# Patient Record
Sex: Male | Born: 1990
Health system: Southern US, Community
[De-identification: ages and names within clinical notes are randomized; demographics above are authoritative.]

## PROBLEM LIST (undated history)

## (undated) ENCOUNTER — Emergency Department (HOSPITAL_BASED_OUTPATIENT_CLINIC_OR_DEPARTMENT_OTHER): Payer: 59 | Source: Home / Self Care

## (undated) DIAGNOSIS — D571 Sickle-cell disease without crisis: Secondary | ICD-10-CM

## (undated) DIAGNOSIS — Z79891 Long term (current) use of opiate analgesic: Secondary | ICD-10-CM

## (undated) HISTORY — PX: CHOLECYSTECTOMY: SHX55

---
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
## 2009-09-14 ENCOUNTER — Emergency Department (HOSPITAL_BASED_OUTPATIENT_CLINIC_OR_DEPARTMENT_OTHER): Admission: EM | Admit: 2009-09-14 | Discharge: 2009-09-14 | Payer: Self-pay | Admitting: Emergency Medicine

## 2010-01-29 ENCOUNTER — Inpatient Hospital Stay (HOSPITAL_COMMUNITY): Admission: EM | Admit: 2010-01-29 | Discharge: 2010-02-05 | Payer: Self-pay | Admitting: Emergency Medicine

## 2010-01-29 ENCOUNTER — Ambulatory Visit: Payer: Self-pay | Admitting: Cardiology

## 2010-02-03 ENCOUNTER — Encounter (INDEPENDENT_AMBULATORY_CARE_PROVIDER_SITE_OTHER): Payer: Self-pay | Admitting: Internal Medicine

## 2010-07-08 ENCOUNTER — Emergency Department (HOSPITAL_BASED_OUTPATIENT_CLINIC_OR_DEPARTMENT_OTHER)
Admission: EM | Admit: 2010-07-08 | Discharge: 2010-07-08 | Payer: Self-pay | Source: Home / Self Care | Admitting: Emergency Medicine

## 2010-07-20 ENCOUNTER — Emergency Department (HOSPITAL_BASED_OUTPATIENT_CLINIC_OR_DEPARTMENT_OTHER)
Admission: EM | Admit: 2010-07-20 | Discharge: 2010-07-20 | Disposition: A | Discharge status: Home / Self Care | Payer: Self-pay | Attending: Emergency Medicine | Admitting: Emergency Medicine

## 2010-07-20 DIAGNOSIS — M79609 Pain in unspecified limb: Secondary | ICD-10-CM | POA: Insufficient documentation

## 2010-07-20 DIAGNOSIS — D5700 Hb-SS disease with crisis, unspecified: Secondary | ICD-10-CM | POA: Insufficient documentation

## 2010-07-20 LAB — RETICULOCYTES: Retic Ct Pct: 4.9 % — ABNORMAL HIGH (ref 0.4–3.1)

## 2010-07-20 LAB — DIFFERENTIAL
Eosinophils Absolute: 0 10*3/uL (ref 0.0–0.7)
Eosinophils Relative: 0 % (ref 0–5)
Lymphocytes Relative: 18 % (ref 12–46)
Lymphs Abs: 2.1 10*3/uL (ref 0.7–4.0)

## 2010-07-20 LAB — CBC
HCT: 33.8 % — ABNORMAL LOW (ref 39.0–52.0)
MCH: 27.7 pg (ref 26.0–34.0)
MCV: 76.6 fL — ABNORMAL LOW (ref 78.0–100.0)
RBC: 4.41 MIL/uL (ref 4.22–5.81)
RDW: 15.2 % (ref 11.5–15.5)

## 2010-07-20 LAB — BASIC METABOLIC PANEL
BUN: 4 mg/dL — ABNORMAL LOW (ref 6–23)
CO2: 24 mEq/L (ref 19–32)
Calcium: 9.5 mg/dL (ref 8.4–10.5)
Chloride: 106 mEq/L (ref 96–112)
Creatinine, Ser: 0.8 mg/dL (ref 0.4–1.5)
Potassium: 4.2 mEq/L (ref 3.5–5.1)
Sodium: 142 mEq/L (ref 135–145)

## 2010-08-27 LAB — TYPE AND SCREEN
ABO/RH(D): O POS
Donor AG Type: NEGATIVE

## 2010-08-27 LAB — CBC
HCT: 21.3 % — ABNORMAL LOW (ref 39.0–52.0)
HCT: 24.2 % — ABNORMAL LOW (ref 39.0–52.0)
HCT: 28.7 % — ABNORMAL LOW (ref 39.0–52.0)
HCT: 30.8 % — ABNORMAL LOW (ref 39.0–52.0)
Hemoglobin: 10.4 g/dL — ABNORMAL LOW (ref 13.0–17.0)
Hemoglobin: 11.3 g/dL — ABNORMAL LOW (ref 13.0–17.0)
Hemoglobin: 7.6 g/dL — ABNORMAL LOW (ref 13.0–17.0)
Hemoglobin: 8.7 g/dL — ABNORMAL LOW (ref 13.0–17.0)
Hemoglobin: 8.7 g/dL — ABNORMAL LOW (ref 13.0–17.0)
Hemoglobin: 9.7 g/dL — ABNORMAL LOW (ref 13.0–17.0)
MCH: 26.9 pg (ref 26.0–34.0)
MCH: 27.2 pg (ref 26.0–34.0)
MCH: 27.6 pg (ref 26.0–34.0)
MCH: 27.9 pg (ref 26.0–34.0)
MCHC: 35.7 g/dL (ref 30.0–36.0)
MCHC: 36.1 g/dL — ABNORMAL HIGH (ref 30.0–36.0)
MCHC: 36.2 g/dL — ABNORMAL HIGH (ref 30.0–36.0)
MCHC: 36.4 g/dL — ABNORMAL HIGH (ref 30.0–36.0)
MCHC: 36.4 g/dL — ABNORMAL HIGH (ref 30.0–36.0)
MCHC: 36.5 g/dL — ABNORMAL HIGH (ref 30.0–36.0)
MCV: 74.3 fL — ABNORMAL LOW (ref 78.0–100.0)
MCV: 76.5 fL — ABNORMAL LOW (ref 78.0–100.0)
Platelets: 114 10*3/uL — ABNORMAL LOW (ref 150–400)
Platelets: 122 10*3/uL — ABNORMAL LOW (ref 150–400)
Platelets: 162 10*3/uL (ref 150–400)
Platelets: 213 10*3/uL (ref 150–400)
Platelets: 261 10*3/uL (ref 150–400)
RBC: 3.17 MIL/uL — ABNORMAL LOW (ref 4.22–5.81)
RBC: 3.35 MIL/uL — ABNORMAL LOW (ref 4.22–5.81)
RBC: 3.75 MIL/uL — ABNORMAL LOW (ref 4.22–5.81)
RBC: 4.01 MIL/uL — ABNORMAL LOW (ref 4.22–5.81)
RBC: 4.1 MIL/uL — ABNORMAL LOW (ref 4.22–5.81)
RDW: 15.2 % (ref 11.5–15.5)
RDW: 15.6 % — ABNORMAL HIGH (ref 11.5–15.5)
WBC: 15.4 10*3/uL — ABNORMAL HIGH (ref 4.0–10.5)
WBC: 17.9 10*3/uL — ABNORMAL HIGH (ref 4.0–10.5)
WBC: 8.6 10*3/uL (ref 4.0–10.5)

## 2010-08-27 LAB — COMPREHENSIVE METABOLIC PANEL
ALT: 37 U/L (ref 0–53)
ALT: 42 U/L (ref 0–53)
ALT: 49 U/L (ref 0–53)
AST: 46 U/L — ABNORMAL HIGH (ref 0–37)
AST: 62 U/L — ABNORMAL HIGH (ref 0–37)
Albumin: 2.6 g/dL — ABNORMAL LOW (ref 3.5–5.2)
Albumin: 2.6 g/dL — ABNORMAL LOW (ref 3.5–5.2)
Albumin: 2.8 g/dL — ABNORMAL LOW (ref 3.5–5.2)
Alkaline Phosphatase: 141 U/L — ABNORMAL HIGH (ref 39–117)
Alkaline Phosphatase: 156 U/L — ABNORMAL HIGH (ref 39–117)
BUN: 4 mg/dL — ABNORMAL LOW (ref 6–23)
CO2: 26 mEq/L (ref 19–32)
CO2: 28 mEq/L (ref 19–32)
Calcium: 8.4 mg/dL (ref 8.4–10.5)
Calcium: 8.4 mg/dL (ref 8.4–10.5)
Calcium: 8.5 mg/dL (ref 8.4–10.5)
Chloride: 104 mEq/L (ref 96–112)
Chloride: 104 mEq/L (ref 96–112)
Creatinine, Ser: 0.76 mg/dL (ref 0.4–1.5)
Creatinine, Ser: 0.83 mg/dL (ref 0.4–1.5)
GFR calc Af Amer: >60 mL/min (ref >60)
GFR calc Af Amer: >60 mL/min (ref >60)
GFR calc non Af Amer: >60 mL/min (ref >60)
GFR calc non Af Amer: >60 mL/min (ref >60)
GFR calc non Af Amer: >60 mL/min (ref >60)
Glucose, Bld: 100 mg/dL — ABNORMAL HIGH (ref 70–99)
Glucose, Bld: 118 mg/dL — ABNORMAL HIGH (ref 70–99)
Glucose, Bld: 97 mg/dL (ref 70–99)
Potassium: 3.7 mEq/L (ref 3.5–5.1)
Sodium: 137 mEq/L (ref 135–145)
Sodium: 138 mEq/L (ref 135–145)
Sodium: 138 mEq/L (ref 135–145)
Total Bilirubin: 1.7 mg/dL — ABNORMAL HIGH (ref 0.3–1.2)
Total Bilirubin: 1.7 mg/dL — ABNORMAL HIGH (ref 0.3–1.2)
Total Protein: 6.3 g/dL (ref 6.0–8.3)

## 2010-08-27 LAB — URINE CULTURE
Colony Count: NO GROWTH
Culture  Setup Time: 201108202109
Culture: NO GROWTH

## 2010-08-27 LAB — BASIC METABOLIC PANEL
BUN: 5 mg/dL — ABNORMAL LOW (ref 6–23)
CO2: 25 mEq/L (ref 19–32)
Chloride: 106 mEq/L (ref 96–112)
Creatinine, Ser: 0.76 mg/dL (ref 0.4–1.5)
GFR calc Af Amer: >60 mL/min (ref >60)
Glucose, Bld: 113 mg/dL — ABNORMAL HIGH (ref 70–99)
Glucose, Bld: 113 mg/dL — ABNORMAL HIGH (ref 70–99)
Potassium: 3.9 mEq/L (ref 3.5–5.1)
Sodium: 133 mEq/L — ABNORMAL LOW (ref 135–145)
Sodium: 136 mEq/L (ref 135–145)
Sodium: 137 mEq/L (ref 135–145)

## 2010-08-27 LAB — CULTURE, BLOOD (ROUTINE X 2)
Culture: NO GROWTH
Culture: NO GROWTH

## 2010-08-27 LAB — RAPID URINE DRUG SCREEN, HOSP PERFORMED
Benzodiazepines: NOT DETECTED
Opiates: NOT DETECTED
Tetrahydrocannabinol: POSITIVE — AB

## 2010-08-27 LAB — CARDIAC PANEL(CRET KIN+CKTOT+MB+TROPI)
CK, MB: 0.4 ng/mL (ref 0.3–4.0)
CK, MB: 0.5 ng/mL (ref 0.3–4.0)
CK, MB: 0.6 ng/mL (ref 0.3–4.0)
Relative Index: INVALID (ref 0.0–2.5)
Total CK: 79 U/L (ref 7–232)
Troponin I: 0.05 ng/mL (ref 0.00–0.06)

## 2010-08-27 LAB — RETICULOCYTES
RBC.: 3.44 MIL/uL — ABNORMAL LOW (ref 4.22–5.81)
RBC.: 3.92 MIL/uL — ABNORMAL LOW (ref 4.22–5.81)
RBC.: 4.02 MIL/uL — ABNORMAL LOW (ref 4.22–5.81)
Retic Count, Absolute: 180 10*3/uL (ref 19.0–186.0)
Retic Count, Absolute: 188.9 10*3/uL — ABNORMAL HIGH (ref 19.0–186.0)
Retic Ct Pct: 4 % — ABNORMAL HIGH (ref 0.4–3.1)
Retic Ct Pct: 4.3 % — ABNORMAL HIGH (ref 0.4–3.1)
Retic Ct Pct: 5.7 % — ABNORMAL HIGH (ref 0.4–3.1)

## 2010-08-27 LAB — URINALYSIS, ROUTINE W REFLEX MICROSCOPIC
Glucose, UA: NEGATIVE mg/dL
Hgb urine dipstick: NEGATIVE
Urobilinogen, UA: 0.2 mg/dL (ref 0.0–1.0)
pH: 7 (ref 5.0–8.0)

## 2010-08-27 LAB — FERRITIN: Ferritin: 4510 ng/mL — ABNORMAL HIGH (ref 22–322)

## 2010-08-27 LAB — DIFFERENTIAL
Basophils Absolute: 0.1 10*3/uL (ref 0.0–0.1)
Basophils Relative: 1 % (ref 0–1)
Eosinophils Absolute: 0.2 10*3/uL (ref 0.0–0.7)
Lymphocytes Relative: 43 % (ref 12–46)
Monocytes Absolute: 1.3 10*3/uL — ABNORMAL HIGH (ref 0.1–1.0)
Monocytes Relative: 11 % (ref 3–12)

## 2010-08-27 LAB — IRON AND TIBC
Saturation Ratios: 23 % (ref 20–55)
TIBC: 180 ug/dL — ABNORMAL LOW (ref 215–435)

## 2010-08-27 LAB — BILIRUBIN, FRACTIONATED(TOT/DIR/INDIR)
Bilirubin, Direct: 0.5 mg/dL — ABNORMAL HIGH (ref 0.0–0.3)
Indirect Bilirubin: 1.1 mg/dL — ABNORMAL HIGH (ref 0.3–0.9)

## 2010-08-27 LAB — FOLATE: Folate: 9.3 ng/mL

## 2010-09-02 LAB — DIFFERENTIAL
Basophils Absolute: 0 10*3/uL (ref 0.0–0.1)
Lymphs Abs: 2.8 10*3/uL (ref 0.7–4.0)
Monocytes Absolute: 0.7 10*3/uL (ref 0.1–1.0)
Monocytes Relative: 10 % (ref 3–12)
Neutro Abs: 3.3 10*3/uL (ref 1.7–7.7)

## 2010-09-02 LAB — CBC
HCT: 37.1 % — ABNORMAL LOW (ref 39.0–52.0)
MCHC: 33.5 g/dL (ref 30.0–36.0)
MCV: 86.3 fL (ref 78.0–100.0)
Platelets: 142 10*3/uL — ABNORMAL LOW (ref 150–400)
WBC: 6.9 10*3/uL (ref 4.0–10.5)

## 2010-10-30 NOTE — Discharge Summary (Signed)
William Jennings, William Jennings                        ACCOUNT NO.:  [id number]   MEDICAL RECORD NO.:  [id number]                   PATIENT TYPE:  INP   LOCATION:  6153                                 FACILITY:  [facility]   PHYSICIAN:  Orie Rout, M.D.            DATE OF BIRTH:  [birth year]   DATE OF ADMISSION:  06/30/2003  DATE OF DISCHARGE:  07/09/2003                                 DISCHARGE SUMMARY   DISCHARGE DIAGNOSES:  1. Hemoglobin sickle cell disease.  2. Cervical lymphadenopathy.   DISCHARGE MEDICATIONS:  1. Augmentin 600 ES one p.o. t.i.d. for 10 days.  2. Motrin 400 mg one p.o. q.6h. p.r.n. pain.  3. Tylenol #3 one to two tablets p.o. q.4-6h. p.r.n. severe pain.  4. Hydroxyurea 750 mg one p.o. q.d.   DISPOSITION:  Discharged to home.   FOLLOW UP:  Followup will be at [facility] on Tuesday, February 1  at 3:30 p.m. and at [facility] on August 06, 2003 at 9:30  a.m.   PROCEDURE PERFORMED:  CT scan of the neck showing enlarged posterior chain  lymph nodes, significant thickening of the posterior nasopharyngeal wall  _________ and adenoids. Findings are nonspecific and could be related to  inflammation or lymphoma. He also had a chest x-ray which showed no evidence  for acute cardiopulmonary disease, typical sickle cell changes within the  vertebral bodies, and contrast in the renal collecting system secondary to  his prior CT scan. He also had a PPD placed which was read as negative  during this admission.   CONSULTATIONS:  Nutrition.   CHIEF COMPLAINT:  Lymphadenopathy.   HISTORY OF PRESENT ILLNESS:  This is a 20 year old African-American male  with hemoglobin Wayland disease presenting with a three-day history of swollen  cervical lymph nodes. In addition, the patient reports feeling much more  fatigued and normal for one week prior to admission, stating he would sleep  all day and had no energy which is a very unusual state for him. The  patient  denies any pain in his back or extremities. He also denies any respiratory  symptoms. The mother reports temperature to 100.3 but no other symptoms  other than his neck was tender and sore from the adenitis.   PAST MEDICAL HISTORY:  Hemoglobin Munson disease, last pain crisis approximately  one year prior.   PAST SURGICAL HISTORY:  Cholecystectomy in 2000.   FAMILY HISTORY:  Mom with sickle cell trait, sister with sickle cell  disease. Otherwise negative.   SOCIAL HISTORY:  Lives with mom and 52-year-old sister. No tobacco, no pets.  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   MEDICATIONS:  Hydroxyurea 750 mg q.d.   ALLERGIES:  No known drug allergies.   PHYSICAL EXAMINATION:  VITAL SIGNS:  Temperature is 98.4, blood pressure  109.62, heart rate 114, respiratory rate 20.  GENERAL:  Pleasant, appropriate, African-American male in no acute distress.  HEENT:  Normocephalic,  atraumatic. Anicteric sclerae. Noninjected  conjunctivae. TMs clear with a good light reflex bilaterally. OP with  tonsillar hypertrophy and erythema, no exudates. Mucous membranes moist.  NECK:  2 x 3 cm posterior cervical lymphadenopathy, bilaterally very tender,  does not want to move his neck due to tender lymphadenopathy __________ but  not consistent with meningitis.  CHEST:  Clear to auscultation bilaterally.  CARDIOVASCULAR:  Regular rate and rhythm.  ABDOMEN:  Splenomegaly. Spleen edge palpable 3 cm below the left costal  margin. No hepatomegaly. Soft, nontender, nondistended with normal active  bowel sounds.  GROIN:  Bilateral inguinal lymphadenopathy. Mobile nodes are noted.  EXTREMITIES:  Normal capillary refill. No clubbing, cyanosis, or edema. No  tenderness. 2+ DP pulses bilaterally.  SKIN:  No rashes noted.  MUSCULOSKELETAL:  No sternal, costovertebral, or CVA tenderness noted.   LABORATORY DATA:  Rapid strep is negative. Mono spot is negative. Retic  count is 3.6%. CBC:  White count  is 10, H and H 10 and 29.7, platelets 207,  neutrophils 66, lymphocytes 21, monocytes 11, absolute monocytes 1.1. He has  had a blood culture that was collected which was negative. A CMV and AVV  titer showing prior exposure but not current infection.   HOSPITAL COURSE:  Child was admitted for likely mononucleosis, although  after the results became clear that he did not currently have mononucleosis,  he was given pain control, and CT scan was performed. It was decided that a  biopsy should be done, but at the time that we brought this up to mother,  she remembered that on January 4 he had fairly extensive dental work  performed and had been fatigued and not quite himself since that point in  time. He was started on Unasyn three days prior to discharge and improved a  great deal. His lymphadenopathy had decreased a great deal to the point  where he had only shotty cervical lymph nodes in the posterior triangle. He  was changed to Augmentin and will be continued on this for 10 days after  discharge. A CBC the day prior to discharge showed a white count of 4.4, H  and H of 8.9 and 26.1, platelets of 237 with a normal differential. Other  labs have already been previously noted. The patient was discharged in  improved condition on a regular diet and was instructed to return to school  the day after his discharge.      Ursula Beath, MD                     Orie Rout, M.D.    JT/MEDQ  D:  07/09/2003  T:  07/10/2003  Job:  [id number]   cc:   Joycelyn Man, M.D.  [facility] Hematology/Oncology   [facility]

## 2010-10-30 NOTE — Discharge Summary (Signed)
NAMEEZRAEL, SAM NO.:  [id number]   MEDICAL RECORD NO.:  [id number]                   PATIENT TYPE:  INP   LOCATION:  6151                                 FACILITY:  [facility]   PHYSICIAN:  Rutherford Nail                          DATE OF BIRTH:  June 23, 1990   DATE OF ADMISSION:  04/02/2002  DATE OF DISCHARGE:  04/05/2002                                 DISCHARGE SUMMARY   FINAL DIAGNOSIS:  Sickle cell pain crisis.   HISTORY OF PRESENT ILLNESS:  The patient is a 20 year old African-American  male admitted with pain in his left leg.  The patient  states the pain is  similar to previous pain crises, but that this is the first time he has had  it on his left side.  His pain is normally in his right leg and hip. The  patient was initially tried on OxyContin in the ER, but stated that this did  not relieve his pain.  He was admitted for IV morphine treatment.   LABORATORY DATA:  The patient's initial CBC had a white count of 4.3,  hemoglobin 8.4, hematocrit 24.6, platelets 120.  The second hospital day his  white count had dropped to 2.5 and platelets were 97.  His reticulocyte  count was 2.5% .  On the third hospital day, the patient's white count had  improved to 3.7 and platelets had improved to 120.  The patient  had a blood  culture which was negative x48 hours at the time of discharge.   HOSPITAL COURSE BY SYSTEM:  1. Pain crisis.  The patient was initially started on IV morphine and then     advanced to a morphine PCA.  He was continued on this for two days and     then switched to oral Tylenol No. 3.  He was discharged home with     prescriptions for Percocet and Tylenol No. 3.  2. Hematology.  The patient  was noted to have leukopenia and     thrombocytopenia on hospital day two.  It was felt that this could be     possibly be due to his hydroxyurea.  However, recheck the next day showed     improvement of this so he was continued on his  hydroxyurea at discharge.   DISCHARGE MEDICATIONS:  1. Hydroxyurea 700 mg p.o. q.a.m.  2. Tylenol No. 3 15 ml p.o. q.4h p.r.n.  pain.  3. Percocet 325/5 one tablet p.o. q.4-6h p.r.n.  pain.   FOLLOW UP:  The patient is to follow up at [facility].  They are  to return to medical attention should his pain not be controlled or should  he have difficulty eating or drinking.  Rutherford Nail    LS/MEDQ  D:  04/06/2002  T:  04/08/2002  Job:  [id number]

## 2010-11-28 ENCOUNTER — Emergency Department (HOSPITAL_BASED_OUTPATIENT_CLINIC_OR_DEPARTMENT_OTHER)
Admission: EM | Admit: 2010-11-28 | Discharge: 2010-11-29 | Disposition: A | Discharge status: Home / Self Care | Payer: Self-pay | Attending: Emergency Medicine | Admitting: Emergency Medicine

## 2010-11-28 DIAGNOSIS — H9190 Unspecified hearing loss, unspecified ear: Secondary | ICD-10-CM | POA: Insufficient documentation

## 2010-11-28 DIAGNOSIS — D571 Sickle-cell disease without crisis: Secondary | ICD-10-CM | POA: Insufficient documentation

## 2011-03-11 LAB — CBC
HCT: 34.7 — ABNORMAL LOW
Hemoglobin: 11.6 — ABNORMAL LOW
MCHC: 33.4
Platelets: 229
RDW: 15.2

## 2011-03-11 LAB — DIFFERENTIAL
Basophils Relative: 0
Eosinophils Absolute: 0
Monocytes Absolute: 1.9 — ABNORMAL HIGH
Neutro Abs: 24 — ABNORMAL HIGH
Neutrophils Relative %: 87 — ABNORMAL HIGH

## 2011-04-26 ENCOUNTER — Encounter: Payer: Self-pay | Admitting: Family Medicine

## 2011-04-26 ENCOUNTER — Emergency Department (HOSPITAL_BASED_OUTPATIENT_CLINIC_OR_DEPARTMENT_OTHER)
Admission: EM | Admit: 2011-04-26 | Discharge: 2011-04-26 | Disposition: A | Discharge status: Home / Self Care | Payer: Self-pay | Attending: Emergency Medicine | Admitting: Emergency Medicine

## 2011-04-26 DIAGNOSIS — D5700 Hb-SS disease with crisis, unspecified: Secondary | ICD-10-CM | POA: Insufficient documentation

## 2011-04-26 DIAGNOSIS — M79609 Pain in unspecified limb: Secondary | ICD-10-CM | POA: Insufficient documentation

## 2011-04-26 HISTORY — DX: Sickle-cell disease without crisis: D57.1

## 2011-04-26 LAB — CBC
HCT: 34.4 % — ABNORMAL LOW (ref 39.0–52.0)
Hemoglobin: 12.9 g/dL — ABNORMAL LOW (ref 13.0–17.0)
MCH: 27.9 pg (ref 26.0–34.0)
MCHC: 37.5 g/dL — ABNORMAL HIGH (ref 30.0–36.0)
MCV: 74.3 fL — ABNORMAL LOW (ref 78.0–100.0)
Platelets: 161 10*3/uL (ref 150–400)
RBC: 4.63 MIL/uL (ref 4.22–5.81)
RDW: 14.4 % (ref 11.5–15.5)
WBC: 8.1 10*3/uL (ref 4.0–10.5)

## 2011-04-26 LAB — BASIC METABOLIC PANEL
BUN: 7 mg/dL (ref 6–23)
CO2: 26 mEq/L (ref 19–32)
Calcium: 9.8 mg/dL (ref 8.4–10.5)
Chloride: 104 mEq/L (ref 96–112)
Creatinine, Ser: 0.7 mg/dL (ref 0.50–1.35)
GFR calc Af Amer: >90 mL/min (ref >90)
GFR calc non Af Amer: >90 mL/min (ref >90)
Glucose, Bld: 110 mg/dL — ABNORMAL HIGH (ref 70–99)
Potassium: 4.1 mEq/L (ref 3.5–5.1)
Sodium: 138 mEq/L (ref 135–145)

## 2011-04-26 LAB — RETICULOCYTES
RBC.: 4.59 MIL/uL (ref 4.22–5.81)
Retic Count, Absolute: 211.1 10*3/uL — ABNORMAL HIGH (ref 19.0–186.0)
Retic Ct Pct: 4.6 % — ABNORMAL HIGH (ref 0.4–3.1)

## 2011-04-26 MED ORDER — HYDROMORPHONE HCL PF 1 MG/ML IJ SOLN
1.0000 mg | Freq: Once | INTRAMUSCULAR | Status: AC
  Administered 2011-04-26: 1 mg via INTRAVENOUS
  Filled 2011-04-26: qty 1

## 2011-04-26 MED ORDER — SODIUM CHLORIDE 0.9 % IV BOLUS (SEPSIS)
1000.0000 mL | Freq: Once | INTRAVENOUS | Status: AC
  Administered 2011-04-26: 1000 mL via INTRAVENOUS

## 2011-04-26 MED ORDER — OXYCODONE-ACETAMINOPHEN 5-325 MG PO TABS: 1.0000 | ORAL_TABLET | ORAL | Status: AC | PRN

## 2011-04-26 MED ORDER — KETOROLAC TROMETHAMINE 15 MG/ML IJ SOLN
15.0000 mg | Freq: Once | INTRAMUSCULAR | Status: AC
  Administered 2011-04-26: 15 mg via INTRAVENOUS
  Filled 2011-04-26: qty 1

## 2011-04-26 MED ORDER — ONDANSETRON HCL 4 MG/2ML IJ SOLN
4.0000 mg | Freq: Once | INTRAMUSCULAR | Status: AC
  Administered 2011-04-26: 4 mg via INTRAVENOUS
  Filled 2011-04-26: qty 2

## 2011-04-26 NOTE — ED Notes (Addendum)
Pt sts "I'm having a sickle cell crisis". Pt c/o pain to right leg mostly anterior x 4 days. Pt denies shob, n/v, fever. Pt sts he took ibuprofen at home without relief.

## 2011-04-30 NOTE — ED Provider Notes (Signed)
History    20yM with RLE pain which he attributes to ss crisis. No trauma. Gradual onset about 4d ago. R shin and thigh. No fever or chills. No n/v. No cp or sob. Has been taking ibuprofen at home without much relief.   CSN: [id number] Arrival date & time: 04/26/2011  8:21 AM   First MD Initiated Contact with Patient 04/26/11 0840      Chief Complaint  Patient presents with  . Sickle Cell Pain Crisis    (Consider location/radiation/quality/duration/timing/severity/associated sxs/prior treatment) HPI  Past Medical History  Diagnosis Date  . Sickle cell disease     History reviewed. No pertinent past surgical history.  No family history on file.  History  Substance Use Topics  . Smoking status: Never Smoker   . Smokeless tobacco: Not on file  . Alcohol Use: Yes      Review of Systems   Review of symptoms negative unless otherwise noted in HPI.  Allergies  Shellfish allergy  Home Medications   Current Outpatient Rx  Name Route Sig Dispense Refill  . IBUPROFEN 200 MG PO TABS Oral Take 600 mg by mouth every 6 (six) hours as needed. pain     . NAPROXEN SODIUM 220 MG PO TABS Oral Take 220 mg by mouth 2 (two) times daily with a meal. For pain     . OXYCODONE-ACETAMINOPHEN 5-325 MG PO TABS Oral Take 1 tablet by mouth every 4 (four) hours as needed for pain. 20 tablet 0    BP 128/69  Pulse 53  Temp(Src) 98 F (36.7 C) (Oral)  Resp 20  Ht 5\' 8"  (1.727 m)  Wt 145 lb (65.772 kg)  BMI 22.05 kg/m2  SpO2 100%  Physical Exam  Nursing note and vitals reviewed. Constitutional: He appears well-developed and well-nourished. No distress.  HENT:  Head: Normocephalic and atraumatic.  Eyes: Conjunctivae are normal. Right eye exhibits no discharge. Left eye exhibits no discharge.  Neck: Normal range of motion. Neck supple.  Cardiovascular: Normal rate, regular rhythm and normal heart sounds.  Exam reveals no gallop and no friction rub.   No murmur heard. Pulmonary/Chest:  Effort normal and breath sounds normal. No respiratory distress. He has no wheezes. He has no rales. He exhibits no tenderness.  Abdominal: Soft. He exhibits no distension. There is no tenderness.  Musculoskeletal: He exhibits no edema and no tenderness.       No rash. No significant tenderness of extremities.  Neurological: He is alert.  Skin: Skin is warm and dry.  Psychiatric: He has a normal mood and affect. His behavior is normal. Thought content normal.    ED Course  Procedures (including critical care time)  Labs Reviewed  CBC - Abnormal; Notable for the following:    Hemoglobin 12.9 (*)    HCT 34.4 (*)    MCV 74.3 (*)    MCHC 37.5 (*)    All other components within normal limits  RETICULOCYTES - Abnormal; Notable for the following:    Retic Ct Pct 4.6 (*)    Retic Count, Manual 211.1 (*)    All other components within normal limits  BASIC METABOLIC PANEL - Abnormal; Notable for the following:    Glucose, Bld 110 (*)    All other components within normal limits   No results found.   1. Sickle cell pain crisis       MDM  20yM with RLE pain. Pain improved with meds. Dc with script. No fever or chills. No clinical concern  for acute chanest. Pt clinically appears well. DC with outpt fu.        Raeford Razor, MD 05/02/11 [phone]

## 2011-05-07 ENCOUNTER — Inpatient Hospital Stay (HOSPITAL_BASED_OUTPATIENT_CLINIC_OR_DEPARTMENT_OTHER)
Admission: EM | Admit: 2011-05-07 | Discharge: 2011-05-13 | DRG: 193 | Disposition: A | Discharge status: Home / Self Care | Payer: Self-pay | Attending: Family Medicine | Admitting: Family Medicine

## 2011-05-07 ENCOUNTER — Encounter (HOSPITAL_BASED_OUTPATIENT_CLINIC_OR_DEPARTMENT_OTHER): Payer: Self-pay

## 2011-05-07 ENCOUNTER — Emergency Department (INDEPENDENT_AMBULATORY_CARE_PROVIDER_SITE_OTHER): Payer: Self-pay

## 2011-05-07 DIAGNOSIS — J189 Pneumonia, unspecified organism: Principal | ICD-10-CM | POA: Diagnosis present

## 2011-05-07 DIAGNOSIS — D5701 Hb-SS disease with acute chest syndrome: Secondary | ICD-10-CM | POA: Diagnosis present

## 2011-05-07 DIAGNOSIS — R5081 Fever presenting with conditions classified elsewhere: Secondary | ICD-10-CM

## 2011-05-07 DIAGNOSIS — D571 Sickle-cell disease without crisis: Secondary | ICD-10-CM

## 2011-05-07 DIAGNOSIS — R509 Fever, unspecified: Secondary | ICD-10-CM

## 2011-05-07 DIAGNOSIS — J069 Acute upper respiratory infection, unspecified: Secondary | ICD-10-CM

## 2011-05-07 DIAGNOSIS — R0902 Hypoxemia: Secondary | ICD-10-CM | POA: Diagnosis present

## 2011-05-07 DIAGNOSIS — D5700 Hb-SS disease with crisis, unspecified: Secondary | ICD-10-CM

## 2011-05-07 DIAGNOSIS — D57819 Other sickle-cell disorders with crisis, unspecified: Secondary | ICD-10-CM | POA: Diagnosis present

## 2011-05-07 LAB — CBC
MCH: 28 pg (ref 26.0–34.0)
MCHC: 37 g/dL — ABNORMAL HIGH (ref 30.0–36.0)
Platelets: 94 10*3/uL — ABNORMAL LOW (ref 150–400)
RBC: 4.07 MIL/uL — ABNORMAL LOW (ref 4.22–5.81)
RDW: 14 % (ref 11.5–15.5)

## 2011-05-07 LAB — BASIC METABOLIC PANEL
BUN: 9 mg/dL (ref 6–23)
CO2: 22 mEq/L (ref 19–32)
Chloride: 102 mEq/L (ref 96–112)
GFR calc non Af Amer: >90 mL/min (ref >90)
Glucose, Bld: 92 mg/dL (ref 70–99)
Potassium: 3.6 mEq/L (ref 3.5–5.1)

## 2011-05-07 LAB — RETICULOCYTES
RBC.: 4.08 MIL/uL — ABNORMAL LOW (ref 4.22–5.81)
Retic Count, Absolute: 114.2 10*3/uL (ref 19.0–186.0)

## 2011-05-07 LAB — DIFFERENTIAL
Basophils Absolute: 0 10*3/uL (ref 0.0–0.1)
Eosinophils Absolute: 0 10*3/uL (ref 0.0–0.7)
Monocytes Absolute: 1.1 10*3/uL — ABNORMAL HIGH (ref 0.1–1.0)
Neutrophils Relative %: 80 % — ABNORMAL HIGH (ref 43–77)

## 2011-05-07 MED ORDER — ONDANSETRON HCL 4 MG/2ML IJ SOLN: 4.0000 mg | Freq: Four times a day (QID) | INTRAMUSCULAR | Status: DC | PRN

## 2011-05-07 MED ORDER — DOCUSATE SODIUM 100 MG PO CAPS
100.0000 mg | ORAL_CAPSULE | Freq: Two times a day (BID) | ORAL | Status: DC
  Administered 2011-05-08 – 2011-05-12 (×8): 100 mg via ORAL
  Filled 2011-05-07 (×13): qty 1

## 2011-05-07 MED ORDER — ONDANSETRON HCL 4 MG PO TABS: 4.0000 mg | ORAL_TABLET | Freq: Four times a day (QID) | ORAL | Status: DC | PRN

## 2011-05-07 MED ORDER — HYDROCODONE-ACETAMINOPHEN 5-325 MG PO TABS: 1.0000 | ORAL_TABLET | ORAL | Status: DC | PRN

## 2011-05-07 MED ORDER — ENOXAPARIN SODIUM 40 MG/0.4ML ~~LOC~~ SOLN
40.0000 mg | Freq: Every day | SUBCUTANEOUS | Status: DC
  Filled 2011-05-07 (×6): qty 0.4

## 2011-05-07 MED ORDER — DEXTROSE 5 % IV SOLN: 1.0000 g | Freq: Once | INTRAVENOUS | Status: DC

## 2011-05-07 MED ORDER — DEXTROSE 5 % IV SOLN
500.0000 mg | INTRAVENOUS | Status: DC
  Administered 2011-05-08 – 2011-05-09 (×2): 500 mg via INTRAVENOUS
  Filled 2011-05-07 (×3): qty 500

## 2011-05-07 MED ORDER — HYDROMORPHONE HCL PF 1 MG/ML IJ SOLN
1.0000 mg | Freq: Once | INTRAMUSCULAR | Status: AC
  Administered 2011-05-07: 1 mg via INTRAVENOUS
  Filled 2011-05-07: qty 1

## 2011-05-07 MED ORDER — HYDROMORPHONE HCL PF 1 MG/ML IJ SOLN
2.0000 mg | Freq: Once | INTRAMUSCULAR | Status: AC
  Administered 2011-05-07: 2 mg via INTRAVENOUS
  Filled 2011-05-07: qty 2

## 2011-05-07 MED ORDER — SODIUM CHLORIDE 0.9 % IV SOLN
INTRAVENOUS | Status: DC
  Administered 2011-05-07: 500 mL via INTRAVENOUS
  Administered 2011-05-08 (×2): via INTRAVENOUS
  Administered 2011-05-08: 1000 mL via INTRAVENOUS
  Administered 2011-05-09 (×3): via INTRAVENOUS
  Administered 2011-05-10: 1000 mL via INTRAVENOUS
  Administered 2011-05-10 – 2011-05-11 (×2): via INTRAVENOUS

## 2011-05-07 MED ORDER — HYDROMORPHONE HCL PF 2 MG/ML IJ SOLN
2.0000 mg | Freq: Once | INTRAMUSCULAR | Status: AC
  Administered 2011-05-07: 2 mg via INTRAVENOUS
  Filled 2011-05-07: qty 1

## 2011-05-07 MED ORDER — ZOLPIDEM TARTRATE 5 MG PO TABS: 10.0000 mg | ORAL_TABLET | Freq: Every evening | ORAL | Status: DC | PRN

## 2011-05-07 MED ORDER — ACETAMINOPHEN 650 MG RE SUPP: 650.0000 mg | Freq: Four times a day (QID) | RECTAL | Status: DC | PRN

## 2011-05-07 MED ORDER — HYDROMORPHONE HCL PF 2 MG/ML IJ SOLN: 2.0000 mg | INTRAMUSCULAR | Status: DC | PRN

## 2011-05-07 MED ORDER — DEXTROSE 5 % IV SOLN
1.0000 g | INTRAVENOUS | Status: DC
  Administered 2011-05-08 – 2011-05-09 (×2): 1 g via INTRAVENOUS
  Filled 2011-05-07 (×3): qty 10

## 2011-05-07 MED ORDER — ACETAMINOPHEN 325 MG PO TABS
650.0000 mg | ORAL_TABLET | Freq: Once | ORAL | Status: AC
  Administered 2011-05-07: 650 mg via ORAL
  Filled 2011-05-07: qty 2

## 2011-05-07 MED ORDER — DIPHENHYDRAMINE HCL 50 MG/ML IJ SOLN
25.0000 mg | Freq: Once | INTRAMUSCULAR | Status: AC
  Administered 2011-05-07: 25 mg via INTRAVENOUS
  Filled 2011-05-07: qty 1

## 2011-05-07 MED ORDER — DEXTROSE 5 % IV SOLN: 500.0000 mg | Freq: Once | INTRAVENOUS | Status: DC

## 2011-05-07 MED ORDER — DEXTROSE 5 % IV SOLN
2.0000 g | Freq: Once | INTRAVENOUS | Status: AC
  Administered 2011-05-07: 2 g via INTRAVENOUS
  Filled 2011-05-07: qty 2

## 2011-05-07 MED ORDER — DEXTROSE 5 % IV SOLN
500.0000 mg | Freq: Once | INTRAVENOUS | Status: AC
  Administered 2011-05-07: 500 mg via INTRAVENOUS
  Filled 2011-05-07: qty 500

## 2011-05-07 MED ORDER — SODIUM CHLORIDE 0.9 % IV BOLUS (SEPSIS)
1000.0000 mL | Freq: Once | INTRAVENOUS | Status: AC
  Administered 2011-05-07: 1000 mL via INTRAVENOUS

## 2011-05-07 MED ORDER — HYDROMORPHONE HCL PF 1 MG/ML IJ SOLN
1.0000 mg | INTRAMUSCULAR | Status: DC | PRN
  Administered 2011-05-07: 1 mg via INTRAVENOUS
  Administered 2011-05-08 (×2): 2 mg via INTRAVENOUS
  Administered 2011-05-08 (×2): 1 mg via INTRAVENOUS
  Filled 2011-05-07 (×2): qty 1
  Filled 2011-05-07: qty 2
  Filled 2011-05-07: qty 1
  Filled 2011-05-07: qty 2
  Filled 2011-05-07: qty 1

## 2011-05-07 MED ORDER — ACETAMINOPHEN 325 MG PO TABS
650.0000 mg | ORAL_TABLET | Freq: Four times a day (QID) | ORAL | Status: DC | PRN
  Administered 2011-05-08: 650 mg via ORAL
  Filled 2011-05-07: qty 2

## 2011-05-07 NOTE — ED Notes (Signed)
Fever, back pain, runny nose, cough x 2 days

## 2011-05-07 NOTE — ED Notes (Addendum)
Contacted [phone] and was informed that a bed assignment had been made for this patient.  [facility] notified that transport can be performed at this time.  Message was left for Feliz Beam, RN at 5500 to call for report when he was ready.

## 2011-05-07 NOTE — ED Provider Notes (Signed)
History     CSN: [id number] Arrival date & time: 05/07/2011  4:35 PM   First MD Initiated Contact with Patient 05/07/11 1651      Chief Complaint  Patient presents with  . Fever  . URI  . Sickle Cell Pain Crisis    (Consider location/radiation/quality/duration/timing/severity/associated sxs/prior treatment) HPI  Past Medical History  Diagnosis Date  . Sickle cell disease     Past Surgical History  Procedure Date  . Cholecystectomy     No family history on file.  History  Substance Use Topics  . Smoking status: Never Smoker   . Smokeless tobacco: Not on file  . Alcohol Use: No      Review of Systems  Allergies  Shellfish allergy  Home Medications   Current Outpatient Rx  Name Route Sig Dispense Refill  . PSEUDOEPH-DOXYLAMINE-DM-APAP 60-7.11-10-998 MG/30ML PO LIQD Oral Take 30 mLs by mouth every 6 (six) hours as needed. For congestion     . IBUPROFEN 200 MG PO TABS Oral Take 600 mg by mouth every 6 (six) hours as needed. pain     . NAPROXEN SODIUM 220 MG PO TABS Oral Take 220 mg by mouth 2 (two) times daily with a meal. For pain     . OXYCODONE-ACETAMINOPHEN 5-325 MG PO TABS Oral Take 1 tablet by mouth every 4 (four) hours as needed for pain. 20 tablet 0    BP 94/48  Pulse 100  Temp(Src) 100.9 F (38.3 C) (Oral)  Resp 17  Ht 5\' 8"  (1.727 m)  Wt 145 lb (65.772 kg)  BMI 22.05 kg/m2  SpO2 96%  Physical Exam  ED Course  Procedures (including critical care time)  Labs Reviewed  CBC - Abnormal; Notable for the following:    RBC 4.07 (*)    Hemoglobin 11.4 (*)    HCT 30.8 (*)    MCV 75.7 (*)    MCHC 37.0 (*)    Platelets 94 (*)    All other components within normal limits  DIFFERENTIAL - Abnormal; Notable for the following:    Neutrophils Relative 80 (*)    Lymphocytes Relative 9 (*)    Monocytes Absolute 1.1 (*)    All other components within normal limits  RETICULOCYTES - Abnormal; Notable for the following:    RBC. 4.08 (*)    All other  components within normal limits  BASIC METABOLIC PANEL  CULTURE, BLOOD (ROUTINE X 2)  CULTURE, BLOOD (ROUTINE X 2)   Dg Chest 2 View  05/07/2011  *RADIOLOGY REPORT*  Clinical Data: Sickle cell disease.  Fever.  Upper respiratory infection.  CHEST - 2 VIEW  Comparison: 07/08/2010.  Findings: Increased density is present in the right lower lobe, with increasing density extending inferiorly over the thoracic spine.  This is compatible with right lower lobe airspace disease/pneumonia.  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  Cardiopericardial silhouette is within normal limits.  IMPRESSION:  1.  Patchy right lower lobe airspace disease suspicious for pneumonia. 2.  Bony sickle cell changes.  Original Report Authenticated By: Andreas Newport, M.D.     1. Acute chest syndrome   2. Sickle cell crisis   3. Fever       MDM  Fever, ?sickle cell crisis. Reticulocyte count is not elevated. The patient does have RLL pneumonia. Will treat as community acquired with ceftriaxone and azithromycin.  ?acute chest?. D/W [facility] hospitalist Dr. Nedra Hai. Pt to be admitted to [facility] bed, [facility] Team  3, Dr. Ardyth Harps. Continue pain control.  Stefano Gaul, MD         Forbes Cellar, MD 05/07/11 2004

## 2011-05-07 NOTE — ED Notes (Signed)
Spoke with Dr Hyman Hopes who ordered blood cultures after the infusion of IV antibiotics.  She states that she mistakenly forgot to place the order but would like to still obtain blood cultures even though the IV antibiotics have begun infusing.

## 2011-05-07 NOTE — H&P (Signed)
PCP:   No primary provider on file.   Chief Complaint: Sickle cell crisis and pneumonia.  HPI: William Jennings is an 20 y.o. male with history of sickle cell disease, prior acute chest syndrome, and unfortunately an active tobacco abuse, presents to [facility] with complaint of shortness of breath, chest and back pain, subjective fever, and  coughs for the past 2 days.   Workup at the [facility] revealed an infiltrate on his right chest by x-ray.  He has a fever up to 103 but with normal white count and no left shift.  His hemoglobin is 11.4 g per decaliter.  Many years ago, he was taken Hydrea, but it was stopped because he has produced adequate fetal hemoglobin (this is what he was told).  For pain control, he is only on Percocet as needed along with ibuprofen. He has not found a PCP as yet, because he didn't have any insurance, but he will have it shortly.  Rewiew of Systems:  The patient denies anorexia, fever, weight loss,, vision loss, decreased hearing, hoarseness,  syncope, dyspnea on exertion, peripheral edema, balance deficits, hemoptysis, abdominal pain, melena, hematochezia, severe indigestion/heartburn, hematuria, incontinence, genital sores, muscle weakness, suspicious skin lesions, transient blindness, difficulty walking, depression, unusual weight change, abnormal bleeding, enlarged lymph nodes, angioedema, and breast masses.    Past Medical History  Diagnosis Date  . Sickle cell disease     Past Surgical History  Procedure Date  . Cholecystectomy     Medications:  HOME MEDS: Prior to Admission medications   Medication Sig Start Date End Date Taking? Authorizing Provider  Pseudoeph-Doxylamine-DM-APAP (NYQUIL) 60-7.11-10-998 MG/30ML LIQD Take 30 mLs by mouth every 6 (six) hours as needed. For congestion    Yes Historical Provider, MD  ibuprofen (ADVIL,MOTRIN) 200 MG tablet Take 600 mg by mouth every 6 (six) hours as needed. pain     Historical  Provider, MD  naproxen sodium (ANAPROX) 220 MG tablet Take 220 mg by mouth 2 (two) times daily with a meal. For pain     Historical Provider, MD  oxyCODONE-acetaminophen (PERCOCET) 5-325 MG per tablet Take 1 tablet by mouth every 4 (four) hours as needed for pain. 04/26/11 05/06/11  Raeford Razor, MD     Allergies:  Allergies  Allergen Reactions  . Shellfish Allergy Swelling    Social History:   reports that he has never smoked. He does not have any smokeless tobacco history on file. He reports that he does not drink alcohol or use illicit drugs.  He is married, his wife is expecting. 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 No pertinent family history.    Physical Exam: Filed Vitals:   05/07/11 1725 05/07/11 1936 05/07/11 2107 05/07/11 2240  BP:  94/48 96/56 94/56   Pulse: 96 100 96 100  Temp: 100.9 F (38.3 C)   99.1 F (37.3 C)  TempSrc: Oral   Oral  Resp: 20 17 19 18   Height:      Weight:      SpO2:  96% 97% 98%   Blood pressure 94/56, pulse 100, temperature 99.1 F (37.3 C), temperature source Oral, resp. rate 18, height 5\' 8"  (1.727 m), weight 65.772 kg (145 lb), SpO2 98.00%.  GEN:  Pleasant  person lying in the stretcher in no acute distress; cooperative with exam PSYCH:  alert and oriented x4; does not appear anxious does not appear depressed; affect is normal HEENT: Mucous  membranes pink and anicteric; PERRLA; EOM intact; no cervical lymphadenopathy nor thyromegaly or carotid bruit; no JVD; Breasts:: Not examined CHEST WALL: No tenderness CHEST: Normal respiration, there is a right-sided crackle to auscultation. No wheezing HEART: Regular rate and rhythm; no murmurs rubs or gallops BACK: No kyphosis or scoliosis; no CVA tenderness ABDOMEN: Obese, soft non-tender; no masses, no organomegaly, normal abdominal bowel sounds; no pannus; no intertriginous candida. Rectal Exam: Not done EXTREMITIES: No bone or joint deformity;  age-appropriate arthropathy of the hands and knees; no edema; no ulcerations. Genitalia: not examined PULSES: 2+ and symmetric SKIN: Normal hydration no rash or ulceration CNS: Cranial nerves 2-12 grossly intact no focal neurologic deficit   Labs & Imaging Results for orders placed during the hospital encounter of 05/07/11 (from the past 48 hour(s))  CBC     Status: Abnormal   Collection Time   05/07/11  5:45 PM      Component Value Range Comment   WBC 9.7  4.0 - 10.5 (K/uL)    RBC 4.07 (*) 4.22 - 5.81 (MIL/uL)    Hemoglobin 11.4 (*) 13.0 - 17.0 (g/dL)    HCT 16.1 (*) 09.6 - 52.0 (%)    MCV 75.7 (*) 78.0 - 100.0 (fL)    MCH 28.0  26.0 - 34.0 (pg)    MCHC 37.0 (*) 30.0 - 36.0 (g/dL)    RDW 04.5  40.9 - 81.1 (%)    Platelets 94 (*) 150 - 400 (K/uL)   DIFFERENTIAL     Status: Abnormal   Collection Time   05/07/11  5:45 PM      Component Value Range Comment   Neutrophils Relative 80 (*) 43 - 77 (%)    Lymphocytes Relative 9 (*) 12 - 46 (%)    Monocytes Relative 11  3 - 12 (%)    Eosinophils Relative 0  0 - 5 (%)    Basophils Relative 0  0 - 1 (%)    Neutro Abs 7.7  1.7 - 7.7 (K/uL)    Lymphs Abs 0.9  0.7 - 4.0 (K/uL)    Monocytes Absolute 1.1 (*) 0.1 - 1.0 (K/uL)    Eosinophils Absolute 0.0  0.0 - 0.7 (K/uL)    Basophils Absolute 0.0  0.0 - 0.1 (K/uL)    RBC Morphology TARGET CELLS      Smear Review PLATELET COUNT CONFIRMED BY SMEAR     BASIC METABOLIC PANEL     Status: Normal   Collection Time   05/07/11  5:45 PM      Component Value Range Comment   Sodium 136  135 - 145 (mEq/L)    Potassium 3.6  3.5 - 5.1 (mEq/L)    Chloride 102  96 - 112 (mEq/L)    CO2 22  19 - 32 (mEq/L)    Glucose, Bld 92  70 - 99 (mg/dL)    BUN 9  6 - 23 (mg/dL)    Creatinine, Ser 9.14  0.50 - 1.35 (mg/dL)    Calcium 9.0  8.4 - 10.5 (mg/dL)    GFR calc non Af Amer >90  >90 (mL/min)    GFR calc Af Amer >90  >90 (mL/min)   RETICULOCYTES     Status: Abnormal   Collection Time   05/07/11  5:45 PM       Component Value Range Comment   Retic Ct Pct 2.8  0.4 - 3.1 (%)    RBC. 4.08 (*) 4.22 - 5.81 (MIL/uL)    Retic Count,  Manual 114.2  19.0 - 186.0 (K/uL)    Dg Chest 2 View  05/07/2011  *RADIOLOGY REPORT*  Clinical Data: Sickle cell disease.  Fever.  Upper respiratory infection.  CHEST - 2 VIEW  Comparison: 07/08/2010.  Findings: Increased density is present in the right lower lobe, with increasing density extending inferiorly over the thoracic spine.  This is compatible with right lower lobe airspace disease/pneumonia.  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  Cardiopericardial silhouette is within normal limits.  IMPRESSION:  1.  Patchy right lower lobe airspace disease suspicious for pneumonia. 2.  Bony sickle cell changes.  Original Report Authenticated By: Andreas Newport, M.D.      Assessment Present on Admission:  .PNA (pneumonia) Sickle cell crisis  PLAN:  William Jennings has sickle cell crisis and is having any community-acquired pneumonia.  He was given Rocephin and Zithromax at [facility] and I will continue these antibiotics.   Will give him pain medication along with IV fluids.  He is stable otherwise and will be admitted to general medical floor.  Other plans as per orders.    Marshon Bangs 05/07/2011, 11:24 PM

## 2011-05-08 ENCOUNTER — Encounter (HOSPITAL_COMMUNITY): Payer: Self-pay

## 2011-05-08 DIAGNOSIS — D5701 Hb-SS disease with acute chest syndrome: Secondary | ICD-10-CM | POA: Diagnosis present

## 2011-05-08 LAB — BASIC METABOLIC PANEL
BUN: 6 mg/dL (ref 6–23)
Chloride: 104 mEq/L (ref 96–112)
GFR calc Af Amer: >90 mL/min (ref >90)
GFR calc non Af Amer: >90 mL/min (ref >90)
Potassium: 3.6 mEq/L (ref 3.5–5.1)
Sodium: 135 mEq/L (ref 135–145)

## 2011-05-08 LAB — CBC
HCT: 27.9 % — ABNORMAL LOW (ref 39.0–52.0)
HCT: 28.6 % — ABNORMAL LOW (ref 39.0–52.0)
Hemoglobin: 10.1 g/dL — ABNORMAL LOW (ref 13.0–17.0)
Hemoglobin: 10.4 g/dL — ABNORMAL LOW (ref 13.0–17.0)
MCHC: 36.2 g/dL — ABNORMAL HIGH (ref 30.0–36.0)
MCHC: 36.4 g/dL — ABNORMAL HIGH (ref 30.0–36.0)
RBC: 3.62 MIL/uL — ABNORMAL LOW (ref 4.22–5.81)
RBC: 3.73 MIL/uL — ABNORMAL LOW (ref 4.22–5.81)
WBC: 6.9 10*3/uL (ref 4.0–10.5)

## 2011-05-08 LAB — CREATININE, SERUM: GFR calc non Af Amer: >90 mL/min (ref >90)

## 2011-05-08 MED ORDER — NALOXONE HCL 0.4 MG/ML IJ SOLN: 0.4000 mg | INTRAMUSCULAR | Status: DC | PRN

## 2011-05-08 MED ORDER — SODIUM CHLORIDE 0.9 % IV SOLN
INTRAVENOUS | Status: AC
  Administered 2011-05-08: 17:00:00 via INTRAVENOUS

## 2011-05-08 MED ORDER — DIPHENHYDRAMINE HCL 12.5 MG/5ML PO ELIX
12.5000 mg | ORAL_SOLUTION | Freq: Four times a day (QID) | ORAL | Status: DC | PRN
  Filled 2011-05-08: qty 5

## 2011-05-08 MED ORDER — SODIUM CHLORIDE 0.9 % IJ SOLN: 9.0000 mL | INTRAMUSCULAR | Status: DC | PRN

## 2011-05-08 MED ORDER — DIPHENHYDRAMINE HCL 50 MG/ML IJ SOLN
12.5000 mg | Freq: Four times a day (QID) | INTRAMUSCULAR | Status: DC | PRN
  Administered 2011-05-10 – 2011-05-11 (×4): 12.5 mg via INTRAVENOUS
  Filled 2011-05-08 (×4): qty 1

## 2011-05-08 MED ORDER — WHITE PETROLATUM GEL
Status: AC
  Administered 2011-05-08: 20:00:00
  Filled 2011-05-08: qty 5

## 2011-05-08 MED ORDER — HYDROMORPHONE 0.3 MG/ML IV SOLN
INTRAVENOUS | Status: DC
  Administered 2011-05-08: 1.1 mg via INTRAVENOUS
  Administered 2011-05-08 – 2011-05-09 (×2): 7.5 mg via INTRAVENOUS
  Administered 2011-05-09: 0.6 mg via INTRAVENOUS
  Administered 2011-05-09: 0 mg via INTRAVENOUS
  Administered 2011-05-09: 2.92 mg via INTRAVENOUS
  Administered 2011-05-09: 4.5 mg via INTRAVENOUS
  Administered 2011-05-09: 1.2 mg via INTRAVENOUS
  Administered 2011-05-09: 3.43 mg via INTRAVENOUS
  Administered 2011-05-10: 3.6 mg via INTRAVENOUS
  Administered 2011-05-10: 7.5 mg via INTRAVENOUS
  Administered 2011-05-10: 4.5 mg via INTRAVENOUS
  Administered 2011-05-10: 1.2 mg via INTRAVENOUS
  Filled 2011-05-08 (×3): qty 25

## 2011-05-08 NOTE — Progress Notes (Signed)
Subjective: Still complains of chest pain, back pain, shortness of breath. He is using 2 mg of Dilaudid every 2 hours.  Objective: Vital signs in last 24 hours: Temp:  [99.1 F (37.3 C)-103.1 F (39.5 C)] 99.3 F (37.4 C) (11/24 0413) Pulse Rate:  [95-119] 95  (11/24 0413) Resp:  [17-20] 18  (11/24 0413) BP: (94-104)/(48-66) 103/66 mmHg (11/24 0413) SpO2:  [92 %-100 %] 92 % (11/24 0413) Weight:  [65 kg (143 lb 4.8 oz)-65.772 kg (145 lb)] 143 lb 4.8 oz (65 kg) (11/24 0050) Weight change:  Last BM Date: 05/05/11  Intake/Output from previous day: 11/23 0701 - 11/24 0700 In: 772.9 [I.V.:772.9] Out: -      Physical Exam: General: Alert, awake, oriented x3, HEENT: No bruits, no goiter. Heart: Regular rate and rhythm, without murmurs, rubs, gallops. Lungs: Right lower crackles. Abdomen: Soft, nontender, nondistended, positive bowel sounds. Extremities: No clubbing cyanosis or edema with positive pedal pulses. Neuro: Grossly intact, nonfocal.    Lab Results: Basic Metabolic Panel:  Basename 05/08/11 0600 05/08/11 0020 05/07/11 1745  NA 135 -- 136  K 3.6 -- 3.6  CL 104 -- 102  CO2 23 -- 22  GLUCOSE 96 -- 92  BUN 6 -- 9  CREATININE 0.84 0.81 --  CALCIUM 8.4 -- 9.0  MG -- -- --  PHOS -- -- --   CBC:  Basename 05/08/11 0600 05/08/11 0020 05/07/11 1745  WBC 6.9 7.1 --  NEUTROABS -- -- 7.7  HGB 10.4* 10.1* --  HCT 28.6* 27.9* --  MCV 76.7* 77.1* --  PLT 72* 73* --   Anemia Panel:  Basename 05/07/11 1745  VITAMINB12 --  FOLATE --  FERRITIN --  TIBC --  IRON --  RETICCTPCT 2.8    Urine Drug Screen: Drugs of Abuse     Component Value Date/Time   LABOPIA NONE DETECTED 01/29/2010 2147   COCAINSCRNUR NONE DETECTED 01/29/2010 2147   LABBENZ NONE DETECTED 01/29/2010 2147   AMPHETMU NONE DETECTED 01/29/2010 2147   THCU POSITIVE* 01/29/2010 2147   LABBARB  Value: NONE DETECTED        DRUG SCREEN FOR MEDICAL PURPOSES ONLY.  IF CONFIRMATION IS NEEDED FOR ANY PURPOSE,  NOTIFY LAB WITHIN 5 DAYS.        LOWEST DETECTABLE LIMITS FOR URINE DRUG SCREEN Drug Class       Cutoff (ng/mL) Amphetamine      1000 Barbiturate      200 Benzodiazepine   200 Tricyclics       300 Opiates          300 Cocaine          300 THC              50 01/29/2010 2147     Studies/Results: Dg Chest 2 View  05/07/2011  *RADIOLOGY REPORT*  Clinical Data: Sickle cell disease.  Fever.  Upper respiratory infection.  CHEST - 2 VIEW  Comparison: 07/08/2010.  Findings: Increased density is present in the right lower lobe, with increasing density extending inferiorly over the thoracic spine.  This is compatible with right lower lobe airspace disease/pneumonia.  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  Cardiopericardial silhouette is within normal limits.  IMPRESSION:  1.  Patchy right lower lobe airspace disease suspicious for pneumonia. 2.  Bony sickle cell changes.  Original Report Authenticated By: Andreas Newport, M.D.    Medications: Scheduled Meds:   . acetaminophen  650 mg Oral  Once  . azithromycin  500 mg Intravenous Once  . azithromycin  500 mg Intravenous Q24H  . cefTRIAXone (ROCEPHIN) IV  1 g Intravenous Q24H  . cefTRIAXone (ROCEPHIN) IV  2 g Intravenous Once  . diphenhydrAMINE  25 mg Intravenous Once  . docusate sodium  100 mg Oral BID  . enoxaparin  40 mg Subcutaneous QHS  .  HYDROmorphone (DILAUDID) injection  1 mg Intravenous Once  . HYDROmorphone  2 mg Intravenous Once  .  HYDROmorphone (DILAUDID) injection  2 mg Intravenous Once  . HYDROmorphone PCA 0.3 mg/mL   Intravenous Q4H  . sodium chloride  1,000 mL Intravenous Once  . DISCONTD: azithromycin  500 mg Intravenous Once  . DISCONTD: cefTRIAXone (ROCEPHIN) IV  1 g Intravenous Once   Continuous Infusions:   . 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 He still having significant pain and fever, I will go ahead and perform an exchange transfusion to try to reduce the sickled load. We'll remove 500 cc of blood followed by 1 unit PRBC transfusion. He is requiring high amounts of narcotics, I will switch him over to a PCA Dilaudid pump.   LOS: 1 day   HERNANDEZ ACOSTA,ESTELA 05/08/2011, 10:11 AM

## 2011-05-09 LAB — TYPE AND SCREEN
ABO/RH(D): O POS
Antibody Screen: NEGATIVE
Unit division: 0

## 2011-05-09 LAB — CBC
HCT: 31.2 % — ABNORMAL LOW (ref 39.0–52.0)
Hemoglobin: 11.4 g/dL — ABNORMAL LOW (ref 13.0–17.0)
MCH: 27.9 pg (ref 26.0–34.0)
RBC: 4.08 MIL/uL — ABNORMAL LOW (ref 4.22–5.81)

## 2011-05-09 NOTE — Progress Notes (Signed)
Subjective: Respiratory symptoms are improved. Patient had a restless night secondary to beeping of continuous pulse ox machine, this morning is complaining of increased back pain.  Objective: Vital signs in last 24 hours: Temp:  [97.8 F (36.6 C)-99.5 F (37.5 C)] 98 F (36.7 C) (11/25 0520) Pulse Rate:  [75-93] 86  (11/25 0520) Resp:  [17-24] 17  (11/25 0520) BP: (99-125)/(57-74) 119/69 mmHg (11/25 0520) SpO2:  [97 %-100 %] 97 % (11/25 0520) Weight:  [66 kg (145 lb 8.1 oz)] 145 lb 8.1 oz (66 kg) (11/25 0520) Weight change: 0.228 kg (8.1 oz) Last BM Date: 05/06/11  Intake/Output from previous day: 11/24 0701 - 11/25 0700 In: 4578.3 [P.O.:360; I.V.:3437; Blood:481.3; IV Piggyback:300] Out: 1975 [Urine:1975]     Physical Exam: General: Alert, awake, oriented x3. HEENT: No bruits, no goiter. Heart: Regular rate and rhythm, without murmurs, rubs, gallops. Lungs: Clear to auscultation bilaterally. Abdomen: Soft, nontender, nondistended, positive bowel sounds. Extremities: No clubbing cyanosis or edema with positive pedal pulses. Neuro: Grossly intact, nonfocal.    Lab Results: Basic Metabolic Panel:  Basename 05/08/11 0600 05/08/11 0020 05/07/11 1745  NA 135 -- 136  K 3.6 -- 3.6  CL 104 -- 102  CO2 23 -- 22  GLUCOSE 96 -- 92  BUN 6 -- 9  CREATININE 0.84 0.81 --  CALCIUM 8.4 -- 9.0  MG -- -- --  PHOS -- -- --   CBC:  Basename 05/08/11 0600 05/08/11 0020 05/07/11 1745  WBC 6.9 7.1 --  NEUTROABS -- -- 7.7  HGB 10.4* 10.1* --  HCT 28.6* 27.9* --  MCV 76.7* 77.1* --  PLT 72* 73* --   Anemia Panel:  Basename 05/07/11 1745  VITAMINB12 --  FOLATE --  FERRITIN --  TIBC --  IRON --  RETICCTPCT 2.8   Drugs of Abuse     Component Value Date/Time   LABOPIA NONE DETECTED 01/29/2010 2147   COCAINSCRNUR NONE DETECTED 01/29/2010 2147   LABBENZ NONE DETECTED 01/29/2010 2147   AMPHETMU NONE DETECTED 01/29/2010 2147   THCU POSITIVE* 01/29/2010 2147   LABBARB  Value:  NONE DETECTED        DRUG SCREEN FOR MEDICAL PURPOSES ONLY.  IF CONFIRMATION IS NEEDED FOR ANY PURPOSE, NOTIFY LAB WITHIN 5 DAYS.        LOWEST DETECTABLE LIMITS FOR URINE DRUG SCREEN Drug Class       Cutoff (ng/mL) Amphetamine      1000 Barbiturate      200 Benzodiazepine   200 Tricyclics       300 Opiates          300 Cocaine          300 THC              50 01/29/2010 2147    A Studies/Results: Dg Chest 2 View  05/07/2011  *RADIOLOGY REPORT*  Clinical Data: Sickle cell disease.  Fever.  Upper respiratory infection.  CHEST - 2 VIEW  Comparison: 07/08/2010.  Findings: Increased density is present in the right lower lobe, with increasing density extending inferiorly over the thoracic spine.  This is compatible with right lower lobe airspace disease/pneumonia.  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  Cardiopericardial silhouette is within normal limits.  IMPRESSION:  1.  Patchy right lower lobe airspace disease suspicious for pneumonia. 2.  Bony sickle cell changes.  Original Report Authenticated By: Andreas Newport, M.D.    Medications: Scheduled Meds:   .  azithromycin  500 mg Intravenous Q24H  . cefTRIAXone (ROCEPHIN) IV  1 g Intravenous Q24H  . docusate sodium  100 mg Oral BID  . enoxaparin  40 mg Subcutaneous QHS  . HYDROmorphone PCA 0.3 mg/mL   Intravenous Q4H  . white petrolatum       Continuous Infusions:   . sodium chloride 125 mL/hr at 05/09/11 0704  . 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 Is not complaining of chest pain  or shortness of breath. He is no longer hypoxic. He did receive an exchange transfusion yesterday. Will continue Rocephin and azithromycin for one more day, if continues to be afebrile and with good O2 sats we'll transition over to by mouth Avelox in the morning for potential discharge home soon.  #2 painful vaso-occlusive sickle cell crisis: Continue PCA Dilaudid.   LOS: 2 days   William Jennings,William Jennings 05/09/2011, 8:59 AM

## 2011-05-10 LAB — BASIC METABOLIC PANEL
BUN: 5 mg/dL — ABNORMAL LOW (ref 6–23)
CO2: 25 mEq/L (ref 19–32)
Glucose, Bld: 92 mg/dL (ref 70–99)
Potassium: 3.7 mEq/L (ref 3.5–5.1)
Sodium: 137 mEq/L (ref 135–145)

## 2011-05-10 LAB — CBC
Hemoglobin: 10.6 g/dL — ABNORMAL LOW (ref 13.0–17.0)
MCH: 26.4 pg (ref 26.0–34.0)
MCHC: 34.6 g/dL (ref 30.0–36.0)
MCV: 76.1 fL — ABNORMAL LOW (ref 78.0–100.0)
RBC: 4.02 MIL/uL — ABNORMAL LOW (ref 4.22–5.81)

## 2011-05-10 MED ORDER — HYDROMORPHONE HCL PF 2 MG/ML IJ SOLN
3.0000 mg | INTRAMUSCULAR | Status: DC | PRN
  Administered 2011-05-10 – 2011-05-11 (×6): 3 mg via INTRAVENOUS
  Filled 2011-05-10 (×3): qty 3
  Filled 2011-05-10: qty 2
  Filled 2011-05-10: qty 1
  Filled 2011-05-10 (×2): qty 3

## 2011-05-10 MED ORDER — MOXIFLOXACIN HCL 400 MG PO TABS
400.0000 mg | ORAL_TABLET | Freq: Every day | ORAL | Status: DC
  Administered 2011-05-10 – 2011-05-12 (×3): 400 mg via ORAL
  Filled 2011-05-10 (×4): qty 1

## 2011-05-10 NOTE — Progress Notes (Signed)
05/10/2011 [name] Case Management Note [phone]        20yo male patient admitted on 05/07/11 with sickle cell crisis and pneumonia. In to speak with patient at 1242 pm, prior to admission, patient lived at home with support from parents. Independent with ADLs. Denies any home needs. No discharge needs noted at present. CM will continue to monitor.

## 2011-05-10 NOTE — Progress Notes (Signed)
Subjective:  Continued complaints of back pain. No complaints of chest pain. Mild complaints of shortness of breath. Patient uses between 3 and 7 mg of Dilaudid IV in a 4 hour period.  Objective:  Vital signs in last 24 hours:  Filed Vitals:   05/10/11 0400 05/10/11 0526 05/10/11 0805 05/10/11 1000  BP:  128/78  102/51  Pulse:  92  72  Temp:  97.9 F (36.6 C)  97.9 F (36.6 C)  TempSrc:  Oral  Oral  Resp: 18 17 18 18   Height:      Weight:  65.227 kg (143 lb 12.8 oz)    SpO2: 98% 98% 99% 97%    Intake/Output from previous day:   Intake/Output Summary (Last 24 hours) at 05/10/11 1059 Last data filed at 05/10/11 0716  Gross per 24 hour  Intake   3545 ml  Output   4150 ml  Net   -605 ml    Physical Exam: General: Alert, awake, oriented x3, in no acute distress, pleasant HEENT: No bruits, no goiter. Moist mucous membranes, no scleral icterus, no conjunctival pallor. Heart: Regular rate and rhythm, without murmurs, rubs, gallops. Lungs: Clear to auscultation bilaterally. No wheezing, no rhonchi, no rales.  Abdomen: Soft, nontender, nondistended, positive bowel sounds. Extremities: No clubbing cyanosis or edema,  positive pedal pulses. Neuro: Grossly intact, nonfocal.  Lab Results:  Basic Metabolic Panel:    Component Value Date/Time   NA 137 05/10/2011 0800   K 3.7 05/10/2011 0800   CL 105 05/10/2011 0800   CO2 25 05/10/2011 0800   BUN 5* 05/10/2011 0800   CREATININE 0.69 05/10/2011 0800   GLUCOSE 92 05/10/2011 0800   CALCIUM 8.6 05/10/2011 0800   CBC:    Component Value Date/Time   WBC 6.7 05/10/2011 0800   HGB 10.6* 05/10/2011 0800   HCT 30.6* 05/10/2011 0800   PLT 114* 05/10/2011 0800   MCV 76.1* 05/10/2011 0800   NEUTROABS 7.7 05/07/2011 1745   LYMPHSABS 0.9 05/07/2011 1745   MONOABS 1.1* 05/07/2011 1745   EOSABS 0.0 05/07/2011 1745   BASOSABS 0.0 05/07/2011 1745      Lab 05/10/11 0800 05/09/11 0954 05/08/11 0600 05/08/11 0020 05/07/11 1745    WBC 6.7 8.4 6.9 7.1 9.7  HGB 10.6* 11.4* 10.4* 10.1* 11.4*  HCT 30.6* 31.2* 28.6* 27.9* 30.8*  PLT 114* 105* 72* 73* 94*  MCV 76.1* 76.5* 76.7* 77.1* 75.7*  MCH 26.4 27.9 27.9 27.9 28.0  MCHC 34.6 36.5* 36.4* 36.2* 37.0*  RDW 15.4 15.0 14.4 14.6 14.0  LYMPHSABS -- -- -- -- 0.9  MONOABS -- -- -- -- 1.1*  EOSABS -- -- -- -- 0.0  BASOSABS -- -- -- -- 0.0  BANDABS -- -- -- -- --    Lab 05/10/11 0800 05/08/11 0600 05/08/11 0020 05/07/11 1745  NA 137 135 -- 136  K 3.7 3.6 -- 3.6  CL 105 104 -- 102  CO2 25 23 -- 22  GLUCOSE 92 96 -- 92  BUN 5* 6 -- 9  CREATININE 0.69 0.84 0.81 0.80  CALCIUM 8.6 8.4 -- 9.0  MG -- -- -- --   No results found for this basename: INR:5,PROTIME:5 in the last 168 hours Cardiac markers: No results found for this basename: CK:3,CKMB:3,TROPONINI:3,MYOGLOBIN:3 in the last 168 hours No results found for this basename: POCBNP:3 in the last 168 hours Recent Results (from the past 240 hour(s))  CULTURE, BLOOD (ROUTINE X 2)     Status: Normal (Preliminary result)   Collection  Time   05/07/11  7:45 PM      Component Value Range Status Comment   Specimen Description BLOOD LEFT HAND   Final    Special Requests     Final    Value: Normal BOTTLES DRAWN AEROBIC AND ANAEROBIC 5CC EACH   Setup Time [id number]   Final    Culture     Final    Value:        BLOOD CULTURE RECEIVED NO GROWTH TO DATE CULTURE WILL BE HELD FOR 5 DAYS BEFORE ISSUING A FINAL NEGATIVE REPORT   Report Status PENDING   Incomplete   CULTURE, BLOOD (ROUTINE X 2)     Status: Normal (Preliminary result)   Collection Time   05/07/11  7:49 PM      Component Value Range Status Comment   Specimen Description BLOOD LEFT ANTECUBITAL   Final    Special Requests     Final    Value: Normal BOTTLES DRAWN AEROBIC AND ANAEROBIC 10 CC EACH   Setup Time [id number]   Final    Culture     Final    Value:        BLOOD CULTURE RECEIVED NO GROWTH TO DATE CULTURE WILL BE HELD FOR 5 DAYS BEFORE ISSUING A FINAL  NEGATIVE REPORT   Report Status PENDING   Incomplete    Medications: Scheduled Meds:   . docusate sodium  100 mg Oral BID  . enoxaparin  40 mg Subcutaneous QHS  . HYDROmorphone PCA 0.3 mg/mL   Intravenous Q4H  . moxifloxacin  400 mg Oral q1800  . DISCONTD: azithromycin  500 mg Intravenous Q24H  . DISCONTD: cefTRIAXone (ROCEPHIN) IV  1 g Intravenous Q24H   Continuous Infusions:   . 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  Changed IV Antibiotics to PO Avelox.  Patient is re-hydrated will decrease IV Fluids.   #2 painful vaso-occlusive sickle cell crisis: Continue PCA Dilaudid. Discussed migrating to PO pain medications, but still using high doses of dilaudid so need to discuss with my Attending, Dr. Rito Ehrlich.  Have placed order for case management to arrange PCP follow up.  The patient has seen Dr. Willey Blade 2x in the past.   Patient can be discharged when his pain is controlled on oral medications.  Patient is s/p 1 unit PRBCs.  Patient discussed with my PA. Seen and examined. I agree with the above note and plan. He slowly improving.  LOS: 3 days   YORK,MARIANNE L 05/10/2011, 10:59 AM

## 2011-05-10 NOTE — Progress Notes (Signed)
Changed PCA dilaudid syringe.  Wasted 2 mL in sink.  Witnessed by Hilda Lias, RN.

## 2011-05-11 ENCOUNTER — Inpatient Hospital Stay (HOSPITAL_COMMUNITY): Payer: Self-pay

## 2011-05-11 LAB — BASIC METABOLIC PANEL
BUN: 5 mg/dL — ABNORMAL LOW (ref 6–23)
CO2: 26 mEq/L (ref 19–32)
Calcium: 9 mg/dL (ref 8.4–10.5)
Creatinine, Ser: 0.76 mg/dL (ref 0.50–1.35)
Glucose, Bld: 75 mg/dL (ref 70–99)

## 2011-05-11 LAB — COMPREHENSIVE METABOLIC PANEL
Alkaline Phosphatase: 74 U/L (ref 39–117)
BUN: 5 mg/dL — ABNORMAL LOW (ref 6–23)
Calcium: 9.4 mg/dL (ref 8.4–10.5)
Creatinine, Ser: 0.68 mg/dL (ref 0.50–1.35)
GFR calc Af Amer: >90 mL/min (ref >90)
Glucose, Bld: 88 mg/dL (ref 70–99)
Total Protein: 7.1 g/dL (ref 6.0–8.3)

## 2011-05-11 MED ORDER — HYDROMORPHONE HCL PF 2 MG/ML IJ SOLN
2.0000 mg | INTRAMUSCULAR | Status: DC | PRN
  Administered 2011-05-11 (×2): 2 mg via INTRAVENOUS
  Filled 2011-05-11: qty 2
  Filled 2011-05-11: qty 1
  Filled 2011-05-11: qty 2

## 2011-05-11 MED ORDER — HYDROCODONE-ACETAMINOPHEN 5-325 MG PO TABS
2.0000 | ORAL_TABLET | ORAL | Status: DC | PRN
  Administered 2011-05-11 – 2011-05-12 (×3): 2 via ORAL
  Filled 2011-05-11 (×3): qty 2

## 2011-05-11 MED ORDER — HYDROMORPHONE HCL PF 2 MG/ML IJ SOLN: 2.0000 mg | INTRAMUSCULAR | Status: DC | PRN

## 2011-05-11 NOTE — Progress Notes (Signed)
Patient ID: William Jennings, male   DOB: [birth year], 20 y.o.   MRN: [id number] Subjective: Patient seen.Complain of back pain.Denies any chest pain or sob  Objective: Weight change: -0.544 kg (-1 lb 3.2 oz)  Intake/Output Summary (Last 24 hours) at 05/11/11 1053 Last data filed at 05/11/11 0900  Gross per 24 hour  Intake 1840.83 ml  Output   3125 ml  Net -1284.17 ml   BP 97/63  Pulse 76  Temp(Src) 97.8 F (36.6 C) (Oral)  Resp 18  Ht 5\' 8"  (1.727 m)  Wt 64.683 kg (142 lb 9.6 oz)  BMI 21.68 kg/m2  SpO2 97% Physical Exam: General appearance: alert, cooperative and no distress Head: Normocephalic, without obvious abnormality, atraumatic Neck: no adenopathy, no carotid bruit, no JVD, supple, symmetrical, trachea midline and thyroid not enlarged, symmetric, no tenderness/mass/nodules Lungs: Decrease air entry rt lung base Heart: regular rate and rhythm, S1, S2 normal, no murmur, click, rub or gallop Abdomen: soft, non-tender; bowel sounds normal; no masses,  no organomegaly Extremities: extremities normal, atraumatic, no cyanosis or edema Skin: Skin color, texture, turgor normal. No rashes or lesions MSS-Tenderness in the lumbar region  Lab Results: Results for orders placed during the hospital encounter of 05/07/11 (from the past 48 hour(s))  BASIC METABOLIC PANEL     Status: Abnormal   Collection Time   05/10/11  8:00 AM      Component Value Range Comment   Sodium 137  135 - 145 (mEq/L)    Potassium 3.7  3.5 - 5.1 (mEq/L)    Chloride 105  96 - 112 (mEq/L)    CO2 25  19 - 32 (mEq/L)    Glucose, Bld 92  70 - 99 (mg/dL)    BUN 5 (*) 6 - 23 (mg/dL)    Creatinine, Ser 4.69  0.50 - 1.35 (mg/dL)    Calcium 8.6  8.4 - 10.5 (mg/dL)    GFR calc non Af Amer >90  >90 (mL/min)    GFR calc Af Amer >90  >90 (mL/min)   CBC     Status: Abnormal   Collection Time   05/10/11  8:00 AM      Component Value Range Comment   WBC 6.7  4.0 - 10.5 (K/uL)    RBC 4.02 (*) 4.22 - 5.81 (MIL/uL)      Hemoglobin 10.6 (*) 13.0 - 17.0 (g/dL)    HCT 62.9 (*) 52.8 - 52.0 (%)    MCV 76.1 (*) 78.0 - 100.0 (fL)    MCH 26.4  26.0 - 34.0 (pg)    MCHC 34.6  30.0 - 36.0 (g/dL)    RDW 41.3  24.4 - 01.0 (%)    Platelets 114 (*) 150 - 400 (K/uL) CONSISTENT WITH PREVIOUS RESULT  BASIC METABOLIC PANEL     Status: Abnormal   Collection Time   05/11/11  6:41 AM      Component Value Range Comment   Sodium 139  135 - 145 (mEq/L)    Potassium 4.3  3.5 - 5.1 (mEq/L)    Chloride 105  96 - 112 (mEq/L)    CO2 26  19 - 32 (mEq/L)    Glucose, Bld 75  70 - 99 (mg/dL)    BUN 5 (*) 6 - 23 (mg/dL)    Creatinine, Ser 2.72  0.50 - 1.35 (mg/dL)    Calcium 9.0  8.4 - 10.5 (mg/dL)    GFR calc non Af Amer >90  >90 (mL/min)    GFR calc Af  Amer >90  >90 (mL/min)     Micro Results: Recent Results (from the past 240 hour(s))  CULTURE, BLOOD (ROUTINE X 2)     Status: Normal (Preliminary result)   Collection Time   05/07/11  7:45 PM      Component Value Range Status Comment   Specimen Description BLOOD LEFT HAND   Final    Special Requests     Final    Value: Normal BOTTLES DRAWN AEROBIC AND ANAEROBIC 5CC EACH   Setup Time [id number]   Final    Culture     Final    Value:        BLOOD CULTURE RECEIVED NO GROWTH TO DATE CULTURE WILL BE HELD FOR 5 DAYS BEFORE ISSUING A FINAL NEGATIVE REPORT   Report Status PENDING   Incomplete   CULTURE, BLOOD (ROUTINE X 2)     Status: Normal (Preliminary result)   Collection Time   05/07/11  7:49 PM      Component Value Range Status Comment   Specimen Description BLOOD LEFT ANTECUBITAL   Final    Special Requests     Final    Value: Normal BOTTLES DRAWN AEROBIC AND ANAEROBIC 10 CC EACH   Setup Time [id number]   Final    Culture     Final    Value:        BLOOD CULTURE RECEIVED NO GROWTH TO DATE CULTURE WILL BE HELD FOR 5 DAYS BEFORE ISSUING A FINAL NEGATIVE REPORT   Report Status PENDING   Incomplete     Studies/Results: Dg Chest 2 View  05/07/2011  *RADIOLOGY  REPORT*  Clinical Data: Sickle cell disease.  Fever.  Upper respiratory infection.  CHEST - 2 VIEW  Comparison: 07/08/2010.  Findings: Increased density is present in the right lower lobe, with increasing density extending inferiorly over the thoracic spine.  This is compatible with right lower lobe airspace disease/pneumonia.  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  Cardiopericardial silhouette is within normal limits.  IMPRESSION:  1.  Patchy right lower lobe airspace disease suspicious for pneumonia. 2.  Bony sickle cell changes.  Original Report Authenticated By: Andreas Newport, M.D.   Medications: Scheduled Meds:   . docusate sodium  100 mg Oral BID  . enoxaparin  40 mg Subcutaneous QHS  . moxifloxacin  400 mg Oral q1800  . DISCONTD: HYDROmorphone PCA 0.3 mg/mL   Intravenous Q4H   Continuous Infusions:   . 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   LOS: 4 days   Zara Wendt 05/11/2011, 10:53 AM

## 2011-05-11 NOTE — Progress Notes (Signed)
05/11/2011 [name] Case Management Note [phone]         In to speak with patient regarding lack of PCP. Patient stated, "my insurance benefits are fixing to come in and I am going to start going to William Blade, MD." Refused help setting up PCP.

## 2011-05-12 LAB — CBC
HCT: 34.4 % — ABNORMAL LOW (ref 39.0–52.0)
Hemoglobin: 12.4 g/dL — ABNORMAL LOW (ref 13.0–17.0)
RBC: 4.52 MIL/uL (ref 4.22–5.81)
RDW: 16.2 % — ABNORMAL HIGH (ref 11.5–15.5)
WBC: 9.2 10*3/uL (ref 4.0–10.5)

## 2011-05-12 MED ORDER — HYDROMORPHONE HCL PF 1 MG/ML IJ SOLN
2.0000 mg | INTRAMUSCULAR | Status: DC | PRN
  Administered 2011-05-12 (×2): 2 mg via INTRAVENOUS
  Filled 2011-05-12 (×2): qty 2

## 2011-05-12 MED ORDER — LORAZEPAM 1 MG PO TABS: 1.0000 mg | ORAL_TABLET | Freq: Every evening | ORAL | Status: DC | PRN

## 2011-05-12 MED ORDER — HYDROCODONE-ACETAMINOPHEN 10-325 MG PO TABS
1.0000 | ORAL_TABLET | Freq: Every day | ORAL | Status: DC
  Administered 2011-05-12 (×4): 1 via ORAL
  Filled 2011-05-12 (×6): qty 1

## 2011-05-12 MED ORDER — SODIUM CHLORIDE 0.9 % IV SOLN
INTRAVENOUS | Status: DC
  Administered 2011-05-12 – 2011-05-13 (×2): via INTRAVENOUS

## 2011-05-12 MED ORDER — IBUPROFEN 800 MG PO TABS
800.0000 mg | ORAL_TABLET | Freq: Four times a day (QID) | ORAL | Status: DC | PRN
  Filled 2011-05-12: qty 1

## 2011-05-12 NOTE — Progress Notes (Signed)
Pt received 2mg  IV dilaudid at 2100 05/11/2011. apin was 7/10 on pain scale and back ache. Will continue to monitor. Ilean Skill LPN

## 2011-05-12 NOTE — Progress Notes (Signed)
At 0600 Pt c/o neck stiffness and head and back ache and sweating pt vital signs WNL pt medicated with 2mg  dilaudid IV pt also c/o of insomnia. Ilean Skill LPN

## 2011-05-12 NOTE — Progress Notes (Signed)
Patient ID: William Jennings, male   DOB: 1991-02-13, 20 y.o.   MRN: [id number] Patient ID: William Jennings, male   DOB: 1990-12-30, 20 y.o.   MRN: [id number] Subjective: I had a bad night.  Awoke with severe neck pain and numbness in left arm.  Clammy.  Felt poorly, back pain increased.    Objective: Weight change: -0.136 kg (-4.8 oz)  Intake/Output Summary (Last 24 hours) at 05/11/11 1053 Last data filed at 05/11/11 0900  Gross per 24 hour  Intake 1840.83 ml  Output   3125 ml  Net -1284.17 ml   BP 107/68  Pulse 61  Temp(Src) 97.8 F (36.6 C) (Oral)  Resp 18  Ht 5\' 8"  (1.727 m)  Wt 64.547 kg (142 lb 4.8 oz)  BMI 21.64 kg/m2  SpO2 95%  Physical Exam: General appearance: alert, cooperative and no distress Head: Normocephalic, without obvious abnormality, atraumatic Neck: has full ROM, no pain currently Lungs: no increased work of breathing, CTA Heart: regular rate and rhythm, S1, S2 normal, no murmur, click, rub or gallop, no Lower Ext Edema. Abdomen: soft, non-tender; bowel sounds normal; no masses,  no organomegaly Extremities: extremities normal, atraumatic, no cyanosis or edema Neuro:  Non-focal, CN intact, MS equal 5/5.  Lab Results: BASIC METABOLIC PANEL     Status: Abnormal   Collection Time   05/11/11  6:41 AM      Component Value Range Comment   Sodium 139  135 - 145 (mEq/L)    Potassium 4.3  3.5 - 5.1 (mEq/L)    Chloride 105  96 - 112 (mEq/L)    CO2 26  19 - 32 (mEq/L)    Glucose, Bld 75  70 - 99 (mg/dL)    BUN 5 (*) 6 - 23 (mg/dL)    Creatinine, Ser 7.82  0.50 - 1.35 (mg/dL)    Calcium 9.0  8.4 - 10.5 (mg/dL)    GFR calc non Af Amer >90  >90 (mL/min)    GFR calc Af Amer >90  >90 (mL/min)     Studies/Results: Dg Chest 2 View  05/07/2011  *RADIOLOGY REPORT*  Clinical Data: Sickle cell disease.  Fever.  Upper respiratory infection.  CHEST -   IMPRESSION:  1.  Patchy right lower lobe airspace disease suspicious for pneumonia. 2.  Bony sickle cell changes.    Medications: Scheduled Meds:    . docusate sodium  100 mg Oral BID  . enoxaparin  40 mg Subcutaneous QHS  . HYDROcodone-acetaminophen  1 tablet Oral 6 X Daily  . moxifloxacin  400 mg Oral q1800   Continuous Infusions:    . sodium chloride    . 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 #2 PNA (pneumonia)-Resolving. Will finish 7 days of Avelox 11/29. #3 Sickle cell crisis-Resolving and will continue pain management #4 Atypical symptoms last night, possibly anxiety.  Will add Ativan qhs prn  Dispo:  Potential discharge 11/29.  Will follow up with Dr. August Saucer.   LOS: 5 days   YORK,Arcenia Scarbro L 05/12/2011, 11:21 AM

## 2011-05-12 NOTE — Progress Notes (Signed)
20 year old male with history of sickle cell disease who presented with fever, back pain and cough. Patient was admitted for treatment of pneumonia and sickle cell crisis.   He was placed on Dilaudid PCA and underwent exchange transfusion.  Procedures:  Exchange transfusion November 24.  Overall the patient is improving, though he did have episode this morning where he felt clammy over his entire body. Etiology of significance unclear. Continue treatment for acute sickle cell anemia. He does not appear to have acute chest syndrome at this time. Continue antibiotics. Would anticipate discharge November 29.  Agree with exam, assessment and plan of Ms. York except as above.

## 2011-05-13 LAB — BASIC METABOLIC PANEL
CO2: 24 mEq/L (ref 19–32)
Calcium: 9.5 mg/dL (ref 8.4–10.5)
Glucose, Bld: 122 mg/dL — ABNORMAL HIGH (ref 70–99)
Potassium: 3.7 mEq/L (ref 3.5–5.1)
Sodium: 138 mEq/L (ref 135–145)

## 2011-05-13 LAB — CBC
Hemoglobin: 11.6 g/dL — ABNORMAL LOW (ref 13.0–17.0)
MCH: 27.4 pg (ref 26.0–34.0)
RBC: 4.23 MIL/uL (ref 4.22–5.81)

## 2011-05-13 MED ORDER — OXYCODONE-ACETAMINOPHEN 5-325 MG PO TABS: 1.0000 | ORAL_TABLET | ORAL | Status: AC | PRN

## 2011-05-13 MED ORDER — ALBUTEROL SULFATE HFA 108 (90 BASE) MCG/ACT IN AERS
2.0000 | INHALATION_SPRAY | RESPIRATORY_TRACT | Status: DC
  Filled 2011-05-13: qty 6.7

## 2011-05-13 MED ORDER — MOXIFLOXACIN HCL 400 MG PO TABS: 400.0000 mg | ORAL_TABLET | Freq: Every day | ORAL | Status: AC

## 2011-05-13 MED ORDER — ALBUTEROL SULFATE HFA 108 (90 BASE) MCG/ACT IN AERS: 2.0000 | INHALATION_SPRAY | Freq: Four times a day (QID) | RESPIRATORY_TRACT | Status: DC

## 2011-05-13 MED ORDER — ALBUTEROL SULFATE HFA 108 (90 BASE) MCG/ACT IN AERS: 2.0000 | INHALATION_SPRAY | RESPIRATORY_TRACT | Status: DC

## 2011-05-13 NOTE — Discharge Summary (Signed)
Patient ID: [name] MRN: [id number] DOB/AGE: April 10, 1991 20 y.o.  Admit date: 05/07/2011 Discharge date: 05/13/2011  Primary Care Physician:  No primary provider on file.  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 Qty: 1 Inhaler, Refills: 0    moxifloxacin (AVELOX) 400 MG tablet Take 1 tablet (400 mg total) by mouth daily at 6 PM. Qty: 3 tablet, Refills: 0      CONTINUE these medications which have CHANGED   Details  oxyCODONE-acetaminophen (PERCOCET) 5-325 MG per tablet Take 1 tablet by mouth every 4 (four) hours as needed for pain. Qty: 10 tablet, Refills: 0      CONTINUE these medications which have NOT CHANGED   Details  Pseudoeph-Doxylamine-DM-APAP (NYQUIL) 60-7.11-10-998 MG/30ML LIQD Take 30 mLs by mouth every 6 (six) hours as needed. For congestion     naproxen sodium (ANAPROX) 220 MG tablet Take 220 mg by mouth 2 (two) times daily with a meal. For pain       STOP taking these medications     ibuprofen (ADVIL,MOTRIN) 200 MG tablet        Procedures:  Exchange transfusion November 24.  Patient received one unit of packed red blood cells  Brief H and P: From the admission note:  William Jennings is an 20 y.o. male with history of sickle cell disease, prior acute chest syndrome, and unfortunately an active tobacco abuse, presents to [facility] with complaint of shortness of breath, chest and back pain, subjective fever, and coughs for the past 2 days. Workup at the [facility] revealed an infiltrate on his right chest by x-ray. He has a fever up to 103 but with normal white count and no left shift. His hemoglobin is 11.4 g per decaliter. Many  years ago, he was taken Hydrea, but it was stopped because he has produced adequate fetal hemoglobin (this is what he was told). For pain control, he is only on Percocet as needed along with ibuprofen.  He has not found a PCP as yet, because he didn't have any insurance, but he will have it shortly.   At the time of admission Mr. head was felt to have a sickle cell crisis and community acquired pneumonia he was started on IV fluids oxygen pain medications as well as Rocephin and Zithromax.  On November 24 he was seen by the attending physician he felt that the patient had acute chest syndrome with sickle cell disease and crisis. An exchange transfusion was ordered to try to reduce the sickled load.  Specifically 500 cc of blood was removed followed by one unit of packed red blood cells being transfused. No further transfusions were necessary during this admission.  Over the course of the next several days the patient slowly improved his chest pain completely resolved. His sickle cell pain appeared to be centered in his back. When he was able to tolerate the pain on oral medications without any IV pain medications he felt he was ready for discharge. Today, the morning of discharge the patient does have some congestion, but tells me that his pain is well controlled on oral Percocet he is willing to follow with Dr. Willey Blade as an  outpatient and an appointment has been scheduled for him on May 27, 2011.  He will receive a prescription for 3 more days of moxifloxacin to complete his antibiotic course.  Physical Exam on Discharge: General: Alert, awake, oriented x3, in no acute distress.  Eating well.  Denies any chest pain. HEENT: No bruits, no goiter. Heart: Regular rate and rhythm, without murmurs, rubs, gallops. Lungs: Clear to auscultation bilaterally. No wheezes or crackles. Abdomen: Soft, nontender, nondistended, positive bowel sounds. Extremities: No clubbing cyanosis or edema with positive  pedal pulses. Neuro: Grossly intact, nonfocal.  Filed Vitals:   05/12/11 1500 05/12/11 1832 05/12/11 2124 05/13/11 0554  BP: 118/77 122/67 109/67 96/56  Pulse: 100 85 71 83  Temp: 97.4 F (36.3 C) 98.6 F (37 C) 98 F (36.7 C) 97.5 F (36.4 C)  TempSrc: Oral Oral Oral Oral  Resp: 18 18 18 17   Height:      Weight:    64.6 kg (142 lb 6.7 oz)  SpO2: 98% 98% 99% 98%     Intake/Output Summary (Last 24 hours) at 05/13/11 1123 Last data filed at 05/13/11 0539  Gross per 24 hour  Intake 1233.33 ml  Output    550 ml  Net 683.33 ml    Results for William Jennings, William Jennings (MRN [id number]) as of 05/13/2011 11:36  Ref. Range 05/08/2011 00:20 05/08/2011 06:00 05/10/2011 08:00 05/11/2011 06:41 05/11/2011 11:50 05/13/2011 05:10  Sodium Latest Range: 135-145 mEq/L  135 137 139 138 138  Potassium Latest Range: 3.5-5.1 mEq/L  3.6 3.7 4.3 4.0 3.7  Chloride Latest Range: 96-112 mEq/L  104 105 105 104 104  CO2 Latest Range: 19-32 mEq/L  23 25 26 26 24   BUN Latest Range: 6-23 mg/dL  6 5 (L) 5 (L) 5 (L) 11  Creat Latest Range: 0.50-1.35 mg/dL 4.09 8.11 9.14 7.82 9.56 0.75  Calcium Latest Range: 8.4-10.5 mg/dL  8.4 8.6 9.0 9.4 9.5  GFR calc non Af Amer Latest Range: >90 mL/min >90 >90 >90 >90 >90 >90  GFR calc Af Amer Latest Range: >90 mL/min >90 >90 >90 >90 >90 >90  Glucose Latest Range: 70-99 mg/dL  96 92 75 88 213 (H)  Alkaline Phosphatase Latest Range: 39-117 U/L     74   Albumin Latest Range: 3.5-5.2 g/dL     3.9   AST Latest Range: 0-37 U/L     47 (H)   ALT Latest Range: 0-53 U/L     18   Total Protein Latest Range: 6.0-8.3 g/dL     7.1   Total Bilirubin Latest Range: 0.3-1.2 mg/dL     1.4 (H)   WBC Latest Range: 4.0-10.5 K/uL 7.1 6.9 6.7   8.0  RBC Latest Range: 4.22-5.81 MIL/uL 3.62 (L) 3.73 (L) 4.02 (L)   4.23  HGB Latest Range: 13.0-17.0 g/dL 08.6 (L) 57.8 (L) 46.9 (L)   11.6 (L)  HCT Latest Range: 39.0-52.0 % 27.9 (L) 28.6 (L) 30.6 (L)   32.2 (L)  MCV Latest Range: 78.0-100.0 fL 77.1 (L)  76.7 (L) 76.1 (L)   76.1 (L)  MCH Latest Range: 26.0-34.0 pg 27.9 27.9 26.4   27.4  MCHC Latest Range: 30.0-36.0 g/dL 62.9 (H) 52.8 (H) 41.3   36.0  RDW Latest Range: 11.5-15.5 % 14.6 14.4 15.4   16.1 (H)  Platelets Latest Range: 150-400 K/uL 73 (L) 72 (L) 114 (L)   234    Significant Diagnostic Studies:  Dg Chest 2 View  05/07/2011  IMPRESSION:  1.  Patchy right  lower lobe airspace disease suspicious for pneumonia. 2.  Bony sickle cell changes.   Disposition and Follow-up: The patient will be discharged home in his care. He is stable his pain has resolved. A followup appointment has been scheduled with Dr. Willey Blade.     Time spent on Discharge: 40 min.  SignedStephani Police 05/13/2011, 11:23 AM

## 2011-05-13 NOTE — Discharge Summary (Signed)
Patient seen and examined. He feels much better today. Afebrile and vital signs are stable. Exam was unremarkable. I agree with Ms. Yorks exam, assessment and plan as reflected in the discharge summary.

## 2011-05-14 LAB — CULTURE, BLOOD (ROUTINE X 2)
Culture  Setup Time: 201211240252
Special Requests: NORMAL

## 2012-06-22 ENCOUNTER — Emergency Department (HOSPITAL_BASED_OUTPATIENT_CLINIC_OR_DEPARTMENT_OTHER): Payer: Medicaid Other

## 2012-06-22 ENCOUNTER — Inpatient Hospital Stay (HOSPITAL_BASED_OUTPATIENT_CLINIC_OR_DEPARTMENT_OTHER)
Admission: EM | Admit: 2012-06-22 | Discharge: 2012-06-24 | DRG: 812 | Disposition: A | Discharge status: Home / Self Care | Payer: Medicaid Other | Attending: Internal Medicine | Admitting: Internal Medicine

## 2012-06-22 ENCOUNTER — Encounter (HOSPITAL_BASED_OUTPATIENT_CLINIC_OR_DEPARTMENT_OTHER): Payer: Self-pay

## 2012-06-22 DIAGNOSIS — D57219 Sickle-cell/Hb-C disease with crisis, unspecified: Principal | ICD-10-CM | POA: Diagnosis present

## 2012-06-22 DIAGNOSIS — R509 Fever, unspecified: Secondary | ICD-10-CM

## 2012-06-22 DIAGNOSIS — D571 Sickle-cell disease without crisis: Secondary | ICD-10-CM | POA: Diagnosis present

## 2012-06-22 DIAGNOSIS — Z91013 Allergy to seafood: Secondary | ICD-10-CM

## 2012-06-22 DIAGNOSIS — R5081 Fever presenting with conditions classified elsewhere: Secondary | ICD-10-CM | POA: Diagnosis present

## 2012-06-22 DIAGNOSIS — Z791 Long term (current) use of non-steroidal anti-inflammatories (NSAID): Secondary | ICD-10-CM

## 2012-06-22 DIAGNOSIS — F17200 Nicotine dependence, unspecified, uncomplicated: Secondary | ICD-10-CM | POA: Diagnosis present

## 2012-06-22 LAB — URINALYSIS, ROUTINE W REFLEX MICROSCOPIC
Glucose, UA: NEGATIVE mg/dL
pH: 6.5 (ref 5.0–8.0)

## 2012-06-22 LAB — CBC WITH DIFFERENTIAL/PLATELET
Basophils Absolute: 0 10*3/uL (ref 0.0–0.1)
HCT: 33.5 % — ABNORMAL LOW (ref 39.0–52.0)
Lymphocytes Relative: 11 % — ABNORMAL LOW (ref 12–46)
Monocytes Relative: 12 % (ref 3–12)
Platelets: 106 10*3/uL — ABNORMAL LOW (ref 150–400)
RDW: 13.9 % (ref 11.5–15.5)
WBC: 13.2 10*3/uL — ABNORMAL HIGH (ref 4.0–10.5)

## 2012-06-22 LAB — BASIC METABOLIC PANEL
CO2: 22 mEq/L (ref 19–32)
Chloride: 100 mEq/L (ref 96–112)
Sodium: 135 mEq/L (ref 135–145)

## 2012-06-22 LAB — URINE MICROSCOPIC-ADD ON

## 2012-06-22 LAB — RETICULOCYTES
Retic Count, Absolute: 163 10*3/uL (ref 19.0–186.0)
Retic Ct Pct: 3.9 % — ABNORMAL HIGH (ref 0.4–3.1)

## 2012-06-22 MED ORDER — HYDROMORPHONE HCL PF 1 MG/ML IJ SOLN
1.0000 mg | Freq: Once | INTRAMUSCULAR | Status: AC
  Administered 2012-06-22: 1 mg via INTRAVENOUS
  Filled 2012-06-22: qty 1

## 2012-06-22 MED ORDER — HYDROMORPHONE HCL PF 1 MG/ML IJ SOLN: 1.0000 mg | INTRAMUSCULAR | Status: DC | PRN

## 2012-06-22 MED ORDER — SODIUM CHLORIDE 0.9 % IV BOLUS (SEPSIS)
1000.0000 mL | Freq: Once | INTRAVENOUS | Status: AC
  Administered 2012-06-22: 1000 mL via INTRAVENOUS

## 2012-06-22 MED ORDER — ONDANSETRON HCL 4 MG/2ML IJ SOLN
4.0000 mg | Freq: Once | INTRAMUSCULAR | Status: AC
  Administered 2012-06-22: 4 mg via INTRAVENOUS
  Filled 2012-06-22: qty 2

## 2012-06-22 MED ORDER — ACETAMINOPHEN 325 MG PO TABS
650.0000 mg | ORAL_TABLET | Freq: Once | ORAL | Status: AC
  Administered 2012-06-22: 650 mg via ORAL
  Filled 2012-06-22: qty 2

## 2012-06-22 MED ORDER — SODIUM CHLORIDE 0.9 % IV SOLN
INTRAVENOUS | Status: AC
  Administered 2012-06-22: 22:00:00 via INTRAVENOUS

## 2012-06-22 NOTE — ED Provider Notes (Signed)
History     CSN: [id number]  Arrival date & time 06/22/12  1955   First MD Initiated Contact with Patient 06/22/12 2016      Chief Complaint  Patient presents with  . Sickle Cell Pain Crisis    (Consider location/radiation/quality/duration/timing/severity/associated sxs/prior treatment) HPI Comments: Pt states that he had a temp of 101 yesterday and he has progressively felt worse today:pt denies cough  Patient is a 22 y.o. male presenting with sickle cell pain. The history is provided by the patient. No language interpreter was used.  Sickle Cell Pain Crisis  This is a new problem. The current episode started yesterday. The problem occurs continuously. The problem has been unchanged. The pain location is generalized. The pain is similar to prior episodes. Pertinent negatives include no chest pain, no blurred vision, no sore throat, no weakness and no cough.    Past Medical History  Diagnosis Date  . Sickle cell disease     Past Surgical History  Procedure Date  . Cholecystectomy     No family history on file.  History  Substance Use Topics  . Smoking status: Current Every Day Smoker  . Smokeless tobacco: Not on file  . Alcohol Use: No      Review of Systems  Constitutional: Negative.   HENT: Negative for sore throat.   Eyes: Negative for blurred vision.  Respiratory: Negative for cough.   Cardiovascular: Negative for chest pain.  Neurological: Negative for weakness.    Allergies  Shellfish allergy  Home Medications   Current Outpatient Rx  Name  Route  Sig  Dispense  Refill  . ALBUTEROL SULFATE HFA 108 (90 BASE) MCG/ACT IN AERS   Inhalation   Inhale 2 puffs into the lungs every 6 (six) hours.   1 Inhaler   0   . NAPROXEN SODIUM 220 MG PO TABS   Oral   Take 220 mg by mouth 2 (two) times daily with a meal. For pain          . PSEUDOEPH-DOXYLAMINE-DM-APAP 60-7.11-10-998 MG/30ML PO LIQD   Oral   Take 30 mLs by mouth every 6 (six) hours as needed.  For congestion            BP 116/57  Pulse 117  Temp 100 F (37.8 C) (Oral)  Resp 18  Wt 140 lb (63.504 kg)  SpO2 100%  Physical Exam  Nursing note and vitals reviewed. Constitutional: He is oriented to person, place, and time. He appears well-developed and well-nourished.  HENT:  Head: Normocephalic.  Right Ear: External ear normal.  Left Ear: External ear normal.  Mouth/Throat: Oropharynx is clear and moist.  Eyes: Conjunctivae normal and EOM are normal. Pupils are equal, round, and reactive to light.  Neck: Normal range of motion. Neck supple.  Cardiovascular: Normal rate and regular rhythm.   Pulmonary/Chest: Effort normal and breath sounds normal.  Abdominal: Soft. Bowel sounds are normal. There is no tenderness.  Musculoskeletal: Normal range of motion.  Neurological: He is alert and oriented to person, place, and time. He exhibits abnormal muscle tone. Coordination normal.  Skin: Skin is warm and dry.  Psychiatric: He has a normal mood and affect.    ED Course  Procedures (including critical care time)  Labs Reviewed  CBC WITH DIFFERENTIAL - Abnormal; Notable for the following:    WBC 13.2 (*)     Hemoglobin 12.1 (*)     HCT 33.5 (*)     MCHC 36.1 (*)  Platelets 106 (*)  PLATELET COUNT CONFIRMED BY SMEAR   Lymphocytes Relative 11 (*)     Neutro Abs 10.1 (*)     Monocytes Absolute 1.6 (*)     All other components within normal limits  BASIC METABOLIC PANEL - Abnormal; Notable for the following:    Glucose, Bld 160 (*)     All other components within normal limits  URINALYSIS, ROUTINE W REFLEX MICROSCOPIC  RETICULOCYTES  CULTURE, BLOOD (ROUTINE X 2)  CULTURE, BLOOD (ROUTINE X 2)   Dg Chest 2 View  06/22/2012  *RADIOLOGY REPORT*  Clinical Data: Sickle cell disease.  Head and back pain.  CHEST - 2 VIEW  Comparison: Single view of the chest 05/11/2011.  Findings: The lungs are clear.  Heart size is normal.  There is no pneumothorax or pleural effusion.   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  IMPRESSION: No acute abnormality.   Original Report Authenticated By: Holley Dexter, M.D.      1. Sickle cell anemia   2. Fever       MDM  Pt febrile and continues to complain of pain:pt to go to [facility] long Team 9 Dr. Eben Burow accpted        Teressa Lower, NP 06/22/12 2150

## 2012-06-22 NOTE — ED Notes (Signed)
Nurse informed of Pt's vitals

## 2012-06-22 NOTE — ED Provider Notes (Signed)
Medical screening examination/treatment/procedure(s) were performed by non-physician practitioner and as supervising physician I was immediately available for consultation/collaboration.    Celene Kras, MD 06/22/12 2151

## 2012-06-22 NOTE — ED Notes (Signed)
C/o sickle cell crisis-c/o pain to head and back started yesterday

## 2012-06-22 NOTE — ED Notes (Signed)
Patient transported to X-ray 

## 2012-06-23 DIAGNOSIS — D571 Sickle-cell disease without crisis: Secondary | ICD-10-CM | POA: Diagnosis present

## 2012-06-23 LAB — BASIC METABOLIC PANEL
BUN: 6 mg/dL (ref 6–23)
CO2: 25 mEq/L (ref 19–32)
Glucose, Bld: 107 mg/dL — ABNORMAL HIGH (ref 70–99)
Potassium: 3.7 mEq/L (ref 3.5–5.1)
Sodium: 133 mEq/L — ABNORMAL LOW (ref 135–145)

## 2012-06-23 LAB — CBC
HCT: 31.5 % — ABNORMAL LOW (ref 39.0–52.0)
Hemoglobin: 11.3 g/dL — ABNORMAL LOW (ref 13.0–17.0)
MCH: 28.5 pg (ref 26.0–34.0)
RBC: 3.96 MIL/uL — ABNORMAL LOW (ref 4.22–5.81)

## 2012-06-23 LAB — HEPATIC FUNCTION PANEL
AST: 61 U/L — ABNORMAL HIGH (ref 0–37)
Albumin: 3.6 g/dL (ref 3.5–5.2)
Alkaline Phosphatase: 78 U/L (ref 39–117)
Bilirubin, Direct: 0.7 mg/dL — ABNORMAL HIGH (ref 0.0–0.3)
Total Bilirubin: 3.1 mg/dL — ABNORMAL HIGH (ref 0.3–1.2)

## 2012-06-23 MED ORDER — PROMETHAZINE HCL 25 MG PO TABS: 12.5000 mg | ORAL_TABLET | Freq: Four times a day (QID) | ORAL | Status: DC | PRN

## 2012-06-23 MED ORDER — OXYCODONE HCL 5 MG/5ML PO SOLN
5.0000 mg | ORAL | Status: DC
  Administered 2012-06-23 – 2012-06-24 (×6): 10 mg via ORAL
  Filled 2012-06-23 (×7): qty 10

## 2012-06-23 MED ORDER — HYDROMORPHONE HCL PF 1 MG/ML IJ SOLN
1.0000 mg | INTRAMUSCULAR | Status: AC | PRN
  Administered 2012-06-23 (×2): 2 mg via INTRAVENOUS
  Filled 2012-06-23 (×2): qty 2

## 2012-06-23 MED ORDER — MORPHINE SULFATE 4 MG/ML IJ SOLN: 4.0000 mg | INTRAMUSCULAR | Status: DC | PRN

## 2012-06-23 MED ORDER — KETOROLAC TROMETHAMINE 30 MG/ML IJ SOLN
30.0000 mg | Freq: Four times a day (QID) | INTRAMUSCULAR | Status: DC | PRN
  Administered 2012-06-23 – 2012-06-24 (×3): 30 mg via INTRAVENOUS
  Filled 2012-06-23 (×4): qty 1

## 2012-06-23 MED ORDER — DEXTROSE 5 % IV SOLN
2000.0000 mg | Freq: Every day | INTRAVENOUS | Status: DC
  Administered 2012-06-23: 2000 mg via INTRAVENOUS
  Filled 2012-06-23 (×3): qty 2

## 2012-06-23 MED ORDER — FOLIC ACID 1 MG PO TABS
1.0000 mg | ORAL_TABLET | Freq: Every day | ORAL | Status: DC
  Administered 2012-06-23 – 2012-06-24 (×2): 1 mg via ORAL
  Filled 2012-06-23 (×2): qty 1

## 2012-06-23 MED ORDER — FOLIC ACID 1 MG PO TABS: 1.0000 mg | ORAL_TABLET | Freq: Every day | ORAL | Status: DC

## 2012-06-23 MED ORDER — ENOXAPARIN SODIUM 40 MG/0.4ML ~~LOC~~ SOLN
40.0000 mg | SUBCUTANEOUS | Status: DC
  Administered 2012-06-23 – 2012-06-24 (×2): 40 mg via SUBCUTANEOUS
  Filled 2012-06-23 (×2): qty 0.4

## 2012-06-23 MED ORDER — SODIUM CHLORIDE 0.9 % IV SOLN
INTRAVENOUS | Status: DC
  Administered 2012-06-23: 125 mL via INTRAVENOUS
  Administered 2012-06-23 – 2012-06-24 (×2): via INTRAVENOUS

## 2012-06-23 NOTE — Progress Notes (Signed)
Subjective: Pt states that his pain is presently at 5/10 and analgesic effect lasting about 4 hours. His baseline pain level is 5-6/10 and is usually manageable with ibuprofen. Pt reports that pain is localized to legs and chest. Objective: Filed Vitals:   06/22/12 2244 06/22/12 2323 06/23/12 0511 06/23/12 1500  BP: 99/59 105/58 93/46 107/65  Pulse: 120 99 80 87  Temp: 100.7 F (38.2 C) 100.8 F (38.2 C) 98.6 F (37 C) 99 F (37.2 C)  TempSrc: Oral Oral Oral Oral  Resp: 18 18 16 16   Height:  5\' 8"  (1.727 m)    Weight:  64.411 kg (142 lb)    SpO2: 99% 97% 98% 100%   Weight change:  No intake or output data in the 24 hours ending 06/23/12 1650  General: Very pleasant gentleman. Alert, awake, oriented x3, in no acute distress. Wife at bedside with infant daughter. HEENT: [place]/AT PEERL, EOMI, Anicteric OROPHARYNX:  Moist, No exudate/ erythema/lesions.  Heart: Regular rate and rhythm, without murmurs, rubs, gallops Lungs: Clear to auscultation, no wheezing or rhonchi noted.  Abdomen: Soft, nontender, nondistended, positive bowel sounds, no masses no hepatosplenomegaly noted.  Neuro: No focal neurological deficits noted cranial nerves II through XII grossly intact. Strength normal in bilateral upper and lower extremities. Musculoskeletal: No warm swelling or erythema around joints, no spinal tenderness noted.    Lab Results:  Basename 06/23/12 0500 06/22/12 2047  NA 133* 135  K 3.7 3.5  CL 102 100  CO2 25 22  GLUCOSE 107* 160*  BUN 6 8  CREATININE 0.86 1.10  CALCIUM 8.5 9.3  MG -- --  PHOS -- --    Basename 06/23/12 0500  AST 61*  ALT 30  ALKPHOS 78  BILITOT 3.1*  PROT 6.9  ALBUMIN 3.6   No results found for this basename: LIPASE:2,AMYLASE:2 in the last 72 hours  Basename 06/23/12 0500 06/22/12 2047  WBC 9.9 13.2*  NEUTROABS -- 10.1*  HGB 11.3* 12.1*  HCT 31.5* 33.5*  MCV 79.5 79.4  PLT 83* 106*   No results found for this basename:  CKTOTAL:3,CKMB:3,CKMBINDEX:3,TROPONINI:3 in the last 72 hours No components found with this basename: POCBNP:3 No results found for this basename: DDIMER:2 in the last 72 hours No results found for this basename: HGBA1C:2 in the last 72 hours No results found for this basename: CHOL:2,HDL:2,LDLCALC:2,TRIG:2,CHOLHDL:2,LDLDIRECT:2 in the last 72 hours No results found for this basename: TSH,T4TOTAL,FREET3,T3FREE,THYROIDAB in the last 72 hours  Basename 06/22/12 2047  VITAMINB12 --  FOLATE --  FERRITIN --  TIBC --  IRON --  RETICCTPCT 3.9*    Micro Results: No results found for this or any previous visit (from the past 240 hour(s)).  Studies/Results: Dg Chest 2 View  06/22/2012  *RADIOLOGY REPORT*  Clinical Data: Sickle cell disease.  Head and back pain.  CHEST - 2 VIEW  Comparison: Single view of the chest 05/11/2011.  Findings: The lungs are clear.  Heart size is normal.  There is no pneumothorax or pleural effusion.  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  IMPRESSION: No acute abnormality.   Original Report Authenticated By: Holley Dexter, M.D.     Medications: I have reviewed the patient's current medications. Scheduled Meds:   . deferoxamine (DESFERAL) IV  2,000 mg Intravenous QHS  . enoxaparin (LOVENOX) injection  40 mg Subcutaneous Q24H  . oxyCODONE  5-10 mg Oral Q4H   Continuous Infusions:   .  sodium chloride 125 mL (06/23/12 1241)   PRN Meds:.promethazine Assessment/Plan: Patient Active Hospital Problem List: Sickle cell crisis (05/07/2011)   Assessment: Pt much improved. Will transition to oral medications; Oxycodone (1mg /ml) 5-10 mg po q 4 hours with IV dilaudid for breakthrough pain. Continue IVF for the next 24 hours and d/c when hydration restored. Add Toradol.   Sickle cell anemia  Hgb [place](06/23/2012)   Assessment: Hb stable. Hemoglobin electrophoresis pending.  Pt states that he had been on Hydrea but it has made him ill in the past thus it was discontinued.  Secondary Hemochromatosis    Assessment: Pt had a Ferritin level from 02/01/2013 which was elevated at 4510. I suspect that this level represents secondary hemachromatosis rather than an acute phase reactant. He is presently on Desferal during this hospitalization. I would recommend a follow-up level when in the convalescent state and consideration of Exjade by Dr. August Saucer.  Disposition: Anticipate D/C home tomorrow.       LOS: 1 day

## 2012-06-23 NOTE — H&P (Signed)
[facility] ADMISSION NOTE     Date: 06/23/2012               Patient Name:  William Jennings MRN: [id number]  DOB: 1990-08-27 Age / Sex: 22 y.o., male   PCP: August Saucer, ERIC              Medical Service: [facility]        Chief Complaint: generalized pain  History of Present Illness: Patient is a 22 y.o. male with a PMHx of sickle cell disease, who [facility] with chief complaints of fevers, headaches, back pain, hand pain and leg pain since 1 day. Patient's initial lab was suggestive of elevated white count with neutrophilic predominance. No abnormalities were seen on basic metabolic profile or chest x-ray.  The patient said that the symptoms are consistent with what he has when he gets sickle cell crisis. He was given 1 mg Dilaudid IV which provided him some relief. It was decided to admit the patient for pain control and IV hydration.   Review of Systems: Constitutional:  denies  chills, diaphoresis, appetite change and fatigue.positive for fevers   HEENT: denies photophobia, eye pain, redness, hearing loss, ear pain, congestion, sore throat, rhinorrhea, sneezing, neck pain, neck stiffness and tinnitus.  Respiratory: denies SOB, DOE, cough, chest tightness, and wheezing.  Cardiovascular: denies chest pain, palpitations and leg swelling.  Gastrointestinal: denies nausea, vomiting, abdominal pain, diarrhea, constipation, blood in stool.  Genitourinary: denies dysuria, urgency, frequency, hematuria, flank pain and difficulty urinating.  Musculoskeletal:  admits myalgias, back pain, hand pain and leg pain   Skin: denies pallor, rash and wound.  Neurological: denies dizziness, seizures, syncope, weakness, light-headedness, numbness. Admits to headaches.  Hematological: denies adenopathy, easy bruising, personal or family bleeding history.  Psychiatric/ Behavioral: denies suicidal ideation, mood changes, confusion, nervousness, sleep disturbance and  agitation.    Current Outpatient Medications: Current Facility-Administered Medications  Medication Dose Route Frequency Provider Last Rate Last Dose  . 0.9 %  sodium chloride infusion   Intravenous STAT Teressa Lower, NP 125 mL/hr at 06/22/12 2213    . 0.9 %  sodium chloride infusion   Intravenous Continuous Lars Mage, MD      . enoxaparin (LOVENOX) injection 40 mg  40 mg Subcutaneous Q24H Linnie Delgrande, MD      . HYDROmorphone (DILAUDID) injection 1-2 mg  1-2 mg Intravenous Q4H PRN Lars Mage, MD      . morphine 4 MG/ML injection 4 mg  4 mg Intravenous Q4H PRN Lars Mage, MD      . promethazine (PHENERGAN) tablet 12.5 mg  12.5 mg Oral Q6H PRN Lars Mage, MD        Allergies: Allergies  Allergen Reactions  . Shellfish Allergy Anaphylaxis     Past Medical History: Past Medical History  Diagnosis Date  . Sickle cell disease     Past Surgical History: Past Surgical History  Procedure Date  . Cholecystectomy     Family History: No family history on file.  Social History: History   Social History  . Marital Status: Single    Spouse Name: N/A    Number of Children: N/A  . Years of Education: N/A   Occupational History  . Not on file.   Social History Main Topics  . Smoking status: Current Every Day Smoker  . Smokeless tobacco: Not on file  . Alcohol Use: No  . Drug Use: No  . Sexually Active: Not on file  Other Topics Concern  . Not on file   Social History Narrative  . No narrative on file     Vital Signs: Blood pressure 105/58, pulse 99, temperature 100.8 F (38.2 C), temperature source Oral, resp. rate 18, height 5\' 8"  (1.727 m), weight 142 lb (64.411 kg), SpO2 97.00%.  Physical Exam: General: Vital signs reviewed and noted. Well-developed, well-nourished, in no acute distress; alert, appropriate and cooperative throughout examination.  Head: Normocephalic, atraumatic.  Eyes: PERRL, EOMI, No signs of anemia or jaundince.  Nose: Mucous membranes  moist, not inflammed, nonerythematous.  Throat: Oropharynx nonerythematous, no exudate appreciated.   Neck: No deformities, masses, or tenderness noted.Supple, No carotid Bruits, no JVD.  Lungs:  Normal respiratory effort. Clear to auscultation BL without crackles or wheezes.  Heart: RRR. S1 and S2 normal without gallop, murmur, or rubs.  Abdomen:  BS normoactive. Soft, Nondistended, non-tender.  No masses or organomegaly.  Extremities: No pretibial edema.  Neurologic: A&O X3, CN II - XII are grossly intact. Motor strength is 5/5 in the all 4 extremities, Sensations intact to light touch, Cerebellar signs negative.  Skin: No visible rashes, scars.   Lab results: CBC:    Component Value Date/Time   WBC 13.2* 06/22/2012 2047   HGB 12.1* 06/22/2012 2047   HCT 33.5* 06/22/2012 2047   PLT 106* 06/22/2012 2047   MCV 79.4 06/22/2012 2047   NEUTROABS 10.1* 06/22/2012 2047   LYMPHSABS 1.5 06/22/2012 2047   MONOABS 1.6* 06/22/2012 2047   EOSABS 0.0 06/22/2012 2047   BASOSABS 0.0 06/22/2012 2047     Metabolic Panel:    Component Value Date/Time   NA 135 06/22/2012 2047   K 3.5 06/22/2012 2047   CL 100 06/22/2012 2047   CO2 22 06/22/2012 2047   BUN 8 06/22/2012 2047   CREATININE 1.10 06/22/2012 2047   GLUCOSE 160* 06/22/2012 2047   CALCIUM 9.3 06/22/2012 2047   AST 47* 05/11/2011 1150   ALT 18 05/11/2011 1150   ALKPHOS 74 05/11/2011 1150   BILITOT 1.4* 05/11/2011 1150   PROT 7.1 05/11/2011 1150   ALBUMIN 3.9 05/11/2011 1150     Urinalysis:  Basename 06/22/12 2238  COLORURINE YELLOW  LABSPEC 1.013  PHURINE 6.5  GLUCOSEU NEGATIVE  HGBUR NEGATIVE  BILIRUBINUR NEGATIVE  KETONESUR 15*  PROTEINUR NEGATIVE  UROBILINOGEN 4.0*  NITRITE NEGATIVE  LEUKOCYTESUR TRACE*     Drugs of Abuse     Component Value Date/Time   LABOPIA NONE DETECTED 01/29/2010 2147   COCAINSCRNUR NONE DETECTED 01/29/2010 2147   LABBENZ NONE DETECTED 01/29/2010 2147   AMPHETMU NONE DETECTED 01/29/2010 2147   THCU POSITIVE* 01/29/2010 2147     LABBARB  Value: NONE DETECTED        DRUG SCREEN FOR MEDICAL PURPOSES ONLY.  IF CONFIRMATION IS NEEDED FOR ANY PURPOSE, NOTIFY LAB WITHIN 5 DAYS.        LOWEST DETECTABLE LIMITS FOR URINE DRUG SCREEN Drug Class       Cutoff (ng/mL) Amphetamine      1000 Barbiturate      200 Benzodiazepine   200 Tricyclics       300 Opiates          300 Cocaine          300 THC              50 01/29/2010 2147      Imaging results:  Dg Chest 2 View  06/22/2012  *RADIOLOGY REPORT*  Clinical Data: Sickle cell disease.  Head and back pain.  CHEST - 2 VIEW  Comparison: Single view of the chest 05/11/2011.  Findings: The lungs are clear.  Heart size is normal.  There is no pneumothorax or pleural effusion.  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  IMPRESSION: No acute abnormality.   Original Report Authenticated By: Holley Dexter, M.D.       Assessment & Plan:  Pt is a 22 y.o. yo male with a PMHx of sickle cell disease who was admitted on 06/22/2012 with symptoms of generalized pain for a day prior to his admission. Interventions at this time will be focused on pain control and IV hydration for his sickle cell disease.    Sickle cell pain crisis: Patient presents with fevers and pain in his back, legs, hands and headache which is typical of his pain crisis. Pain control with IV Dilaudid as needed as it will worked well for him at med center.  -IV fluids with 125 cc an hour -No indication for antibiotics at this time -May consider repeating a chest x-ray tomorrow morning as sometimes pneumonia may be masked during dehydration -Sickle cell service to take over care in the morning. -IV Phenergan for control of nausea/vomiting     DVT PPX - low molecular weight heparin  CODE STATUS - Full  DISPO - Disposition is deferred at this time, awaiting improvement of pain symptoms. Anticipated discharge in approximately  2-3 day(s).    Signed: Lars Mage, MD  06/23/2012, 12:35 AM

## 2012-06-24 LAB — CBC WITH DIFFERENTIAL/PLATELET
Basophils Absolute: 0 10*3/uL (ref 0.0–0.1)
HCT: 28.8 % — ABNORMAL LOW (ref 39.0–52.0)
Hemoglobin: 10.4 g/dL — ABNORMAL LOW (ref 13.0–17.0)
Lymphocytes Relative: 33 % (ref 12–46)
Lymphs Abs: 1.9 10*3/uL (ref 0.7–4.0)
Monocytes Absolute: 0.8 10*3/uL (ref 0.1–1.0)
Monocytes Relative: 14 % — ABNORMAL HIGH (ref 3–12)
Neutro Abs: 2.9 10*3/uL (ref 1.7–7.7)
RBC: 3.67 MIL/uL — ABNORMAL LOW (ref 4.22–5.81)
RDW: 14.1 % (ref 11.5–15.5)
WBC: 5.6 10*3/uL (ref 4.0–10.5)

## 2012-06-24 MED ORDER — ZOLPIDEM TARTRATE 5 MG PO TABS
5.0000 mg | ORAL_TABLET | Freq: Once | ORAL | Status: AC
  Administered 2012-06-24: 5 mg via ORAL
  Filled 2012-06-24: qty 1

## 2012-06-24 MED ORDER — OXYCODONE HCL 5 MG/5ML PO SOLN: 5.0000 mg | ORAL | Status: DC

## 2012-06-24 NOTE — Progress Notes (Signed)
Patient's iv fluids stopped and iv was removed. Patient ambulated on the unit per md order. Oxygen saturation post ambulation was 97% on room air. Discharge instructions provided including prescription and follow up appointment with teach back. Patient demonstrates and verbalizes understanding.

## 2012-06-24 NOTE — Discharge Summary (Signed)
Physician Discharge Summary  William Jennings MRN: [id number] DOB/AGE: Apr 23, 1991 22 y.o.  PCP: Willey Blade, MD   Admit date: 06/22/2012 Discharge date: 06/24/2012  Discharge Diagnoses:   :  *Sickle cell crisis Active Problems:  Sickle cell anemia     Medication List     As of 06/24/2012  1:27 PM    TAKE these medications         folic acid 1 MG tablet   Commonly known as: FOLVITE   Take 1 tablet (1 mg total) by mouth daily.      ibuprofen 200 MG tablet   Commonly known as: ADVIL,MOTRIN   Take 400 mg by mouth every 6 (six) hours as needed. For pain      oxyCODONE 5 MG/5ML solution   Commonly known as: ROXICODONE   Take 5-10 mLs (5-10 mg total) by mouth every 4 (four) hours.        Discharge Condition: *Stable Disposition: 01-Home or Self Care   Consults:    Significant Diagnostic Studies: Dg Chest 2 View  06/22/2012  *RADIOLOGY REPORT*  Clinical Data: Sickle cell disease.  Head and back pain.  CHEST - 2 VIEW  Comparison: Single view of the chest 05/11/2011.  Findings: The lungs are clear.  Heart size is normal.  There is no pneumothorax or pleural effusion.  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  IMPRESSION: No acute abnormality.   Original Report Authenticated By: Holley Dexter, M.D.      Microbiology: No results found for this or any previous visit (from the past 240 hour(s)).   Labs: Results for orders placed during the hospital encounter of 06/22/12 (from the past 48 hour(s))  CBC WITH DIFFERENTIAL     Status: Abnormal   Collection Time   06/22/12  8:47 PM      Component Value Range Comment   WBC 13.2 (*) 4.0 - 10.5 K/uL    RBC 4.22  4.22 - 5.81 MIL/uL    Hemoglobin 12.1 (*) 13.0 - 17.0 g/dL    HCT 30.8 (*) 65.7 - 52.0 %    MCV 79.4  78.0 - 100.0 fL    MCH 28.7  26.0 - 34.0 pg    MCHC 36.1 (*) 30.0 - 36.0 g/dL    RDW 84.6  96.2 - 95.2 %    Platelets 106 (*) 150 - 400 K/uL PLATELET COUNT CONFIRMED BY SMEAR   Neutrophils Relative 77  43 - 77 %    Lymphocytes Relative 11 (*) 12 - 46 %    Monocytes Relative 12  3 - 12 %    Eosinophils Relative 0  0 - 5 %    Basophils Relative 0  0 - 1 %    Neutro Abs 10.1 (*) 1.7 - 7.7 K/uL    Lymphs Abs 1.5  0.7 - 4.0 K/uL    Monocytes Absolute 1.6 (*) 0.1 - 1.0 K/uL    Eosinophils Absolute 0.0  0.0 - 0.7 K/uL    Basophils Absolute 0.0  0.0 - 0.1 K/uL   BASIC METABOLIC PANEL     Status: Abnormal   Collection Time   06/22/12  8:47 PM      Component Value Range Comment   Sodium 135  135 - 145 mEq/L    Potassium 3.5  3.5 - 5.1 mEq/L    Chloride 100  96 - 112 mEq/L    CO2  22  19 - 32 mEq/L    Glucose, Bld 160 (*) 70 - 99 mg/dL    BUN 8  6 - 23 mg/dL    Creatinine, Ser 7.82  0.50 - 1.35 mg/dL    Calcium 9.3  8.4 - 95.6 mg/dL    GFR calc non Af Amer >90  >90 mL/min    GFR calc Af Amer >90  >90 mL/min   RETICULOCYTES     Status: Abnormal   Collection Time   06/22/12  8:47 PM      Component Value Range Comment   Retic Ct Pct 3.9 (*) 0.4 - 3.1 %    RBC. 4.18 (*) 4.22 - 5.81 MIL/uL    Retic Count, Manual 163.0  19.0 - 186.0 K/uL   URINALYSIS, ROUTINE W REFLEX MICROSCOPIC     Status: Abnormal   Collection Time   06/22/12 10:38 PM      Component Value Range Comment   Color, Urine YELLOW  YELLOW    APPearance CLEAR  CLEAR    Specific Gravity, Urine 1.013  1.005 - 1.030    pH 6.5  5.0 - 8.0    Glucose, UA NEGATIVE  NEGATIVE mg/dL    Hgb urine dipstick NEGATIVE  NEGATIVE    Bilirubin Urine NEGATIVE  NEGATIVE    Ketones, ur 15 (*) NEGATIVE mg/dL    Protein, ur NEGATIVE  NEGATIVE mg/dL    Urobilinogen, UA 4.0 (*) 0.0 - 1.0 mg/dL    Nitrite NEGATIVE  NEGATIVE    Leukocytes, UA TRACE (*) NEGATIVE   URINE MICROSCOPIC-ADD ON     Status: Normal   Collection Time   06/22/12 10:38 PM      Component Value Range Comment   Squamous Epithelial / LPF RARE  RARE    WBC, UA 0-2  <3 WBC/hpf    Bacteria, UA  RARE  RARE   CBC     Status: Abnormal   Collection Time   06/23/12  5:00 AM      Component Value Range Comment   WBC 9.9  4.0 - 10.5 K/uL WHITE COUNT CONFIRMED ON SMEAR   RBC 3.96 (*) 4.22 - 5.81 MIL/uL    Hemoglobin 11.3 (*) 13.0 - 17.0 g/dL    HCT 21.3 (*) 08.6 - 52.0 %    MCV 79.5  78.0 - 100.0 fL    MCH 28.5  26.0 - 34.0 pg    MCHC 35.9  30.0 - 36.0 g/dL    RDW 57.8  46.9 - 62.9 %    Platelets 83 (*) 150 - 400 K/uL   BASIC METABOLIC PANEL     Status: Abnormal   Collection Time   06/23/12  5:00 AM      Component Value Range Comment   Sodium 133 (*) 135 - 145 mEq/L    Potassium 3.7  3.5 - 5.1 mEq/L    Chloride 102  96 - 112 mEq/L    CO2 25  19 - 32 mEq/L    Glucose, Bld 107 (*) 70 - 99 mg/dL    BUN 6  6 - 23 mg/dL    Creatinine, Ser 5.28  0.50 - 1.35 mg/dL    Calcium 8.5  8.4 - 41.3 mg/dL    GFR calc non Af Amer >90  >90 mL/min    GFR calc Af Amer >90  >90 mL/min   HEPATIC FUNCTION PANEL     Status: Abnormal   Collection Time   06/23/12  5:00 AM  Component Value Range Comment   Total Protein 6.9  6.0 - 8.3 g/dL    Albumin 3.6  3.5 - 5.2 g/dL    AST 61 (*) 0 - 37 U/L    ALT 30  0 - 53 U/L    Alkaline Phosphatase 78  39 - 117 U/L    Total Bilirubin 3.1 (*) 0.3 - 1.2 mg/dL    Bilirubin, Direct 0.7 (*) 0.0 - 0.3 mg/dL    Indirect Bilirubin 2.4 (*) 0.3 - 0.9 mg/dL   CBC WITH DIFFERENTIAL     Status: Abnormal   Collection Time   06/24/12  5:43 AM      Component Value Range Comment   WBC 5.6  4.0 - 10.5 K/uL    RBC 3.67 (*) 4.22 - 5.81 MIL/uL    Hemoglobin 10.4 (*) 13.0 - 17.0 g/dL    HCT 96.0 (*) 45.4 - 52.0 %    MCV 78.5  78.0 - 100.0 fL    MCH 28.3  26.0 - 34.0 pg    MCHC 36.1 (*) 30.0 - 36.0 g/dL    RDW 09.8  11.9 - 14.7 %    Platelets 71 (*) 150 - 400 K/uL CONSISTENT WITH PREVIOUS RESULT   Neutrophils Relative 52  43 - 77 %    Neutro Abs 2.9  1.7 - 7.7 K/uL    Lymphocytes Relative 33  12 - 46 %    Lymphs Abs 1.9  0.7 - 4.0 K/uL    Monocytes Relative 14 (*) 3 -  12 %    Monocytes Absolute 0.8  0.1 - 1.0 K/uL    Eosinophils Relative 1  0 - 5 %    Eosinophils Absolute 0.1  0.0 - 0.7 K/uL    Basophils Relative 0  0 - 1 %    Basophils Absolute 0.0  0.0 - 0.1 K/uL      HPI :Patient is a 22 y.o. male with a PMHx of sickle cell disease, who presented [facility] with chief complaints of fevers, headaches, back pain, hand pain and leg pain since 1 day. Patient's initial lab was suggestive of elevated white count with neutrophilic predominance. No abnormalities were seen on basic metabolic profile or chest x-ray.  The patient said that the symptoms are consistent with what he has when he gets sickle cell crisis. He was given 1 mg Dilaudid IV which provided him some relief. It was decided to admit the patient for pain control and IV hydration.  HOSPITAL COURSE:   Sickle cell crisis Assessment: Pt much improved. Will transition to oral medications; Oxycodone (1mg /ml) 5-10 mg po q 4 hours with IV dilaudid for breakthrough pain. Continue IVF for the next 24 hours and d/c when hydration restored. Add Toradol.  Sickle cell anemia Hgb [place](06/23/2012) Assessment: Hb stable. Hemoglobin electrophoresis pending. BUN mildly elevated but hemoglobin stable. Did not require any transfusion exchange transfusion. Pt states that he had been on Hydrea but it has made him ill in the past thus it was discontinued. Reticulocyte count 3.9 upon admission  Secondary Hemochromatosis  Assessment: Pt had a Ferritin level from 02/01/2013 which was elevated at 4510. I suspect that this level represents secondary hemachromatosis rather than an acute phase reactant. He is presently on Desferal during this hospitalization. I would recommend a follow-up ferritin level when in the convalescent state and consideration of Exjade by Dr. August Saucer.    Discharge Exam:Blood pressure 110/74, pulse 92, temperature 98.3 F (36.8 C), temperature source  Oral, resp. rate 18, height 5\' 8"  (1.727  m), weight 64.411 kg (142 lb), SpO2 98.00%.   General: Very pleasant gentleman. Alert, awake, oriented x3, in no acute distress. Wife at bedside with infant daughter.  HEENT: [place]/AT PEERL, EOMI, Anicteric  OROPHARYNX: Moist, No exudate/ erythema/lesions.  Heart: Regular rate and rhythm, without murmurs, rubs, gallops  Lungs: Clear to auscultation, no wheezing or rhonchi noted.  Abdomen: Soft, nontender, nondistended, positive bowel sounds, no masses no hepatosplenomegaly noted.  Neuro: No focal neurological deficits noted cranial nerves II through XII grossly intact. Strength normal in bilateral upper and lower extremities.  Musculoskeletal: No warm swelling or erythema around joints, no spinal tenderness noted         Follow-up Information    Follow up with August Saucer, ERIC, MD. Schedule an appointment as soon as possible for a visit in 1 week.   Contact information:   [address] [place] [address] [phone]          Signed: Richarda Overlie 06/24/2012, 1:27 PM

## 2012-06-27 LAB — HEMOGLOBINOPATHY EVALUATION: Hemoglobin Other: 44 % — ABNORMAL HIGH

## 2012-06-28 ENCOUNTER — Encounter (HOSPITAL_BASED_OUTPATIENT_CLINIC_OR_DEPARTMENT_OTHER): Payer: Self-pay | Admitting: *Deleted

## 2012-06-28 ENCOUNTER — Inpatient Hospital Stay (HOSPITAL_BASED_OUTPATIENT_CLINIC_OR_DEPARTMENT_OTHER)
Admission: EM | Admit: 2012-06-28 | Discharge: 2012-07-03 | DRG: 812 | Disposition: A | Discharge status: Home / Self Care | Payer: Medicaid Other | Attending: Internal Medicine | Admitting: Internal Medicine

## 2012-06-28 ENCOUNTER — Emergency Department (HOSPITAL_BASED_OUTPATIENT_CLINIC_OR_DEPARTMENT_OTHER): Payer: Medicaid Other

## 2012-06-28 DIAGNOSIS — Z9089 Acquired absence of other organs: Secondary | ICD-10-CM

## 2012-06-28 DIAGNOSIS — F17200 Nicotine dependence, unspecified, uncomplicated: Secondary | ICD-10-CM | POA: Diagnosis present

## 2012-06-28 DIAGNOSIS — R945 Abnormal results of liver function studies: Secondary | ICD-10-CM | POA: Diagnosis present

## 2012-06-28 DIAGNOSIS — R079 Chest pain, unspecified: Secondary | ICD-10-CM | POA: Diagnosis present

## 2012-06-28 DIAGNOSIS — Z791 Long term (current) use of non-steroidal anti-inflammatories (NSAID): Secondary | ICD-10-CM

## 2012-06-28 DIAGNOSIS — G8929 Other chronic pain: Secondary | ICD-10-CM | POA: Diagnosis present

## 2012-06-28 DIAGNOSIS — D571 Sickle-cell disease without crisis: Secondary | ICD-10-CM | POA: Diagnosis present

## 2012-06-28 DIAGNOSIS — D72829 Elevated white blood cell count, unspecified: Secondary | ICD-10-CM | POA: Diagnosis present

## 2012-06-28 DIAGNOSIS — R5081 Fever presenting with conditions classified elsewhere: Secondary | ICD-10-CM | POA: Diagnosis present

## 2012-06-28 DIAGNOSIS — D5700 Hb-SS disease with crisis, unspecified: Principal | ICD-10-CM | POA: Diagnosis present

## 2012-06-28 DIAGNOSIS — D5701 Hb-SS disease with acute chest syndrome: Secondary | ICD-10-CM | POA: Diagnosis present

## 2012-06-28 DIAGNOSIS — R509 Fever, unspecified: Secondary | ICD-10-CM | POA: Diagnosis present

## 2012-06-28 LAB — CBC
HCT: 31.4 % — ABNORMAL LOW (ref 39.0–52.0)
Hemoglobin: 11.8 g/dL — ABNORMAL LOW (ref 13.0–17.0)
MCHC: 37.6 g/dL — ABNORMAL HIGH (ref 30.0–36.0)
MCV: 75.1 fL — ABNORMAL LOW (ref 78.0–100.0)

## 2012-06-28 LAB — DIFFERENTIAL
Band Neutrophils: 2 % (ref 0–10)
Blasts: 0 %
Lymphocytes Relative: 52 % — ABNORMAL HIGH (ref 12–46)
Lymphs Abs: 9.2 10*3/uL — ABNORMAL HIGH (ref 0.7–4.0)
Myelocytes: 1 %
Promyelocytes Absolute: 0 %

## 2012-06-28 LAB — CK TOTAL AND CKMB (NOT AT ARMC)
CK, MB: 1.4 ng/mL (ref 0.3–4.0)
Total CK: 106 U/L (ref 7–232)

## 2012-06-28 LAB — BASIC METABOLIC PANEL
BUN: 10 mg/dL (ref 6–23)
Chloride: 101 mEq/L (ref 96–112)
GFR calc Af Amer: >90 mL/min (ref >90)
Potassium: 3.4 mEq/L — ABNORMAL LOW (ref 3.5–5.1)

## 2012-06-28 LAB — PATHOLOGIST SMEAR REVIEW: Path Review: REACTIVE

## 2012-06-28 LAB — RETICULOCYTES: RBC.: 4.16 MIL/uL — ABNORMAL LOW (ref 4.22–5.81)

## 2012-06-28 LAB — TROPONIN I: Troponin I: <0.3 ng/mL (ref <0.30)

## 2012-06-28 MED ORDER — HYDROMORPHONE HCL PF 1 MG/ML IJ SOLN
1.0000 mg | INTRAMUSCULAR | Status: DC | PRN
  Administered 2012-06-28 (×2): 1 mg via INTRAVENOUS
  Filled 2012-06-28 (×2): qty 1

## 2012-06-28 MED ORDER — HYDROMORPHONE HCL PF 1 MG/ML IJ SOLN: 1.0000 mg | INTRAMUSCULAR | Status: DC | PRN

## 2012-06-28 MED ORDER — ENOXAPARIN SODIUM 40 MG/0.4ML ~~LOC~~ SOLN
40.0000 mg | SUBCUTANEOUS | Status: DC
  Administered 2012-06-28 – 2012-07-02 (×5): 40 mg via SUBCUTANEOUS
  Filled 2012-06-28 (×6): qty 0.4

## 2012-06-28 MED ORDER — DIPHENHYDRAMINE HCL 25 MG PO CAPS
25.0000 mg | ORAL_CAPSULE | Freq: Once | ORAL | Status: AC
  Administered 2012-06-28: 25 mg via ORAL
  Filled 2012-06-28: qty 1

## 2012-06-28 MED ORDER — DOCUSATE SODIUM 100 MG PO CAPS
100.0000 mg | ORAL_CAPSULE | Freq: Two times a day (BID) | ORAL | Status: DC
  Administered 2012-06-28 – 2012-06-29 (×2): 100 mg via ORAL
  Filled 2012-06-28 (×4): qty 1

## 2012-06-28 MED ORDER — HYDROMORPHONE HCL PF 1 MG/ML IJ SOLN
1.0000 mg | Freq: Once | INTRAMUSCULAR | Status: AC
  Administered 2012-06-28: 1 mg via INTRAVENOUS
  Filled 2012-06-28: qty 1

## 2012-06-28 MED ORDER — KETOROLAC TROMETHAMINE 30 MG/ML IJ SOLN
30.0000 mg | Freq: Once | INTRAMUSCULAR | Status: AC
  Administered 2012-06-28: 30 mg via INTRAVENOUS
  Filled 2012-06-28: qty 1

## 2012-06-28 MED ORDER — ONDANSETRON HCL 4 MG/2ML IJ SOLN: 4.0000 mg | Freq: Three times a day (TID) | INTRAMUSCULAR | Status: DC | PRN

## 2012-06-28 MED ORDER — SODIUM CHLORIDE 0.9 % IV SOLN
INTRAVENOUS | Status: DC
  Administered 2012-06-28 – 2012-06-29 (×5): via INTRAVENOUS

## 2012-06-28 MED ORDER — ONDANSETRON HCL 4 MG/2ML IJ SOLN: 4.0000 mg | Freq: Four times a day (QID) | INTRAMUSCULAR | Status: DC | PRN

## 2012-06-28 MED ORDER — SODIUM CHLORIDE 0.9 % IV SOLN: INTRAVENOUS | Status: DC

## 2012-06-28 MED ORDER — SODIUM CHLORIDE 0.9 % IJ SOLN
3.0000 mL | Freq: Two times a day (BID) | INTRAMUSCULAR | Status: DC
  Administered 2012-06-29: 3 mL via INTRAVENOUS

## 2012-06-28 MED ORDER — FOLIC ACID 1 MG PO TABS
1.0000 mg | ORAL_TABLET | Freq: Every day | ORAL | Status: DC
  Administered 2012-06-28 – 2012-07-03 (×6): 1 mg via ORAL
  Filled 2012-06-28 (×6): qty 1

## 2012-06-28 MED ORDER — HYDROMORPHONE HCL PF 1 MG/ML IJ SOLN
INTRAMUSCULAR | Status: AC
  Administered 2012-06-28: 2 mg
  Filled 2012-06-28: qty 2

## 2012-06-28 MED ORDER — HYDROMORPHONE HCL PF 2 MG/ML IJ SOLN
2.0000 mg | INTRAMUSCULAR | Status: DC | PRN
  Administered 2012-06-28 – 2012-06-30 (×14): 2 mg via INTRAVENOUS
  Filled 2012-06-28 (×14): qty 1

## 2012-06-28 MED ORDER — ONDANSETRON HCL 4 MG/2ML IJ SOLN
4.0000 mg | Freq: Once | INTRAMUSCULAR | Status: AC
  Administered 2012-06-28: 4 mg via INTRAVENOUS
  Filled 2012-06-28: qty 2

## 2012-06-28 NOTE — H&P (Addendum)
[facility]          History and Physical    PCP:   August Saucer, ERIC, MD   Chief Complaint:  Generalized pain  HPI: Patient is a 22 y.o. male with a PMHx of sickle cell disease, was discharged from [facility] on Saturday after treatment for crisis, [facility] this am with chief complaints of fevers, headaches, back pain, hand pain and leg pain since Saturday night, he reports temp of 101 on Saturday and Sunday, back and leg pain associated with this and this am started experiencing chest pain which he describes as constant, sharp non radiating and non pleuritic.  The patient said that the symptoms are consistent with when he has sickle cell crisis   Allergies:   Allergies  Allergen Reactions  . Shellfish Allergy Anaphylaxis      Past Medical History  Diagnosis Date  . Sickle cell disease     Past Surgical History  Procedure Date  . Cholecystectomy     Prior to Admission medications   Medication Sig Start Date End Date Taking? Authorizing Provider  folic acid (FOLVITE) 1 MG tablet Take 1 mg by mouth daily.   Yes Historical Provider, MD  ibuprofen (ADVIL,MOTRIN) 200 MG tablet Take 600 mg by mouth every 6 (six) hours as needed. For pain   Yes Historical Provider, MD  OVER THE COUNTER MEDICATION Take 2 scoop by mouth daily. Mega men sport powder   Yes Historical Provider, MD  folic acid (FOLVITE) 1 MG tablet Take 1 tablet (1 mg total) by mouth daily. 06/23/12   Altha Harm, MD    Social History:  reports that he has been smoking.  He has never used smokeless tobacco. He reports that he does not drink alcohol or use illicit drugs.  family history.: positive for sickle cell trait  Review of Systems: positives bolded Constitutional: Denies fever, chills, diaphoresis, appetite change and fatigue.  HEENT: Denies photophobia, eye pain, redness, hearing loss, ear pain, congestion, sore throat, rhinorrhea, sneezing, mouth sores,  trouble swallowing, neck pain, neck stiffness and tinnitus.   Respiratory: Denies SOB, DOE, cough, chest tightness,  and wheezing.   Cardiovascular: Denies chest pain, palpitations and leg swelling.  Gastrointestinal: Denies nausea, vomiting, abdominal pain, diarrhea, constipation, blood in stool and abdominal distention.  Genitourinary: Denies dysuria, urgency, frequency, hematuria, flank pain and difficulty urinating.  Musculoskeletal: Denies myalgias, back pain, joint swelling, arthralgias and gait problem.  Skin: Denies pallor, rash and wound.  Neurological: Denies dizziness, seizures, syncope, weakness, light-headedness, numbness and headaches.  Hematological: Denies adenopathy. Easy bruising, personal or family bleeding history  Psychiatric/Behavioral: Denies suicidal ideation, mood changes, confusion, nervousness, sleep disturbance and agitation   Physical Exam: Blood pressure 106/57, pulse 70, temperature 97.9 F (36.6 C), temperature source Oral, resp. rate 16, SpO2 100.00%. General:Vital signs reviewed and noted. Well-developed, well-nourished, in no acute distress; alert, appropriate and cooperative throughout examination.  Head: Normocephalic, atraumatic.  Eyes: PERRL, EOMI, No signs of anemia or jaundince.  Nose: Mucous membranes moist, not inflammed, nonerythematous.  Throat: Oropharynx nonerythematous, no exudate appreciated.  Neck: No deformities, masses, or tenderness noted.Supple, No carotid Bruits, no JVD.  Lungs: Normal respiratory effort. Clear to auscultation BL without crackles or wheezes.  Heart: RRR. S1 and S2 normal without gallop, murmur, or rubs.  Abdomen: BS normoactive. Soft, Nondistended, non-tender. No masses or organomegaly.  Extremities: No pretibial edema.  Neurologic: A&O X3, CN II - XII are grossly intact. Motor strength is  5/5 in the all 4 extremities, Sensations intact to light touch, Cerebellar signs negative.  Skin: No visible rashes, scars   Labs  on Admission:  Results for orders placed during the hospital encounter of 06/28/12 (from the past 48 hour(s))  CBC     Status: Abnormal   Collection Time   06/28/12  5:40 AM      Component Value Range Comment   WBC 17.7 (*) 4.0 - 10.5 K/uL    RBC 4.18 (*) 4.22 - 5.81 MIL/uL    Hemoglobin 11.8 (*) 13.0 - 17.0 g/dL    HCT 96.0 (*) 45.4 - 52.0 %    MCV 75.1 (*) 78.0 - 100.0 fL    MCH 28.2  26.0 - 34.0 pg    MCHC 37.6 (*) 30.0 - 36.0 g/dL RULED OUT INTERFERING SUBSTANCES   RDW 14.5  11.5 - 15.5 %    Platelets 183  150 - 400 K/uL   RETICULOCYTES     Status: Abnormal   Collection Time   06/28/12  5:40 AM      Component Value Range Comment   Retic Ct Pct 5.0 (*) 0.4 - 3.1 %    RBC. 4.16 (*) 4.22 - 5.81 MIL/uL    Retic Count, Manual 208.0 (*) 19.0 - 186.0 K/uL   BASIC METABOLIC PANEL     Status: Abnormal   Collection Time   06/28/12  5:40 AM      Component Value Range Comment   Sodium 138  135 - 145 mEq/L    Potassium 3.4 (*) 3.5 - 5.1 mEq/L    Chloride 101  96 - 112 mEq/L    CO2 24  19 - 32 mEq/L    Glucose, Bld 92  70 - 99 mg/dL    BUN 10  6 - 23 mg/dL    Creatinine, Ser 0.98  0.50 - 1.35 mg/dL    Calcium 9.6  8.4 - 11.9 mg/dL    GFR calc non Af Amer >90  >90 mL/min    GFR calc Af Amer >90  >90 mL/min   DIFFERENTIAL     Status: Abnormal   Collection Time   06/28/12  5:40 AM      Component Value Range Comment   Neutrophils Relative 34 (*) 43 - 77 %    Lymphocytes Relative 52 (*) 12 - 46 %    Monocytes Relative 8  3 - 12 %    Eosinophils Relative 2  0 - 5 %    Basophils Relative 0  0 - 1 %    Band Neutrophils 2  0 - 10 %    Metamyelocytes Relative 1      Myelocytes 1      Promyelocytes Absolute 0      Blasts 0      nRBC 0  0 /100 WBC    Neutro Abs 6.7  1.7 - 7.7 K/uL    Lymphs Abs 9.2 (*) 0.7 - 4.0 K/uL    Monocytes Absolute 1.4 (*) 0.1 - 1.0 K/uL    Eosinophils Absolute 0.4  0.0 - 0.7 K/uL    Basophils Absolute 0.0  0.0 - 0.1 K/uL    RBC Morphology SCHISTOCYTES PRESENT  (2-5/hpf)      WBC Morphology ATYPICAL LYMPHOCYTES   VACUOLATED NEUTROPHILS  PATHOLOGIST SMEAR REVIEW     Status: Normal   Collection Time   06/28/12  5:40 AM      Component Value Range Comment   Path Review  Value: Absolute lymphocytosis. Favor reactive process, recommend clinical correlation.  CK TOTAL AND CKMB     Status: Normal   Collection Time   06/28/12 12:00 PM      Component Value Range Comment   Total CK 106  7 - 232 U/L    CK, MB 1.4  0.3 - 4.0 ng/mL    Relative Index 1.3  0.0 - 2.5   TROPONIN I     Status: Normal   Collection Time   06/28/12 12:00 PM      Component Value Range Comment   Troponin I <0.30  <0.30 ng/mL     Radiological Exams on Admission: Dg Chest Portable 1 View  06/28/2012  *RADIOLOGY REPORT*  Clinical Data: Chest pain.  Shortness of breath.  History of sickle cell.  PORTABLE CHEST - 1 VIEW  Comparison: 06/22/2012  Findings: Normal inspiration. The heart size and pulmonary vascularity are normal. The lungs appear clear and expanded without focal air space disease or consolidation. No blunting of the costophrenic angles.  None changes in the spine consistent with history of sickle cell.  No significant change since previous study.  IMPRESSION: No evidence of active pulmonary disease.   Original Report Authenticated By: Burman Nieves, M.D.     Assessment/Plan  1. Sickle cell crisis Admit to tele Supportive care with IVF, IV narcotics, oxygen Continue folic acid FU retic count  2.  Sickle cell anemia: pt reports intolerance to hydroxyurea  3.  Chest pain: suspect secondary to 1, EKG without acute changes, check 2 sets of enzymes, suspicion for PE low, given non-pleuritic nature of chest pain and lack of hypoxia or tachycardia. CXR clear IF clinical status changes may need CTA chest.    DVT PPX - low molecular weight heparin  CODE STATUS - Full  DISPO - Inpatient,  Anticipated discharge in approximately 2-3 day(s).     Time Spent on  Admission:  Labrina Lines [facility] Pager: [phone] 06/28/2012, 1:50 PM

## 2012-06-28 NOTE — ED Notes (Signed)
Patient with history of sickle cell disorder and today he started having chest pain.  Patient also c/o neck pain that radiates down his spine.  Also, c/o shortness of breath

## 2012-06-28 NOTE — ED Provider Notes (Signed)
History     CSN: [id number]  Arrival date & time 06/28/12  3086   First MD Initiated Contact with Patient 06/28/12 [phone]      Chief Complaint  Patient presents with  . Sickle Cell Pain Crisis    (Consider location/radiation/quality/duration/timing/severity/associated sxs/prior treatment) HPI Hx per PT. Sharp CP and back pain started at home tonight, has had the same symptoms with a sickle cell crisis and admitted last week for acute chest syndrome.  He has been taking medications at home as prescribed, PCP Dr August Saucer. No cough. Some SOB. No N/V. No hemoptysis. No rash. No leg pain or swelling. No known inciting or alleviating factors.   Past Medical History  Diagnosis Date  . Sickle cell disease     Past Surgical History  Procedure Date  . Cholecystectomy     History reviewed. No pertinent family history.  History  Substance Use Topics  . Smoking status: Current Every Day Smoker  . Smokeless tobacco: Not on file  . Alcohol Use: No      Review of Systems  Constitutional: Negative for fever and chills.  HENT: Negative for neck pain and neck stiffness.   Eyes: Negative for pain.  Respiratory: Positive for shortness of breath.   Cardiovascular: Positive for chest pain.  Gastrointestinal: Negative for abdominal pain.  Genitourinary: Negative for dysuria.  Musculoskeletal: Negative for back pain.  Skin: Negative for rash.  Neurological: Negative for headaches.  All other systems reviewed and are negative.    Allergies  Shellfish allergy  Home Medications   Current Outpatient Rx  Name  Route  Sig  Dispense  Refill  . FOLIC ACID 1 MG PO TABS   Oral   Take 1 tablet (1 mg total) by mouth daily.   30 tablet   0   . IBUPROFEN 200 MG PO TABS   Oral   Take 400 mg by mouth every 6 (six) hours as needed. For pain         . OXYCODONE HCL 5 MG/5ML PO SOLN   Oral   Take 5-10 mLs (5-10 mg total) by mouth every 4 (four) hours.   500 mL   0     BP 120/70  Pulse  80  Temp 98.2 F (36.8 C) (Oral)  Resp 22  SpO2 100%  Physical Exam  Constitutional: He is oriented to person, place, and time. He appears well-developed and well-nourished.  HENT:  Head: Normocephalic and atraumatic.  Eyes: Conjunctivae normal and EOM are normal. Pupils are equal, round, and reactive to light.  Neck: Trachea normal. Neck supple. No thyromegaly present.       No thoracic or C spine tenderness  Cardiovascular: Normal rate, regular rhythm, S1 normal, S2 normal and normal pulses.     No systolic murmur is present   No diastolic murmur is present  Pulses:      Radial pulses are 2+ on the right side, and 2+ on the left side.  Pulmonary/Chest: Effort normal and breath sounds normal. He has no wheezes. He has no rhonchi. He has no rales. He exhibits no tenderness.  Abdominal: Soft. Normal appearance and bowel sounds are normal. There is no tenderness. There is no CVA tenderness and negative Murphy's sign.  Musculoskeletal:       BLE:s Calves nontender, no cords or erythema, negative Homans sign  Neurological: He is alert and oriented to person, place, and time. He has normal strength. No cranial nerve deficit or sensory deficit. GCS eye subscore  is 4. GCS verbal subscore is 5. GCS motor subscore is 6.  Skin: Skin is warm and dry. No rash noted. He is not diaphoretic.  Psychiatric: His speech is normal.       Cooperative and appropriate    ED Course  Procedures (including critical care time)  Results for orders placed during the hospital encounter of 06/28/12  CBC      Component Value Range   WBC 17.7 (*) 4.0 - 10.5 K/uL   RBC 4.18 (*) 4.22 - 5.81 MIL/uL   Hemoglobin 11.8 (*) 13.0 - 17.0 g/dL   HCT 16.1 (*) 09.6 - 04.5 %   MCV 75.1 (*) 78.0 - 100.0 fL   MCH 28.2  26.0 - 34.0 pg   MCHC 37.6 (*) 30.0 - 36.0 g/dL   RDW 40.9  81.1 - 91.4 %   Platelets 183  150 - 400 K/uL  BASIC METABOLIC PANEL      Component Value Range   Sodium 138  135 - 145 mEq/L   Potassium 3.4  (*) 3.5 - 5.1 mEq/L   Chloride 101  96 - 112 mEq/L   CO2 24  19 - 32 mEq/L   Glucose, Bld 92  70 - 99 mg/dL   BUN 10  6 - 23 mg/dL   Creatinine, Ser 7.82  0.50 - 1.35 mg/dL   Calcium 9.6  8.4 - 95.6 mg/dL   GFR calc non Af Amer >90  >90 mL/min   GFR calc Af Amer >90  >90 mL/min   Dg Chest 2 View  06/22/2012  *RADIOLOGY REPORT*  Clinical Data: Sickle cell disease.  Head and back pain.  CHEST - 2 VIEW  Comparison: Single view of the chest 05/11/2011.  Findings: The lungs are clear.  Heart size is normal.  There is no pneumothorax or pleural effusion.  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  IMPRESSION: No acute abnormality.   Original Report Authenticated By: Holley Dexter, M.D.    Dg Chest Portable 1 View  06/28/2012  *RADIOLOGY REPORT*  Clinical Data: Chest pain.  Shortness of breath.  History of sickle cell.  PORTABLE CHEST - 1 VIEW  Comparison: 06/22/2012  Findings: Normal inspiration. The heart size and pulmonary vascularity are normal. The lungs appear clear and expanded without focal air space disease or consolidation. No blunting of the costophrenic angles.  None changes in the spine consistent with history of sickle cell.  No significant change since previous study.  IMPRESSION: No evidence of active pulmonary disease.   Original Report Authenticated By: Burman Nieves, M.D.     IVFs, IV Dilaudid. IV zofran, oxygen provided. Labs and CXR obtained.   6:33 AM MED consult, d/w DR Sharyon Medicus - he accepts PT in transfer for admit. [facility] to transport. Repeat IV Dilaudid  MDM   Sickle cell pain crisis with persistent pain despite IV narcotics. Plan MED admit        Sunnie Nielsen, MD 06/28/12 [phone]

## 2012-06-29 DIAGNOSIS — R509 Fever, unspecified: Secondary | ICD-10-CM | POA: Diagnosis present

## 2012-06-29 DIAGNOSIS — D72829 Elevated white blood cell count, unspecified: Secondary | ICD-10-CM | POA: Diagnosis present

## 2012-06-29 LAB — CBC
Hemoglobin: 10.3 g/dL — ABNORMAL LOW (ref 13.0–17.0)
MCH: 27.7 pg (ref 26.0–34.0)
MCHC: 35.8 g/dL (ref 30.0–36.0)
RDW: 15.2 % (ref 11.5–15.5)

## 2012-06-29 LAB — CULTURE, BLOOD (ROUTINE X 2): Culture: NO GROWTH

## 2012-06-29 MED ORDER — KETOROLAC TROMETHAMINE 30 MG/ML IJ SOLN
30.0000 mg | Freq: Four times a day (QID) | INTRAMUSCULAR | Status: DC
  Administered 2012-06-29 – 2012-07-03 (×16): 30 mg via INTRAVENOUS
  Filled 2012-06-29 (×21): qty 1

## 2012-06-29 MED ORDER — OXYCODONE-ACETAMINOPHEN 5-325 MG PO TABS
1.0000 | ORAL_TABLET | ORAL | Status: DC | PRN
  Administered 2012-06-29 – 2012-06-30 (×3): 1 via ORAL
  Filled 2012-06-29 (×3): qty 1

## 2012-06-29 MED ORDER — SENNOSIDES-DOCUSATE SODIUM 8.6-50 MG PO TABS
1.0000 | ORAL_TABLET | Freq: Two times a day (BID) | ORAL | Status: DC
  Administered 2012-06-29 – 2012-07-03 (×8): 1 via ORAL
  Filled 2012-06-29 (×9): qty 1

## 2012-06-29 MED ORDER — KETOROLAC TROMETHAMINE 30 MG/ML IJ SOLN
30.0000 mg | Freq: Once | INTRAMUSCULAR | Status: AC
  Administered 2012-06-29: 30 mg via INTRAVENOUS
  Filled 2012-06-29: qty 1

## 2012-06-29 MED ORDER — DEXTROSE-NACL 5-0.45 % IV SOLN
INTRAVENOUS | Status: DC
  Administered 2012-06-29 – 2012-07-02 (×8): via INTRAVENOUS

## 2012-06-29 NOTE — Progress Notes (Signed)
SICKLE CELL SERVICE PROGRESS NOTE  William Jennings:[id number] DOB: Nov 10, 1990 DOA: 06/28/2012 PCP: August Saucer ERIC, MD  Assessment/Plan: Active Problems:  Sickle cell crisis: Pt with acute VOC with elevated WBC.  Will check CMET and CRP. Also schedule dilaudid and add Toradol. Continue Percocet PRN. Will reassess pain control in the morning. May also need to consider increasing dose of Percocet at home to 10 mg.    Sickle cell anemia: Hb stable and no clinical signs of hemolysis. Pt has a low fetal Hb (1.3%) but states that he has an intolerance to Hydrea thus has not been taking it.    Chest wall Pain: Associated with VOC. Improved with pain management.   Fever: No further fever since admission.   Leukocytosis: Likely associated with VOC. Will continue to monitor.  Code Status: Full Family Communication: N/A Disposition Plan: Home.  Anelisse Jacobson A.  Pager [phone]. If 7PM-7AM, please contact night-coverage.  06/29/2012, 3:19 PM  LOS: 1 day   Brief narrative: Patient is a 22 y.o. male with a PMHx of sickle cell disease, was discharged from [facility] on Saturday after treatment for crisis, [facility] early this am with chief complaints of fevers, headaches, back pain, hand pain and leg pain since Saturday night, he reports temp of 101 on Saturday and Sunday, back and leg pain associated with this and this am started experiencing chest pain which he describes as constant, sharp non radiating and non pleuritic.  The patient said that the symptoms are consistent with when he has sickle cell crisis      Consultants:  None  Procedures:  None  Antibiotics:  None  HPI/Subjective:  Pt known to me from previous hospitalization was discharged less than a week ago. Pt states that he started having pain again within 2 days. Th e pain has escalated to the point where he was unable to manage it at home.  He also reports fever x 3 days and pain which was localized  to his chest, back and extremities. He states that his pain is improved since admission. Pain is at a 7/10 with current regimen. Pt feels that his current dose of oxycodone has become ineffective in controlling his pain at home.    Objective: Filed Vitals:   06/29/12 0158 06/29/12 0510 06/29/12 1020 06/29/12 1337  BP: 119/61 113/52 133/68 125/89  Pulse: 118 96 111 105  Temp: 101.1 F (38.4 C) 98.6 F (37 C) 98.4 F (36.9 C) 99.9 F (37.7 C)  TempSrc: Oral Oral  Oral  Resp: 16 15 16 18   Height:      Weight:      SpO2: 100% 96% 93% 100%   Weight change:   Intake/Output Summary (Last 24 hours) at 06/29/12 1519 Last data filed at 06/29/12 1338  Gross per 24 hour  Intake 3712.5 ml  Output   1600 ml  Net 2112.5 ml    General: Alert, awake, oriented x3, in mild distress.  HEENT: [place]/AT PEERL, EOMI Neck: Trachea midline,  no masses, no thyromegal,y no JVD, no carotid bruit OROPHARYNX:  Moist, No exudate/ erythema/lesions.  Heart: Regular rate and rhythm, without murmurs, rubs, gallops, PMI non-displaced, no heaves or thrills on palpation.  Lungs: Clear to auscultation, no wheezing or rhonchi noted.   Abdomen: Soft, nontender, nondistended, positive bowel sounds, no masses no hepatosplenomegaly noted.  Neuro: No focal neurological deficits noted cranial nerves II through XII grossly intact. Strength functional in bilateral upper and lower extremities. Musculoskeletal: No warm  swelling or erythema around joints, no spinal tenderness noted. Psychiatric: Patient alert and oriented x3, good insight and cognition, good recent to remote recall.    Data Reviewed: Basic Metabolic Panel:  Lab 06/28/12 1610 06/23/12 0500 06/22/12 2047  NA 138 133* 135  K 3.4* 3.7 3.5  CL 101 102 100  CO2 24 25 22   GLUCOSE 92 107* 160*  BUN 10 6 8   CREATININE 0.80 0.86 1.10  CALCIUM 9.6 8.5 9.3  MG -- -- --  PHOS -- -- --   Liver Function Tests:  Lab 06/23/12 0500  AST 61*  ALT 30  ALKPHOS 78   BILITOT 3.1*  PROT 6.9  ALBUMIN 3.6   No results found for this basename: LIPASE:5,AMYLASE:5 in the last 168 hours No results found for this basename: AMMONIA:5 in the last 168 hours CBC:  Lab 06/29/12 0404 06/28/12 0540 06/24/12 0543 06/23/12 0500 06/22/12 2047  WBC 19.2* 17.7* 5.6 9.9 13.2*  NEUTROABS -- 6.7 2.9 -- 10.1*  HGB 10.3* 11.8* 10.4* 11.3* 12.1*  HCT 28.8* 31.4* 28.8* 31.5* 33.5*  MCV 77.4* 75.1* 78.5 79.5 79.4  PLT 190 183 71* 83* 106*   Cardiac Enzymes:  Lab 06/28/12 2323 06/28/12 1727 06/28/12 1200  CKTOTAL -- -- 106  CKMB -- -- 1.4  CKMBINDEX -- -- --  TROPONINI <0.30 <0.30 <0.30   BNP (last 3 results) No results found for this basename: PROBNP:3 in the last 8760 hours CBG: No results found for this basename: GLUCAP:5 in the last 168 hours  Recent Results (from the past 240 hour(s))  CULTURE, BLOOD (ROUTINE X 2)     Status: Normal   Collection Time   06/22/12  9:42 PM      Component Value Range Status Comment   Specimen Description BLOOD LEFT ANTECUBITAL   Final    Special Requests BOTTLES DRAWN AEROBIC AND ANAEROBIC 10cc   Final    Culture  Setup Time 06/23/2012 05:59   Final    Culture NO GROWTH 5 DAYS   Final    Report Status 06/29/2012 FINAL   Final   CULTURE, BLOOD (ROUTINE X 2)     Status: Normal   Collection Time   06/22/12  9:47 PM      Component Value Range Status Comment   Specimen Description BLOOD RIGHT ARM   Final    Special Requests BOTTLES DRAWN AEROBIC AND ANAEROBIC 10cc   Final    Culture  Setup Time 06/23/2012 05:59   Final    Culture NO GROWTH 5 DAYS   Final    Report Status 06/29/2012 FINAL   Final      Studies: Dg Chest 2 View  06/22/2012  *RADIOLOGY REPORT*  Clinical Data: Sickle cell disease.  Head and back pain.  CHEST - 2 VIEW  Comparison: Single view of the chest 05/11/2011.  Findings: The lungs are clear.  Heart size is normal.  There is no pneumothorax or pleural effusion.  Biconcave deformities of the thoracic and lumbar  spine and avascular necrosis of the humeral heads consistent with sickle cell disease noted.  IMPRESSION: No acute abnormality.   Original Report Authenticated By: Holley Dexter, M.D.    Dg Chest Portable 1 View  06/28/2012  *RADIOLOGY REPORT*  Clinical Data: Chest pain.  Shortness of breath.  History of sickle cell.  PORTABLE CHEST - 1 VIEW  Comparison: 06/22/2012  Findings: Normal inspiration. The heart size and pulmonary vascularity are normal. The lungs appear clear and expanded without focal  air space disease or consolidation. No blunting of the costophrenic angles.  None changes in the spine consistent with history of sickle cell.  No significant change since previous study.  IMPRESSION: No evidence of active pulmonary disease.   Original Report Authenticated By: Burman Nieves, M.D.     Scheduled Meds:   . docusate sodium  100 mg Oral BID  . enoxaparin (LOVENOX) injection  40 mg Subcutaneous Q24H  . folic acid  1 mg Oral Daily  . ketorolac  30 mg Intravenous Q6H  . sodium chloride  3 mL Intravenous Q12H   Continuous Infusions:   . dextrose 5 % and 0.45% NaCl 125 mL/hr at 06/29/12 1230    Active Problems:  Sickle cell crisis  Sickle cell anemia  Chest pain  Fever  Leukocytosis

## 2012-06-30 LAB — COMPREHENSIVE METABOLIC PANEL
Albumin: 3.1 g/dL — ABNORMAL LOW (ref 3.5–5.2)
BUN: 5 mg/dL — ABNORMAL LOW (ref 6–23)
Calcium: 8.7 mg/dL (ref 8.4–10.5)
Creatinine, Ser: 0.74 mg/dL (ref 0.50–1.35)
Total Protein: 6.9 g/dL (ref 6.0–8.3)

## 2012-06-30 LAB — CBC WITH DIFFERENTIAL/PLATELET
Basophils Absolute: 0.1 10*3/uL (ref 0.0–0.1)
Lymphs Abs: 3.4 10*3/uL (ref 0.7–4.0)
MCH: 27.6 pg (ref 26.0–34.0)
MCV: 76.7 fL — ABNORMAL LOW (ref 78.0–100.0)
Monocytes Absolute: 2.1 10*3/uL — ABNORMAL HIGH (ref 0.1–1.0)
Neutrophils Relative %: 60 % (ref 43–77)
Platelets: 161 10*3/uL (ref 150–400)
RDW: 15.4 % (ref 11.5–15.5)
WBC: 14.8 10*3/uL — ABNORMAL HIGH (ref 4.0–10.5)

## 2012-06-30 LAB — HGB ELECTROPHORESIS REFLEXED REPORT
Hemoglobin A - HGBRFX: 0 % — ABNORMAL LOW (ref >96.0)
Hemoglobin Elect C: 45.2 % — ABNORMAL HIGH
Hemoglobin F - HGBRFX: 0.6 % (ref <2.0)

## 2012-06-30 LAB — HIGH SENSITIVITY CRP: CRP, High Sensitivity: >80 mg/L — ABNORMAL HIGH

## 2012-06-30 MED ORDER — HYDROMORPHONE HCL PF 2 MG/ML IJ SOLN
2.0000 mg | INTRAMUSCULAR | Status: DC | PRN
  Administered 2012-06-30 – 2012-07-03 (×13): 2 mg via INTRAVENOUS
  Filled 2012-06-30 (×13): qty 1

## 2012-06-30 MED ORDER — HYDROXYUREA 500 MG PO CAPS
500.0000 mg | ORAL_CAPSULE | Freq: Every day | ORAL | Status: DC
  Administered 2012-06-30 – 2012-07-03 (×4): 500 mg via ORAL
  Filled 2012-06-30 (×4): qty 1

## 2012-06-30 MED ORDER — HYDROMORPHONE HCL PF 1 MG/ML IJ SOLN: 1.0000 mg | INTRAMUSCULAR | Status: DC | PRN

## 2012-06-30 MED ORDER — LACTULOSE 10 GM/15ML PO SOLN
10.0000 g | Freq: Two times a day (BID) | ORAL | Status: DC | PRN
  Administered 2012-06-30: 10 g via ORAL
  Filled 2012-06-30: qty 30

## 2012-06-30 MED ORDER — OXYCODONE HCL 5 MG PO TABS
5.0000 mg | ORAL_TABLET | ORAL | Status: DC
  Administered 2012-06-30 – 2012-07-02 (×11): 5 mg via ORAL
  Filled 2012-06-30 (×11): qty 1

## 2012-06-30 MED ORDER — OXYCODONE-ACETAMINOPHEN 5-325 MG PO TABS
1.0000 | ORAL_TABLET | ORAL | Status: DC
  Administered 2012-06-30 – 2012-07-02 (×11): 1 via ORAL
  Filled 2012-06-30 (×11): qty 1

## 2012-06-30 MED ORDER — HYDROXYUREA 100 MG/ML ORAL SUSPENSION: 500.0000 mg | Freq: Every day | ORAL | Status: DC

## 2012-06-30 NOTE — Progress Notes (Signed)
  Pharmacy Note (Brief) - Hydrea   Order for hydroxyurea suspension 500mg  PO daily. This is not something stocked by [facility] or [facility]. Discussed this with Dr. Ashley Royalty who was ok changing to the tablet/capsule as long as the patient was agreeable. Spoke with Mr. Cosby via telephone and informed him that we don't stock this, it could potentially be compounded at an outside pharmacy, but we wouldn't get it until Monday at the earliest. Patient was agreeable to changing the order to hydrea capsules.  New order has been placed for hydroxyurea 500mg  PO caps daily, with permission from Dr. Ashley Royalty and Mr. Marcy.  Darrol Angel, PharmD Pager: [phone] 06/30/2012 6:56 PM

## 2012-06-30 NOTE — Progress Notes (Signed)
SICKLE CELL SERVICE PROGRESS NOTE  William Jennings UJW:[id number] DOB: Dec 13, 1990 DOA: 06/28/2012 PCP: August Saucer ERIC, MD  Assessment/Plan: Active Problems:  Sickle cell crisis: Pt with acute VOC with elevated WBC. CRP markedly elevated likely indicating a markedly inflammatory state as patient has no other signs of infection. He has likely had some new bone ionfarcts. Continue Toradol and re-check CRP in 48 hours. Pt feels that his pain is better controlled but still intense.  He feels that the current dose of percocet has been ineffective in controlling pain. Will add 5 mg of Oxycodone to be given with Percocet. If pain continues to improve will wean off PRN IV dilaudid and D/C home of at least Percoet 5/325 and oxycodone 5 mg.   Sickle cell anemia: Hb stable and no clinical signs of hemolysis. Pt has a low fetal Hb (1.3%) but states that he has an intolerance to Hydrea thus has not been taking it. I have discussed the use of hydrea and patient is willing to try the suspension. Will order 500 mg daily.     Chest wall Pain: Associated with VOC. Improved with pain management.   Fever: No further fever since admission.   Leukocytosis: Likely associated with VOC. Will continue to monitor.  Code Status: Full Family Communication: N/A Disposition Plan: Home.  Theseus Birnie A.  Pager [phone]. If 7PM-7AM, please contact night-coverage.  06/30/2012, 5:42 PM  LOS: 2 days   Brief narrative: Patient is a 22 y.o. male with a PMHx of sickle cell disease, was discharged from [facility] on Saturday after treatment for crisis, [facility] am with chief complaints of fevers, headaches, back pain, hand pain and leg pain since Saturday night, he reports temp of 101 on Saturday and Sunday, back and leg pain associated with this and this am started experiencing chest pain which he describes as constant, sharp non radiating and non pleuritic.  The patient said that the symptoms  are consistent with when he has sickle cell crisis      Consultants:  None  Procedures:  None  Antibiotics:  None  HPI/Subjective:  Pt states that pain is markedly improved in his chest wall and is mostly localized to back and legs. He rates pain at an intensity of 6/10 but sometimes getting up to 7/10. Pt states that the pain was still too intense to allow ambulation today.  I have discussed the use of Hydrea in light of the low fetal Hb. I have also discussed the markedly elevated CRP and it's implication in his disease process.  Objective: Filed Vitals:   06/30/12 0225 06/30/12 0655 06/30/12 1020 06/30/12 1427  BP: 131/64 122/52 118/62 124/52  Pulse: 95 77 88 91  Temp: 99.2 F (37.3 C) 98.3 F (36.8 C) 100.1 F (37.8 C) 98.6 F (37 C)  TempSrc: Oral Oral Oral Oral  Resp: 20 16 16 20   Height:      Weight:  63.6 kg (140 lb 3.4 oz)    SpO2: 96% 100% 98% 94%   Weight change: 1 kg (2 lb 3.3 oz)  Intake/Output Summary (Last 24 hours) at 06/30/12 1742 Last data filed at 06/30/12 1428  Gross per 24 hour  Intake 2787.5 ml  Output   2500 ml  Net  287.5 ml    General: Alert, awake, oriented x3, in mild distress.  HEENT: [place]/AT PEERL, EOMI, Anicteric  Heart: Regular rate and rhythm, without murmurs, rubs, gallops, PMI non-displaced, no heaves or thrills on palpation.  Lungs: Clear to auscultation, no wheezing or rhonchi noted.   Abdomen: Soft, nontender, nondistended, positive bowel sounds, no masses no hepatosplenomegaly noted.  Neuro: No focal neurological deficits noted cranial nerves II through XII grossly intact. Musculoskeletal: No warm swelling or erythema around joints, no spinal tenderness noted. However legs very tender to touch Psychiatric: Patient alert and oriented x3, good insight and cognition, good recent to remote recall.    Data Reviewed: Basic Metabolic Panel:  Lab 06/30/12 1610 06/28/12 0540  NA 135 138  K 3.5 3.4*  CL 102 101  CO2 25 24    GLUCOSE 111* 92  BUN 5* 10  CREATININE 0.74 0.80  CALCIUM 8.7 9.6  MG -- --  PHOS -- --   Liver Function Tests:  Lab 06/30/12 0358  AST 55*  ALT 54*  ALKPHOS 214*  BILITOT 2.4*  PROT 6.9  ALBUMIN 3.1*   No results found for this basename: LIPASE:5,AMYLASE:5 in the last 168 hours No results found for this basename: AMMONIA:5 in the last 168 hours CBC:  Lab 06/30/12 0358 06/29/12 0404 06/28/12 0540 06/24/12 0543  WBC 14.8* 19.2* 17.7* 5.6  NEUTROABS 8.9* -- 6.7 2.9  HGB 9.7* 10.3* 11.8* 10.4*  HCT 27.0* 28.8* 31.4* 28.8*  MCV 76.7* 77.4* 75.1* 78.5  PLT 161 190 183 71*   Cardiac Enzymes:  Lab 06/28/12 2323 06/28/12 1727 06/28/12 1200  CKTOTAL -- -- 106  CKMB -- -- 1.4  CKMBINDEX -- -- --  TROPONINI <0.30 <0.30 <0.30   BNP (last 3 results) No results found for this basename: PROBNP:3 in the last 8760 hours CBG: No results found for this basename: GLUCAP:5 in the last 168 hours  Recent Results (from the past 240 hour(s))  CULTURE, BLOOD (ROUTINE X 2)     Status: Normal   Collection Time   06/22/12  9:42 PM      Component Value Range Status Comment   Specimen Description BLOOD LEFT ANTECUBITAL   Final    Special Requests BOTTLES DRAWN AEROBIC AND ANAEROBIC 10cc   Final    Culture  Setup Time 06/23/2012 05:59   Final    Culture NO GROWTH 5 DAYS   Final    Report Status 06/29/2012 FINAL   Final   CULTURE, BLOOD (ROUTINE X 2)     Status: Normal   Collection Time   06/22/12  9:47 PM      Component Value Range Status Comment   Specimen Description BLOOD RIGHT ARM   Final    Special Requests BOTTLES DRAWN AEROBIC AND ANAEROBIC 10cc   Final    Culture  Setup Time 06/23/2012 05:59   Final    Culture NO GROWTH 5 DAYS   Final    Report Status 06/29/2012 FINAL   Final   CULTURE, BLOOD (ROUTINE X 2)     Status: Normal (Preliminary result)   Collection Time   06/29/12  5:50 AM      Component Value Range Status Comment   Specimen Description BLOOD RIGHT FOREARM   Final     Special Requests BOTTLES DRAWN AEROBIC AND ANAEROBIC 10CC   Final    Culture  Setup Time 06/29/2012 08:36   Final    Culture     Final    Value:        BLOOD CULTURE RECEIVED NO GROWTH TO DATE CULTURE WILL BE HELD FOR 5 DAYS BEFORE ISSUING A FINAL NEGATIVE REPORT   Report Status PENDING   Incomplete   CULTURE, BLOOD (  ROUTINE X 2)     Status: Normal (Preliminary result)   Collection Time   06/29/12  6:00 AM      Component Value Range Status Comment   Specimen Description BLOOD LEFT HAND   Final    Special Requests BOTTLES DRAWN AEROBIC AND ANAEROBIC 10CC   Final    Culture  Setup Time 06/29/2012 08:36   Final    Culture     Final    Value:        BLOOD CULTURE RECEIVED NO GROWTH TO DATE CULTURE WILL BE HELD FOR 5 DAYS BEFORE ISSUING A FINAL NEGATIVE REPORT   Report Status PENDING   Incomplete      Studies: Dg Chest 2 View  06/22/2012  *RADIOLOGY REPORT*  Clinical Data: Sickle cell disease.  Head and back pain.  CHEST - 2 VIEW  Comparison: Single view of the chest 05/11/2011.  Findings: The lungs are clear.  Heart size is normal.  There is no pneumothorax or pleural effusion.  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  IMPRESSION: No acute abnormality.   Original Report Authenticated By: Holley Dexter, M.D.    Dg Chest Portable 1 View  06/28/2012  *RADIOLOGY REPORT*  Clinical Data: Chest pain.  Shortness of breath.  History of sickle cell.  PORTABLE CHEST - 1 VIEW  Comparison: 06/22/2012  Findings: Normal inspiration. The heart size and pulmonary vascularity are normal. The lungs appear clear and expanded without focal air space disease or consolidation. No blunting of the costophrenic angles.  None changes in the spine consistent with history of sickle cell.  No significant change since previous study.  IMPRESSION: No evidence of active pulmonary disease.   Original Report Authenticated By:  Burman Nieves, M.D.     Scheduled Meds:    . enoxaparin (LOVENOX) injection  40 mg Subcutaneous Q24H  . folic acid  1 mg Oral Daily  . ketorolac  30 mg Intravenous Q6H  . oxyCODONE  5 mg Oral Q4H  . oxyCODONE-acetaminophen  1 tablet Oral Q4H  . senna-docusate  1 tablet Oral BID  . sodium chloride  3 mL Intravenous Q12H   Continuous Infusions:    . dextrose 5 % and 0.45% NaCl 50 mL/hr at 06/30/12 1648    Active Problems:  Sickle cell crisis  Sickle cell anemia  Chest pain  Fever  Leukocytosis

## 2012-07-01 DIAGNOSIS — D5700 Hb-SS disease with crisis, unspecified: Principal | ICD-10-CM

## 2012-07-01 DIAGNOSIS — D72829 Elevated white blood cell count, unspecified: Secondary | ICD-10-CM

## 2012-07-01 DIAGNOSIS — D5701 Hb-SS disease with acute chest syndrome: Secondary | ICD-10-CM

## 2012-07-01 LAB — CBC WITH DIFFERENTIAL/PLATELET
Basophils Absolute: 0.1 10*3/uL (ref 0.0–0.1)
Basophils Relative: 1 % (ref 0–1)
Eosinophils Relative: 3 % (ref 0–5)
HCT: 25.7 % — ABNORMAL LOW (ref 39.0–52.0)
Hemoglobin: 9.2 g/dL — ABNORMAL LOW (ref 13.0–17.0)
Lymphocytes Relative: 33 % (ref 12–46)
Monocytes Relative: 13 % — ABNORMAL HIGH (ref 3–12)
Neutro Abs: 5.8 10*3/uL (ref 1.7–7.7)
RBC: 3.34 MIL/uL — ABNORMAL LOW (ref 4.22–5.81)
RDW: 15.4 % (ref 11.5–15.5)
WBC: 11.7 10*3/uL — ABNORMAL HIGH (ref 4.0–10.5)

## 2012-07-01 NOTE — Progress Notes (Signed)
SICKLE CELL SERVICE PROGRESS NOTE  William Jennings ZOX:[id number] DOB: [birth year] DOA: 06/28/2012 PCP: August Saucer ERIC, MD  Assessment/Plan: Active Problems:  Sickle cell crisis: Pt with acute VOC with elevated WBC. CRP markedly elevated likely indicating a markedly inflammatory state as patient has no other signs of infection. He has likely had some new bone ionfarcts. Continue Toradol and re-check CRP in 24 hours. Patient feels he is slowly improving.   Sickle cell anemia: Hb stable and no clinical signs of hemolysis. Pt has a low fetal Hb (1.3%). Continue Hydrea 500 mg daily.     Chest wall Pain: Associated with VOC. Improved with pain management.   Fever: No further fever since admission.   Leukocytosis: Likely associated with VOC. Will continue to monitor.  Code Status: Full Family Communication: N/A Disposition Plan: Home.  Carollee Massed  Pager [phone] If 7PM-7AM, please contact night-coverage.  07/01/2012, 1:00 PM  LOS: 3 days   Brief narrative: Patient is a 22 y.o. male with a PMHx of sickle cell disease, was discharged from [facility] on Saturday after treatment for crisis, [facility] this am with chief complaints of fevers, headaches, back pain, hand pain and leg pain since Saturday night, he reports temp of 101 on Saturday and Sunday, back and leg pain associated with this and this am started experiencing chest pain which he describes as constant, sharp non radiating and non pleuritic.  The patient said that the symptoms are consistent with when he has sickle cell crisis      Consultants:  None  Procedures:  None  Antibiotics:  None  HPI/Subjective:   Patient is feeling much better today. His pain is 7/10.   Objective: Filed Vitals:   06/30/12 2245 07/01/12 0205 07/01/12 0557 07/01/12 0941  BP: 107/62 130/61 121/58 105/59  Pulse: 84 89 83 84  Temp: 98.6 F (37 C) 97.8 F (36.6 C) 97.9 F (36.6 C) 97.9 F (36.6 C)    TempSrc: Oral Oral Oral Oral  Resp: 16 16 16 18   Height:      Weight:  62.4 kg (137 lb 9.1 oz)    SpO2: 100% 100% 96% 100%   Weight change: -1.2 kg (-2 lb 10.3 oz)  Intake/Output Summary (Last 24 hours) at 07/01/12 1300 Last data filed at 07/01/12 0600  Gross per 24 hour  Intake   2250 ml  Output   1500 ml  Net    750 ml    General: Alert, awake, oriented x3, in mild distress.  HEENT: [place]/AT PEERL, EOMI, Anicteric  Heart: Regular rate and rhythm, without murmurs, rubs, gallops, PMI non-displaced, no heaves or thrills on palpation.  Lungs: Clear to auscultation, no wheezing or rhonchi noted.   Abdomen: Soft, nontender, nondistended, positive bowel sounds, no masses no hepatosplenomegaly noted.  Neuro: No focal neurological deficits noted cranial nerves II through XII grossly intact. Musculoskeletal: No warm swelling or erythema around joints, no spinal tenderness noted. However legs very tender to touch Psychiatric: Patient alert.  Data Reviewed: Basic Metabolic Panel:  Lab 06/30/12 8295 06/28/12 0540  NA 135 138  K 3.5 3.4*  CL 102 101  CO2 25 24  GLUCOSE 111* 92  BUN 5* 10  CREATININE 0.74 0.80  CALCIUM 8.7 9.6  MG -- --  PHOS -- --   Liver Function Tests:  Lab 06/30/12 0358  AST 55*  ALT 54*  ALKPHOS 214*  BILITOT 2.4*  PROT 6.9  ALBUMIN 3.1*    CBC:  Lab 07/01/12 0416 06/30/12 0358 06/29/12 0404 06/28/12 0540  WBC 11.7* 14.8* 19.2* 17.7*  NEUTROABS 5.8 8.9* -- 6.7  HGB 9.2* 9.7* 10.3* 11.8*  HCT 25.7* 27.0* 28.8* 31.4*  MCV 76.9* 76.7* 77.4* 75.1*  PLT 139* 161 190 183   Cardiac Enzymes:  Lab 06/28/12 2323 06/28/12 1727 06/28/12 1200  CKTOTAL -- -- 106  CKMB -- -- 1.4  CKMBINDEX -- -- --  TROPONINI <0.30 <0.30 <0.30    Recent Results (from the past 240 hour(s))  CULTURE, BLOOD (ROUTINE X 2)     Status: Normal   Collection Time   06/22/12  9:42 PM      Component Value Range Status Comment   Specimen Description BLOOD LEFT ANTECUBITAL   Final     Special Requests BOTTLES DRAWN AEROBIC AND ANAEROBIC 10cc   Final    Culture  Setup Time 06/23/2012 05:59   Final    Culture NO GROWTH 5 DAYS   Final    Report Status 06/29/2012 FINAL   Final   CULTURE, BLOOD (ROUTINE X 2)     Status: Normal   Collection Time   06/22/12  9:47 PM      Component Value Range Status Comment   Specimen Description BLOOD RIGHT ARM   Final    Special Requests BOTTLES DRAWN AEROBIC AND ANAEROBIC 10cc   Final    Culture  Setup Time 06/23/2012 05:59   Final    Culture NO GROWTH 5 DAYS   Final    Report Status 06/29/2012 FINAL   Final   CULTURE, BLOOD (ROUTINE X 2)     Status: Normal (Preliminary result)   Collection Time   06/29/12  5:50 AM      Component Value Range Status Comment   Specimen Description BLOOD RIGHT FOREARM   Final    Special Requests BOTTLES DRAWN AEROBIC AND ANAEROBIC 10CC   Final    Culture  Setup Time 06/29/2012 08:36   Final    Culture     Final    Value:        BLOOD CULTURE RECEIVED NO GROWTH TO DATE CULTURE WILL BE HELD FOR 5 DAYS BEFORE ISSUING A FINAL NEGATIVE REPORT   Report Status PENDING   Incomplete   CULTURE, BLOOD (ROUTINE X 2)     Status: Normal (Preliminary result)   Collection Time   06/29/12  6:00 AM      Component Value Range Status Comment   Specimen Description BLOOD LEFT HAND   Final    Special Requests BOTTLES DRAWN AEROBIC AND ANAEROBIC 10CC   Final    Culture  Setup Time 06/29/2012 08:36   Final    Culture     Final    Value:        BLOOD CULTURE RECEIVED NO GROWTH TO DATE CULTURE WILL BE HELD FOR 5 DAYS BEFORE ISSUING A FINAL NEGATIVE REPORT   Report Status PENDING   Incomplete      Studies: Dg Chest 2 View  06/22/2012  *RADIOLOGY REPORT*  Clinical Data: Sickle cell disease.  Head and back pain.  CHEST - 2 VIEW  Comparison: Single view of the chest 05/11/2011.  Findings: The lungs are clear.  Heart size is normal.  There is no pneumothorax or pleural effusion.  Biconcave deformities of the thoracic and lumbar  spine and avascular necrosis of the humeral heads consistent with sickle cell disease noted.  IMPRESSION: No acute abnormality.   Original Report Authenticated By: Holley Dexter, M.D.  Dg Chest Portable 1 View  06/28/2012  *RADIOLOGY REPORT*  Clinical Data: Chest pain.  Shortness of breath.  History of sickle cell.  PORTABLE CHEST - 1 VIEW  Comparison: 06/22/2012  Findings: Normal inspiration. The heart size and pulmonary vascularity are normal. The lungs appear clear and expanded without focal air space disease or consolidation. No blunting of the costophrenic angles.  None changes in the spine consistent with history of sickle cell.  No significant change since previous study.  IMPRESSION: No evidence of active pulmonary disease.   Original Report Authenticated By: Burman Nieves, M.D.     Scheduled Meds:    . enoxaparin (LOVENOX) injection  40 mg Subcutaneous Q24H  . folic acid  1 mg Oral Daily  . hydroxyurea  500 mg Oral Daily  . ketorolac  30 mg Intravenous Q6H  . oxyCODONE  5 mg Oral Q4H  . oxyCODONE-acetaminophen  1 tablet Oral Q4H  . senna-docusate  1 tablet Oral BID  . sodium chloride  3 mL Intravenous Q12H   Continuous Infusions:    . dextrose 5 % and 0.45% NaCl 50 mL/hr at 07/01/12 0425    Active Problems:  Sickle cell crisis  Sickle cell anemia  Chest pain  Fever  Leukocytosis

## 2012-07-02 LAB — CBC WITH DIFFERENTIAL/PLATELET
Eosinophils Relative: 2 % (ref 0–5)
HCT: 24.7 % — ABNORMAL LOW (ref 39.0–52.0)
Lymphs Abs: 2.9 10*3/uL (ref 0.7–4.0)
MCV: 77.4 fL — ABNORMAL LOW (ref 78.0–100.0)
Monocytes Relative: 9 % (ref 3–12)
Neutro Abs: 10.2 10*3/uL — ABNORMAL HIGH (ref 1.7–7.7)
RBC: 3.19 MIL/uL — ABNORMAL LOW (ref 4.22–5.81)
RDW: 15.6 % — ABNORMAL HIGH (ref 11.5–15.5)
WBC: 14.7 10*3/uL — ABNORMAL HIGH (ref 4.0–10.5)

## 2012-07-02 LAB — HIGH SENSITIVITY CRP: CRP, High Sensitivity: 69.2 mg/L — ABNORMAL HIGH

## 2012-07-02 MED ORDER — OXYCODONE HCL 5 MG PO TABS
5.0000 mg | ORAL_TABLET | Freq: Four times a day (QID) | ORAL | Status: DC
  Administered 2012-07-02 – 2012-07-03 (×5): 5 mg via ORAL
  Filled 2012-07-02 (×5): qty 1

## 2012-07-02 NOTE — Progress Notes (Signed)
SICKLE CELL SERVICE PROGRESS NOTE  DUELL HOLDREN AVW:[id number] DOB: 07-24-1990 DOA: 06/28/2012 PCP: August Saucer ERIC, MD  Assessment/Plan: Active Problems:  Sickle cell crisis: Pt with acute VOC with elevated WBC. CRP markedly elevated likely indicating a markedly inflammatory state as patient has no other signs of infection. He has likely had some new bone ionfarcts. Continue Toradol  Patient feels he is slowly improving. CRP is still elevated. Patient feels like taking the oxycodone Percocet and Toradol is making him worse. He states that when he wakes up after getting all of medications he feels extremely bad. Discontinue Percocet and continue with the oxycodone and the Toradol with when necessary Dilaudid.   Sickle cell anemia: Hb stable and no clinical signs of hemolysis. Pt has a low fetal Hb (1.3%). Continue Hydrea 500 mg daily.     Chest wall Pain: Associated with VOC. Improved with pain management.   Fever: No further fever since admission.   Leukocytosis: Likely associated with VOC. Will continue to monitor.  Code Status: Full Family Communication: N/A Disposition Plan: Home.  Carollee Massed  Pager [phone] If 7PM-7AM, please contact night-coverage.  07/01/2012, 1:00 PM  LOS: 3 days   Brief narrative: Patient is a 22 y.o. male with a PMHx of sickle cell disease, was discharged from [facility] on Saturday after treatment for crisis, [facility] am with chief complaints of fevers, headaches, back pain, hand pain and leg pain since Saturday night, he reports temp of 101 on Saturday and Sunday, back and leg pain associated with this and this am started experiencing chest pain which he describes as constant, sharp non radiating and non pleuritic.  The patient said that the symptoms are consistent with when he has sickle cell crisis      Consultants:  None  Procedures:  None  Antibiotics:  None  HPI/Subjective: Patient states that his  pain today is a 7/10. He does not feel he is improving.   Patient is feeling much better today. His pain is 7/10.   Objective: Filed Vitals:   06/30/12 2245 07/01/12 0205 07/01/12 0557 07/01/12 0941  BP: 107/62 130/61 121/58 105/59  Pulse: 84 89 83 84  Temp: 98.6 F (37 C) 97.8 F (36.6 C) 97.9 F (36.6 C) 97.9 F (36.6 C)  TempSrc: Oral Oral Oral Oral  Resp: 16 16 16 18   Height:      Weight:  62.4 kg (137 lb 9.1 oz)    SpO2: 100% 100% 96% 100%   Weight change: -1.2 kg (-2 lb 10.3 oz)  Intake/Output Summary (Last 24 hours) at 07/01/12 1300 Last data filed at 07/01/12 0600  Gross per 24 hour  Intake   2250 ml  Output   1500 ml  Net    750 ml    General: Alert, awake, oriented x3, in mild distress.  HEENT: [place]/AT PEERL, EOMI, Anicteric  Heart: Regular rate and rhythm, without murmurs, rubs, gallops, PMI non-displaced, no heaves or thrills on palpation.  Lungs: Clear to auscultation, no wheezing or rhonchi noted.   Abdomen: Soft, nontender, nondistended, positive bowel sounds, no masses no hepatosplenomegaly noted.  Neuro: No focal neurological deficits noted cranial nerves II through XII grossly intact. Musculoskeletal: No warm swelling or erythema around joints, no spinal tenderness noted. However legs very tender to touch Psychiatric: Patient alert.  Data Reviewed: Basic Metabolic Panel:  Lab 06/30/12 3086 06/28/12 0540  NA 135 138  K 3.5 3.4*  CL 102 101  CO2  25 24  GLUCOSE 111* 92  BUN 5* 10  CREATININE 0.74 0.80  CALCIUM 8.7 9.6  MG -- --  PHOS -- --   Liver Function Tests:  Lab 06/30/12 0358  AST 55*  ALT 54*  ALKPHOS 214*  BILITOT 2.4*  PROT 6.9  ALBUMIN 3.1*    CBC:  Lab 07/01/12 0416 06/30/12 0358 06/29/12 0404 06/28/12 0540  WBC 11.7* 14.8* 19.2* 17.7*  NEUTROABS 5.8 8.9* -- 6.7  HGB 9.2* 9.7* 10.3* 11.8*  HCT 25.7* 27.0* 28.8* 31.4*  MCV 76.9* 76.7* 77.4* 75.1*  PLT 139* 161 190 183   Cardiac Enzymes:  Lab 06/28/12 2323 06/28/12  1727 06/28/12 1200  CKTOTAL -- -- 106  CKMB -- -- 1.4  CKMBINDEX -- -- --  TROPONINI <0.30 <0.30 <0.30    Recent Results (from the past 240 hour(s))  CULTURE, BLOOD (ROUTINE X 2)     Status: Normal   Collection Time   06/22/12  9:42 PM      Component Value Range Status Comment   Specimen Description BLOOD LEFT ANTECUBITAL   Final    Special Requests BOTTLES DRAWN AEROBIC AND ANAEROBIC 10cc   Final    Culture  Setup Time 06/23/2012 05:59   Final    Culture NO GROWTH 5 DAYS   Final    Report Status 06/29/2012 FINAL   Final   CULTURE, BLOOD (ROUTINE X 2)     Status: Normal   Collection Time   06/22/12  9:47 PM      Component Value Range Status Comment   Specimen Description BLOOD RIGHT ARM   Final    Special Requests BOTTLES DRAWN AEROBIC AND ANAEROBIC 10cc   Final    Culture  Setup Time 06/23/2012 05:59   Final    Culture NO GROWTH 5 DAYS   Final    Report Status 06/29/2012 FINAL   Final   CULTURE, BLOOD (ROUTINE X 2)     Status: Normal (Preliminary result)   Collection Time   06/29/12  5:50 AM      Component Value Range Status Comment   Specimen Description BLOOD RIGHT FOREARM   Final    Special Requests BOTTLES DRAWN AEROBIC AND ANAEROBIC 10CC   Final    Culture  Setup Time 06/29/2012 08:36   Final    Culture     Final    Value:        BLOOD CULTURE RECEIVED NO GROWTH TO DATE CULTURE WILL BE HELD FOR 5 DAYS BEFORE ISSUING A FINAL NEGATIVE REPORT   Report Status PENDING   Incomplete   CULTURE, BLOOD (ROUTINE X 2)     Status: Normal (Preliminary result)   Collection Time   06/29/12  6:00 AM      Component Value Range Status Comment   Specimen Description BLOOD LEFT HAND   Final    Special Requests BOTTLES DRAWN AEROBIC AND ANAEROBIC 10CC   Final    Culture  Setup Time 06/29/2012 08:36   Final    Culture     Final    Value:        BLOOD CULTURE RECEIVED NO GROWTH TO DATE CULTURE WILL BE HELD FOR 5 DAYS BEFORE ISSUING A FINAL NEGATIVE REPORT   Report Status PENDING   Incomplete       Studies: Dg Chest 2 View  06/22/2012  *RADIOLOGY REPORT*  Clinical Data: Sickle cell disease.  Head and back pain.  CHEST - 2 VIEW  Comparison: Single view of  the chest 05/11/2011.  Findings: The lungs are clear.  Heart size is normal.  There is no pneumothorax or pleural effusion.  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  IMPRESSION: No acute abnormality.   Original Report Authenticated By: Holley Dexter, M.D.    Dg Chest Portable 1 View  06/28/2012  *RADIOLOGY REPORT*  Clinical Data: Chest pain.  Shortness of breath.  History of sickle cell.  PORTABLE CHEST - 1 VIEW  Comparison: 06/22/2012  Findings: Normal inspiration. The heart size and pulmonary vascularity are normal. The lungs appear clear and expanded without focal air space disease or consolidation. No blunting of the costophrenic angles.  None changes in the spine consistent with history of sickle cell.  No significant change since previous study.  IMPRESSION: No evidence of active pulmonary disease.   Original Report Authenticated By: Burman Nieves, M.D.     Scheduled Meds:    . enoxaparin (LOVENOX) injection  40 mg Subcutaneous Q24H  . folic acid  1 mg Oral Daily  . hydroxyurea  500 mg Oral Daily  . ketorolac  30 mg Intravenous Q6H  . oxyCODONE  5 mg Oral Q4H  . oxyCODONE-acetaminophen  1 tablet Oral Q4H  . senna-docusate  1 tablet Oral BID  . sodium chloride  3 mL Intravenous Q12H   Continuous Infusions:    . dextrose 5 % and 0.45% NaCl 50 mL/hr at 07/01/12 0425    Active Problems:  Sickle cell crisis  Sickle cell anemia  Chest pain  Fever  Leukocytosis

## 2012-07-03 LAB — COMPREHENSIVE METABOLIC PANEL WITH GFR
ALT: 135 U/L — ABNORMAL HIGH (ref 0–53)
AST: 187 U/L — ABNORMAL HIGH (ref 0–37)
Albumin: 3 g/dL — ABNORMAL LOW (ref 3.5–5.2)
Alkaline Phosphatase: 188 U/L — ABNORMAL HIGH (ref 39–117)
BUN: 7 mg/dL (ref 6–23)
CO2: 27 meq/L (ref 19–32)
Calcium: 8.9 mg/dL (ref 8.4–10.5)
Chloride: 102 meq/L (ref 96–112)
Creatinine, Ser: 0.76 mg/dL (ref 0.50–1.35)
GFR calc Af Amer: >90 mL/min
GFR calc non Af Amer: >90 mL/min
Glucose, Bld: 102 mg/dL — ABNORMAL HIGH (ref 70–99)
Potassium: 4 meq/L (ref 3.5–5.1)
Sodium: 136 meq/L (ref 135–145)
Total Bilirubin: 2 mg/dL — ABNORMAL HIGH (ref 0.3–1.2)
Total Protein: 7.2 g/dL (ref 6.0–8.3)

## 2012-07-03 LAB — CBC
HCT: 23.4 % — ABNORMAL LOW (ref 39.0–52.0)
Hemoglobin: 8.4 g/dL — ABNORMAL LOW (ref 13.0–17.0)
MCH: 27.9 pg (ref 26.0–34.0)
MCHC: 35.9 g/dL (ref 30.0–36.0)
MCV: 77.7 fL — ABNORMAL LOW (ref 78.0–100.0)
Platelets: 115 10*3/uL — ABNORMAL LOW (ref 150–400)
RBC: 3.01 MIL/uL — ABNORMAL LOW (ref 4.22–5.81)
RDW: 15.9 % — ABNORMAL HIGH (ref 11.5–15.5)
WBC: 9.8 10*3/uL (ref 4.0–10.5)

## 2012-07-03 LAB — MAGNESIUM: Magnesium: 2.1 mg/dL (ref 1.5–2.5)

## 2012-07-03 MED ORDER — OXYCODONE HCL 5 MG PO TABS: 5.0000 mg | ORAL_TABLET | Freq: Four times a day (QID) | ORAL | Status: DC | PRN

## 2012-07-03 MED ORDER — SENNOSIDES-DOCUSATE SODIUM 8.6-50 MG PO TABS: 1.0000 | ORAL_TABLET | Freq: Two times a day (BID) | ORAL | Status: DC | PRN

## 2012-07-03 NOTE — Discharge Summary (Signed)
[facility] Discharge Summary  Patient ID: William Jennings MRN: [id number] DOB/AGE: 1990-10-27 22 y.o.  Admit date: 06/28/2012 Discharge date: 07/03/2012  Admission Diagnoses: Sickle Cell Crisis Sickle Cell Anemia Chest Wall Pain  Discharge Diagnoses:  Active Problems:  Sickle Cell Crisis  Sickle Cell Anemia  Chest Wall Pain  Fever, Leukocytosis, resolved  Abnormal LFT's   Discharged Condition: Stable  Clinic Course:  Patient is a 22 y.o. male with a PMHx of sickle cell disease, was discharged from [facility] on Saturday after treatment for crisis, [facility] early this am with chief complaints of fevers, headaches, back pain, hand pain and leg pain since Saturday night, he reports temp of 101 on Saturday and Sunday, back and leg pain associated with this and this am started experiencing chest pain which he describes as constant, sharp non radiating and non pleuritic.  The patient said that the symptoms are consistent with when he has sickle cell crisis   Sickle Cell Crisis: admitted with an elevate Retic and T. Bili 2.4. He was given gentle IV hydration, prn anti-emetic, anti-pruretic and bowel management regimen. He was also place on Lovenox for DVT prophylaxis. Hemoglobin electrophoresis on 06/23/12 prior to admission revealed a low fetal Hb (1.3%). He voiced an intolerance to Hydrea in the past and admitted noncompliance. He agreed to try it again and was started on Hydrea 500 mg daily. According to the patient, he had been of Hydrea since 2003 and reportedly may have 2-3 crisis/year with his last hospitalization prior to this admission in 2012 when he had pneumonia. Noting his elevated ALT (135 on 07/03/12) following the addition of Hydrea to his medication regimen on 06/30/12, we have decided to hold this medication as noted below. The need to resume this medication can be re-evaluated in the outpatient setting following repeat labs pending.    Acute  on Chronic Pain secondary to SCD-his pain was initially difficult to control. He was initially given boluses of IV Dilaudid prn and placed on IV Toradol. As his pain improved, he was placed on his home dose prn Percocet. His pain continued to improve, but the percocet was been ineffective in controlling his pain. He was then given 5 mg of Oxycodone in addition to the Percocet. He pain continued to improve, and he was weaned off PRN IV dilaudid. His LFT's then became mildly elevated (ALT 54) and he was placed on Oxy IR 10 mg every 6 hours scheduled and the Percocet discontinued. Prior to this admission, he was only taking Motrin prn pain at home. He has been given a Rx for Oxy IR 10 mg po every 6 hrs prn pain (#30, 0R) and the need to continue once this Rx runs out can be addressed with Dr August Saucer at his follow up office visit within 2 weeks.    Sickle cell anemia: Hb 11.8 on admission. Following fluid resuscitation, this trended down to 8.8 and was also felt secondary to evidence of active hemolysis. His Hgb remained stable and was noted to be 8.4 at the time of discharge.    Chest wall Pain: cardiac workup negative and felt associated with VOC. Improved with pain management and was absent on discharge.  Fever/Leukocytosis: WBC 17.7 on admission with a T-Max 101.1 and CRP > 80 on 06/29/12. CXR no evidence of active pulmonary disease. All monitored closely and felt secondary to inflammation with VOC.  He remained afebrile and was noted to have a repeat CRP 69.2 and  WBC 9.8 prior to discharge.   Abnormal LFT's- noted to be mildly increased with ALT 54 on 06/30/12. He was noted to have a level of 135 prior to discharge. His tylenol was discontinued in his pain medication and he was asymptomatic at the time of discharge. We have also chosen to hold his Hydrea and he has been instructed to continue avoiding all tylenol, acetaminophen products until repeat labs (CMET) obtained on 07/10/12 and he can be seen in follow  up.   Consults: None  Significant Diagnostic Studies:  Dg Chest 2 View   06/22/2012 *RADIOLOGY REPORT* Clinical Data: Sickle cell disease. Head and back pain. CHEST - 2 VIEW Comparison: Single view of the chest 05/11/2011. Findings: The lungs are clear. Heart size is normal. There is no pneumothorax or pleural effusion. 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 IMPRESSION: No acute abnormality. Original Report Authenticated By: Holley Dexter, M.D.   Dg Chest Portable 1 View   06/28/2012 *RADIOLOGY REPORT* Clinical Data: Chest pain. Shortness of breath. History of sickle cell. PORTABLE CHEST - 1 VIEW Comparison: 06/22/2012 Findings: Normal inspiration. The heart size and pulmonary vascularity are normal. The lungs appear clear and expanded without focal air space disease or consolidation. No blunting of the costophrenic angles. None changes in the spine consistent with history of sickle cell. No significant change since previous study. IMPRESSION: No evidence of active pulmonary disease. Original Report Authenticated By: Burman Nieves, M.D.   Treatments: None  Discharge Exam: Blood pressure 121/55, pulse 74, temperature 98.3 F (36.8 C), temperature source Oral, resp. rate 18, height 5\' 8"  (1.727 m), weight 62.3 kg (137 lb 5.6 oz), SpO2 100.00%.  General: Well-developed, well-nourished, AA male in no acute distress.  Head: Normocephalic, atraumatic.  Eyes: PERRL, EOMI, sclera nonicteric.  Nose: Mucous membranes moist, not inflammed, nonerythematous.  Throat: Oropharynx moist, pink, clear, no erythema or exudate   Neck: Supple, No carotid Bruits, no JVD.  Lungs: Normal respiratory effort. Clear to auscultation bilaterally; no rhonchi, rales or wheezes.  Heart: RRR. S1 and S2 normal without gallop, murmur, or rubs.  Abdomen: BS normoactive. Soft, Nondistended, non-tender. No masses or  organomegaly.  Extremities: No pretibial edema.  Neurologic: A&O X3, CN II - XII are grossly intact.  Skin: No visible rashes, scars Psych: pleasant, appropriate affect  Disposition: 01-Home or Self Care     Medication List     As of 07/03/2012  2:49 PM    TAKE these medications         folic acid 1 MG tablet   Commonly known as: FOLVITE   Take 1 tablet (1 mg total) by mouth daily.      ibuprofen 200 MG tablet   Commonly known as: ADVIL,MOTRIN   Take 600 mg by mouth every 6 (six) hours as needed. For pain      OVER THE COUNTER MEDICATION   Take 2 scoop by mouth daily. Mega men sport powder      oxyCODONE 5 MG immediate release tablet   Commonly known as: Oxy IR/ROXICODONE   Take 1 tablet (5 mg total) by mouth every 6 (six) hours as needed for pain.      senna-docusate 8.6-50 MG per tablet   Commonly known as: Senokot-S   Take 1 tablet by mouth 2 (two) times daily as needed for constipation.           Follow-up Information  Schedule an appointment as soon as possible for a visit with DEAN, ERIC, MD. (schedule appointment to follow up within 2 weeks of discharge)    Contact information:   [address] [place] [address] [phone]       Follow up with [facility]. On 07/10/2012. (labs needed: CMET)    Contact information:   [address] [place] [address] [phone]         Time Spent on Discharge: > 30 minutes  Signed: Samuel Germany, FNP-C [facility] Pager [phone] 07/03/2012, 2:49 PM  CC: Dr. Willey Blade, [facility]

## 2012-07-03 NOTE — Discharge Summary (Signed)
Patient examined with NP and plan of care discussed with NP agree with her D/C summary.

## 2012-07-05 LAB — CULTURE, BLOOD (ROUTINE X 2)
Culture: NO GROWTH
Culture: NO GROWTH

## 2012-08-10 ENCOUNTER — Emergency Department (HOSPITAL_BASED_OUTPATIENT_CLINIC_OR_DEPARTMENT_OTHER): Payer: Self-pay

## 2012-08-10 ENCOUNTER — Emergency Department (HOSPITAL_BASED_OUTPATIENT_CLINIC_OR_DEPARTMENT_OTHER)
Admission: EM | Admit: 2012-08-10 | Discharge: 2012-08-10 | Disposition: A | Discharge status: Home / Self Care | Payer: Self-pay | Attending: Emergency Medicine | Admitting: Emergency Medicine

## 2012-08-10 ENCOUNTER — Encounter (HOSPITAL_BASED_OUTPATIENT_CLINIC_OR_DEPARTMENT_OTHER): Payer: Self-pay | Admitting: *Deleted

## 2012-08-10 DIAGNOSIS — Z79899 Other long term (current) drug therapy: Secondary | ICD-10-CM | POA: Insufficient documentation

## 2012-08-10 DIAGNOSIS — L509 Urticaria, unspecified: Secondary | ICD-10-CM | POA: Insufficient documentation

## 2012-08-10 DIAGNOSIS — F17200 Nicotine dependence, unspecified, uncomplicated: Secondary | ICD-10-CM | POA: Insufficient documentation

## 2012-08-10 DIAGNOSIS — D5700 Hb-SS disease with crisis, unspecified: Secondary | ICD-10-CM | POA: Insufficient documentation

## 2012-08-10 DIAGNOSIS — N342 Other urethritis: Secondary | ICD-10-CM | POA: Insufficient documentation

## 2012-08-10 LAB — CBC WITH DIFFERENTIAL/PLATELET
Basophils Absolute: 0 10*3/uL (ref 0.0–0.1)
Basophils Relative: 0 % (ref 0–1)
Eosinophils Relative: 2 % (ref 0–5)
HCT: 32.3 % — ABNORMAL LOW (ref 39.0–52.0)
MCHC: 36.5 g/dL — ABNORMAL HIGH (ref 30.0–36.0)
MCV: 75.8 fL — ABNORMAL LOW (ref 78.0–100.0)
Monocytes Absolute: 0.5 10*3/uL (ref 0.1–1.0)
RDW: 16.4 % — ABNORMAL HIGH (ref 11.5–15.5)

## 2012-08-10 LAB — URINALYSIS, ROUTINE W REFLEX MICROSCOPIC
Bilirubin Urine: NEGATIVE
Glucose, UA: NEGATIVE mg/dL
Ketones, ur: NEGATIVE mg/dL
Protein, ur: NEGATIVE mg/dL

## 2012-08-10 LAB — URINE MICROSCOPIC-ADD ON

## 2012-08-10 LAB — BASIC METABOLIC PANEL
CO2: 26 mEq/L (ref 19–32)
Calcium: 9.5 mg/dL (ref 8.4–10.5)
Creatinine, Ser: 0.8 mg/dL (ref 0.50–1.35)
GFR calc Af Amer: >90 mL/min (ref >90)

## 2012-08-10 MED ORDER — MORPHINE SULFATE 4 MG/ML IJ SOLN
6.0000 mg | Freq: Once | INTRAMUSCULAR | Status: AC
  Administered 2012-08-10: 4 mg via INTRAVENOUS
  Filled 2012-08-10: qty 1

## 2012-08-10 MED ORDER — MORPHINE SULFATE 4 MG/ML IJ SOLN
4.0000 mg | Freq: Once | INTRAMUSCULAR | Status: AC
  Administered 2012-08-10: 4 mg via INTRAVENOUS
  Filled 2012-08-10: qty 1

## 2012-08-10 MED ORDER — SODIUM CHLORIDE 0.9 % IV BOLUS (SEPSIS)
1000.0000 mL | Freq: Once | INTRAVENOUS | Status: AC
  Administered 2012-08-10: 1000 mL via INTRAVENOUS

## 2012-08-10 MED ORDER — MORPHINE SULFATE 2 MG/ML IJ SOLN
INTRAMUSCULAR | Status: AC
  Administered 2012-08-10: 2 mg via INTRAMUSCULAR
  Filled 2012-08-10: qty 1

## 2012-08-10 MED ORDER — AZITHROMYCIN 250 MG PO TABS
1000.0000 mg | ORAL_TABLET | Freq: Once | ORAL | Status: AC
  Administered 2012-08-10: 1000 mg via ORAL
  Filled 2012-08-10: qty 4

## 2012-08-10 MED ORDER — SULFAMETHOXAZOLE-TRIMETHOPRIM 800-160 MG PO TABS: 1.0000 | ORAL_TABLET | Freq: Two times a day (BID) | ORAL | Status: AC

## 2012-08-10 MED ORDER — CEFTRIAXONE SODIUM 250 MG IJ SOLR
250.0000 mg | Freq: Once | INTRAMUSCULAR | Status: AC
  Administered 2012-08-10: 250 mg via INTRAMUSCULAR
  Filled 2012-08-10: qty 250

## 2012-08-10 MED ORDER — DIPHENHYDRAMINE HCL 50 MG/ML IJ SOLN
25.0000 mg | Freq: Once | INTRAMUSCULAR | Status: AC
  Administered 2012-08-10: 25 mg via INTRAVENOUS
  Filled 2012-08-10: qty 1

## 2012-08-10 NOTE — ED Provider Notes (Signed)
History     CSN: [id number]  Arrival date & time 08/10/12  1214   First MD Initiated Contact with Patient 08/10/12 1310      Chief Complaint  Patient presents with  . Rash    (Consider location/radiation/quality/duration/timing/severity/associated sxs/prior treatment) HPI Comments: Pt states that he is here for 2 seperated complaints:pt states that he is here because of generalized sickle pain:pt states that he has been having the symptoms over a month and he was hospitalized 2 times last month and he hasn't really gotten better since:pt states that he has not been seen by Dr. August Saucer during that time:pt denies fever, cough,sob,n/v/d:pt states that he has had intermittent swelling in his feet:pt states that he is also here for itching and a rash;pt states that he has had itching without a rash for the last 4 days and then this morning he broke out in a rash on his chest and back and he is really itchy:pt states that he has been taking otc allergy medication for the last couple of days, but it wasn't benadryl  The history is provided by the patient. No language interpreter was used.    Past Medical History  Diagnosis Date  . Sickle cell disease     Past Surgical History  Procedure Laterality Date  . Cholecystectomy      No family history on file.  History  Substance Use Topics  . Smoking status: Current Every Day Smoker  . Smokeless tobacco: Never Used  . Alcohol Use: No      Review of Systems  Constitutional: Negative.   Respiratory: Negative.   Cardiovascular: Negative.     Allergies  Shellfish allergy  Home Medications   Current Outpatient Rx  Name  Route  Sig  Dispense  Refill  . folic acid (FOLVITE) 1 MG tablet   Oral   Take 1 tablet (1 mg total) by mouth daily.   30 tablet   0   . ibuprofen (ADVIL,MOTRIN) 200 MG tablet   Oral   Take 600 mg by mouth every 6 (six) hours as needed. For pain         . OVER THE COUNTER MEDICATION   Oral   Take 2 scoop  by mouth daily. Mega men sport powder         . oxyCODONE (OXY IR/ROXICODONE) 5 MG immediate release tablet   Oral   Take 1 tablet (5 mg total) by mouth every 6 (six) hours as needed for pain.   30 tablet   0   . senna-docusate (SENOKOT-S) 8.6-50 MG per tablet   Oral   Take 1 tablet by mouth 2 (two) times daily as needed for constipation.   60 tablet   0     BP 111/65  Pulse 84  Temp(Src) 98.4 F (36.9 C) (Oral)  Resp 20  Ht 5\' 8"  (1.727 m)  Wt 135 lb (61.236 kg)  BMI 20.53 kg/m2  SpO2 100%  Physical Exam  Nursing note and vitals reviewed. Constitutional: He is oriented to person, place, and time. He appears well-developed and well-nourished.  HENT:  Head: Normocephalic and atraumatic.  Eyes: Conjunctivae and EOM are normal. Pupils are equal, round, and reactive to light.  Neck: Normal range of motion. Neck supple.  Cardiovascular: Normal rate and regular rhythm.   Pulmonary/Chest: Effort normal and breath sounds normal.  Abdominal: Soft. Bowel sounds are normal.  Musculoskeletal: Normal range of motion.  Neurological: He is alert and oriented to person, place, and time.  Skin:  Pt has hives noted to chest and back  Psychiatric: He has a normal mood and affect.    ED Course  Procedures (including critical care time)  Labs Reviewed  CBC WITH DIFFERENTIAL - Abnormal; Notable for the following:    Hemoglobin 11.8 (*)    HCT 32.3 (*)    MCV 75.8 (*)    MCHC 36.5 (*)    RDW 16.4 (*)    All other components within normal limits  URINALYSIS, ROUTINE W REFLEX MICROSCOPIC - Abnormal; Notable for the following:    Color, Urine AMBER (*)    Leukocytes, UA MODERATE (*)    All other components within normal limits  RETICULOCYTES - Abnormal; Notable for the following:    Retic Ct Pct 3.3 (*)    All other components within normal limits  URINE MICROSCOPIC-ADD ON - Abnormal; Notable for the following:    Bacteria, UA FEW (*)    All other components within normal  limits  URINE CULTURE  GC/CHLAMYDIA PROBE AMP  BASIC METABOLIC PANEL   Dg Chest 2 View  08/10/2012  *RADIOLOGY REPORT*  Clinical Data: Sickle cell pain.  Joint pain.  CHEST - 2 VIEW  Comparison: 06/28/2012  Findings: Thoracic verbal endplate changes of sickle cell disease.  Midline trachea.  Mild convex right thoracic spine curvature. Normal heart size and mediastinal contours. No pleural effusion or pneumothorax.  Clear lungs.  IMPRESSION: No acute cardiopulmonary disease.  Osseous findings of sickle cell disease.   Original Report Authenticated By: Jeronimo Greaves, M.D.      1. Sickle cell pain crisis   2. Urethritis       MDM  Pt treated for std:pt is feeling better at this time and doesn't feel like he needs to be admitted for pain control        Teressa Lower, NP 08/10/12 1718

## 2012-08-10 NOTE — ED Notes (Signed)
Woke with a rash on his chest and back. States he is also here for joint pain from sickle cell x 3 weeks. No relief with Ibuprofen.

## 2012-08-10 NOTE — ED Notes (Signed)
Patient transported to X-ray 

## 2012-08-11 LAB — URINE CULTURE

## 2012-08-11 LAB — GC/CHLAMYDIA PROBE AMP
CT Probe RNA: NEGATIVE
GC Probe RNA: NEGATIVE

## 2012-08-11 NOTE — ED Provider Notes (Signed)
Medical screening examination/treatment/procedure(s) were performed by non-physician practitioner and as supervising physician I was immediately available for consultation/collaboration.   Gwyneth Sprout, MD 08/11/12 [phone]

## 2012-08-13 ENCOUNTER — Encounter (HOSPITAL_COMMUNITY): Payer: Self-pay | Admitting: Emergency Medicine

## 2012-08-13 ENCOUNTER — Inpatient Hospital Stay (HOSPITAL_COMMUNITY): Payer: Medicaid Other

## 2012-08-13 ENCOUNTER — Inpatient Hospital Stay (HOSPITAL_COMMUNITY)
Admission: EM | Admit: 2012-08-13 | Discharge: 2012-08-23 | DRG: 812 | Disposition: A | Discharge status: Home / Self Care | Payer: Medicaid Other | Attending: Internal Medicine | Admitting: Internal Medicine

## 2012-08-13 ENCOUNTER — Emergency Department (HOSPITAL_COMMUNITY): Payer: Medicaid Other

## 2012-08-13 DIAGNOSIS — D5700 Hb-SS disease with crisis, unspecified: Principal | ICD-10-CM

## 2012-08-13 DIAGNOSIS — R21 Rash and other nonspecific skin eruption: Secondary | ICD-10-CM

## 2012-08-13 DIAGNOSIS — D72829 Elevated white blood cell count, unspecified: Secondary | ICD-10-CM

## 2012-08-13 DIAGNOSIS — Z87891 Personal history of nicotine dependence: Secondary | ICD-10-CM

## 2012-08-13 DIAGNOSIS — M62838 Other muscle spasm: Secondary | ICD-10-CM | POA: Diagnosis present

## 2012-08-13 DIAGNOSIS — R209 Unspecified disturbances of skin sensation: Secondary | ICD-10-CM | POA: Diagnosis present

## 2012-08-13 DIAGNOSIS — D571 Sickle-cell disease without crisis: Secondary | ICD-10-CM

## 2012-08-13 DIAGNOSIS — D696 Thrombocytopenia, unspecified: Secondary | ICD-10-CM

## 2012-08-13 DIAGNOSIS — Z79899 Other long term (current) drug therapy: Secondary | ICD-10-CM

## 2012-08-13 DIAGNOSIS — R000 Tachycardia, unspecified: Secondary | ICD-10-CM | POA: Diagnosis present

## 2012-08-13 DIAGNOSIS — M436 Torticollis: Secondary | ICD-10-CM | POA: Diagnosis present

## 2012-08-13 LAB — CBC WITH DIFFERENTIAL/PLATELET
Basophils Relative: 1 % (ref 0–1)
HCT: 32.2 % — ABNORMAL LOW (ref 39.0–52.0)
Lymphocytes Relative: 35 % (ref 12–46)
MCH: 27.4 pg (ref 26.0–34.0)
MCHC: 36.6 g/dL — ABNORMAL HIGH (ref 30.0–36.0)
MCV: 74.9 fL — ABNORMAL LOW (ref 78.0–100.0)
Neutrophils Relative %: 51 % (ref 43–77)
Platelets: 160 10*3/uL (ref 150–400)
RDW: 17.5 % — ABNORMAL HIGH (ref 11.5–15.5)
WBC: 7.6 10*3/uL (ref 4.0–10.5)

## 2012-08-13 LAB — RETICULOCYTES
RBC.: 4.3 MIL/uL (ref 4.22–5.81)
Retic Ct Pct: 4 % — ABNORMAL HIGH (ref 0.4–3.1)

## 2012-08-13 LAB — URINALYSIS, ROUTINE W REFLEX MICROSCOPIC
Glucose, UA: NEGATIVE mg/dL
Hgb urine dipstick: NEGATIVE
Leukocytes, UA: NEGATIVE
Protein, ur: NEGATIVE mg/dL
Specific Gravity, Urine: 1.011 (ref 1.005–1.030)
Urobilinogen, UA: 1 mg/dL (ref 0.0–1.0)

## 2012-08-13 LAB — COMPREHENSIVE METABOLIC PANEL
AST: 43 U/L — ABNORMAL HIGH (ref 0–37)
Albumin: 3.8 g/dL (ref 3.5–5.2)
Alkaline Phosphatase: 136 U/L — ABNORMAL HIGH (ref 39–117)
Chloride: 101 mEq/L (ref 96–112)
Potassium: 4.2 mEq/L (ref 3.5–5.1)
Total Bilirubin: 1.3 mg/dL — ABNORMAL HIGH (ref 0.3–1.2)
Total Protein: 8.9 g/dL — ABNORMAL HIGH (ref 6.0–8.3)

## 2012-08-13 MED ORDER — FOLIC ACID 1 MG PO TABS
1.0000 mg | ORAL_TABLET | Freq: Every day | ORAL | Status: DC
  Administered 2012-08-13 – 2012-08-23 (×11): 1 mg via ORAL
  Filled 2012-08-13 (×11): qty 1

## 2012-08-13 MED ORDER — HYDROXYZINE HCL 25 MG PO TABS
25.0000 mg | ORAL_TABLET | Freq: Three times a day (TID) | ORAL | Status: DC | PRN
  Administered 2012-08-13 – 2012-08-14 (×2): 25 mg via ORAL
  Filled 2012-08-13 (×2): qty 1

## 2012-08-13 MED ORDER — DIPHENHYDRAMINE HCL 50 MG/ML IJ SOLN
25.0000 mg | Freq: Four times a day (QID) | INTRAMUSCULAR | Status: DC | PRN
  Administered 2012-08-13: 25 mg via INTRAVENOUS
  Filled 2012-08-13: qty 1

## 2012-08-13 MED ORDER — KETOROLAC TROMETHAMINE 30 MG/ML IJ SOLN
30.0000 mg | Freq: Four times a day (QID) | INTRAMUSCULAR | Status: AC
  Administered 2012-08-13 – 2012-08-18 (×19): 30 mg via INTRAVENOUS
  Filled 2012-08-13 (×27): qty 1

## 2012-08-13 MED ORDER — IBUPROFEN 800 MG PO TABS: 800.0000 mg | ORAL_TABLET | Freq: Three times a day (TID) | ORAL | Status: DC | PRN

## 2012-08-13 MED ORDER — HYDROMORPHONE HCL PF 2 MG/ML IJ SOLN
2.0000 mg | INTRAMUSCULAR | Status: DC
  Administered 2012-08-13 – 2012-08-14 (×7): 2 mg via INTRAVENOUS
  Filled 2012-08-13 (×8): qty 1

## 2012-08-13 MED ORDER — MORPHINE SULFATE 4 MG/ML IJ SOLN
6.0000 mg | Freq: Once | INTRAMUSCULAR | Status: AC
  Administered 2012-08-13: 6 mg via INTRAVENOUS
  Filled 2012-08-13 (×2): qty 1

## 2012-08-13 MED ORDER — OXYCODONE HCL 5 MG PO TABS
5.0000 mg | ORAL_TABLET | Freq: Four times a day (QID) | ORAL | Status: DC | PRN
Start: 2012-08-13 — End: 2012-08-16
  Administered 2012-08-13: 5 mg via ORAL
  Filled 2012-08-13 (×2): qty 1

## 2012-08-13 MED ORDER — SODIUM CHLORIDE 0.9 % IV SOLN
INTRAVENOUS | Status: DC
  Administered 2012-08-14: 02:00:00 via INTRAVENOUS

## 2012-08-13 MED ORDER — HYDROMORPHONE HCL PF 1 MG/ML IJ SOLN
1.0000 mg | INTRAMUSCULAR | Status: AC | PRN
  Administered 2012-08-13: 1 mg via INTRAVENOUS
  Filled 2012-08-13: qty 1

## 2012-08-13 MED ORDER — ONDANSETRON HCL 4 MG/2ML IJ SOLN
4.0000 mg | Freq: Once | INTRAMUSCULAR | Status: AC
  Administered 2012-08-13: 4 mg via INTRAVENOUS
  Filled 2012-08-13: qty 2

## 2012-08-13 MED ORDER — SODIUM CHLORIDE 0.9 % IV SOLN
INTRAVENOUS | Status: AC
  Administered 2012-08-13: 1000 mL via INTRAVENOUS

## 2012-08-13 MED ORDER — ENOXAPARIN SODIUM 40 MG/0.4ML ~~LOC~~ SOLN
40.0000 mg | SUBCUTANEOUS | Status: DC
  Administered 2012-08-13 – 2012-08-22 (×10): 40 mg via SUBCUTANEOUS
  Filled 2012-08-13 (×12): qty 0.4

## 2012-08-13 MED ORDER — MORPHINE SULFATE 4 MG/ML IJ SOLN
6.0000 mg | Freq: Once | INTRAMUSCULAR | Status: AC
  Administered 2012-08-13: 6 mg via INTRAVENOUS
  Filled 2012-08-13: qty 2

## 2012-08-13 MED ORDER — SODIUM CHLORIDE 0.9 % IV BOLUS (SEPSIS)
1000.0000 mL | Freq: Once | INTRAVENOUS | Status: AC
  Administered 2012-08-13: 1000 mL via INTRAVENOUS

## 2012-08-13 MED ORDER — HYDROMORPHONE HCL PF 1 MG/ML IJ SOLN
1.0000 mg | Freq: Once | INTRAMUSCULAR | Status: AC
  Administered 2012-08-13: 1 mg via INTRAVENOUS
  Filled 2012-08-13: qty 1

## 2012-08-13 MED ORDER — DIPHENHYDRAMINE HCL 25 MG PO CAPS
25.0000 mg | ORAL_CAPSULE | Freq: Once | ORAL | Status: AC
  Administered 2012-08-13: 25 mg via ORAL
  Filled 2012-08-13: qty 1

## 2012-08-13 NOTE — ED Notes (Signed)
Pt has raised rash and hives on chest and back. Pt has had rash for 4 days. Pt taking benadryl at home and it is not effective

## 2012-08-13 NOTE — H&P (Signed)
[facility] History and Physical  William Jennings WGN:[id number] DOB: [birth year] DOA: 08/13/2012  Referring physician: ED PCP: Willey Blade, MD   Chief Complaint:  Lower back and bilateral leg pains worsened for past one to 2 days  HPI:  22 year old African American male with history of sickle cell disease who presented to the ED with symptoms of worsened bilateral knee and leg pains and back pain for past 2 days. He should was admitted to the hospital one month back with sickle cell crisis and discharged home. He informs that since then he hasn't fully recovered with the symptoms and has bilateral leg pains especially in his knees and appear to be swollen by the end of the day. He has difficulty ambulating and even getting on his feet because of the pain several times during the day. He denies any chest pain, palpitations, shortness of breath, headache, fever, chills, dizziness, abdominal pain, nausea, vomiting, denies bowel or urinary symptoms. Denies any trauma. He informs taking Vicodin and ibuprofen at home which has not relieved his symptoms. His symptoms have got worse over past 2 days and is similar to sickle cell crisis. In addition to this he has started developing a papular rash over his chest abdomen and the back since past 4 days. Denies eating anything outside or using new clothes or Linen. Denies any new medications. Denies having any pets at home. In the ED patient was given 6 mg of morphine IV x2 followed by 1 mg on IV Dilaudid which slightly improved his symptoms. He was also given a dose of IV Benadryl for his itching. Patient informs taking Benadryl at home for his itching which really has not improved his symptoms. Absent unremarkable. Chest x-ray was normal as well. [facility] called to admit patient for sickle cell crisis.  Review of Systems:  Constitutional: Denies fever, chills, diaphoresis, appetite change and fatigue.  HEENT: Denies photophobia, eye pain,  redness, hearing loss, ear pain, congestion, sore throat, rhinorrhea, sneezing, mouth sores, trouble swallowing, neck pain, neck stiffness and tinnitus.   Respiratory: Denies SOB, DOE, cough, chest tightness,  and wheezing.   Cardiovascular: Denies chest pain, palpitations and leg swelling.  Gastrointestinal: Denies nausea, vomiting, abdominal pain, diarrhea, constipation, blood in stool and abdominal distention.  Genitourinary: Denies dysuria, urgency, frequency, hematuria, flank pain and difficulty urinating.  Musculoskeletal: Pain and swelling over the knees, ankle and lower back. Complains of difficulty ambulating because of pain.  Skin: Papular rash over the trunk and the back diffusely since last 4 days  Neurological: Denies dizziness, seizures, syncope, weakness, light-headedness, numbness and headaches.  Hematological: Denies adenopathy. Easy bruising, personal or family bleeding history  Psychiatric/Behavioral: Denies suicidal ideation, mood changes, confusion, nervousness, sleep disturbance and agitation   Past Medical History  Diagnosis Date  . Sickle cell disease    Past Surgical History  Procedure Laterality Date  . Cholecystectomy     Social History:  reports that he has quit smoking. He has never used smokeless tobacco. He reports that he does not drink alcohol or use illicit drugs.  Allergies  Allergen Reactions  . Shellfish Allergy Anaphylaxis    No family history on file.  Prior to Admission medications   Medication Sig Start Date End Date Taking? Authorizing Provider  folic acid (FOLVITE) 1 MG tablet Take 1 tablet (1 mg total) by mouth daily. 06/23/12  Yes Altha Harm, MD  ibuprofen (ADVIL,MOTRIN) 800 MG tablet Take 800 mg by mouth every 8 (eight) hours as needed for  pain.   Yes Historical Provider, MD  oxyCODONE (OXY IR/ROXICODONE) 5 MG immediate release tablet Take 1 tablet (5 mg total) by mouth every 6 (six) hours as needed for pain. 07/03/12  Yes Lizbeth Bark, FNP  sulfamethoxazole-trimethoprim (BACTRIM DS,SEPTRA DS) 800-160 MG per tablet Take 1 tablet by mouth 2 (two) times daily. 08/10/12 08/17/12  Teressa Lower, NP    Physical Exam:  Filed Vitals:   08/13/12 0942 08/13/12 1235 08/13/12 1416 08/13/12 1416  BP: 139/75 123/75 109/67 118/66  Pulse: 89 75  85  Temp: 98.8 F (37.1 C)     TempSrc: Oral     Resp: 16 16    SpO2: 99% 100%  99%    Constitutional: Vital signs reviewed.  Patient is a well-developed and well-nourished in no acute distress and cooperative with exam. Alert and oriented x3.  Head: Normocephalic and atraumatic Ear: TM normal bilaterally Mouth: no erythema or exudates, MMM Eyes: PERRL, EOMI, conjunctivae normal, No scleral icterus.  Neck: Supple, Trachea midline normal ROM, No JVD, mass, thyromegaly, or carotid bruit present.  Cardiovascular: RRR, S1 normal, S2 normal, no MRG, pulses symmetric and intact bilaterally Pulmonary/Chest: CTAB, no wheezes, rales, or rhonchi Abdominal: Soft. Non-tender, non-distended, bowel sounds are normal, no masses, organomegaly, or guarding present.  GU: no CVA tenderness Musculoskeletal: Pain and tenderness to palpate over bilateral knees left greater than right with some limited flexion . No swelling. No joint deformities, erythema, or stiffness, ROM full and no nontender Ext: no edema and no cyanosis, pulses palpable bilaterally (DP and PT) papular rash over the trunk and back diffusely,  Hematology: no cervical, inginal, or axillary adenopathy.  Neurological: A&O x3, Strenght is normal and symmetric bilaterally, cranial nerve II-XII are grossly intact, no focal motor deficit, sensory intact to light touch bilaterally.  Skin: Warm, dry and intact. No rash, cyanosis, or clubbing.  Psychiatric: Normal mood and affect. speech and behavior is normal. Judgment and thought content normal. Cognition and memory are normal.   Labs on Admission:  Basic Metabolic Panel:  Recent Labs Lab  08/10/12 1400 08/13/12 1004  NA 137 135  K 4.0 4.2  CL 104 101  CO2 26 27  GLUCOSE 94 96  BUN 7 8  CREATININE 0.80 0.86  CALCIUM 9.5 9.3   Liver Function Tests:  Recent Labs Lab 08/13/12 1004  AST 43*  ALT 32  ALKPHOS 136*  BILITOT 1.3*  PROT 8.9*  ALBUMIN 3.8   No results found for this basename: LIPASE, AMYLASE,  in the last 168 hours No results found for this basename: AMMONIA,  in the last 168 hours CBC:  Recent Labs Lab 08/10/12 1400 08/13/12 1004  WBC 5.4 7.6  NEUTROABS 3.4 3.8  HGB 11.8* 11.8*  HCT 32.3* 32.2*  MCV 75.8* 74.9*  PLT 154 160   Cardiac Enzymes: No results found for this basename: CKTOTAL, CKMB, CKMBINDEX, TROPONINI,  in the last 168 hours BNP: No components found with this basename: POCBNP,  CBG: No results found for this basename: GLUCAP,  in the last 168 hours  Radiological Exams on Admission: Dg Chest 2 View  08/13/2012  *RADIOLOGY REPORT*  Clinical Data: Sickle cell pain, crisis.  CHEST - 2 VIEW  Comparison: 08/10/2012  Findings: Heart is normal size.  Lungs are clear.  No effusions or acute bony abnormality.  IMPRESSION: No acute cardiopulmonary disease.   Original Report Authenticated By: Charlett Nose, M.D.       Assessment/Plan Active Problems:   Sickle cell  crisis Patient informs his symptoms to be ongoing for past few weeks and has worsened over past 2 days. Admit to medical floor. Continue with IV hydration and scheduled dilaudid 2 mg every 2 hours. Will also place him on IV Toradol 30 mg every 6 hours as needed for pain. Continue his home dose of ibuprofen and hydrocodone for now. -Patient complaining of left knee pain for several weeks now that is limiting his movements. I will check an x-ray of his left knee.    Rash and other nonspecific skin eruption Patient has a papular rash over the trunk and the back. No specific trigger to cause the symptoms. Patient is not on any new medications or gives history of having any food  allergies. Also denies any new linens or pets in the house. I will prescribe him with IV Benadryl and hydroxyzine and monitor.  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

## 2012-08-13 NOTE — ED Notes (Addendum)
Pt aware of the need for a urine sample, urinal at bedside. 

## 2012-08-13 NOTE — ED Notes (Signed)
Pt presents w/ sickle pain ongoing for 1 month, states sickle cell crisis actually started this morning. Pt is out of pain meds x 1 month.

## 2012-08-13 NOTE — ED Provider Notes (Signed)
History     CSN: [id number]  Arrival date & time 08/13/12  4098   First MD Initiated Contact with Patient 08/13/12 1005      Chief Complaint  Patient presents with  . Sickle Cell Pain Crisis    (Consider location/radiation/quality/duration/timing/severity/associated sxs/prior treatment) HPI Comments: Pt states that he has had intermittent pain over the last couple month:pt states that he is hurting in his left leg and back:pt denies fever, cough,n/v/d  Patient is a 22 y.o. male presenting with sickle cell pain. The history is provided by the patient. No language interpreter was used.  Sickle Cell Pain Crisis  This is a recurrent problem. The current episode started more than 2 weeks ago. The onset was gradual. The problem occurs continuously. The problem has been unchanged. Pain location: back and left lower leg. The pain is similar to prior episodes. Nothing relieves the symptoms. Associated symptoms include back pain. Pertinent negatives include no abdominal pain and no cough. There is a history of acute chest syndrome. There have been frequent pain crises. There is no history of stroke. There were no sick contacts.    Past Medical History  Diagnosis Date  . Sickle cell disease     Past Surgical History  Procedure Laterality Date  . Cholecystectomy      No family history on file.  History  Substance Use Topics  . Smoking status: Former Games developer  . Smokeless tobacco: Never Used  . Alcohol Use: No      Review of Systems  Constitutional: Negative for fever.  Respiratory: Negative for cough.   Cardiovascular: Negative.   Gastrointestinal: Negative for abdominal pain.  Musculoskeletal: Positive for back pain.    Allergies  Shellfish allergy  Home Medications   Current Outpatient Rx  Name  Route  Sig  Dispense  Refill  . folic acid (FOLVITE) 1 MG tablet   Oral   Take 1 tablet (1 mg total) by mouth daily.   30 tablet   0   . ibuprofen (ADVIL,MOTRIN) 800 MG  tablet   Oral   Take 800 mg by mouth every 8 (eight) hours as needed for pain.         Marland Kitchen oxyCODONE (OXY IR/ROXICODONE) 5 MG immediate release tablet   Oral   Take 1 tablet (5 mg total) by mouth every 6 (six) hours as needed for pain.   30 tablet   0   . sulfamethoxazole-trimethoprim (BACTRIM DS,SEPTRA DS) 800-160 MG per tablet   Oral   Take 1 tablet by mouth 2 (two) times daily.   14 tablet   0     BP 139/75  Pulse 89  Temp(Src) 98.8 F (37.1 C) (Oral)  Resp 16  SpO2 99%  Physical Exam  Nursing note and vitals reviewed. Constitutional: He is oriented to person, place, and time. He appears well-developed and well-nourished.  HENT:  Head: Normocephalic and atraumatic.  Eyes: Conjunctivae and EOM are normal.  Neck: Neck supple.  Cardiovascular: Normal rate and regular rhythm.   Pulmonary/Chest: Effort normal and breath sounds normal.  Abdominal: Soft. Bowel sounds are normal. There is no tenderness.  Musculoskeletal: Normal range of motion. He exhibits no edema and no tenderness.  Neurological: He is alert and oriented to person, place, and time. Coordination normal.  Skin:  Hives noted to trunk  Psychiatric: He has a normal mood and affect.    ED Course  Procedures (including critical care time)  Labs Reviewed  CBC WITH DIFFERENTIAL - Abnormal; Notable  for the following:    Hemoglobin 11.8 (*)    HCT 32.2 (*)    MCV 74.9 (*)    MCHC 36.6 (*)    RDW 17.5 (*)    All other components within normal limits  COMPREHENSIVE METABOLIC PANEL - Abnormal; Notable for the following:    Total Protein 8.9 (*)    AST 43 (*)    Alkaline Phosphatase 136 (*)    Total Bilirubin 1.3 (*)    All other components within normal limits  RETICULOCYTES - Abnormal; Notable for the following:    Retic Ct Pct 4.0 (*)    All other components within normal limits  URINALYSIS, ROUTINE W REFLEX MICROSCOPIC   Dg Chest 2 View  08/13/2012  *RADIOLOGY REPORT*  Clinical Data: Sickle cell  pain, crisis.  CHEST - 2 VIEW  Comparison: 08/10/2012  Findings: Heart is normal size.  Lungs are clear.  No effusions or acute bony abnormality.  IMPRESSION: No acute cardiopulmonary disease.   Original Report Authenticated By: Charlett Nose, M.D.      1. Sickle cell crisis   2. Rash and other nonspecific skin eruption       MDM  Pt to be addmitted for pain control        Teressa Lower, NP 08/15/12 1153

## 2012-08-13 NOTE — ED Notes (Signed)
Pt unable to provide a urine sample at this time, urinal at bedside.

## 2012-08-13 NOTE — Progress Notes (Signed)
Pt arrived on the unit with his wife and 2 small children. Pleasant young man. C/O pain in his legs and lower back, rates it a 6-7. States the pain medicine dilaudid 2mg  and touradal 30 mg not helping his pain. Night nurse informed during shift change. LBM was 3/2. Appetite good.

## 2012-08-14 ENCOUNTER — Inpatient Hospital Stay (HOSPITAL_COMMUNITY): Payer: Medicaid Other

## 2012-08-14 ENCOUNTER — Encounter (HOSPITAL_COMMUNITY): Payer: Self-pay | Admitting: *Deleted

## 2012-08-14 LAB — APTT: aPTT: 34 seconds (ref 24–37)

## 2012-08-14 MED ORDER — HYDROMORPHONE 0.3 MG/ML IV SOLN
INTRAVENOUS | Status: DC
Start: 2012-08-14 — End: 2012-08-16
  Administered 2012-08-14: 5.49 mg via INTRAVENOUS
  Administered 2012-08-14: 12:00:00 via INTRAVENOUS
  Administered 2012-08-14: 3.49 mg via INTRAVENOUS
  Administered 2012-08-14: 0.5 mg via INTRAVENOUS
  Administered 2012-08-14: 21:00:00 via INTRAVENOUS
  Administered 2012-08-15: 3.99 mg via INTRAVENOUS
  Administered 2012-08-15: 6.73 mg via INTRAVENOUS
  Administered 2012-08-15: 2.99 mg via INTRAVENOUS
  Administered 2012-08-15: 17:00:00 via INTRAVENOUS
  Administered 2012-08-15: 1 mg via INTRAVENOUS
  Administered 2012-08-15: 3.99 mg via INTRAVENOUS
  Administered 2012-08-15: 2.49 mg via INTRAVENOUS
  Administered 2012-08-16 (×2): via INTRAVENOUS
  Administered 2012-08-16: 5.15 mg via INTRAVENOUS
  Administered 2012-08-16: 3.64 mg via INTRAVENOUS
  Administered 2012-08-16: 3.99 mg via INTRAVENOUS
  Filled 2012-08-14 (×7): qty 25

## 2012-08-14 MED ORDER — SODIUM CHLORIDE 0.9 % IV SOLN
INTRAVENOUS | Status: DC
  Administered 2012-08-14: 08:00:00 via INTRAVENOUS

## 2012-08-14 MED ORDER — NALOXONE HCL 0.4 MG/ML IJ SOLN: 0.4000 mg | INTRAMUSCULAR | Status: DC | PRN

## 2012-08-14 MED ORDER — DIPHENHYDRAMINE HCL 50 MG/ML IJ SOLN
12.5000 mg | Freq: Four times a day (QID) | INTRAMUSCULAR | Status: DC | PRN
  Administered 2012-08-17 – 2012-08-20 (×2): 12.5 mg via INTRAVENOUS
  Filled 2012-08-14 (×3): qty 1

## 2012-08-14 MED ORDER — SODIUM CHLORIDE 0.9 % IJ SOLN
9.0000 mL | INTRAMUSCULAR | Status: DC | PRN
  Administered 2012-08-16: 3 mL via INTRAVENOUS

## 2012-08-14 MED ORDER — SODIUM CHLORIDE 0.45 % IV SOLN
INTRAVENOUS | Status: DC
  Administered 2012-08-14 – 2012-08-19 (×4): via INTRAVENOUS
  Administered 2012-08-21: 20 mL/h via INTRAVENOUS
  Administered 2012-08-22 (×2): via INTRAVENOUS

## 2012-08-14 MED ORDER — ONDANSETRON HCL 4 MG/2ML IJ SOLN: 4.0000 mg | Freq: Four times a day (QID) | INTRAMUSCULAR | Status: DC | PRN

## 2012-08-14 NOTE — Progress Notes (Signed)
Early this morning patient woke up with numbness and? Weakness of his left hand. Vitals were stable. With concern for CVA given his history of sickle cell disease neurology on call consulted and over the phone who recommended to initiate code stroke. A head CT was done which was unremarkable. When the overnight NP  evaluated he did have some decreased hand grips without any sensory deficits. On my evaluation patient informs that he had numbness of his left fingers after waking up which lasted for almost an hour and this was associated with pain in his wrist and hand. Patient informs that this is not the first time he's had these symptoms and has had numbness of the fingers with pain during sickle cell events. He also has a peripheral IV over the left forearm and feels that he slept on his left arm. A 12-lead EKG was normal. On my evaluation his numbness had resolved however still had some pain in his wrist . Patient was planned for transfer to [facility] with concern for stroke however think his symptoms are very unlikely for a stroke although he does have significant risk given his sickle cell disease. A possible TIA at this time however cannot be ruled out I will transfer him to a telemetry bed. We'll order for an MRI brain.  -I have discussed with the new hospitalists Dr. Cyril Mourning that he does not need to be transferred to [facility] at this time and we can do the workup here and he agrees with the plan.

## 2012-08-14 NOTE — Progress Notes (Signed)
Summary -0545- 0800.  Patient c/o left hand numbness and tingling after sleeping on left arm and side with IV in left AC. No neurological deficits at initial assessment. T.Callahan NP notified and came to assess patient,ordered CT scan of brain and notified Neurologist . 0600 medications were held. Patient remained alert oriented. Rapid Response Nurse notified of Patient's status. Transported to Radiology in bed and tolerated CT scan. Returned to room. Rapid response nurse at bedside. EKG was ordered and done. IV team in to restart Iv.. Patient states "You all are doing to much, I am not having a stroke.Please ask the doctor to come see me I don't want to be transferred to [facility] hospital". Dr Gonzella Lex notified and came up to see patient and stated he would not have patient transferred to [facility] due to patient status and results of  Tests completed so far.

## 2012-08-14 NOTE — Progress Notes (Signed)
[facility] PROGRESS NOTE  William Jennings WGN:[id number] DOB: 01-26-1991 DOA: 08/13/2012 PCP: August Saucer ERIC, MD  Assessment/Plan: Active Problems:  Sickle cell crisis  Continue with IV hydration and scheduled dilaudid 2 mg every 2 hours. Continue IV Toradol 30 mg every 6 hours as needed for pain. Continue his home dose of ibuprofen and hydrocodone for now.    Rash and other nonspecific skin eruption  Patient has a papular rash over the trunk and the back. Continue him with IV Benadryl and hydroxyzine and monitor.   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  Pain is improving.  Left hand numbness Low probability for stroke. The patient's symptom is localized to the left hand mostly 4 fingers. CT of the head is negative. He was moved to telemetry. MRI of his head is pending. Case was discussed with neurology by Dr Theda Belfast Dhungel.    Code Status: Full Code Family Communication: None Disposition Plan: Home when medically stable   Consultants:  Neurology  Procedures:  None  Antibiotics:  None  HPI/Subjective: Patient complains of numbness in 4 fingers on the left hand. He has no chest pain no shortness of breath no problems with speech or swallowing.  Objective: Filed Vitals:   08/14/12 0035 08/14/12 0530 08/14/12 0940 08/14/12 1054  BP: 110/54 123/65 125/62 135/86  Pulse: 96 91 90 85  Temp: 98 F (36.7 C) 98.3 F (36.8 C) 98.3 F (36.8 C) 97.4 F (36.3 C)  TempSrc: Oral Oral Oral Oral  Resp: 16 16 19 18   Height:    5\' 8"  (1.727 m)  Weight:    63.504 kg (140 lb)  SpO2: 99% 98% 96% 100%    Intake/Output Summary (Last 24 hours) at 08/14/12 1141 Last data filed at 08/14/12 1100  Gross per 24 hour  Intake 2448.33 ml  Output   2101 ml  Net 347.33 ml   Filed Weights   08/13/12 1606 08/14/12 1054  Weight: 63.504 kg (140 lb) 63.504 kg (140 lb)    Exam:   General:  Patient laying in bed. He does not seem to be  in any acute distress.  Cardiovascular: Regular rate rhythm  Respiratory: Clear to auscultations bilaterally  Abdomen: Soft nontender nondistended positive bowel sounds  Extremities: No edema  Neuro: Nonfocal; numbness in 4 fingers of the left hand  Data Reviewed: Basic Metabolic Panel:  Recent Labs Lab 08/10/12 1400 08/13/12 1004  NA 137 135  K 4.0 4.2  CL 104 101  CO2 26 27  GLUCOSE 94 96  BUN 7 8  CREATININE 0.80 0.86  CALCIUM 9.5 9.3   Liver Function Tests:  Recent Labs Lab 08/13/12 1004  AST 43*  ALT 32  ALKPHOS 136*  BILITOT 1.3*  PROT 8.9*  ALBUMIN 3.8    Recent Labs Lab 08/10/12 1400 08/13/12 1004  WBC 5.4 7.6  NEUTROABS 3.4 3.8  HGB 11.8* 11.8*  HCT 32.3* 32.2*  MCV 75.8* 74.9*  PLT 154 160    CBG: No results found for this basename: GLUCAP,  in the last 168 hours  Recent Results (from the past 240 hour(s))  URINE CULTURE     Status: None   Collection Time    08/10/12  2:03 PM      Result Value Range Status   Specimen Description URINE, CLEAN CATCH   Final   Special Requests NONE   Final   Culture  Setup Time 08/10/2012 19:33   Final  Colony Count NO GROWTH   Final   Culture NO GROWTH   Final   Report Status 08/11/2012 FINAL   Final  GC/CHLAMYDIA PROBE AMP     Status: None   Collection Time    08/10/12  2:40 PM      Result Value Range Status   CT Probe RNA NEGATIVE  NEGATIVE Final   GC Probe RNA NEGATIVE  NEGATIVE Final   Comment: (NOTE)                                                                                                        Assay performed using the Gen-Probe APTIMA COMBO2 (R) Assay.     Acceptable specimen types for this assay include APTIMA Swabs (Unisex,     endocervical, urethral, or vaginal), first void urine, and ThinPrep     liquid based cytology samples.     Studies: Dg Chest 2 View  08/13/2012  *RADIOLOGY REPORT*  Clinical Data: Sickle cell pain, crisis.  CHEST - 2 VIEW  Comparison: 08/10/2012   Findings: Heart is normal size.  Lungs are clear.  No effusions or acute bony abnormality.  IMPRESSION: No acute cardiopulmonary disease.   Original Report Authenticated By: Charlett Nose, M.D.    Dg Knee 1-2 Views Left  08/13/2012  *RADIOLOGY REPORT*  Clinical Data: Left knee pain  LEFT KNEE - 1-2 VIEW  Comparison: None.  Findings: No fracture or dislocation is seen.  The joint spaces are preserved.  The visualized soft tissues are unremarkable.  No suprapatellar knee joint effusion.  IMPRESSION: No acute osseous abnormality is seen.   Original Report Authenticated By: Charline Bills, M.D.    Ct Head Wo Contrast  08/14/2012  *RADIOLOGY REPORT*  Clinical Data: Weakness and hand numbness.  CT HEAD WITHOUT CONTRAST  Technique:  Contiguous axial images were obtained from the base of the skull through the vertex without contrast.  Comparison: None.  Findings: The brain demonstrates no evidence of hemorrhage, infarction, edema, mass effect, extra-axial fluid collection, hydrocephalus or mass lesion.  The skull is unremarkable.  IMPRESSION: Normal head CT.   Original Report Authenticated By: Irish Lack, M.D.     Scheduled Meds: . enoxaparin (LOVENOX) injection  40 mg Subcutaneous Q24H  . folic acid  1 mg Oral Daily  . HYDROmorphone PCA 0.3 mg/mL   Intravenous Q4H  . ketorolac  30 mg Intravenous Q6H   Continuous Infusions: . sodium chloride         Sickle cell crisis     Time spent: 25  min    William Jennings  Sickle Cell Pager [phone]. If 8PM-8AM, please contact night-coverage at [email or web address], password [facility] 08/14/2012, 11:41 AM  LOS: 1 day

## 2012-08-14 NOTE — Progress Notes (Signed)
Patient c/o left hand feeling numb. Brisk capillary refill noted. No discoloration noted. Will notify on call provider

## 2012-08-14 NOTE — Progress Notes (Signed)
:   progress note. Chief complaint. Left hand numbness and weakness. History of present illness. This 22 year old male in hospital with sickle cell crisis complained to nursing staff this a.m. about waking up with left hand numbness and weakness. This was reported to me and I came to see the patient at the bedside. I find him alert and oriented. His speech is clear. There is no headache. The patient denies ever having a similar presentation in the past. Vital signs. Temperature 98.3, pulse 91, respiration 16, blood pressure 123/65. O2 sats 98%. General appearance. Young male who is well-developed, alert, cooperative, oriented. No distress.. 3. Cardiac regular rate and rhythm. Lungs. Breathing clear and without distress. Abdomen. Soft without pain. Extremities. Capillary refill and radial pulse normal in the left hand. Neurologic. Cranial nerves 2-12 grossly intact. Patient does have complaint of numbness with no clear soft touch deficit. Grip strength is weaker on the left than the right by about one half. No other unilateral or focal defects noted. Impression/plan Problem #1 weakness and numbness left hand. Certainly patient is increased risk of CVA given his history of sickle cell disease. I did contact Dr. Thad Ranger neurology and to my knowledge per nursing the patient was last noted without neurologic deficit about 2 hours ago. Dr. Thad Ranger request we are call code stroke and this has been initiated. I did place order for stat CT scan without contrast. Further orders will be placed by neurology.

## 2012-08-15 LAB — CBC
HCT: 30 % — ABNORMAL LOW (ref 39.0–52.0)
MCH: 27 pg (ref 26.0–34.0)
MCHC: 36.7 g/dL — ABNORMAL HIGH (ref 30.0–36.0)
MCV: 73.5 fL — ABNORMAL LOW (ref 78.0–100.0)
Platelets: 105 10*3/uL — ABNORMAL LOW (ref 150–400)
RDW: 18.2 % — ABNORMAL HIGH (ref 11.5–15.5)

## 2012-08-15 NOTE — Progress Notes (Signed)
SICKLE CELL SERVICE PROGRESS NOTE  KIMOTHY KISHIMOTO ZOX:[id number] DOB: 08/28/1990 DOA: 08/13/2012 PCP: August Saucer ERIC, MD  Assessment/Plan: Active Problems:   Sickle cell crisis;  Pt has been having more frequent crises lately. Discussed hydrea with patient and he is willing to consider resuming. Pt has used 17.49 mg of Dilaudid on the PCA. Will consider transitioning to oral medications tomorrow if use still within the same range.    Rash and other nonspecific skin eruption: Pt was seen in urgent care prior to hospitalization. Rash resolved.   Code Status: Full Code Family Communication: N/A Disposition Plan: Home  MATTHEWS,MICHELLE A.  Pager [phone]. If 7PM-7AM, please contact night-coverage.  08/15/2012, 5:45 PM  LOS: 2 days   Brief narrative: 22 year old African American male with history of sickle cell disease who presented to the ED with symptoms of worsened bilateral knee and leg pains and back pain for past 2 days. He should was admitted to the hospital one month back with sickle cell crisis and discharged home. He informs that since then he hasn't fully recovered with the symptoms and has bilateral leg pains especially in his knees and appear to be swollen by the end of the day. He has difficulty ambulating and even getting on his feet because of the pain several times during the day. He denies any chest pain, palpitations, shortness of breath, headache, fever, chills, dizziness, abdominal pain, nausea, vomiting, denies bowel or urinary symptoms. Denies any trauma. He informs taking Vicodin and ibuprofen at home which has not relieved his symptoms. His symptoms have got worse over past 2 days and is similar to sickle cell crisis. In addition to this he has started developing a papular rash over his chest abdomen and the back since past 4 days. Denies eating anything outside or using new clothes or Linen. Denies any new medications. Denies having any pets at home.  In the ED patient was given  6 mg of morphine IV x2 followed by 1 mg on IV Dilaudid which slightly improved his symptoms. He was also given a dose of IV Benadryl for his itching. Patient informs taking Benadryl at home for his itching which really has not improved his symptoms.  Absent unremarkable. Chest x-ray was normal as well. [facility] hospitalist called to admit patient for sickle cell crisis.   Consultants:  None  Procedures:  None  Antibiotics:  None  HPI/Subjective: Pt stated that pain localized to back and legs. Rates as 9/10.  Objective: Filed Vitals:   08/15/12 0703 08/15/12 1129 08/15/12 1320 08/15/12 1600  BP:   121/71   Pulse:   87   Temp:   98.5 F (36.9 C)   TempSrc:   Oral   Resp: 14 12 15 10   Height:      Weight:      SpO2: 100% 100% 100%    Weight change: 0 kg (0 oz)  Intake/Output Summary (Last 24 hours) at 08/15/12 1745 Last data filed at 08/15/12 1500  Gross per 24 hour  Intake   2280 ml  Output   3225 ml  Net   -945 ml    General: Alert, awake, oriented x3, in no acute distress.  HEENT: [place]/AT PEERL, EOMI, Anicteric Neck: Trachea midline,  no masses, no thyromegal,y no JVD, no carotid bruit  Heart: Regular rate and rhythm, without murmurs, rubs, gallops.  Lungs: Clear to auscultation, no wheezing or rhonchi noted.  Abdomen: Soft, nontender, nondistended, positive bowel sounds, no masses no hepatosplenomegaly noted.  Neuro: No  focal neurological deficits noted cranial nerves II through XII grossly intact. DTRs 2+ bilaterally upper and lower extremities. Strength functional in bilateral upper and lower extremities. Musculoskeletal: No warm swelling or erythema around joints.   Data Reviewed: Basic Metabolic Panel:  Recent Labs Lab 08/10/12 1400 08/13/12 1004  NA 137 135  K 4.0 4.2  CL 104 101  CO2 26 27  GLUCOSE 94 96  BUN 7 8  CREATININE 0.80 0.86  CALCIUM 9.5 9.3   Liver Function Tests:  Recent Labs Lab 08/13/12 1004  AST 43*  ALT 32  ALKPHOS 136*   BILITOT 1.3*  PROT 8.9*  ALBUMIN 3.8   No results found for this basename: LIPASE, AMYLASE,  in the last 168 hours No results found for this basename: AMMONIA,  in the last 168 hours CBC:  Recent Labs Lab 08/10/12 1400 08/13/12 1004 08/15/12 0505  WBC 5.4 7.6 10.1  NEUTROABS 3.4 3.8  --   HGB 11.8* 11.8* 11.0*  HCT 32.3* 32.2* 30.0*  MCV 75.8* 74.9* 73.5*  PLT 154 160 105*   Cardiac Enzymes: No results found for this basename: CKTOTAL, CKMB, CKMBINDEX, TROPONINI,  in the last 168 hours BNP (last 3 results) No results found for this basename: PROBNP,  in the last 8760 hours CBG: No results found for this basename: GLUCAP,  in the last 168 hours     Studies: Dg Chest 2 View  08/13/2012  *RADIOLOGY REPORT*  Clinical Data: Sickle cell pain, crisis.  CHEST - 2 VIEW  Comparison: 08/10/2012  Findings: Heart is normal size.  Lungs are clear.  No effusions or acute bony abnormality.  IMPRESSION: No acute cardiopulmonary disease.   Original Report Authenticated By: Charlett Nose, M.D.    Dg Chest 2 View  08/10/2012  *RADIOLOGY REPORT*  Clinical Data: Sickle cell pain.  Joint pain.  CHEST - 2 VIEW  Comparison: 06/28/2012  Findings: Thoracic verbal endplate changes of sickle cell disease.  Midline trachea.  Mild convex right thoracic spine curvature. Normal heart size and mediastinal contours. No pleural effusion or pneumothorax.  Clear lungs.  IMPRESSION: No acute cardiopulmonary disease.  Osseous findings of sickle cell disease.   Original Report Authenticated By: Jeronimo Greaves, M.D.    Dg Knee 1-2 Views Left  08/13/2012  *RADIOLOGY REPORT*  Clinical Data: Left knee pain  LEFT KNEE - 1-2 VIEW  Comparison: None.  Findings: No fracture or dislocation is seen.  The joint spaces are preserved.  The visualized soft tissues are unremarkable.  No suprapatellar knee joint effusion.  IMPRESSION: No acute osseous abnormality is seen.   Original Report Authenticated By: Charline Bills, M.D.    Ct  Head Wo Contrast  08/14/2012  *RADIOLOGY REPORT*  Clinical Data: Weakness and hand numbness.  CT HEAD WITHOUT CONTRAST  Technique:  Contiguous axial images were obtained from the base of the skull through the vertex without contrast.  Comparison: None.  Findings: The brain demonstrates no evidence of hemorrhage, infarction, edema, mass effect, extra-axial fluid collection, hydrocephalus or mass lesion.  The skull is unremarkable.  IMPRESSION: Normal head CT.   Original Report Authenticated By: Irish Lack, M.D.    Mr Brain Wo Contrast  08/14/2012  *RADIOLOGY REPORT*  Clinical Data: Sickle cell crisis.  Numbness and pain in the left hand.  MRI HEAD WITHOUT CONTRAST  Technique:  Multiplanar, multiecho pulse sequences of the brain and surrounding structures were obtained according to standard protocol without intravenous contrast.  Comparison: Head CT 08/14/2012  Findings: The brain  has a normal appearance on all pulse sequences without evidence of malformation, atrophy, old or acute infarction, mass lesion, hemorrhage, hydrocephalus or extra-axial collection. No pituitary mass.  Sinuses are clear.  Marrow spaces show a hypercellular pattern consistent with the patient's sickle cell diagnosis.  IMPRESSION: Normal appearance of the brain.  No cause of the patient's described symptoms is identified.   Original Report Authenticated By: Paulina Fusi, M.D.     Scheduled Meds: . enoxaparin (LOVENOX) injection  40 mg Subcutaneous Q24H  . folic acid  1 mg Oral Daily  . HYDROmorphone PCA 0.3 mg/mL   Intravenous Q4H  . ketorolac  30 mg Intravenous Q6H   Continuous Infusions: . sodium chloride 75 mL/hr at 08/15/12 1605    Active Problems:   Sickle cell crisis   Rash and other nonspecific skin eruption

## 2012-08-15 NOTE — Care Management Note (Addendum)
    Page 1 of 1   08/17/2012     3:05:48 PM   CARE MANAGEMENT NOTE 08/17/2012  Patient:  William Jennings, William Jennings   Account Number:  [id number]  Date Initiated:  08/15/2012  Documentation initiated by:  Lanier Clam  Subjective/Objective Assessment:   ADMITTED W/BACK PAIN,& BILATERAL LOWER LEG PAIN.SICKLE CELL CRISIS.     Action/Plan:   FROM HOME W/SUPPORT.HAS PCP,PHARMACY.   Anticipated DC Date:  08/18/2012   Anticipated DC Plan:  HOME/SELF CARE      DC Planning Services  CM consult  MATCH Program      Choice offered to / List presented to:             Status of service:  In process, will continue to follow Medicare Important Message given?   (If response is "NO", the following Medicare IM given date fields will be blank) Date Medicare IM given:   Date Additional Medicare IM given:    Discharge Disposition:    Per UR Regulation:  Reviewed for med. necessity/level of care/duration of stay  If discussed at Long Length of Stay Meetings, dates discussed:    Comments:  08/17/12 KATHY MAHABIR RN,BSN NCM [phone] [facility] WILL BE ABLE TO PROVIDE COMPOUNDED FORM/LIQUID FOR HYDROXYUREA 1000MG  PO QD(ANGIE-PHARMACIST-TEL#[phone]/FX#[phone]).COMPOUNDING HOURS:M-F [id number] 24HR NOTICE, & SCRIPT.COST $65.PATIENT QUALIFIES FOR [facility] MATCH PROGRAM(MED ASST),EXPLAINED TO PATIENT THIS MED ASST IS A 1 TIME SERVICE WITHIN 12MONTHS/CO-PAY $3).PATIENT SAYS HE IS ABLE TO AFFORD THE CO-PAY OF $3.ONCE SCRIPT RECEIVED WILL FAX TO [facility].MICHELLE EDWARDS(NP) UPDATED. CONTINUE TO FOLLOW FOR D/C NEEDS.  08/15/12 KATHY MAHABIR RN,BSN NCM [phone]

## 2012-08-16 LAB — COMPREHENSIVE METABOLIC PANEL
AST: 48 U/L — ABNORMAL HIGH (ref 0–37)
BUN: 8 mg/dL (ref 6–23)
CO2: 30 mEq/L (ref 19–32)
Chloride: 98 mEq/L (ref 96–112)
Creatinine, Ser: 0.78 mg/dL (ref 0.50–1.35)
GFR calc Af Amer: >90 mL/min (ref >90)
GFR calc non Af Amer: >90 mL/min (ref >90)
Glucose, Bld: 108 mg/dL — ABNORMAL HIGH (ref 70–99)
Total Bilirubin: 1.3 mg/dL — ABNORMAL HIGH (ref 0.3–1.2)

## 2012-08-16 MED ORDER — HYDROMORPHONE HCL 4 MG PO TABS
8.0000 mg | ORAL_TABLET | ORAL | Status: DC
  Administered 2012-08-16 – 2012-08-21 (×25): 8 mg via ORAL
  Filled 2012-08-16: qty 2
  Filled 2012-08-16 (×2): qty 4
  Filled 2012-08-16 (×2): qty 2
  Filled 2012-08-16: qty 4
  Filled 2012-08-16 (×3): qty 2
  Filled 2012-08-16 (×2): qty 4
  Filled 2012-08-16 (×5): qty 2
  Filled 2012-08-16: qty 4
  Filled 2012-08-16 (×4): qty 2
  Filled 2012-08-16 (×4): qty 4
  Filled 2012-08-16 (×2): qty 2

## 2012-08-16 MED ORDER — HYDROXYUREA 500 MG PO CAPS
1000.0000 mg | ORAL_CAPSULE | Freq: Every day | ORAL | Status: DC
  Administered 2012-08-16 – 2012-08-18 (×3): 1000 mg via ORAL
  Filled 2012-08-16 (×3): qty 2

## 2012-08-16 MED ORDER — HYDROXYUREA 100 MG/ML ORAL SUSPENSION: 15.0000 mg/kg | Freq: Every day | ORAL | Status: DC

## 2012-08-16 NOTE — Progress Notes (Signed)
SICKLE CELL SERVICE PROGRESS NOTE  William Jennings RUE:[id number] DOB: 03/09/1991 DOA: 08/13/2012 PCP: August Saucer ERIC, MD  Assessment/Plan: Active Problems:   Sickle cell crisis;  Pt has been having more frequent crises lately. Discussed hydrea with patient and he is willing to consider resuming. Pt has used 18.38 mg of Dilaudid on the PCA. Will  transition to oral medications and IV for breakthrough pain.    Rash and other nonspecific skin eruption: Pt was seen in urgent care prior to hospitalization. Rash resolved.   Code Status: Full Code Family Communication: N/A Disposition Plan: Home  MATTHEWS,MICHELLE A.  Pager [phone]. If 7PM-7AM, please contact night-coverage.  08/16/2012, 6:53 PM  LOS: 3 days   Brief narrative: 22 year old African American male with history of sickle cell disease who presented to the ED with symptoms of worsened bilateral knee and leg pains and back pain for past 2 days. He should was admitted to the hospital one month back with sickle cell crisis and discharged home. He informs that since then he hasn't fully recovered with the symptoms and has bilateral leg pains especially in his knees and appear to be swollen by the end of the day. He has difficulty ambulating and even getting on his feet because of the pain several times during the day. He denies any chest pain, palpitations, shortness of breath, headache, fever, chills, dizziness, abdominal pain, nausea, vomiting, denies bowel or urinary symptoms. Denies any trauma. He informs taking Vicodin and ibuprofen at home which has not relieved his symptoms. His symptoms have got worse over past 2 days and is similar to sickle cell crisis. In addition to this he has started developing a papular rash over his chest abdomen and the back since past 4 days. Denies eating anything outside or using new clothes or Linen. Denies any new medications. Denies having any pets at home.  In the ED patient was given 6 mg of morphine IV x2  followed by 1 mg on IV Dilaudid which slightly improved his symptoms. He was also given a dose of IV Benadryl for his itching. Patient informs taking Benadryl at home for his itching which really has not improved his symptoms.  Absent unremarkable. Chest x-ray was normal as well. [facility] hospitalist called to admit patient for sickle cell crisis.   Consultants:  None  Procedures:  None  Antibiotics:  None  HPI/Subjective: Pt stated that pain localized to back and legs. Rates as 7-8/10.  Objective: Filed Vitals:   08/16/12 0816 08/16/12 1250 08/16/12 1325 08/16/12 1849  BP:   115/81 124/66  Pulse:   97 97  Temp:   97.4 F (36.3 C) 98.1 F (36.7 C)  TempSrc:   Oral Oral  Resp: 15 15 16 20   Height:      Weight:      SpO2: 100% 100% 100% 100%   Weight change:   Intake/Output Summary (Last 24 hours) at 08/16/12 1853 Last data filed at 08/16/12 1552  Gross per 24 hour  Intake   1335 ml  Output   2050 ml  Net   -715 ml    General: Alert, awake, oriented x3, in no acute distress.  HEENT: [place]/AT PEERL, EOMI, Anicteric Neck: Trachea midline,  no masses, no thyromegal,y no JVD, no carotid bruit Heart: Regular rate and rhythm, without murmurs, rubs, gallops.  Lungs: Clear to auscultation, no wheezing or rhonchi noted.  Abdomen: Soft, nontender, nondistended, positive bowel sounds, no masses no hepatosplenomegaly noted.  Neuro: No focal neurological deficits noted  cranial nerves II through XII grossly intact.Strength normal in bilateral upper and lower extremities. Musculoskeletal: No warm swelling or erythema around joints.   Data Reviewed: Basic Metabolic Panel:  Recent Labs Lab 08/10/12 1400 08/13/12 1004 08/16/12 0500  NA 137 135 135  K 4.0 4.2 4.1  CL 104 101 98  CO2 26 27 30   GLUCOSE 94 96 108*  BUN 7 8 8   CREATININE 0.80 0.86 0.78  CALCIUM 9.5 9.3 9.1   Liver Function Tests:  Recent Labs Lab 08/13/12 1004 08/16/12 0500  AST 43* 48*  ALT 32 43   ALKPHOS 136* 157*  BILITOT 1.3* 1.3*  PROT 8.9* 7.9  ALBUMIN 3.8 3.1*   No results found for this basename: LIPASE, AMYLASE,  in the last 168 hours No results found for this basename: AMMONIA,  in the last 168 hours CBC:  Recent Labs Lab 08/10/12 1400 08/13/12 1004 08/15/12 0505  WBC 5.4 7.6 10.1  NEUTROABS 3.4 3.8  --   HGB 11.8* 11.8* 11.0*  HCT 32.3* 32.2* 30.0*  MCV 75.8* 74.9* 73.5*  PLT 154 160 105*   Cardiac Enzymes: No results found for this basename: CKTOTAL, CKMB, CKMBINDEX, TROPONINI,  in the last 168 hours BNP (last 3 results) No results found for this basename: PROBNP,  in the last 8760 hours CBG: No results found for this basename: GLUCAP,  in the last 168 hours     Studies: Dg Chest 2 View  08/13/2012  *RADIOLOGY REPORT*  Clinical Data: Sickle cell pain, crisis.  CHEST - 2 VIEW  Comparison: 08/10/2012  Findings: Heart is normal size.  Lungs are clear.  No effusions or acute bony abnormality.  IMPRESSION: No acute cardiopulmonary disease.   Original Report Authenticated By: Charlett Nose, M.D.    Dg Chest 2 View  08/10/2012  *RADIOLOGY REPORT*  Clinical Data: Sickle cell pain.  Joint pain.  CHEST - 2 VIEW  Comparison: 06/28/2012  Findings: Thoracic verbal endplate changes of sickle cell disease.  Midline trachea.  Mild convex right thoracic spine curvature. Normal heart size and mediastinal contours. No pleural effusion or pneumothorax.  Clear lungs.  IMPRESSION: No acute cardiopulmonary disease.  Osseous findings of sickle cell disease.   Original Report Authenticated By: Jeronimo Greaves, M.D.    Dg Knee 1-2 Views Left  08/13/2012  *RADIOLOGY REPORT*  Clinical Data: Left knee pain  LEFT KNEE - 1-2 VIEW  Comparison: None.  Findings: No fracture or dislocation is seen.  The joint spaces are preserved.  The visualized soft tissues are unremarkable.  No suprapatellar knee joint effusion.  IMPRESSION: No acute osseous abnormality is seen.   Original Report Authenticated By:  Charline Bills, M.D.    Ct Head Wo Contrast  08/14/2012  *RADIOLOGY REPORT*  Clinical Data: Weakness and hand numbness.  CT HEAD WITHOUT CONTRAST  Technique:  Contiguous axial images were obtained from the base of the skull through the vertex without contrast.  Comparison: None.  Findings: The brain demonstrates no evidence of hemorrhage, infarction, edema, mass effect, extra-axial fluid collection, hydrocephalus or mass lesion.  The skull is unremarkable.  IMPRESSION: Normal head CT.   Original Report Authenticated By: Irish Lack, M.D.    Mr Brain Wo Contrast  08/14/2012  *RADIOLOGY REPORT*  Clinical Data: Sickle cell crisis.  Numbness and pain in the left hand.  MRI HEAD WITHOUT CONTRAST  Technique:  Multiplanar, multiecho pulse sequences of the brain and surrounding structures were obtained according to standard protocol without intravenous contrast.  Comparison: Head  CT 08/14/2012  Findings: The brain has a normal appearance on all pulse sequences without evidence of malformation, atrophy, old or acute infarction, mass lesion, hemorrhage, hydrocephalus or extra-axial collection. No pituitary mass.  Sinuses are clear.  Marrow spaces show a hypercellular pattern consistent with the patient's sickle cell diagnosis.  IMPRESSION: Normal appearance of the brain.  No cause of the patient's described symptoms is identified.   Original Report Authenticated By: Paulina Fusi, M.D.     Scheduled Meds: . enoxaparin (LOVENOX) injection  40 mg Subcutaneous Q24H  . folic acid  1 mg Oral Daily  . HYDROmorphone  8 mg Oral Q4H  . hydroxyurea  1,000 mg Oral Daily  . ketorolac  30 mg Intravenous Q6H   Continuous Infusions: . sodium chloride 10 mL/hr at 08/16/12 1303    Active Problems:   Sickle cell crisis   Rash and other nonspecific skin eruption

## 2012-08-17 MED ORDER — POLYETHYLENE GLYCOL 3350 17 G PO PACK
17.0000 g | PACK | Freq: Every day | ORAL | Status: DC | PRN
  Filled 2012-08-17: qty 1

## 2012-08-17 MED ORDER — CYCLOBENZAPRINE HCL 5 MG PO TABS
5.0000 mg | ORAL_TABLET | Freq: Three times a day (TID) | ORAL | Status: AC
  Administered 2012-08-17 – 2012-08-18 (×3): 5 mg via ORAL
  Filled 2012-08-17 (×3): qty 1

## 2012-08-17 MED ORDER — CYCLOBENZAPRINE HCL 5 MG PO TABS
5.0000 mg | ORAL_TABLET | Freq: Three times a day (TID) | ORAL | Status: DC | PRN
  Filled 2012-08-17: qty 1

## 2012-08-17 MED ORDER — SENNOSIDES-DOCUSATE SODIUM 8.6-50 MG PO TABS
1.0000 | ORAL_TABLET | Freq: Two times a day (BID) | ORAL | Status: DC
  Administered 2012-08-17 – 2012-08-23 (×11): 1 via ORAL
  Filled 2012-08-17 (×15): qty 1

## 2012-08-17 MED ORDER — POLYETHYLENE GLYCOL 3350 17 G PO PACK
17.0000 g | PACK | Freq: Once | ORAL | Status: AC
  Administered 2012-08-17: 17 g via ORAL
  Filled 2012-08-17: qty 1

## 2012-08-17 MED ORDER — HYDROMORPHONE HCL PF 2 MG/ML IJ SOLN
2.0000 mg | INTRAMUSCULAR | Status: DC | PRN
  Administered 2012-08-17 – 2012-08-20 (×19): 2 mg via INTRAVENOUS
  Filled 2012-08-17 (×20): qty 1

## 2012-08-17 NOTE — Progress Notes (Signed)
SICKLE CELL SERVICE PROGRESS NOTE  William Jennings ZOX:[id number] DOB: 12-06-1990 DOA: 08/13/2012 PCP: August Saucer ERIC, MD  Assessment/Plan: Active Problems:   Sickle cell crisis: Pt states that he did not ask for any breakthrough medication last night and he feels that his pain has increase in intensity since yesterday.  Pt has been having more frequent crises lately. He was started on Hydrea last night and reports that he opened the capsule into apple sauce as he had been doing before. I advised patient that this was not an accepted way of taking Hydrea.   Muscle Spasm: Pt complained of pain in left shoulder region. Examination revealed spasm in the upper trapezius and Rhomboids as compared to right side. Will order flexeril and encourage use of heat to the area.  Numbness RUE: Pt voiced complaint to day that he is still feeling numbness in RUE from elbow joint down to fingers. He has no perceived or objective weakness on examination. I suspect that this may be a result of the muscle spasms affecting the nerve plexus. Will re-evaluate tomorrow.   Rash and other nonspecific skin eruption: Pt was seen in urgent care prior to hospitalization. Rash resolved.   Code Status: Full Code Family Communication: N/A Disposition Plan: Home in 48 to 72 hours  MATTHEWS,MICHELLE A.  Pager [phone]. If 7PM-7AM, please contact night-coverage.  08/17/2012, 4:20 PM  LOS: 4 days   Brief narrative: 22 year old African American male with history of sickle cell disease who presented to the ED with symptoms of worsened bilateral knee and leg pains and back pain for past 2 days. He should was admitted to the hospital one month back with sickle cell crisis and discharged home. He informs that since then he hasn't fully recovered with the symptoms and has bilateral leg pains especially in his knees and appear to be swollen by the end of the day. He has difficulty ambulating and even getting on his feet because of the pain  several times during the day. He denies any chest pain, palpitations, shortness of breath, headache, fever, chills, dizziness, abdominal pain, nausea, vomiting, denies bowel or urinary symptoms. Denies any trauma. He informs taking Vicodin and ibuprofen at home which has not relieved his symptoms. His symptoms have got worse over past 2 days and is similar to sickle cell crisis. In addition to this he has started developing a papular rash over his chest abdomen and the back since past 4 days. Denies eating anything outside or using new clothes or Linen. Denies any new medications. Denies having any pets at home.  In the ED patient was given 6 mg of morphine IV x2 followed by 1 mg on IV Dilaudid which slightly improved his symptoms. He was also given a dose of IV Benadryl for his itching. Patient informs taking Benadryl at home for his itching which really has not improved his symptoms.  Absent unremarkable. Chest x-ray was normal as well. [facility] hospitalist called to admit patient for sickle cell crisis.   Consultants:  None  Procedures:  None  Antibiotics:  None  HPI/Subjective: Pt stated that pain localized to back and legs. Rates as 8/10. Pt complained of pain in left shoulder region and a  feeling of numbness in RUE from elbow joint down to fingers. He has no perceived weakness.  Objective: Filed Vitals:   08/16/12 1849 08/16/12 2310 08/17/12 0430 08/17/12 1414  BP: 124/66 129/70 121/64 124/68  Pulse: 97 97 96 99  Temp: 98.1 F (36.7 C) 99 F (  37.2 C) 98.7 F (37.1 C) 98 F (36.7 C)  TempSrc: Oral Oral Oral Oral  Resp: 20 20 16 18   Height:      Weight:      SpO2: 100% 99% 99% 100%   Weight change:   Intake/Output Summary (Last 24 hours) at 08/17/12 1620 Last data filed at 08/17/12 1500  Gross per 24 hour  Intake   1558 ml  Output   2375 ml  Net   -817 ml    General: Alert, awake, oriented x3, in no acute distress.  HEENT: [place]/AT PEERL, EOMI, Anicteric Neck: Trachea  midline,  no masses, no thyromegal,y no JVD, no carotid bruit. Spasm palpated in region of left shoulder and back.  Heart: Regular rate and rhythm, without murmurs, rubs, gallops.  Lungs: Clear to auscultation, no wheezing or rhonchi noted.  Abdomen: Soft, nontender, nondistended, positive bowel sounds, no masses no hepatosplenomegaly noted.  Neuro: No focal neurological deficits noted cranial nerves II through XII grossly intact.Strength normal in bilateral upper and lower extremities. Grip strength equal in BUE's. No Ulnar drift noted on examination. Musculoskeletal: No warm swelling or erythema around joints.   Data Reviewed: Basic Metabolic Panel:  Recent Labs Lab 08/13/12 1004 08/16/12 0500  NA 135 135  K 4.2 4.1  CL 101 98  CO2 27 30  GLUCOSE 96 108*  BUN 8 8  CREATININE 0.86 0.78  CALCIUM 9.3 9.1   Liver Function Tests:  Recent Labs Lab 08/13/12 1004 08/16/12 0500  AST 43* 48*  ALT 32 43  ALKPHOS 136* 157*  BILITOT 1.3* 1.3*  PROT 8.9* 7.9  ALBUMIN 3.8 3.1*   No results found for this basename: LIPASE, AMYLASE,  in the last 168 hours No results found for this basename: AMMONIA,  in the last 168 hours CBC:  Recent Labs Lab 08/13/12 1004 08/15/12 0505  WBC 7.6 10.1  NEUTROABS 3.8  --   HGB 11.8* 11.0*  HCT 32.2* 30.0*  MCV 74.9* 73.5*  PLT 160 105*   Cardiac Enzymes: No results found for this basename: CKTOTAL, CKMB, CKMBINDEX, TROPONINI,  in the last 168 hours BNP (last 3 results) No results found for this basename: PROBNP,  in the last 8760 hours CBG: No results found for this basename: GLUCAP,  in the last 168 hours     Studies: Dg Chest 2 View  08/13/2012  *RADIOLOGY REPORT*  Clinical Data: Sickle cell pain, crisis.  CHEST - 2 VIEW  Comparison: 08/10/2012  Findings: Heart is normal size.  Lungs are clear.  No effusions or acute bony abnormality.  IMPRESSION: No acute cardiopulmonary disease.   Original Report Authenticated By: Charlett Nose, M.D.     Dg Chest 2 View  08/10/2012  *RADIOLOGY REPORT*  Clinical Data: Sickle cell pain.  Joint pain.  CHEST - 2 VIEW  Comparison: 06/28/2012  Findings: Thoracic verbal endplate changes of sickle cell disease.  Midline trachea.  Mild convex right thoracic spine curvature. Normal heart size and mediastinal contours. No pleural effusion or pneumothorax.  Clear lungs.  IMPRESSION: No acute cardiopulmonary disease.  Osseous findings of sickle cell disease.   Original Report Authenticated By: Jeronimo Greaves, M.D.    Dg Knee 1-2 Views Left  08/13/2012  *RADIOLOGY REPORT*  Clinical Data: Left knee pain  LEFT KNEE - 1-2 VIEW  Comparison: None.  Findings: No fracture or dislocation is seen.  The joint spaces are preserved.  The visualized soft tissues are unremarkable.  No suprapatellar knee joint effusion.  IMPRESSION:  No acute osseous abnormality is seen.   Original Report Authenticated By: Charline Bills, M.D.    Ct Head Wo Contrast  08/14/2012  *RADIOLOGY REPORT*  Clinical Data: Weakness and hand numbness.  CT HEAD WITHOUT CONTRAST  Technique:  Contiguous axial images were obtained from the base of the skull through the vertex without contrast.  Comparison: None.  Findings: The brain demonstrates no evidence of hemorrhage, infarction, edema, mass effect, extra-axial fluid collection, hydrocephalus or mass lesion.  The skull is unremarkable.  IMPRESSION: Normal head CT.   Original Report Authenticated By: Irish Lack, M.D.    Mr Brain Wo Contrast  08/14/2012  *RADIOLOGY REPORT*  Clinical Data: Sickle cell crisis.  Numbness and pain in the left hand.  MRI HEAD WITHOUT CONTRAST  Technique:  Multiplanar, multiecho pulse sequences of the brain and surrounding structures were obtained according to standard protocol without intravenous contrast.  Comparison: Head CT 08/14/2012  Findings: The brain has a normal appearance on all pulse sequences without evidence of malformation, atrophy, old or acute infarction, mass  lesion, hemorrhage, hydrocephalus or extra-axial collection. No pituitary mass.  Sinuses are clear.  Marrow spaces show a hypercellular pattern consistent with the patient's sickle cell diagnosis.  IMPRESSION: Normal appearance of the brain.  No cause of the patient's described symptoms is identified.   Original Report Authenticated By: Paulina Fusi, M.D.     Scheduled Meds: . cyclobenzaprine  5 mg Oral TID  . enoxaparin (LOVENOX) injection  40 mg Subcutaneous Q24H  . folic acid  1 mg Oral Daily  . HYDROmorphone  8 mg Oral Q4H  . hydroxyurea  1,000 mg Oral Daily  . ketorolac  30 mg Intravenous Q6H  . senna-docusate  1 tablet Oral BID   Continuous Infusions: . sodium chloride 10 mL/hr at 08/16/12 1303    Active Problems:   Sickle cell crisis   Rash and other nonspecific skin eruption

## 2012-08-17 NOTE — ED Provider Notes (Signed)
Medical screening examination/treatment/procedure(s) were performed by non-physician practitioner and as supervising physician I was immediately available for consultation/collaboration.   David H Yao, MD 08/17/12 0904 

## 2012-08-18 LAB — CBC WITH DIFFERENTIAL/PLATELET
Eosinophils Relative: 5 % (ref 0–5)
Lymphocytes Relative: 38 % (ref 12–46)
MCH: 26.6 pg (ref 26.0–34.0)
Monocytes Absolute: 1.1 10*3/uL — ABNORMAL HIGH (ref 0.1–1.0)
Neutrophils Relative %: 45 % (ref 43–77)
Platelets: 85 10*3/uL — ABNORMAL LOW (ref 150–400)
RBC: 4.02 MIL/uL — ABNORMAL LOW (ref 4.22–5.81)
WBC: 9.1 10*3/uL (ref 4.0–10.5)

## 2012-08-18 NOTE — Progress Notes (Signed)
SICKLE CELL SERVICE PROGRESS NOTE  William Jennings ZOX:[id number] DOB: Oct 20, 1990 DOA: 08/13/2012 PCP: August Saucer ERIC, MD  Assessment/Plan: Active Problems:   Sickle cell crisis: Pt has not been receiving his scheduled oral medications and just receiving IV analgesics intended for breakthrough pain. I have reviewed with nursing that patient should receive scheduled oral Dilaudid and IV is to be used on a when necessary basis as ordered. Thus we'll continue the current regimen and evaluate effectiveness tomorrow. I suspect that with the severe change in barometric pressure, this is acting as a precipitant for further pain crisis.  Muscle Spasm: Pt complained of pain in left shoulder region. Examination revealed spasm in the upper trapezius and Rhomboids as compared to right side. Will order flexeril and encourage use of heat to the area.  Numbness RUE: Patient continues to complain of "numbness", however sensation is intact in the left upper extremity. He has no physical deficits. We'll just continue to observe   Rash and other nonspecific skin eruption: Patient now has maculopapular rash along the anterior chest wall which is new today. The patient states it is somewhat pruritic however it is nonerythematous. There is no warmth associated with it. The only new medication started with hydroxyurea which can cause a rash. Thus I will stop the hydroxyurea and observe for resolution of the rash.  Code Status: Full Code Family Communication: N/A Disposition Plan: Home in 48 to 72 hours  MATTHEWS,MICHELLE A.  Pager [phone]. If 7PM-7AM, please contact night-coverage.  08/18/2012, 2:42 PM  LOS: 5 days   Brief narrative: 22 year old African American male with history of sickle cell disease who presented to the ED with symptoms of worsened bilateral knee and leg pains and back pain for past 2 days. He should was admitted to the hospital one month back with sickle cell crisis and discharged home. He informs  that since then he hasn't fully recovered with the symptoms and has bilateral leg pains especially in his knees and appear to be swollen by the end of the day. He has difficulty ambulating and even getting on his feet because of the pain several times during the day. He denies any chest pain, palpitations, shortness of breath, headache, fever, chills, dizziness, abdominal pain, nausea, vomiting, denies bowel or urinary symptoms. Denies any trauma. He informs taking Vicodin and ibuprofen at home which has not relieved his symptoms. His symptoms have got worse over past 2 days and is similar to sickle cell crisis. In addition to this he has started developing a papular rash over his chest abdomen and the back since past 4 days. Denies eating anything outside or using new clothes or Linen. Denies any new medications. Denies having any pets at home.  In the ED patient was given 6 mg of morphine IV x2 followed by 1 mg on IV Dilaudid which slightly improved his symptoms. He was also given a dose of IV Benadryl for his itching. Patient informs taking Benadryl at home for his itching which really has not improved his symptoms.  Absent unremarkable. Chest x-ray was normal as well. [facility] hospitalist called to admit patient for sickle cell crisis.   Consultants:  None  Procedures:  None  Antibiotics:  None  HPI/Subjective: Pt stated that pain localized to back and legs. Rates as 9/10. Pt complained of pain in left shoulder region yesterday with pain to the patient today is also in his right shoulder knees and elbow. Objective: Filed Vitals:   08/17/12 1414 08/17/12 2038 08/18/12 0456 08/18/12  1415  BP: 124/68 124/78 117/57 120/86  Pulse: 99 103 101 114  Temp: 98 F (36.7 C) 97.7 F (36.5 C) 98.2 F (36.8 C) 98.3 F (36.8 C)  TempSrc: Oral Oral Oral Oral  Resp: 18 16 16 18   Height:      Weight:      SpO2: 100% 97% 98% 100%   Weight change:   Intake/Output Summary (Last 24 hours) at 08/18/12  1442 Last data filed at 08/18/12 1135  Gross per 24 hour  Intake   1270 ml  Output    900 ml  Net    370 ml    General: Alert, awake, oriented x3, in no acute distress.  HEENT: [place]/AT PEERL, EOMI, Anicteric Neck: Trachea midline,  no masses, no thyromegal,y no JVD, no carotid bruit. Spasm palpated in region of left shoulder and back.  Heart: Regular rate and rhythm, without murmurs, rubs, gallops.  Lungs: Clear to auscultation, no wheezing or rhonchi noted.  Abdomen: Soft, nontender, nondistended, positive bowel  Skin: Patient has some areas of hyperpigmentation along the anterior posterior trunk. This is been there since admission which the patient ascribes to resolve rash. However today the patient is noted to have a maculopapular rash on the anterior trunk wall. It is nonerythematous and nonfluctuant in nature.  Neuro: No focal neurological deficits noted cranial nerves II through XII grossly intact.Strength normal in bilateral upper and lower extremities. Grip strength equal in BUE's. No Ulnar drift noted on examination. Musculoskeletal: No warm swelling or erythema around joints.   Data Reviewed: Basic Metabolic Panel:  Recent Labs Lab 08/13/12 1004 08/16/12 0500  NA 135 135  K 4.2 4.1  CL 101 98  CO2 27 30  GLUCOSE 96 108*  BUN 8 8  CREATININE 0.86 0.78  CALCIUM 9.3 9.1   Liver Function Tests:  Recent Labs Lab 08/13/12 1004 08/16/12 0500  AST 43* 48*  ALT 32 43  ALKPHOS 136* 157*  BILITOT 1.3* 1.3*  PROT 8.9* 7.9  ALBUMIN 3.8 3.1*   No results found for this basename: LIPASE, AMYLASE,  in the last 168 hours No results found for this basename: AMMONIA,  in the last 168 hours CBC:  Recent Labs Lab 08/13/12 1004 08/15/12 0505 08/18/12 0459  WBC 7.6 10.1 9.1  NEUTROABS 3.8  --  4.0  HGB 11.8* 11.0* 10.7*  HCT 32.2* 30.0* 29.3*  MCV 74.9* 73.5* 72.9*  PLT 160 105* 85*   Cardiac Enzymes: No results found for this basename: CKTOTAL, CKMB, CKMBINDEX,  TROPONINI,  in the last 168 hours BNP (last 3 results) No results found for this basename: PROBNP,  in the last 8760 hours CBG: No results found for this basename: GLUCAP,  in the last 168 hours     Studies: Dg Chest 2 View  08/13/2012  *RADIOLOGY REPORT*  Clinical Data: Sickle cell pain, crisis.  CHEST - 2 VIEW  Comparison: 08/10/2012  Findings: Heart is normal size.  Lungs are clear.  No effusions or acute bony abnormality.  IMPRESSION: No acute cardiopulmonary disease.   Original Report Authenticated By: Charlett Nose, M.D.    Dg Chest 2 View  08/10/2012  *RADIOLOGY REPORT*  Clinical Data: Sickle cell pain.  Joint pain.  CHEST - 2 VIEW  Comparison: 06/28/2012  Findings: Thoracic verbal endplate changes of sickle cell disease.  Midline trachea.  Mild convex right thoracic spine curvature. Normal heart size and mediastinal contours. No pleural effusion or pneumothorax.  Clear lungs.  IMPRESSION: No acute cardiopulmonary  disease.  Osseous findings of sickle cell disease.   Original Report Authenticated By: Jeronimo Greaves, M.D.    Dg Knee 1-2 Views Left  08/13/2012  *RADIOLOGY REPORT*  Clinical Data: Left knee pain  LEFT KNEE - 1-2 VIEW  Comparison: None.  Findings: No fracture or dislocation is seen.  The joint spaces are preserved.  The visualized soft tissues are unremarkable.  No suprapatellar knee joint effusion.  IMPRESSION: No acute osseous abnormality is seen.   Original Report Authenticated By: Charline Bills, M.D.    Ct Head Wo Contrast  08/14/2012  *RADIOLOGY REPORT*  Clinical Data: Weakness and hand numbness.  CT HEAD WITHOUT CONTRAST  Technique:  Contiguous axial images were obtained from the base of the skull through the vertex without contrast.  Comparison: None.  Findings: The brain demonstrates no evidence of hemorrhage, infarction, edema, mass effect, extra-axial fluid collection, hydrocephalus or mass lesion.  The skull is unremarkable.  IMPRESSION: Normal head CT.   Original Report  Authenticated By: Irish Lack, M.D.    Mr Brain Wo Contrast  08/14/2012  *RADIOLOGY REPORT*  Clinical Data: Sickle cell crisis.  Numbness and pain in the left hand.  MRI HEAD WITHOUT CONTRAST  Technique:  Multiplanar, multiecho pulse sequences of the brain and surrounding structures were obtained according to standard protocol without intravenous contrast.  Comparison: Head CT 08/14/2012  Findings: The brain has a normal appearance on all pulse sequences without evidence of malformation, atrophy, old or acute infarction, mass lesion, hemorrhage, hydrocephalus or extra-axial collection. No pituitary mass.  Sinuses are clear.  Marrow spaces show a hypercellular pattern consistent with the patient's sickle cell diagnosis.  IMPRESSION: Normal appearance of the brain.  No cause of the patient's described symptoms is identified.   Original Report Authenticated By: Paulina Fusi, M.D.     Scheduled Meds: . enoxaparin (LOVENOX) injection  40 mg Subcutaneous Q24H  . folic acid  1 mg Oral Daily  . HYDROmorphone  8 mg Oral Q4H  . ketorolac  30 mg Intravenous Q6H  . senna-docusate  1 tablet Oral BID   Continuous Infusions: . sodium chloride 10 mL/hr at 08/16/12 1303

## 2012-08-19 LAB — CBC
MCH: 27 pg (ref 26.0–34.0)
MCHC: 36.7 g/dL — ABNORMAL HIGH (ref 30.0–36.0)
Platelets: 102 10*3/uL — ABNORMAL LOW (ref 150–400)
RBC: 4.3 MIL/uL (ref 4.22–5.81)

## 2012-08-19 NOTE — Progress Notes (Signed)
Subjective: 22 year old gentleman admitted with sickle cell painful crisis and presumed to have allergies to hydroxyurea. Patient is complaining of 8/10 pain now. He claimed that once his IV Dilaudid PCA was DC'd he has been having more pain. He is currently on combined oral Dilaudid and IV Dilaudid. His current pain level is 6/10. His rashes are getting better since the hydroxyurea was stopped. No fever no chills no nausea vomiting or diarrhea.   Objective: Vital signs in last 24 hours: Temp:  [98.2 F (36.8 C)-98.8 F (37.1 C)] 98.8 F (37.1 C) (03/08 1008) Pulse Rate:  [114-125] 124 (03/08 1008) Resp:  [18-20] 18 (03/08 1008) BP: (115-129)/(55-98) 115/77 mmHg (03/08 1008) SpO2:  [97 %-100 %] 97 % (03/08 1008) Weight:  [60.51 kg (133 lb 6.4 oz)] 60.51 kg (133 lb 6.4 oz) (03/08 0204) Weight change:  Last BM Date: 08/17/12  Intake/Output from previous day: 03/07 0701 - 03/08 0700 In: 730 [P.O.:480; I.V.:250] Out: 1850 [Urine:1850] Intake/Output this shift: Total I/O In: 240 [P.O.:240] Out: 800 [Urine:800]  General appearance: alert, cooperative, appears stated age and no distress Eyes: conjunctivae/corneas clear. PERRL, EOM's intact. Fundi benign. Resp: rales anterior - bilateral Chest wall: no tenderness Cardio: regular rate and rhythm, S1, S2 normal, no murmur, click, rub or gallop GI: soft, non-tender; bowel sounds normal; no masses,  no organomegaly Extremities: extremities normal, atraumatic, no cyanosis or edema Skin: Skin color, texture, turgor normal. No rashes or lesions Neurologic: Grossly normal  Lab Results:  Recent Labs  08/18/12 0459 08/19/12 0436  WBC 9.1 13.0*  HGB 10.7* 11.6*  HCT 29.3* 31.6*  PLT 85* 102*   BMET No results found for this basename: NA, K, CL, CO2, GLUCOSE, BUN, CREATININE, CALCIUM,  in the last 72 hours  Studies/Results: No results found.  Medications: I have reviewed the patient's current  medications.  Assessment/Plan: 22 year old gentleman with known sickle cell pain crisis.  #1 sickle cell pain crisis: Patient is having ongoing crisis according to him. We're continue with the current regimen of pain control using IV and by mouth. He also has Toradol ordered although patient is thinking that his allergies could still be Toradol but the fact that they have improve in rashes despite being on Toradol seems to suggest that it was due to the hydroxyurea. We will therefore continue his current medications and if needed increase the dose of the Dilaudid.  #2 Rash:Better. Continue to hold Hydroxyurea. No new issues.  #3 Leukocytosis: Stable  #4 Dispo: DC when stable.   LOS: 6 days   GARBA,LAWAL 08/19/2012, 1:38 PM

## 2012-08-20 ENCOUNTER — Inpatient Hospital Stay (HOSPITAL_COMMUNITY): Payer: Medicaid Other

## 2012-08-20 DIAGNOSIS — D696 Thrombocytopenia, unspecified: Secondary | ICD-10-CM | POA: Diagnosis present

## 2012-08-20 DIAGNOSIS — D571 Sickle-cell disease without crisis: Secondary | ICD-10-CM

## 2012-08-20 LAB — CBC
Platelets: 111 10*3/uL — ABNORMAL LOW (ref 150–400)
RDW: 19 % — ABNORMAL HIGH (ref 11.5–15.5)
WBC: 16.5 10*3/uL — ABNORMAL HIGH (ref 4.0–10.5)

## 2012-08-20 MED ORDER — ONDANSETRON HCL 4 MG/2ML IJ SOLN: 4.0000 mg | Freq: Four times a day (QID) | INTRAMUSCULAR | Status: DC | PRN

## 2012-08-20 MED ORDER — NALOXONE HCL 0.4 MG/ML IJ SOLN: 0.4000 mg | INTRAMUSCULAR | Status: DC | PRN

## 2012-08-20 MED ORDER — DIPHENHYDRAMINE HCL 50 MG/ML IJ SOLN: 12.5000 mg | Freq: Four times a day (QID) | INTRAMUSCULAR | Status: DC | PRN

## 2012-08-20 MED ORDER — SODIUM CHLORIDE 0.9 % IJ SOLN: 9.0000 mL | INTRAMUSCULAR | Status: DC | PRN

## 2012-08-20 MED ORDER — HYDROMORPHONE 0.3 MG/ML IV SOLN
INTRAVENOUS | Status: DC
  Administered 2012-08-20: 19:00:00 via INTRAVENOUS
  Administered 2012-08-20: 6.49 mg via INTRAVENOUS
  Administered 2012-08-20: 13:00:00 via INTRAVENOUS
  Administered 2012-08-20: 1.99 mg via INTRAVENOUS
  Administered 2012-08-20: 0.498 mg via INTRAVENOUS
  Administered 2012-08-21: 12:00:00 via INTRAVENOUS
  Administered 2012-08-21: 2.3 mg via INTRAVENOUS
  Administered 2012-08-21 (×2): 3.99 mg via INTRAVENOUS
  Administered 2012-08-21: 20:00:00 via INTRAVENOUS
  Administered 2012-08-21: 3.6 mg via INTRAVENOUS
  Administered 2012-08-21: 02:00:00 via INTRAVENOUS
  Administered 2012-08-22: 1 mg via INTRAVENOUS
  Administered 2012-08-22: 0.5 mg via INTRAVENOUS
  Administered 2012-08-22: 2.49 mg via INTRAVENOUS
  Administered 2012-08-22: 0.499 mg via INTRAVENOUS
  Administered 2012-08-22: 1.42 mg via INTRAVENOUS
  Filled 2012-08-20 (×5): qty 25

## 2012-08-20 MED ORDER — DIPHENHYDRAMINE HCL 12.5 MG/5ML PO ELIX: 12.5000 mg | ORAL_SOLUTION | Freq: Four times a day (QID) | ORAL | Status: DC | PRN

## 2012-08-20 NOTE — Progress Notes (Signed)
Subjective: 22 year old gentleman admitted with sickle cell painful crisis and presumed to have allergies to hydroxyurea. Patient is complaining of 8/10 pain. Patient is requesting the Dilaudid PCA. He states that while he was on PCA his pain had gone down to 5 however since yesterday's pain is 8/10. He states that his rash is improving. He complains of pain and stiffness in both hands with numbness and some pain in his neck. This has been going on for the past few weeks.  No chest pain or shortness of breath.  Objective: Vital signs in last 24 hours: Temp:  [98.2 F (36.8 C)-99 F (37.2 C)] 98.6 F (37 C) (03/09 1052) Pulse Rate:  [120-148] 129 (03/09 1100) Resp:  [16-18] 18 (03/09 1052) BP: (98-135)/(63-90) 118/81 mmHg (03/09 1052) SpO2:  [89 %-100 %] 100 % (03/09 1052) Weight change:  Last BM Date: 08/19/12  Intake/Output from previous day: 03/08 0701 - 03/09 0700 In: 600 [P.O.:600] Out: 2000 [Urine:2000] Intake/Output this shift: Total I/O In: -  Out: 900 [Urine:900]  General appearance: alert, cooperative, appears stated age and no distress Eyes: conjunctivae/corneas clear. PERRL, EOM's intact.  Resp: Clear to auscultation bilaterally Chest wall: no tenderness Cardio: Tachycardia, no murmur, rubs or gallop GI: soft, non-tender; bowel sounds normal; no masses,  no organomegaly Extremities: extremities normal, atraumatic, no cyanosis or edema Skin: Skin color, texture, turgor normal. No rashes or lesions Neurologic: Grossly normal, no notable weakness in bilateral lower extremity. No notable weakness bilateral upper extremity good range of motion in the neck. Some minimal tenderness in the cervical spine area to palpation.  Lab Results:  Recent Labs  08/18/12 0459 08/19/12 0436  WBC 9.1 13.0*  HGB 10.7* 11.6*  HCT 29.3* 31.6*  PLT 85* 102*     Assessment/Plan:   #1 sickle cell pain crisis: Patient is having on going pain  crisis according to him. Pain has  increased to 8/10. We will change him back to the Dilaudid PCA. Continue to monitor his pain.  #2 Rash:Better. Continue to hold Hydroxyurea. No new issues. Rash is improving.  #3 Leukocytosis: Increasing. Will monitor for now. Most likely secondary to vaso-occlusive crisis.  #4 Thrombocytopenia: Patient's noted to have thrombocytopenia. This seems to be an ongoing issue intermittently. Will monitor her platelets for now. Looking through the records seem to resolve on its own and normally. Could be medication related.   #5  Tachycardia: Probably pain related. Will monitor for now.  #6 Bilateral hand numbness and neck stiffness : This is an ongoing problem for the past few weeks. He had MRI of the head done last week week that was negative. Will continue to monitor symptoms. Will check a vitamin B 12 level next lab draw. Will get x-ray of the C-spine.   #4 Dispo: DC when stable.   LOS: 7 days   Carollee Massed PAger [phone] 08/20/2012, 12:05 PM

## 2012-08-21 DIAGNOSIS — D72829 Elevated white blood cell count, unspecified: Secondary | ICD-10-CM

## 2012-08-21 LAB — CBC
Hemoglobin: 10 g/dL — ABNORMAL LOW (ref 13.0–17.0)
RBC: 3.91 MIL/uL — ABNORMAL LOW (ref 4.22–5.81)

## 2012-08-21 MED ORDER — METHYLPREDNISOLONE SODIUM SUCC 125 MG IJ SOLR
60.0000 mg | Freq: Once | INTRAMUSCULAR | Status: AC
  Administered 2012-08-21: 60 mg via INTRAVENOUS
  Filled 2012-08-21: qty 0.96

## 2012-08-21 MED ORDER — LORATADINE 10 MG PO TABS
10.0000 mg | ORAL_TABLET | Freq: Every day | ORAL | Status: DC
  Administered 2012-08-22 – 2012-08-23 (×2): 10 mg via ORAL
  Filled 2012-08-21 (×3): qty 1

## 2012-08-21 NOTE — Progress Notes (Signed)
Report given to Rn on 5th floor.

## 2012-08-21 NOTE — Progress Notes (Signed)
Subjective: 22 year old gentleman admitted with sickle cell painful crisis and presumed to have allergies to hydroxyurea. Patient is complaining of 8/10 pain today.  He has noted however some improvement with the PCA.  He has severe itching from the rash. He also complains of increased heart rate with standing.  He states that he no longer has bilateral hand numbness.  Objective: Vital signs in last 24 hours: Temp:  [98.2 F (36.8 C)-99 F (37.2 C)] 98.6 F (37 C) (03/09 1052) Pulse Rate:  [120-148] 129 (03/09 1100) Resp:  [16-18] 18 (03/09 1052) BP: (98-135)/(63-90) 118/81 mmHg (03/09 1052) SpO2:  [89 %-100 %] 100 % (03/09 1052) Weight change:  Last BM Date: 08/19/12  Intake/Output from previous day: 03/08 0701 - 03/09 0700 In: 600 [P.O.:600] Out: 2000 [Urine:2000] Intake/Output this shift: Total I/O In: -  Out: 900 [Urine:900]  General appearance: alert, cooperative, appears stated age and no distress Eyes: conjunctivae/corneas clear. PERRL, EOM's intact.  Resp: Clear to auscultation bilaterally Chest wall: no tenderness Cardio: Tachycardia, no murmur, rubs or gallop GI: soft, non-tender; bowel sounds normal; no masses,  no organomegaly Extremities: extremities normal, atraumatic, no cyanosis or edema Skin:  Rash over the chest area and back. Neurologic: Grossly normal,  Lab Results:  Recent Labs  08/18/12 0459 08/19/12 0436  WBC 9.1 13.0*  HGB 10.7* 11.6*  HCT 29.3* 31.6*  PLT 85* 102*     Assessment/Plan:   #1 Sickle cell pain crisis: Patient  Pain is slightly improved today with the PCA. Continue him on the Dilaudid PCA. Continue the Dilaudid 8 mg orally every 8 hours.  #2 Rash: Continue to hold Hydroxyurea.  the rash looks a little bit worse today in my opinion. He thinks he it looks about the same. Will try one dose of Solu-Medrol IV. Will also place him on Claritin and continue the when necessary Benadryl.   #3 Leukocytosis:  improving . Will monitor for  now. Most likely secondary to vaso-occlusive crisis.  #4 Thrombocytopenia: Patient's noted to have thrombocytopenia. This seems to be an ongoing issue intermittently. Will monitor his platelets for now. Looking through the records seem to resolve on its own and normally. Could be medication related.   #5  Tachycardia: Probably pain related. patient states that when he stands up heart rate goes into the 150s. He does not have any chest pain or shortness of breath. Will restart his IV fluids. Will place him on telemetry. If this continues then will do further evaluation.   #6 Bilateral hand numbness and neck stiffness :  C-spine film was normal. Patient declines getting MRI of the neck. He states that the numbness has resolved and he only has  stiffness in both hands.  #7 Dispo: DC when stable.   LOS: 7 days   Carollee Massed PAger [phone] 08/20/2012, 12:05 PM

## 2012-08-22 LAB — COMPREHENSIVE METABOLIC PANEL
ALT: 43 U/L (ref 0–53)
Alkaline Phosphatase: 152 U/L — ABNORMAL HIGH (ref 39–117)
CO2: 25 mEq/L (ref 19–32)
Calcium: 9.7 mg/dL (ref 8.4–10.5)
Chloride: 93 mEq/L — ABNORMAL LOW (ref 96–112)
GFR calc Af Amer: >90 mL/min (ref >90)
GFR calc non Af Amer: >90 mL/min (ref >90)
Glucose, Bld: 160 mg/dL — ABNORMAL HIGH (ref 70–99)
Potassium: 4.6 mEq/L (ref 3.5–5.1)
Sodium: 129 mEq/L — ABNORMAL LOW (ref 135–145)
Total Bilirubin: 1.5 mg/dL — ABNORMAL HIGH (ref 0.3–1.2)

## 2012-08-22 LAB — CBC
HCT: 26.3 % — ABNORMAL LOW (ref 39.0–52.0)
MCV: 71.1 fL — ABNORMAL LOW (ref 78.0–100.0)
RBC: 3.7 MIL/uL — ABNORMAL LOW (ref 4.22–5.81)
WBC: 10.5 10*3/uL (ref 4.0–10.5)

## 2012-08-22 MED ORDER — POLYETHYLENE GLYCOL 3350 17 G PO PACK
17.0000 g | PACK | Freq: Every day | ORAL | Status: DC
  Administered 2012-08-22: 17 g via ORAL
  Filled 2012-08-22: qty 1

## 2012-08-22 MED ORDER — HYDROMORPHONE HCL PF 2 MG/ML IJ SOLN
2.0000 mg | INTRAMUSCULAR | Status: DC | PRN
  Administered 2012-08-23 (×2): 2 mg via INTRAVENOUS
  Filled 2012-08-22: qty 1
  Filled 2012-08-22: qty 2

## 2012-08-22 MED ORDER — HYDROMORPHONE HCL 4 MG PO TABS
8.0000 mg | ORAL_TABLET | ORAL | Status: DC
  Administered 2012-08-22 – 2012-08-23 (×5): 8 mg via ORAL
  Filled 2012-08-22 (×6): qty 2

## 2012-08-22 NOTE — Progress Notes (Addendum)
Pt ambulated several times up/down entire hall. O2 sats on RA during ambulation stayed between 98 & 100% and pulse stayed between 110 &115. Notified MD.

## 2012-08-22 NOTE — Progress Notes (Signed)
SICKLE CELL SERVICE PROGRESS NOTE  William Jennings NWG:[id number] DOB: 02-26-1991 DOA: 08/13/2012 PCP: August Saucer ERIC, MD  Assessment/Plan: Active Problems:   Sickle cell crisis: Pt reports improvement in pain with current regimen with his pain being maintained at a level of 5-6/10. There was some concern about tachycardia occurring with the patient with activity and as he was moved to telemetry unit. The patient does report in the movement he had very little sleep overnight and was particularly tired today. In light of the patient's decrease in pain I will transition him over to a predominantly oral regimen with a small dose of IV analgesics for breakthrough pain. We'll discontinue the PCA. If the patient does well then I anticipate he will be discharged home tomorrow. Although the patient has a very low fetal hemoglobin hydroxyurea is on hold at this time because of possible adverse effects. A  Muscle Spasm: This appears to be mostly resolved and the patient has not required Flexeril for several days.  Numbness RUE: Numbness completely resolved and the left upper extremity. I feel the numbness is likely associated with muscle spasm.   Rash and other nonspecific skin eruption:  The maculopapular rash is completely resolved at this time. The patient did receive one dose of prednisone which may have contributed to his resolution. The hydroxyurea was discontinued and I'm unsure as to whether or not this is the cause of her there's some other drug interaction. At this time I will continue to hold in the hydroxyurea. I would recommend that when the rash is completely resolved a challenge with the hydroxyurea skin in the outpatient setting to assess for side effects.  Code Status: Full Code Family Communication: N/A Disposition Plan: Home in 48 to 72 hours  Shontel Santee A.  Pager [phone]. If 7PM-7AM, please contact night-coverage.  08/22/2012, 5:27 PM  LOS: 9 days   Brief narrative: 22 year old  African American male with history of sickle cell disease who presented to the ED with symptoms of worsened bilateral knee and leg pains and back pain for past 2 days. He should was admitted to the hospital one month back with sickle cell crisis and discharged home. He informs that since then he hasn't fully recovered with the symptoms and has bilateral leg pains especially in his knees and appear to be swollen by the end of the day. He has difficulty ambulating and even getting on his feet because of the pain several times during the day. He denies any chest pain, palpitations, shortness of breath, headache, fever, chills, dizziness, abdominal pain, nausea, vomiting, denies bowel or urinary symptoms. Denies any trauma. He informs taking Vicodin and ibuprofen at home which has not relieved his symptoms. His symptoms have got worse over past 2 days and is similar to sickle cell crisis. In addition to this he has started developing a papular rash over his chest abdomen and the back since past 4 days. Denies eating anything outside or using new clothes or Linen. Denies any new medications. Denies having any pets at home.  In the ED patient was given 6 mg of morphine IV x2 followed by 1 mg on IV Dilaudid which slightly improved his symptoms. He was also given a dose of IV Benadryl for his itching. Patient informs taking Benadryl at home for his itching which really has not improved his symptoms.  Absent unremarkable. Chest x-ray was normal as well. [facility] hospitalist called to admit patient for sickle cell crisis.   Consultants:  None  Procedures:  None  Antibiotics:  None  HPI/Subjective: Pt states that pain has improved considerably. He has no further numbness in the arms and the spasm in the upper shoulder muscles has decreased also. The patient is tolerating diet well and had a bowel movement within the last 24 hours.  Objective: Filed Vitals:   08/22/12 0800 08/22/12 1200 08/22/12 1307 08/22/12  1341  BP:    122/61  Pulse:    95  Temp:    98 F (36.7 C)  TempSrc:    Oral  Resp: 13 14 15 14   Height:      Weight:      SpO2: 98% 100% 100% 100%   Weight change:   Intake/Output Summary (Last 24 hours) at 08/22/12 1727 Last data filed at 08/22/12 1048  Gross per 24 hour  Intake    222 ml  Output    700 ml  Net   -478 ml    General: Alert, awake, oriented x3, in no acute distress.  HEENT: /AT PEERL, EOMI, Anicteric Heart: Regular rate and rhythm, without murmurs, rubs, gallops.  Lungs: Clear to auscultation, no wheezing or rhonchi noted.  Abdomen: Soft, nontender, nondistended, positive bowel  Skin: Patient has some areas of hyperpigmentation along the anterior and posterior trunk where the rash has resolved.   Neuro: No focal neurological deficits noted cranial nerves II through XII grossly intact.Strength normal in bilateral upper and lower extremities. Grip strength equal in BUE's. No Ulnar drift noted on examination. Musculoskeletal: No warm swelling or erythema around joints.   Data Reviewed: Basic Metabolic Panel:  Recent Labs Lab 08/16/12 0500 08/22/12 0510  NA 135 129*  K 4.1 4.6  CL 98 93*  CO2 30 25  GLUCOSE 108* 160*  BUN 8 18  CREATININE 0.78 0.64  CALCIUM 9.1 9.7   Liver Function Tests:  Recent Labs Lab 08/16/12 0500 08/22/12 0510  AST 48* 50*  ALT 43 43  ALKPHOS 157* 152*  BILITOT 1.3* 1.5*  PROT 7.9 10.1*  ALBUMIN 3.1* 3.6   No results found for this basename: LIPASE, AMYLASE,  in the last 168 hours No results found for this basename: AMMONIA,  in the last 168 hours CBC:  Recent Labs Lab 08/18/12 0459 08/19/12 0436 08/20/12 1302 08/21/12 0430 08/22/12 0510  WBC 9.1 13.0* 16.5* 13.5* 10.5  NEUTROABS 4.0  --   --   --   --   HGB 10.7* 11.6* 11.2* 10.0* 9.6*  HCT 29.3* 31.6* 30.4* 28.3* 26.3*  MCV 72.9* 73.5* 72.7* 72.4* 71.1*  PLT 85* 102* 111* 130* 161   Cardiac Enzymes: No results found for this basename: CKTOTAL, CKMB,  CKMBINDEX, TROPONINI,  in the last 168 hours BNP (last 3 results) No results found for this basename: PROBNP,  in the last 8760 hours CBG: No results found for this basename: GLUCAP,  in the last 168 hours     Studies: Dg Chest 2 View  08/13/2012  *RADIOLOGY REPORT*  Clinical Data: Sickle cell pain, crisis.  CHEST - 2 VIEW  Comparison: 08/10/2012  Findings: Heart is normal size.  Lungs are clear.  No effusions or acute bony abnormality.  IMPRESSION: No acute cardiopulmonary disease.   Original Report Authenticated By: Charlett Nose, M.D.    Dg Chest 2 View  08/10/2012  *RADIOLOGY REPORT*  Clinical Data: Sickle cell pain.  Joint pain.  CHEST - 2 VIEW  Comparison: 06/28/2012  Findings: Thoracic verbal endplate changes of sickle cell disease.  Midline trachea.  Mild convex right thoracic  spine curvature. Normal heart size and mediastinal contours. No pleural effusion or pneumothorax.  Clear lungs.  IMPRESSION: No acute cardiopulmonary disease.  Osseous findings of sickle cell disease.   Original Report Authenticated By: Jeronimo Greaves, M.D.    Dg Knee 1-2 Views Left  08/13/2012  *RADIOLOGY REPORT*  Clinical Data: Left knee pain  LEFT KNEE - 1-2 VIEW  Comparison: None.  Findings: No fracture or dislocation is seen.  The joint spaces are preserved.  The visualized soft tissues are unremarkable.  No suprapatellar knee joint effusion.  IMPRESSION: No acute osseous abnormality is seen.   Original Report Authenticated By: Charline Bills, M.D.    Ct Head Wo Contrast  08/14/2012  *RADIOLOGY REPORT*  Clinical Data: Weakness and hand numbness.  CT HEAD WITHOUT CONTRAST  Technique:  Contiguous axial images were obtained from the base of the skull through the vertex without contrast.  Comparison: None.  Findings: The brain demonstrates no evidence of hemorrhage, infarction, edema, mass effect, extra-axial fluid collection, hydrocephalus or mass lesion.  The skull is unremarkable.  IMPRESSION: Normal head CT.    Original Report Authenticated By: Irish Lack, M.D.    Mr Brain Wo Contrast  08/14/2012  *RADIOLOGY REPORT*  Clinical Data: Sickle cell crisis.  Numbness and pain in the left hand.  MRI HEAD WITHOUT CONTRAST  Technique:  Multiplanar, multiecho pulse sequences of the brain and surrounding structures were obtained according to standard protocol without intravenous contrast.  Comparison: Head CT 08/14/2012  Findings: The brain has a normal appearance on all pulse sequences without evidence of malformation, atrophy, old or acute infarction, mass lesion, hemorrhage, hydrocephalus or extra-axial collection. No pituitary mass.  Sinuses are clear.  Marrow spaces show a hypercellular pattern consistent with the patient's sickle cell diagnosis.  IMPRESSION: Normal appearance of the brain.  No cause of the patient's described symptoms is identified.   Original Report Authenticated By: Paulina Fusi, M.D.     Scheduled Meds: . enoxaparin (LOVENOX) injection  40 mg Subcutaneous Q24H  . folic acid  1 mg Oral Daily  . HYDROmorphone  8 mg Oral Q4H  . loratadine  10 mg Oral Daily  . senna-docusate  1 tablet Oral BID   Continuous Infusions: . sodium chloride 75 mL/hr at 08/22/12 0421                A

## 2012-08-23 MED ORDER — HYDROMORPHONE HCL 8 MG PO TABS: 4.0000 mg | ORAL_TABLET | Freq: Four times a day (QID) | ORAL | Status: DC

## 2012-08-23 MED ORDER — SENNOSIDES-DOCUSATE SODIUM 8.6-50 MG PO TABS: 1.0000 | ORAL_TABLET | Freq: Two times a day (BID) | ORAL | Status: DC

## 2012-08-23 NOTE — Discharge Summary (Signed)
Discharge Summary  CAULDER WEHNER MR#: [id number]  DOB:[birth year]  Date of Admission: 08/13/2012 Date of Discharge: 08/23/2012  Patient's PCP: Dr. Marthann Jennings   Attending Physician:William Jennings  Consults:   Sickle cell  Discharge Diagnoses: Active Problems:   Sickle cell crisis   Leukocytosis   Rash and other nonspecific skin eruption   Brief Admitting  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  He stated he has had difficulty ambulating and even getting on his feet because of the pain several times during the day. He denies any chest pain, palpitations, shortness of breath, headache, fever, chills, dizziness, abdominal pain, nausea, vomiting, denies bowel or urinary symptoms.  He doesn't take that much pain medication his home regiment is informs Vicodin and ibuprofen. Previously would provide relief. This time he had not relief from his symptoms. He felt this was  symptoms where similar to sickle cell crisis and proceeded to ED for further evaluation and treatment. William Jennings also reported developing a papular rash over his chest abdomen and the back.   Discharge Medications   Medication List    ASK your doctor about these medications       folic acid 1 MG tablet  Commonly known as:  FOLVITE  Take 1 tablet (1 mg total) by mouth daily.     ibuprofen 800 MG tablet  Commonly known as:  ADVIL,MOTRIN  Take 800 mg by mouth every 8 (eight) hours as needed for pain.     oxyCODONE 5 MG immediate release tablet  Commonly known as:  Oxy IR/ROXICODONE  Take 1 tablet (5 mg total) by mouth every 6 (six) hours as needed for pain.     sulfamethoxazole-trimethoprim 800-160 MG per tablet  Commonly known as:  BACTRIM DS,SEPTRA DS  Take 1 tablet by mouth 2 (two) times daily.        Hospital Course:    Sickle cell crisis with pain. -  He was initially treated with hydration,  IV hydromorphone 2mg  Q 2hrs fo pain and inflammatory process (elevated CRP)  IV Toradol 30 mg Q 6hrs. This was ineffective with controlling his pain and was placed on PCA hydromorphone and IV boluses for break thru. After 24hrs of review of usage of PCA hydromorphone he was transition to oral and IV for break through pain.  Numbness LUE -William Jennings on 08/14/12 had a new  concerned numbness and transitient weakness. In the context of sickle cell disease this raised concerns for a CVA . Dr. Thad Jennings neurology was consulted and reccommended observation. As clinical course progressed it was felt that the s/s were secondary to spasms.   UTI:  He was placed on abt's Bactrim from a urgent care center. The  U/A was repeated and  results where negative and medication discontinued  Leucocytosis: secondary to inflammatory/reactive  process WBC trending down and was treated with IV Toradol for 5 days   Rash and other nonspecific skin eruption unknown origin Hydrourea discontinue unclear if causing or contributing to rash.  Constipation: BM last night secondary to narcotic use senokot and miralax given    Present on Admission:  . Sickle cell crisis . Rash and other nonspecific skin eruption . Leukocytosis    Day of Discharge BP 103/63  Pulse 91  Temp(Src) 97.7 F (36.5 C) (Oral)  Resp 16  Ht 5\' 8"  (  1.727 m)  Wt 58.469 kg (128 lb 14.4 oz)  BMI 19.6 kg/m2  SpO2 98%  Results for orders placed during the hospital encounter of 08/13/12 (from the past 48 hour(s))  CBC     Status: Abnormal   Collection Time    08/22/12  5:10 AM      Result Value Range   WBC 10.5  4.0 - 10.5 K/uL   Comment: WHITE COUNT CONFIRMED ON SMEAR   RBC 3.70 (*) 4.22 - 5.81 MIL/uL   Hemoglobin 9.6 (*) 13.0 - 17.0 g/dL   HCT 82.9 (*) 56.2 - 13.0 %   MCV 71.1 (*) 78.0 - 100.0 fL   MCH 25.9 (*) 26.0 - 34.0 pg   MCHC 36.5 (*) 30.0 - 36.0 g/dL   RDW 86.5 (*) 78.4 - 69.6 %   Platelets 161  150  - 400 K/uL   Comment: SPECIMEN CHECKED FOR CLOTS     LARGE PLATELETS PRESENT     PLATELET COUNT CONFIRMED BY SMEAR  COMPREHENSIVE METABOLIC PANEL     Status: Abnormal   Collection Time    08/22/12  5:10 AM      Result Value Range   Sodium 129 (*) 135 - 145 mEq/L   Potassium 4.6  3.5 - 5.1 mEq/L   Chloride 93 (*) 96 - 112 mEq/L   CO2 25  19 - 32 mEq/L   Glucose, Bld 160 (*) 70 - 99 mg/dL   BUN 18  6 - 23 mg/dL   Creatinine, Ser 2.95  0.50 - 1.35 mg/dL   Calcium 9.7  8.4 - 28.4 mg/dL   Total Protein 13.2 (*) 6.0 - 8.3 g/dL   Albumin 3.6  3.5 - 5.2 g/dL   AST 50 (*) 0 - 37 U/L   ALT 43  0 - 53 U/L   Alkaline Phosphatase 152 (*) 39 - 117 U/L   Total Bilirubin 1.5 (*) 0.3 - 1.2 mg/dL   GFR calc non Af Amer >90  >90 mL/min   GFR calc Af Amer >90  >90 mL/min   Comment:            The eGFR has been calculated     using the CKD EPI equation.     This calculation has not been     validated in all clinical     situations.     eGFR's persistently     <90 mL/min signify     possible Chronic Kidney Disease.    Dg Chest 2 View  08/13/2012  *RADIOLOGY REPORT*  Clinical Data: Sickle cell pain, crisis.  CHEST - 2 VIEW  Comparison: 08/10/2012  Findings: Heart is normal size.  Lungs are clear.  No effusions or acute bony abnormality.  IMPRESSION: No acute cardiopulmonary disease.   Original Report Authenticated By: Charlett Nose, M.D.    Dg Chest 2 View  08/10/2012  *RADIOLOGY REPORT*  Clinical Data: Sickle cell pain.  Joint pain.  CHEST - 2 VIEW  Comparison: 06/28/2012  Findings: Thoracic verbal endplate changes of sickle cell disease.  Midline trachea.  Mild convex right thoracic spine curvature. Normal heart size and mediastinal contours. No pleural effusion or pneumothorax.  Clear lungs.  IMPRESSION: No acute cardiopulmonary disease.  Osseous findings of sickle cell disease.   Original Report Authenticated By: Jeronimo Greaves, M.D.    Dg Cervical Spine 2 Or 3 Views  08/20/2012  *RADIOLOGY REPORT*   Clinical Data: Posterior neck pain and bilateral hand numbness for 4 days the history  of sickle cell disease  CERVICAL SPINE - 2-3 VIEW  Comparison: Chest radiograph - 08/13/2012; 08/10/2012  Findings:  The C1 to the superior endplate of T1 is imaged on the provided lateral radiograph.  Normal alignment of the cervical spine.  No anterolisthesis or retrolisthesis.  Cervical vertebral body heights are preserved.  Stigmata of sickle cell disease at the C7 - and T1 intervertebral disc space with "fish mouth" deformity.  The remaining intervertebral disc spaces are preserved and normal in appearance.  Regional soft tissues are normal.  Limited visualization of the superior thorax suggests borderline enlargement of the cardiac silhouette.  IMPRESSION: 1.  No explanation for patient's posterior neck pain.  Further evaluation with cervical spine MRI may be performed as clinically indicated. 2.  Stigmata of sickle cell disease at the cervical thoracic junction.  Intervertebral disc spaces are otherwise normal.   Original Report Authenticated By: Tacey Ruiz, MD    Dg Knee 1-2 Views Left  08/13/2012  *RADIOLOGY REPORT*  Clinical Data: Left knee pain  LEFT KNEE - 1-2 VIEW  Comparison: None.  Findings: No fracture or dislocation is seen.  The joint spaces are preserved.  The visualized soft tissues are unremarkable.  No suprapatellar knee joint effusion.  IMPRESSION: No acute osseous abnormality is seen.   Original Report Authenticated By: Charline Bills, M.D.    Ct Head Wo Contrast  08/14/2012  *RADIOLOGY REPORT*  Clinical Data: Weakness and hand numbness.  CT HEAD WITHOUT CONTRAST  Technique:  Contiguous axial images were obtained from the base of the skull through the vertex without contrast.  Comparison: None.  Findings: The brain demonstrates no evidence of hemorrhage, infarction, edema, mass effect, extra-axial fluid collection, hydrocephalus or mass lesion.  The skull is unremarkable.  IMPRESSION: Normal head CT.    Original Report Authenticated By: Irish Lack, M.D.    Mr Brain Wo Contrast  08/14/2012  *RADIOLOGY REPORT*  Clinical Data: Sickle cell crisis.  Numbness and pain in the left hand.  MRI HEAD WITHOUT CONTRAST  Technique:  Multiplanar, multiecho pulse sequences of the brain and surrounding structures were obtained according to standard protocol without intravenous contrast.  Comparison: Head CT 08/14/2012  Findings: The brain has a normal appearance on all pulse sequences without evidence of malformation, atrophy, old or acute infarction, mass lesion, hemorrhage, hydrocephalus or extra-axial collection. No pituitary mass.  Sinuses are clear.  Marrow spaces show a hypercellular pattern consistent with the patient's sickle cell diagnosis.  IMPRESSION: Normal appearance of the brain.  No cause of the patient's described symptoms is identified.   Original Report Authenticated By: Paulina Fusi, M.D.      Disposition: Home self care   Diet: Regular  Activity: as tolerated    Follow-up Appts: Hospital follow up scheduled with Dr.Mattews and establish care on September 06, 2012 at 1pm.   TESTS THAT NEED FOLLOW-UP None   Time spent on discharge, talking to the patient, and coordinating care: 60 mins.   Signed: Monita Swier Jennings 08/23/2012, 10:56 AM

## 2012-08-23 NOTE — Progress Notes (Signed)
Patient instructed and accessed "My Chart" before discharge.

## 2012-09-13 NOTE — Discharge Summary (Signed)
Mr. Eschmann was admitted with acute vaso-occlusive episode. His crisis was prolonged secondary to extreme swings in barometric pressure which led to a worsening of hemolysis and prolonged hospital stay. He was treated initially with bolus doses of analgesics then transitioned to PCA. His pain was effectively managed and he was transitioned to oral medications. Pt is being discharged in good condition on oral regimen as described.   Pt also has a rash which was present pre-hospital. He was evaluated in the ED and given an antibiotic which he di not pick up from the Pharmacy.    Anyhow, at the time of admission the rash has almost resolved. The rash worsened and the only temporal correlate was the initiation of Hydrea. The patient does not remember ever having a rash with the use of Hydrea  In the past. The patient received a dose of steroids and the rash responded with improvement. The Hydrea was discontinued and I recommend restarting in the ambulatory setting.  Pt seen and examined and discussed with NP Gwinda Passe.

## 2012-10-02 ENCOUNTER — Encounter: Payer: Self-pay | Admitting: Occupational Therapy

## 2013-01-20 ENCOUNTER — Emergency Department (HOSPITAL_BASED_OUTPATIENT_CLINIC_OR_DEPARTMENT_OTHER)
Admission: EM | Admit: 2013-01-20 | Discharge: 2013-01-20 | Disposition: A | Discharge status: Home / Self Care | Payer: Self-pay | Attending: Emergency Medicine | Admitting: Emergency Medicine

## 2013-01-20 DIAGNOSIS — M25519 Pain in unspecified shoulder: Secondary | ICD-10-CM | POA: Insufficient documentation

## 2013-01-20 DIAGNOSIS — Z79899 Other long term (current) drug therapy: Secondary | ICD-10-CM | POA: Insufficient documentation

## 2013-01-20 DIAGNOSIS — M549 Dorsalgia, unspecified: Secondary | ICD-10-CM | POA: Insufficient documentation

## 2013-01-20 DIAGNOSIS — D5700 Hb-SS disease with crisis, unspecified: Secondary | ICD-10-CM | POA: Insufficient documentation

## 2013-01-20 DIAGNOSIS — Z87891 Personal history of nicotine dependence: Secondary | ICD-10-CM | POA: Insufficient documentation

## 2013-01-20 LAB — CBC WITH DIFFERENTIAL/PLATELET
Basophils Absolute: 0 10*3/uL (ref 0.0–0.1)
Eosinophils Absolute: 0.2 10*3/uL (ref 0.0–0.7)
Lymphocytes Relative: 53 % — ABNORMAL HIGH (ref 12–46)
MCH: 28.4 pg (ref 26.0–34.0)
MCHC: 36.9 g/dL — ABNORMAL HIGH (ref 30.0–36.0)
Monocytes Absolute: 1.2 10*3/uL — ABNORMAL HIGH (ref 0.1–1.0)
Neutrophils Relative %: 31 % — ABNORMAL LOW (ref 43–77)
Platelets: 180 10*3/uL (ref 150–400)
RDW: 14 % (ref 11.5–15.5)

## 2013-01-20 MED ORDER — SODIUM CHLORIDE 0.9 % IV BOLUS (SEPSIS)
500.0000 mL | Freq: Once | INTRAVENOUS | Status: AC
  Administered 2013-01-20: 500 mL via INTRAVENOUS

## 2013-01-20 MED ORDER — DIPHENHYDRAMINE HCL 25 MG PO CAPS
25.0000 mg | ORAL_CAPSULE | Freq: Once | ORAL | Status: AC
  Administered 2013-01-20: 25 mg via ORAL
  Filled 2013-01-20: qty 1

## 2013-01-20 MED ORDER — HYDROMORPHONE HCL PF 1 MG/ML IJ SOLN
1.0000 mg | Freq: Once | INTRAMUSCULAR | Status: AC
  Administered 2013-01-20: 1 mg via INTRAVENOUS
  Filled 2013-01-20: qty 1

## 2013-01-20 MED ORDER — ACETAMINOPHEN 325 MG PO TABS
650.0000 mg | ORAL_TABLET | Freq: Once | ORAL | Status: DC
  Filled 2013-01-20: qty 2

## 2013-01-20 MED ORDER — KETOROLAC TROMETHAMINE 30 MG/ML IJ SOLN
30.0000 mg | Freq: Once | INTRAMUSCULAR | Status: AC
  Administered 2013-01-20: 30 mg via INTRAVENOUS
  Filled 2013-01-20: qty 1

## 2013-01-20 NOTE — ED Provider Notes (Signed)
CSN: [id number]     Arrival date & time 01/20/13  2116 History     First MD Initiated Contact with Patient 01/20/13 2214     Chief Complaint  Patient presents with  . Sickle Cell Pain Crisis   (Consider location/radiation/quality/duration/timing/severity/associated sxs/prior Treatment) HPI Comments: 22 yo male with sickle cell anemia, acute chest syndrome in the past presents with right shoulder/ arm pain for two days, similar to previous attacks. No chest pain, cough or sob. Pain meds at home mild improvement.  Constant ache.   Patient is a 22 y.o. male presenting with sickle cell pain. The history is provided by the patient.  Sickle Cell Pain Crisis Location:  R side Associated symptoms: no chest pain, no fever, no headaches, no shortness of breath and no vomiting     Past Medical History  Diagnosis Date  . Sickle cell disease    Past Surgical History  Procedure Laterality Date  . Cholecystectomy     No family history on file. History  Substance Use Topics  . Smoking status: Former Games developer  . Smokeless tobacco: Never Used  . Alcohol Use: No    Review of Systems  Constitutional: Negative for fever and chills.  HENT: Negative for neck pain and neck stiffness.   Eyes: Negative for visual disturbance.  Respiratory: Negative for shortness of breath.   Cardiovascular: Negative for chest pain.  Gastrointestinal: Negative for vomiting and abdominal pain.  Genitourinary: Negative for dysuria and flank pain.  Musculoskeletal: Positive for back pain and arthralgias.  Skin: Negative for rash.  Neurological: Negative for light-headedness and headaches.    Allergies  Shellfish allergy  Home Medications   Current Outpatient Rx  Name  Route  Sig  Dispense  Refill  . HYDROcodone-acetaminophen (NORCO) 10-325 MG per tablet   Oral   Take 1 tablet by mouth every 6 (six) hours as needed for pain.         . folic acid (FOLVITE) 1 MG tablet   Oral   Take 1 tablet (1 mg total)  by mouth daily.   30 tablet   0   . HYDROmorphone (DILAUDID) 8 MG tablet   Oral   Take 0.5 tablets (4 mg total) by mouth every 6 (six) hours.   60 tablet   0     Take 8mg  every 4 hours for 2 days (08/23/12-08/24/12 ...   . ibuprofen (ADVIL,MOTRIN) 800 MG tablet   Oral   Take 800 mg by mouth every 8 (eight) hours as needed for pain.         Marland Kitchen senna-docusate (SENOKOT-S) 8.6-50 MG per tablet   Oral   Take 1 tablet by mouth 2 (two) times daily.          BP 134/69  Pulse 57  Temp(Src) 98.4 F (36.9 C) (Oral)  SpO2 97% Physical Exam  Nursing note and vitals reviewed. Constitutional: He is oriented to person, place, and time. He appears well-developed and well-nourished.  HENT:  Head: Normocephalic and atraumatic.  Eyes: Conjunctivae are normal. Right eye exhibits no discharge. Left eye exhibits no discharge.  Neck: Normal range of motion. Neck supple. No tracheal deviation present.  Cardiovascular: Normal rate and regular rhythm.   Pulmonary/Chest: Effort normal and breath sounds normal.  Abdominal: Soft. He exhibits no distension. There is no tenderness. There is no guarding.  Musculoskeletal: He exhibits tenderness (right shoulder and humerus with palpation and rom, no warmth or swelling, nv intact distal, soft compartments). He exhibits no edema.  Neurological: He is alert and oriented to person, place, and time.  Skin: Skin is warm. No rash noted.  Psychiatric: He has a normal mood and affect.    ED Course   Procedures (including critical care time)  Labs Reviewed  CBC WITH DIFFERENTIAL - Abnormal; Notable for the following:    Hemoglobin 12.1 (*)    HCT 32.8 (*)    MCV 77.0 (*)    MCHC 36.9 (*)    Neutrophils Relative % 31 (*)    Lymphocytes Relative 53 (*)    Monocytes Relative 14 (*)    Lymphs Abs 4.7 (*)    Monocytes Absolute 1.2 (*)    All other components within normal limits   No results found. 1. Sickle cell pain crisis     MDM  Similar to  previous. Well appearing. Pain improved significantly in ED.  Sleeping comfortably.   Fup discussed.   Enid Skeens, MD 01/20/13 2352

## 2013-01-20 NOTE — ED Notes (Signed)
Pt states sickle cell crisis, c/o pain right arm started yesterday and has continued throughout day, has taken ibuprofen, hydrocodone and tylenol with codeine

## 2013-01-20 NOTE — Discharge Instructions (Signed)
If you were given medicines take as directed.  If you are on coumadin or contraceptives realize their levels and effectiveness is altered by many different medicines.  If you have any reaction (rash, tongues swelling, other) to the medicines stop taking and see a physician.   °Please follow up as directed and return to the ER or see a physician for new or worsening symptoms.  Thank you. ° ° °

## 2013-01-22 ENCOUNTER — Non-Acute Institutional Stay (HOSPITAL_COMMUNITY)
Admission: AD | Admit: 2013-01-22 | Discharge: 2013-01-22 | Disposition: A | Discharge status: Home / Self Care | Payer: BC Managed Care – PPO | Source: Ambulatory Visit | Attending: Internal Medicine | Admitting: Internal Medicine

## 2013-01-22 ENCOUNTER — Ambulatory Visit (INDEPENDENT_AMBULATORY_CARE_PROVIDER_SITE_OTHER): Payer: Medicaid Other | Admitting: Internal Medicine

## 2013-01-22 ENCOUNTER — Encounter (HOSPITAL_COMMUNITY): Payer: Self-pay | Admitting: Hematology

## 2013-01-22 ENCOUNTER — Encounter: Payer: Self-pay | Admitting: Internal Medicine

## 2013-01-22 VITALS — BP 124/90 | HR 98 | Temp 98.5°F | Resp 16 | Ht 67.5 in | Wt 141.0 lb

## 2013-01-22 DIAGNOSIS — Z79899 Other long term (current) drug therapy: Secondary | ICD-10-CM | POA: Insufficient documentation

## 2013-01-22 DIAGNOSIS — M79609 Pain in unspecified limb: Secondary | ICD-10-CM | POA: Insufficient documentation

## 2013-01-22 DIAGNOSIS — H9190 Unspecified hearing loss, unspecified ear: Secondary | ICD-10-CM | POA: Insufficient documentation

## 2013-01-22 DIAGNOSIS — G8929 Other chronic pain: Secondary | ICD-10-CM | POA: Insufficient documentation

## 2013-01-22 DIAGNOSIS — D57219 Sickle-cell/Hb-C disease with crisis, unspecified: Secondary | ICD-10-CM

## 2013-01-22 DIAGNOSIS — H9192 Unspecified hearing loss, left ear: Secondary | ICD-10-CM

## 2013-01-22 DIAGNOSIS — D57819 Other sickle-cell disorders with crisis, unspecified: Secondary | ICD-10-CM | POA: Insufficient documentation

## 2013-01-22 DIAGNOSIS — R52 Pain, unspecified: Secondary | ICD-10-CM | POA: Insufficient documentation

## 2013-01-22 LAB — CBC WITH DIFFERENTIAL/PLATELET
Basophils Absolute: 0 10*3/uL (ref 0.0–0.1)
Eosinophils Absolute: 0 10*3/uL (ref 0.0–0.7)
Eosinophils Relative: 0 % (ref 0–5)
Lymphocytes Relative: 37 % (ref 12–46)
Lymphs Abs: 3.2 10*3/uL (ref 0.7–4.0)
MCH: 28.4 pg (ref 26.0–34.0)
Neutrophils Relative %: 54 % (ref 43–77)
Platelets: 158 10*3/uL (ref 150–400)
RBC: 4.65 MIL/uL (ref 4.22–5.81)
RDW: 15 % (ref 11.5–15.5)
WBC: 8.7 10*3/uL (ref 4.0–10.5)

## 2013-01-22 LAB — RETICULOCYTES
RBC.: 4.65 MIL/uL (ref 4.22–5.81)
Retic Ct Pct: 5.8 % — ABNORMAL HIGH (ref 0.4–3.1)

## 2013-01-22 LAB — COMPREHENSIVE METABOLIC PANEL
ALT: 23 U/L (ref 0–53)
AST: 57 U/L — ABNORMAL HIGH (ref 0–37)
Albumin: 4.6 g/dL (ref 3.5–5.2)
Calcium: 9.7 mg/dL (ref 8.4–10.5)
GFR calc Af Amer: >90 mL/min (ref >90)
Glucose, Bld: 100 mg/dL — ABNORMAL HIGH (ref 70–99)
Sodium: 134 mEq/L — ABNORMAL LOW (ref 135–145)
Total Protein: 8.2 g/dL (ref 6.0–8.3)

## 2013-01-22 MED ORDER — HYDROCODONE-ACETAMINOPHEN 5-325 MG PO TABS: 1.0000 | ORAL_TABLET | Freq: Four times a day (QID) | ORAL | Status: DC | PRN

## 2013-01-22 MED ORDER — HYDROMORPHONE HCL PF 2 MG/ML IJ SOLN
1.0000 mg | Freq: Once | INTRAMUSCULAR | Status: AC
  Administered 2013-01-22: 1 mg via INTRAVENOUS
  Filled 2013-01-22: qty 1

## 2013-01-22 MED ORDER — HYDROMORPHONE HCL PF 2 MG/ML IJ SOLN
1.0000 mg | INTRAMUSCULAR | Status: DC | PRN
  Administered 2013-01-22: 2 mg via INTRAVENOUS
  Administered 2013-01-22: 1 mg via INTRAVENOUS
  Filled 2013-01-22 (×2): qty 1

## 2013-01-22 MED ORDER — MORPHINE SULFATE ER 15 MG PO TBCR: 15.0000 mg | EXTENDED_RELEASE_TABLET | Freq: Two times a day (BID) | ORAL | Status: DC

## 2013-01-22 MED ORDER — KETOROLAC TROMETHAMINE 30 MG/ML IJ SOLN
30.0000 mg | Freq: Once | INTRAMUSCULAR | Status: AC
  Administered 2013-01-22: 30 mg via INTRAVENOUS
  Filled 2013-01-22: qty 1

## 2013-01-22 MED ORDER — FOLIC ACID 1 MG PO TABS
1.0000 mg | ORAL_TABLET | Freq: Every day | ORAL | Status: DC
  Administered 2013-01-22: 1 mg via ORAL
  Filled 2013-01-22: qty 1

## 2013-01-22 MED ORDER — DEXTROSE-NACL 5-0.45 % IV SOLN
INTRAVENOUS | Status: DC
  Administered 2013-01-22: 13:00:00 via INTRAVENOUS

## 2013-01-22 NOTE — Progress Notes (Signed)
Patient ID: William Jennings, male   DOB: 1990/11/21, 23 y.o.   MRN: [id number] Discharge instructions given to patient, along with follow up information.  IV removed without difficulty.  Questions answered.  Patient in no acute distress at this time.  Asking to be discharged so that he can pick up his girlfriend from work.  Patient states "I feel much better now then when I first got here."

## 2013-01-22 NOTE — Progress Notes (Signed)
  Subjective:    Patient ID: William Jennings, male    DOB: Jul 20, 1990, 22 y.o.   MRN: [id number]  Sickle Cell Pain Crisis Associated symptoms include arthralgias and myalgias (Left arm).   Pt with Hb [place] known to me only from hospitalizations is here today to establish care and with complaints of pain in RUE. Patient reports pain at 7/10 presently and localized to Right arm. He describes the pain as throbbing in nature and non radiating. He usually takes Ibuprofen an a daily basis and occasionally has to take Hydrocodone/APAP 10/325. He took the last Hydrocodone/APA on Friday (3 days ago).    Pt is relatively opiate naive but has tolerated up to Dilaudid 2 mg without any respiratory compromise. He denies any history of substance abuse within his family.  Pt also complains of hearing loss in his left ear which began more than 1 year ago. He states that he did not report it to anyone as he had no means by which to address the problem. He states that the hearing loss has not worsened, but he is unable to hear sounds in the right ear.  Review of Systems  Constitutional: Negative.   HENT: Positive for hearing loss (Left ear).   Eyes: Negative.   Respiratory: Negative.   Cardiovascular: Negative.   Gastrointestinal: Negative.   Endocrine: Negative.   Genitourinary: Negative.   Musculoskeletal: Positive for myalgias (Left arm) and arthralgias.  Skin: Negative.   Allergic/Immunologic: Negative.   Neurological: Negative.   Hematological: Negative.   Psychiatric/Behavioral: Negative.        Objective:   Physical Exam  Constitutional: He is oriented to person, place, and time. He appears well-developed and well-nourished. No distress.  HENT:  Head: Atraumatic.  Right Ear: External ear normal.  Left Ear: External ear normal.  Eyes: Conjunctivae and EOM are normal. Pupils are equal, round, and reactive to light. No scleral icterus.  Neck: Normal range of motion. Neck supple. No JVD present.   Cardiovascular: Normal rate and regular rhythm.  Exam reveals no gallop and no friction rub.   No murmur heard. Pulmonary/Chest: Effort normal and breath sounds normal. He has no wheezes. He has no rales. He exhibits no tenderness.  Abdominal: Soft. Bowel sounds are normal. He exhibits no mass.  Musculoskeletal: Normal range of motion.  Lymphadenopathy:    He has no cervical adenopathy.  Neurological: He is alert and oriented to person, place, and time. A cranial nerve deficit (loss of hearing in left ear on examination) is present.  Skin: Skin is warm and dry.  Psychiatric: He has a normal mood and affect. His behavior is normal. Judgment and thought content normal.          Assessment & Plan:  1. Hb [place] with early vaso-occlusive crisis: Will transfer to Day hospital for hydration and acute analgesic treatment.   Pt had eye examination within the last year. Last ECHO in August of 2011 which showed TRV of 2.45 m/sec.   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 Hearing loss Left Ear: Pt is not currently on any medications that would cause hearing loss. Will refer for audiology evaluation.  RTC: 1 week.

## 2013-01-22 NOTE — Care Management (Signed)
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 William Jennings presented today in [place] pain crisis, stating he just left [facility] ED who sent him to [facility] to be seen. William Jennings advised he does not have a PCP and has been seen by "William Jennings". William Jennings has not established care with [facility] however this CM had William Jennings complete the [facility] new patient packet. He will establish care today with William Jennings.     Barriers to Care: Needed a PCP Prior Approval (PA) #: na PA start date:na PA end date:  na  Action/Plan: This CM provided new patient packet and explained how the process works. Once he establishes care with William Jennings he will then have access to the acute care crisis day hospital.  Comments: William Jennings has coverage with BCBS. This CM verified coverage with [facility] [phone]. Per Waynetta Sandy he has a high deductible plan and has not had any claims on file, as of today's date.   Time spent: 45 mins.  Karoline Caldwell, RN, BSN, [place]     [phone]

## 2013-01-22 NOTE — H&P (Signed)
[facility] History and Physical  William Jennings ZOX:[id number] DOB: [birth year] DOA: 01/22/2013   PCP: Epifanio Labrador A., MD   Chief Complaint: Pain in LUE x 4 days  HPI: Pt with Hb [place] known to me only from hospitalizations was here today to establish care and had complaints of pain in RUE. Patient reports pain at 7/10 presently and localized to Right arm. Pain has been occurring for the last 3-4 days and has been 8-9/10 at it's most intense.He describes the pain as throbbing in nature and non radiating. He usually takes Ibuprofen an a daily basis and occasionally has to take Hydrocodone/APAP 10/325. He took the last Hydrocodone/APA on Friday (3 days ago).   Pt is relatively opiate naive but has tolerated up to Dilaudid 2 mg without any respiratory compromise. He denies any history of substance abuse within his family.    Review of Systems:  Constitutional: No weight loss, night sweats, Fevers, chills, fatigue.  HEENT: Pt complians of hearing loss in left ear. No headaches, dizziness, seizures, vision changes, difficulty swallowing,Tooth/dental problems,Sore throat, No sneezing, itching, ear ache, nasal congestion, post nasal drip,  Cardio-vascular: No chest pain, Orthopnea, PND, swelling in lower extremities, anasarca, dizziness, palpitations  GI: No heartburn, indigestion, abdominal pain, nausea, vomiting, diarrhea, change in bowel habits, loss of appetite  Resp: No shortness of breath with exertion or at rest. No excess mucus, no productive cough, No non-productive cough, No coughing up of blood.No change in color of mucus.No wheezing.No chest wall deformity  Skin: no rash or lesions.  GU: no dysuria, change in color of urine, no urgency or frequency. No flank pain.  Musculoskeletal: No joint pain or swelling. No decreased range of motion. No back pain.  Psych: No change in mood or affect. No depression or anxiety. No memory loss.    Past Medical History  Diagnosis  Date  . Sickle cell disease    Past Surgical History  Procedure Laterality Date  . Cholecystectomy     Social History:  reports that he has quit smoking. He has never used smokeless tobacco. He reports that he does not drink alcohol or use illicit drugs.  Allergies  Allergen Reactions  . Shellfish Allergy Anaphylaxis    No family history on file.  Prior to Admission medications   Medication Sig Start Date End Date Taking? Authorizing Provider  folic acid (FOLVITE) 1 MG tablet Take 1 tablet (1 mg total) by mouth daily. 06/23/12  Yes Altha Harm, MD  HYDROcodone-acetaminophen (NORCO) 10-325 MG per tablet Take 1 tablet by mouth every 6 (six) hours as needed for pain.   Yes Historical Provider, MD  ibuprofen (ADVIL,MOTRIN) 800 MG tablet Take 800 mg by mouth every 8 (eight) hours as needed for pain.   Yes Historical Provider, MD  HYDROmorphone (DILAUDID) 8 MG tablet Take 0.5 tablets (4 mg total) by mouth every 6 (six) hours. 08/23/12   Grayce Sessions, NP  senna-docusate (SENOKOT-S) 8.6-50 MG per tablet Take 1 tablet by mouth 2 (two) times daily. 08/23/12   Grayce Sessions, NP   Physical Exam: Filed Vitals:   01/22/13 1229  BP: 127/81  Pulse: 55  Temp: 98.5 F (36.9 C)  TempSrc: Oral  Resp: 20  Height: 5\' 8"  (1.727 m)  Weight: 145 lb (65.772 kg)  SpO2: 100%   BP 127/81  Pulse 55  Temp(Src) 98.5 F (36.9 C) (Oral)  Resp 20  Ht 5\' 8"  (1.727 m)  Wt 145 lb (65.772 kg)  BMI 22.05 kg/m2  SpO2 100%  General Appearance:    Alert, cooperative, no mild distress, appears stated age  Head:    Normocephalic, without obvious abnormality, atraumatic  Eyes:    PERRL, conjunctiva/corneas clear, EOM's intact, fundi    benign, both eyes       Ears:    Normal TM's and external ear canals, both ears  Neck:   Supple, symmetrical, trachea midline, no adenopathy;       thyroid:  No enlargement/tenderness/nodules; no carotid   bruit or JVD  Back:     Symmetric, no curvature, ROM  normal, no CVA tenderness  Lungs:     Clear to auscultation bilaterally, respirations unlabored  Chest wall:    No tenderness or deformity  Heart:    Regular rate and rhythm, S1 and S2 normal, no murmur, rub   or gallop  Abdomen:     Soft, non-tender, bowel sounds active all four quadrants,    no masses, no organomegaly  Extremities:   Extremities normal, atraumatic, no cyanosis or edema  Pulses:   2+ and symmetric all extremities  Skin:   Skin color, texture, turgor normal, no rashes or lesions  Lymph nodes:   Cervical, supraclavicular, and axillary nodes normal  Neurologic:   CNII-XII intact. Normal strength, sensation and reflexes      throughout    Labs on Admission: Pending CBC with diff, CMET, Mg   Recent Labs Lab 01/20/13 2258  WBC 8.9  NEUTROABS 2.8  HGB 12.1*  HCT 32.8*  MCV 77.0*  PLT 180   Assessment/Plan: Active Problems: 1. Hb  with early vaso-occlusive episode: Will start IV hydration and give a dose of Dilaudid 1 mg and assess response. If necessary increase to 2 mg to affect decrease in pain and treat with Clinician assisted doses on a scheduled basis. Will also add Toradol if renal function normal. IVF D5.45 @100  ml/hr.   2. Hearing loss in Left Ear: Will address as out patient.    Time spend: 40 minutes Code Status: Full Code Family Communication: N/A Disposition Plan:  Home  Shawan Corella A., MD  Pager [phone]  If 7PM-7AM, please contact night-coverage [email or web address] Password [facility] 01/22/2013, 12:41 PM

## 2013-01-23 NOTE — Discharge Summary (Signed)
[facility] Dis[facility]   Patient ID: William Jennings MRN: [id number] DOB/AGE: 09/28/1990 22 y.o.  Admit date: 01/22/2013 Discharge date: 01/23/2013  Primary Care Physician:  William Radich A., MD  Admission Diagnoses:  Active Problems:   Vaso occlusive crisis with pain  Acute on chronic pain    Discharge Diagnoses:    Vaso occlusive crisis with pain  Acute on chronic pain    Discharge Medications:    Medication List    TAKE these medications       morphine 15 MG 12 hr tablet  Commonly known as:  MS CONTIN  Take 1 tablet (15 mg total) by mouth 2 (two) times daily.      ASK your doctor about these medications       folic acid 1 MG tablet  Commonly known as:  FOLVITE  Take 1 tablet (1 mg total) by mouth daily.     HYDROcodone-acetaminophen 5-325 MG per tablet  Commonly known as:  NORCO/VICODIN  Take 1 tablet by mouth every 6 (six) hours as needed for pain.  Ask about: Which instructions should I use?     HYDROcodone-acetaminophen 10-325 MG per tablet  Commonly known as:  NORCO  Take 1 tablet by mouth every 6 (six) hours as needed for pain.  Ask about: Which instructions should I use?     HYDROmorphone 8 MG tablet  Commonly known as:  DILAUDID  Take 0.5 tablets (4 mg total) by mouth every 6 (six) hours.     ibuprofen 800 MG tablet  Commonly known as:  ADVIL,MOTRIN  Take 800 mg by mouth every 8 (eight) hours as needed for pain.     senna-docusate 8.6-50 MG per tablet  Commonly known as:  Senokot-S  Take 1 tablet by mouth 2 (two) times daily.         Consults:  None   Significant Diagnostic Studies:  No results found.   [facility] Course:  For complete details please refer to admission H and P, but in brief, Mr William Jennings is a [place] American male  with Hb [place] SCD whom is opoid naive and doesn't like to take analgesic to manage his pain unless absolute necessary.   PMH. He was initially seen to establish care with  his PCP . He had complaints of pain in 7/10 non radiating. He was admitted to the [facility] for the management of his symptoms. Treatment included: Vaso occlusive pain crisis in the absence of hemolysis: gently hydration of IVF D51/2 NS , hydromorphone 1 mg and Toradol 30mg  for adjunct therapy.   Physical Exam at Discharge:  BP 112/53  Pulse 86  Temp(Src) 98.5 F (36.9 C) (Oral)  Resp 18  Ht 5\' 8"  (1.727 m)  Wt 65.772 kg (145 lb)  BMI 22.05 kg/m2  SpO2 100%  Gen: alert oriented in no acute distress Cardiovascular: Regular rate and rhythm, S1 and S2 normal, no murmur, rub or gallop  Respiratory:Clear to auscultation bilaterally, respirations unlabored  No vocal fremitus  Gastrointestinal:Soft, non-tender, bowel sounds active all four quadrant Extremities:Extremities normal, atraumatic, no cyanosis or edema Pulses: 2+ and symmetric all extremities   Disposition at Discharge: 01-Home or Self Care  Discharge Orders: Ms Contin 15mg  bid for 3 days(#6)  and [place] 5/325 1 Q 6 hrs prn for pain # 60   Condition at Discharge:   Stable  Time spent on Discharge:  Greater than 30 minutes.  Signed: Jalene Jennings P 01/23/2013, 12:36 AM

## 2013-01-24 ENCOUNTER — Telehealth: Payer: Self-pay | Admitting: Internal Medicine

## 2013-01-24 LAB — HEMOGLOBINOPATHY EVALUATION
Hemoglobin Other: 44.3 % — ABNORMAL HIGH
Hgb A: 0 % — ABNORMAL LOW (ref 96.8–97.8)
Hgb F Quant: 1.2 % — ABNORMAL HIGH (ref 0.0–2.0)
Hgb S Quant: 52.2 % — ABNORMAL HIGH

## 2013-01-24 NOTE — Telephone Encounter (Signed)
Called patient and no answer and unable to leave a message. All long acting narcotics come in pill form and that is likely the smallest pill.  Recommend taking it in apple sauce.

## 2013-01-25 ENCOUNTER — Encounter (HOSPITAL_BASED_OUTPATIENT_CLINIC_OR_DEPARTMENT_OTHER): Payer: Self-pay | Admitting: Emergency Medicine

## 2013-02-08 NOTE — Discharge Summary (Signed)
Pt seen and examined and discussed with NP Mal[place] Kadlec Edwards. Agree with discharge home. 

## 2013-04-01 ENCOUNTER — Encounter (HOSPITAL_COMMUNITY): Payer: Self-pay | Admitting: Emergency Medicine

## 2013-04-01 ENCOUNTER — Emergency Department (HOSPITAL_COMMUNITY)
Admission: EM | Admit: 2013-04-01 | Discharge: 2013-04-01 | Disposition: A | Discharge status: Home / Self Care | Payer: BC Managed Care – PPO | Attending: Emergency Medicine | Admitting: Emergency Medicine

## 2013-04-01 DIAGNOSIS — D5700 Hb-SS disease with crisis, unspecified: Secondary | ICD-10-CM | POA: Insufficient documentation

## 2013-04-01 DIAGNOSIS — Z87891 Personal history of nicotine dependence: Secondary | ICD-10-CM | POA: Insufficient documentation

## 2013-04-01 LAB — CBC WITH DIFFERENTIAL/PLATELET
Basophils Relative: 0 % (ref 0–1)
HCT: 34.7 % — ABNORMAL LOW (ref 39.0–52.0)
Hemoglobin: 12.7 g/dL — ABNORMAL LOW (ref 13.0–17.0)
MCHC: 36.6 g/dL — ABNORMAL HIGH (ref 30.0–36.0)
MCV: 77.3 fL — ABNORMAL LOW (ref 78.0–100.0)
Monocytes Absolute: 1.2 10*3/uL — ABNORMAL HIGH (ref 0.1–1.0)
Monocytes Relative: 10 % (ref 3–12)
Neutro Abs: 7.3 10*3/uL (ref 1.7–7.7)

## 2013-04-01 LAB — BASIC METABOLIC PANEL
BUN: 11 mg/dL (ref 6–23)
CO2: 25 mEq/L (ref 19–32)
Chloride: 104 mEq/L (ref 96–112)
Creatinine, Ser: 0.8 mg/dL (ref 0.50–1.35)

## 2013-04-01 MED ORDER — HYDROMORPHONE HCL PF 1 MG/ML IJ SOLN
1.0000 mg | Freq: Once | INTRAMUSCULAR | Status: AC
  Administered 2013-04-01: 1 mg via INTRAVENOUS
  Filled 2013-04-01: qty 1

## 2013-04-01 MED ORDER — SODIUM CHLORIDE 0.9 % IV BOLUS (SEPSIS)
1000.0000 mL | Freq: Once | INTRAVENOUS | Status: AC
  Administered 2013-04-01: 1000 mL via INTRAVENOUS

## 2013-04-01 MED ORDER — DIPHENHYDRAMINE HCL 50 MG/ML IJ SOLN
25.0000 mg | Freq: Once | INTRAMUSCULAR | Status: AC
  Administered 2013-04-01: 25 mg via INTRAVENOUS
  Filled 2013-04-01: qty 1

## 2013-04-01 MED ORDER — ONDANSETRON HCL 4 MG/2ML IJ SOLN
4.0000 mg | Freq: Once | INTRAMUSCULAR | Status: AC
  Administered 2013-04-01: 4 mg via INTRAVENOUS
  Filled 2013-04-01: qty 2

## 2013-04-01 MED ORDER — HYDROMORPHONE HCL PF 2 MG/ML IJ SOLN
2.0000 mg | Freq: Once | INTRAMUSCULAR | Status: AC
  Administered 2013-04-01: 2 mg via INTRAVENOUS
  Filled 2013-04-01: qty 1

## 2013-04-01 MED ORDER — KETOROLAC TROMETHAMINE 30 MG/ML IJ SOLN
30.0000 mg | Freq: Once | INTRAMUSCULAR | Status: AC
  Administered 2013-04-01: 30 mg via INTRAVENOUS
  Filled 2013-04-01: qty 1

## 2013-04-01 NOTE — ED Notes (Signed)
He tells me he has multiple muscle aches, esp. Of back and extremities.  He is in no distress; and thanks [place] for our care.  He denies fever, nor any other sign of current illness.

## 2013-04-01 NOTE — ED Provider Notes (Signed)
Medical screening examination/treatment/procedure(s) were performed by non-physician practitioner and as supervising physician I was immediately available for consultation/collaboration.   Lyanne Co, MD 04/01/13 2038

## 2013-04-01 NOTE — Discharge Instructions (Signed)
Follow up with you doctor for worsening or concerning symptoms. Refer to attached documents for more information.

## 2013-04-01 NOTE — ED Notes (Signed)
C/o sickle cell pain starting last night, head, back, chest, denies SOB

## 2013-04-01 NOTE — ED Provider Notes (Signed)
CSN: [id number]     Arrival date & time 04/01/13  1639 History   First MD Initiated Contact with Patient 04/01/13 1711     No chief complaint on file.  (Consider location/radiation/quality/duration/timing/severity/associated sxs/prior Treatment) HPI Comments: Patient is a 22 year old male with a past medical history of sickle cell disease who presents with a sickle cell pain crisis that started today. Symptoms started gradually and progressively worsened since the onset. The pain is "everywhere" which is typical of a crisis, according to the patient. The pain is located in his back, head, and chest. The pain is aching and severe without radiation. Patient tried ibuprofen at home for pain without relief. Patient is not prescribed Narcotic pain medication by choice. No aggravating/alleviating factors. No other associated symptoms.    Past Medical History  Diagnosis Date  . Sickle cell disease    Past Surgical History  Procedure Laterality Date  . Cholecystectomy     History reviewed. No pertinent family history. History  Substance Use Topics  . Smoking status: Former Games developer  . Smokeless tobacco: Never Used  . Alcohol Use: No    Review of Systems  Musculoskeletal: Positive for back pain and myalgias.  All other systems reviewed and are negative.    Allergies  Shellfish allergy  Home Medications   Current Outpatient Rx  Name  Route  Sig  Dispense  Refill  . ibuprofen (ADVIL,MOTRIN) 200 MG tablet   Oral   Take 400 mg by mouth every 6 (six) hours as needed for pain.          BP 115/69  Pulse 65  Temp(Src) 98.3 F (36.8 C) (Oral)  Resp 14  SpO2 99% Physical Exam  Nursing note and vitals reviewed. Constitutional: He is oriented to person, place, and time. He appears well-developed and well-nourished. No distress.  HENT:  Head: Normocephalic and atraumatic.  Eyes: Conjunctivae and EOM are normal.  Neck: Normal range of motion.  Cardiovascular: Normal rate and regular  rhythm.  Exam reveals no gallop and no friction rub.   No murmur heard. Pulmonary/Chest: Effort normal and breath sounds normal. He has no wheezes. He has no rales. He exhibits no tenderness.  Abdominal: Soft. He exhibits no distension. There is no tenderness. There is no rebound and no guarding.  Musculoskeletal: Normal range of motion.  Mild paraspinal lumbar tenderness to palpation. No midline tenderness to palpation.   Neurological: He is alert and oriented to person, place, and time. Coordination normal.  Speech is goal-oriented. Moves limbs without ataxia.   Skin: Skin is warm and dry.  Psychiatric: He has a normal mood and affect. His behavior is normal.    ED Course  Procedures (including critical care time) Labs Review Labs Reviewed  CBC WITH DIFFERENTIAL - Abnormal; Notable for the following:    WBC 11.5 (*)    Hemoglobin 12.7 (*)    HCT 34.7 (*)    MCV 77.3 (*)    MCHC 36.6 (*)    Platelets 141 (*)    Monocytes Absolute 1.2 (*)    All other components within normal limits  RETICULOCYTES - Abnormal; Notable for the following:    Retic Ct Pct 4.9 (*)    Retic Count, Manual 220.0 (*)    All other components within normal limits  BASIC METABOLIC PANEL   Imaging Review No results found.  EKG Interpretation   None       MDM   1. Sickle cell pain crisis  8:10 PM Patient feeling better. Labs unremarkable for acute change. Vitals stable and patient afebrile. No further evaluation needed at this time. Patient instructed to follow up with Dr. Ashley Royalty tomorrow for further evaluation. No further evaluation needed at this time.     Emilia Beck, PA-C 04/01/13 2022

## 2013-04-02 ENCOUNTER — Encounter (HOSPITAL_COMMUNITY): Payer: Self-pay

## 2013-04-02 ENCOUNTER — Telehealth (HOSPITAL_COMMUNITY): Payer: Self-pay | Admitting: Hematology

## 2013-04-02 ENCOUNTER — Non-Acute Institutional Stay (HOSPITAL_COMMUNITY)
Admission: AD | Admit: 2013-04-02 | Discharge: 2013-04-02 | Disposition: A | Discharge status: Home / Self Care | Payer: BC Managed Care – PPO | Attending: Internal Medicine | Admitting: Internal Medicine

## 2013-04-02 ENCOUNTER — Non-Acute Institutional Stay (HOSPITAL_COMMUNITY): Payer: BC Managed Care – PPO

## 2013-04-02 ENCOUNTER — Telehealth: Payer: Self-pay | Admitting: Internal Medicine

## 2013-04-02 DIAGNOSIS — G8929 Other chronic pain: Secondary | ICD-10-CM | POA: Insufficient documentation

## 2013-04-02 DIAGNOSIS — D72829 Elevated white blood cell count, unspecified: Secondary | ICD-10-CM | POA: Insufficient documentation

## 2013-04-02 DIAGNOSIS — D5700 Hb-SS disease with crisis, unspecified: Secondary | ICD-10-CM | POA: Insufficient documentation

## 2013-04-02 DIAGNOSIS — R079 Chest pain, unspecified: Secondary | ICD-10-CM | POA: Insufficient documentation

## 2013-04-02 DIAGNOSIS — R5381 Other malaise: Secondary | ICD-10-CM | POA: Insufficient documentation

## 2013-04-02 LAB — COMPREHENSIVE METABOLIC PANEL
ALT: 13 U/L (ref 0–53)
Alkaline Phosphatase: 133 U/L — ABNORMAL HIGH (ref 39–117)
CO2: 23 mEq/L (ref 19–32)
GFR calc Af Amer: >90 mL/min (ref >90)
GFR calc non Af Amer: >90 mL/min (ref >90)
Glucose, Bld: 96 mg/dL (ref 70–99)
Potassium: 3.7 mEq/L (ref 3.5–5.1)
Sodium: 136 mEq/L (ref 135–145)

## 2013-04-02 MED ORDER — HYDROMORPHONE 0.3 MG/ML IV SOLN: INTRAVENOUS | Status: DC

## 2013-04-02 MED ORDER — OXYCODONE HCL 5 MG PO TABS: 5.0000 mg | ORAL_TABLET | ORAL | Status: DC

## 2013-04-02 MED ORDER — ONDANSETRON HCL 4 MG/2ML IJ SOLN: 4.0000 mg | Freq: Four times a day (QID) | INTRAMUSCULAR | Status: DC | PRN

## 2013-04-02 MED ORDER — DIPHENHYDRAMINE HCL 12.5 MG/5ML PO ELIX: 12.5000 mg | ORAL_SOLUTION | Freq: Four times a day (QID) | ORAL | Status: DC | PRN

## 2013-04-02 MED ORDER — DIPHENHYDRAMINE HCL 50 MG/ML IJ SOLN: 12.5000 mg | Freq: Four times a day (QID) | INTRAMUSCULAR | Status: DC | PRN

## 2013-04-02 MED ORDER — DEXTROSE-NACL 5-0.45 % IV SOLN
INTRAVENOUS | Status: DC
  Administered 2013-04-02: 15:00:00 via INTRAVENOUS

## 2013-04-02 MED ORDER — OXYCODONE HCL 5 MG PO TABS: 10.0000 mg | ORAL_TABLET | ORAL | Status: DC

## 2013-04-02 MED ORDER — HYDROMORPHONE 0.3 MG/ML IV SOLN
INTRAVENOUS | Status: DC
  Administered 2013-04-02: 2.2 mg via INTRAVENOUS
  Administered 2013-04-02: 15:00:00 via INTRAVENOUS
  Filled 2013-04-02: qty 25

## 2013-04-02 MED ORDER — HYDROMORPHONE HCL PF 2 MG/ML IJ SOLN
2.0000 mg | Freq: Once | INTRAMUSCULAR | Status: AC
  Administered 2013-04-02: 2 mg via INTRAVENOUS
  Filled 2013-04-02: qty 1

## 2013-04-02 MED ORDER — SODIUM CHLORIDE 0.9 % IJ SOLN: 9.0000 mL | INTRAMUSCULAR | Status: DC | PRN

## 2013-04-02 MED ORDER — KETOROLAC TROMETHAMINE 30 MG/ML IJ SOLN
30.0000 mg | Freq: Once | INTRAMUSCULAR | Status: AC
  Administered 2013-04-02: 30 mg via INTRAVENOUS
  Filled 2013-04-02: qty 1

## 2013-04-02 MED ORDER — NALOXONE HCL 0.4 MG/ML IJ SOLN: 0.4000 mg | INTRAMUSCULAR | Status: DC | PRN

## 2013-04-02 MED ORDER — OXYCODONE HCL 5 MG PO TABS
5.0000 mg | ORAL_TABLET | ORAL | Status: DC
  Administered 2013-04-02 (×2): 5 mg via ORAL
  Filled 2013-04-02 (×2): qty 1

## 2013-04-02 NOTE — Progress Notes (Signed)
Pt states that chest feels tight; NP Edwards notified; chest xray ordered; will continue to monitor

## 2013-04-02 NOTE — Telephone Encounter (Signed)
Patient called C/O pain that is generalized all over.  Patient rates his pain 9/10.  Patient denies fever, nausea or vomiting, abdominal pain.  I advised that I would notify the physician and give him a call back.  Patient verbalizes understanding.

## 2013-04-02 NOTE — Discharge Summary (Signed)
[facility]   Patient ID: William Jennings MRN: [id number] DOB/AGE: 12-Aug-1990 22 y.o.  Admit date: 04/02/2013 Discharge date: 04/02/2013  Primary Care Physician:  MATTHEWS,MICHELLE A., MD  Admission Diagnoses:  Active Problems:   Vaso occlusive crisis    Chronic pain    Fatigue      Leucocytosis   Discharge Diagnoses:    Vaso occlusive crisis    Chronic pain    Fatigue    Leucocytosis   Discharge Medications:    Medication List    ASK your doctor about these medications       ibuprofen 200 MG tablet  Commonly known as:  ADVIL,MOTRIN  Take 400 mg by mouth every 6 (six) hours as needed for pain.         Consults:  None  Significant Diagnostic Studies:  No results found.   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 He was initially seen by PCP. He was admitted to the [facility] for symptom management . Vaso occlusive crisis: IVF for hydration and PCA 0.3 Q 10 mins lock out of 15 in 4 hrs he required 11 demands and 11 deliveries hydromorphone for break through pain placed on Oxycodone 5mg  Q 4hrs .At yhe time of d/c his pain was done to 5/10 and his baseline is 3/10 but feels he is able to manage it at home   Leucocytosis: low grade temp also elevation maybe from inflammatory reactive process monitor  Acute on chronic pain: Oxycodone 5 mg Q 4 hrs prn for pain not controlled by ibuprofen.  Physical Exam at Discharge:  BP 121/53  Pulse 68  Temp(Src) 99 F (37.2 C) (Oral)  Resp 18  Wt 145 lb (65.772 kg)  BMI 22.05 kg/m2  SpO2 100%  Gen: alert oriented in no acute distress Cardiovascular: regular rate and rhythms no gallops rubs or murmurs  Respiratory:CTA anterior and posterior  Gastrointestinal: soft non tender BS x's 4  Extremities: full ROM     Disposition at Discharge: 01-Home or Self Care  Discharge Orders: Call  Clinic on Wednesday to let me know how you are feeling  Condition at Discharge:   Stable  Time spent on Discharge:  Greater than 30 minutes.  SignedRanda Evens, MICHELLE P 04/02/2013, 6:06 PM

## 2013-04-02 NOTE — Progress Notes (Signed)
Patient ID: William Jennings, male   DOB: September 27, 1990, 22 y.o.   MRN: [id number] Discharge instructions given to patient along with prescription.  IV removed without difficulty.  Patient's pain improved from 10 to 6.  Patient does report relief at discharge.Patient ambulatory at discharge, friend at bedside for transport.

## 2013-04-02 NOTE — Telephone Encounter (Signed)
Called patient back and advised that per Dr. Ashley Royalty, it is ok for him to come to the clinic.  Patient states he has to arrange a babysitter for his two daughters, but he would be to the day hospital as soon as possible.  Explained the hours of operation are no longer 23hrs, and we typically close at 8p.m.  Patient verbalizes understanding.

## 2013-04-04 ENCOUNTER — Telehealth (HOSPITAL_COMMUNITY): Payer: Self-pay | Admitting: Internal Medicine

## 2013-04-04 ENCOUNTER — Non-Acute Institutional Stay (HOSPITAL_COMMUNITY): Payer: BC Managed Care – PPO

## 2013-04-04 ENCOUNTER — Non-Acute Institutional Stay (HOSPITAL_COMMUNITY)
Admission: AD | Admit: 2013-04-04 | Discharge: 2013-04-04 | Disposition: A | Discharge status: Home / Self Care | Payer: BC Managed Care – PPO | Attending: Internal Medicine | Admitting: Internal Medicine

## 2013-04-04 ENCOUNTER — Encounter (HOSPITAL_COMMUNITY): Payer: Self-pay

## 2013-04-04 DIAGNOSIS — D57219 Sickle-cell/Hb-C disease with crisis, unspecified: Secondary | ICD-10-CM

## 2013-04-04 DIAGNOSIS — R51 Headache: Secondary | ICD-10-CM | POA: Insufficient documentation

## 2013-04-04 DIAGNOSIS — Z87891 Personal history of nicotine dependence: Secondary | ICD-10-CM | POA: Insufficient documentation

## 2013-04-04 DIAGNOSIS — D5700 Hb-SS disease with crisis, unspecified: Secondary | ICD-10-CM | POA: Insufficient documentation

## 2013-04-04 DIAGNOSIS — R209 Unspecified disturbances of skin sensation: Secondary | ICD-10-CM | POA: Insufficient documentation

## 2013-04-04 DIAGNOSIS — R079 Chest pain, unspecified: Secondary | ICD-10-CM | POA: Insufficient documentation

## 2013-04-04 LAB — CBC WITH DIFFERENTIAL/PLATELET
Basophils Absolute: 0 10*3/uL (ref 0.0–0.1)
Basophils Relative: 0 % (ref 0–1)
Eosinophils Relative: 0 % (ref 0–5)
Hemoglobin: 12.7 g/dL — ABNORMAL LOW (ref 13.0–17.0)
MCH: 27.8 pg (ref 26.0–34.0)
MCHC: 36.4 g/dL — ABNORMAL HIGH (ref 30.0–36.0)
Monocytes Relative: 16 % — ABNORMAL HIGH (ref 3–12)
Neutro Abs: 6.5 10*3/uL (ref 1.7–7.7)
Neutrophils Relative %: 62 % (ref 43–77)
RBC: 4.57 MIL/uL (ref 4.22–5.81)

## 2013-04-04 LAB — COMPREHENSIVE METABOLIC PANEL
ALT: 22 U/L (ref 0–53)
AST: 35 U/L (ref 0–37)
Albumin: 4.2 g/dL (ref 3.5–5.2)
Alkaline Phosphatase: 116 U/L (ref 39–117)
Chloride: 99 mEq/L (ref 96–112)
Potassium: 3.5 mEq/L (ref 3.5–5.1)
Sodium: 133 mEq/L — ABNORMAL LOW (ref 135–145)
Total Bilirubin: 1.9 mg/dL — ABNORMAL HIGH (ref 0.3–1.2)

## 2013-04-04 MED ORDER — METHYLPREDNISOLONE SODIUM SUCC 125 MG IJ SOLR
125.0000 mg | Freq: Once | INTRAMUSCULAR | Status: AC
  Administered 2013-04-04: 125 mg via INTRAVENOUS
  Filled 2013-04-04: qty 2

## 2013-04-04 MED ORDER — ONDANSETRON HCL 4 MG/2ML IJ SOLN: 4.0000 mg | Freq: Four times a day (QID) | INTRAMUSCULAR | Status: DC | PRN

## 2013-04-04 MED ORDER — HYDROMORPHONE 0.3 MG/ML IV SOLN
INTRAVENOUS | Status: DC
  Administered 2013-04-04: 0.599 mg via INTRAVENOUS
  Administered 2013-04-04: 0.2 mg via INTRAVENOUS
  Administered 2013-04-04: 13:00:00 via INTRAVENOUS
  Administered 2013-04-04: 2.49 mg via INTRAVENOUS
  Filled 2013-04-04: qty 25

## 2013-04-04 MED ORDER — SODIUM CHLORIDE 0.9 % IJ SOLN: 9.0000 mL | INTRAMUSCULAR | Status: DC | PRN

## 2013-04-04 MED ORDER — DEXTROSE-NACL 5-0.45 % IV SOLN
INTRAVENOUS | Status: DC
  Administered 2013-04-04: 13:00:00 via INTRAVENOUS

## 2013-04-04 MED ORDER — DIPHENHYDRAMINE HCL 12.5 MG/5ML PO ELIX: 12.5000 mg | ORAL_SOLUTION | Freq: Four times a day (QID) | ORAL | Status: DC | PRN

## 2013-04-04 MED ORDER — DIPHENHYDRAMINE HCL 50 MG/ML IJ SOLN: 12.5000 mg | Freq: Four times a day (QID) | INTRAMUSCULAR | Status: DC | PRN

## 2013-04-04 MED ORDER — HYDROMORPHONE HCL PF 2 MG/ML IJ SOLN: 1.0000 mg | Freq: Once | INTRAMUSCULAR | Status: DC

## 2013-04-04 MED ORDER — NALOXONE HCL 0.4 MG/ML IJ SOLN: 0.4000 mg | INTRAMUSCULAR | Status: DC | PRN

## 2013-04-04 NOTE — H&P (Signed)
[facility] History and Physical   Date: 04/04/2013  Patient name: William Jennings Medical record number: [id number] Date of birth: 15-Nov-1990 Age: 22 y.o. Gender: male PCP: Jennings,William Demattia A., MD  Attending physician: Altha Harm, MD  Chief Complaint: right side numbness and pain   History of Present Illness: 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 Sunday he went to the ED for sickle cell pain for arthralgia pain all over. He was d/c and was not able to manage and found he was not able to manage his pain at home. He called the [facility] for evaluation and was admitted for symptom management. Today he called the [facility] for facial pain and numbness to the right side of his face. After he thought about this he realized this numbness was since Monday. Unclear of origin of pain in his mouth.  Meds: Prescriptions prior to admission  Medication Sig Dispense Refill  . ibuprofen (ADVIL,MOTRIN) 200 MG tablet Take 400 mg by mouth every 6 (six) hours as needed for pain.      Marland Kitchen oxyCODONE (OXY IR/ROXICODONE) 5 MG immediate release tablet Take 1 tablet (5 mg total) by mouth every 4 (four) hours. May take 1-2 as needed for pain  60 tablet  0    Allergies: Shellfish allergy Past Medical History  Diagnosis Date  . Sickle cell disease    Past Surgical History  Procedure Laterality Date  . Cholecystectomy     History reviewed. No pertinent family history. History   Social History  . Marital Status: Single    Spouse Name: N/A    Number of Children: N/A  . Years of Education: N/A   Occupational History  . Not on file.   Social History Main Topics  . Smoking status: Former Games developer  . Smokeless tobacco: Never Used  . Alcohol Use: No  . Drug Use: No  . Sexual Activity: No   Other Topics Concern  . Not on file   Social History Narrative  . No narrative on file    Review of Systems: A comprehensive review of systems was negative except for:  Musculoskeletal: positive for arthralgias and facial numbness and tingling right side of face   Physical Exam: Blood pressure 113/60, pulse 87, temperature 99.7 F (37.6 C), temperature source Oral, resp. rate 20, SpO2 100.00%. BP 123/67  Pulse 95  Temp(Src) 99.2 F (37.3 C) (Oral)  Resp 17  SpO2 100%  General Appearance:    Alert, cooperative, no distress, appears stated age  Head:    Normocephalic, without obvious abnormality, atraumatic  Eyes:    PERRL, conjunctiva/corneas clear, EOM's intact  Ears:    Normal TM's and external ear canals, both ears  Nose:   Nares normal, septum midline, mucosa normal  Throat:   Lips, mucosa, and tongue normal; teeth and gums normal, facial numbness to lip, tenderness to right jaw   Neck:   Supple, symmetrical, trachea midline, no adenopathy;       thyroid:  No enlargement/tenderness/nodules; no carotid   bruit or JVD  Back:     Symmetric, no curvature, ROM normal, no CVA tenderness  Lungs:     Clear to auscultation bilaterally, respirations unlabored  Chest wall:    No tenderness or deformity  Heart:    Regular rate and rhythm, S1 and S2 normal, no murmur, rub   or gallop  Abdomen:     Soft, non-tender, bowel sounds active all four quadrants,  Extremities:   Extremities normal, atraumatic, no cyanosis or edema  Pulses:   2+ and symmetric all extremities  Skin:   Skin color, texture, turgor normal, no rashes or lesions  Lymph nodes:   Cervical, supraclavicular, and axillary nodes normal  Neurologic:   CNII-XII intact. Normal strength, sensation and reflexes      throughout    Lab results: Results for orders placed during the hospital encounter of 04/04/13 (from the past 24 hour(s))  CBC WITH DIFFERENTIAL     Status: Abnormal   Collection Time    04/04/13 12:35 PM      Result Value Range   WBC 10.5  4.0 - 10.5 K/uL   RBC 4.57  4.22 - 5.81 MIL/uL   Hemoglobin 12.7 (*) 13.0 - 17.0 g/dL   HCT 40.9 (*) 81.1 - 91.4 %   MCV 76.4 (*)  78.0 - 100.0 fL   MCH 27.8  26.0 - 34.0 pg   MCHC 36.4 (*) 30.0 - 36.0 g/dL   RDW 78.2  95.6 - 21.3 %   Platelets 131 (*) 150 - 400 K/uL   Neutrophils Relative % 62  43 - 77 %   Neutro Abs 6.5  1.7 - 7.7 K/uL   Lymphocytes Relative 21  12 - 46 %   Lymphs Abs 2.2  0.7 - 4.0 K/uL   Monocytes Relative 16 (*) 3 - 12 %   Monocytes Absolute 1.7 (*) 0.1 - 1.0 K/uL   Eosinophils Relative 0  0 - 5 %   Eosinophils Absolute 0.0  0.0 - 0.7 K/uL   Basophils Relative 0  0 - 1 %   Basophils Absolute 0.0  0.0 - 0.1 K/uL  COMPREHENSIVE METABOLIC PANEL     Status: Abnormal   Collection Time    04/04/13 12:35 PM      Result Value Range   Sodium 133 (*) 135 - 145 mEq/L   Potassium 3.5  3.5 - 5.1 mEq/L   Chloride 99  96 - 112 mEq/L   CO2 24  19 - 32 mEq/L   Glucose, Bld 98  70 - 99 mg/dL   BUN 6  6 - 23 mg/dL   Creatinine, Ser 0.86  0.50 - 1.35 mg/dL   Calcium 9.7  8.4 - 57.8 mg/dL   Total Protein 8.0  6.0 - 8.3 g/dL   Albumin 4.2  3.5 - 5.2 g/dL   AST 35  0 - 37 U/L   ALT 22  0 - 53 U/L   Alkaline Phosphatase 116  39 - 117 U/L   Total Bilirubin 1.9 (*) 0.3 - 1.2 mg/dL   GFR calc non Af Amer >90  >90 mL/min   GFR calc Af Amer >90  >90 mL/min    Imaging results:  Dg Chest 2 View  04/02/2013   CLINICAL DATA:  Sickle cell. Chest pain  EXAM: CHEST  2 VIEW  COMPARISON:  08/13/2012  FINDINGS: The heart size and mediastinal contours are within normal limits. Both lungs are clear. The visualized skeletal structures are unremarkable.  IMPRESSION: No active cardiopulmonary disease.   Electronically Signed   By: Marlan Palau M.D.   On: 04/02/2013 18:57     Assessment & Plan: Questionable allergic reaction unknown origin: 1 dose of  solumedrol 125mg   Vaso occlusive crisis: he will have his pain manage with a PCA hydromorphone, IVF for hydration and antiemetics associated with symptoms with analgesic. Benadryl ( unknown if reaction was associated with this medication) held if  needed consider Atarax .    [name] P 04/04/2013, 2:36 PM

## 2013-04-04 NOTE — Progress Notes (Signed)
Patient ID: William Jennings, male   DOB: 12/18/1990, 22 y.o.   MRN: [id number] Discharge instructions given to patient, girlfriend at bedside.  IV removed without difficulty.  Work excuse given to patient.  Per patient, "feel much better, pain is a 3".  Questions answered, patient ambulatory and in NAD at discharge.

## 2013-04-06 LAB — HEMOGLOBINOPATHY EVALUATION: Hemoglobin Other: 43.4 % — ABNORMAL HIGH

## 2013-04-09 NOTE — Discharge Summary (Signed)
[facility] Discharge Summary   Patient ID: William Jennings MRN: [id number] DOB/AGE: Jul 21, 1990 22 y.o.  Admit date: 04/04/2013 Discharge date: 04/09/2013  Primary Care Physician:  MATTHEWS,MICHELLE A., MD  Admission Diagnoses:  Active Problems:    Vaso occlusive crisis    Local reaction unknown etiology     Acute on chronic pain   Discharge Diagnoses:      Vaso occlusive crisis    Local reaction unknown etiology     Acute on chronic pain     Discharge Medications:    Medication List    ASK your doctor about these medications       ibuprofen 200 MG tablet  Commonly known as:  ADVIL,MOTRIN  Take 400 mg by mouth every 6 (six) hours as needed for pain.     oxyCODONE 5 MG immediate release tablet  Commonly known as:  Oxy IR/ROXICODONE  Take 1 tablet (5 mg total) by mouth every 4 (four) hours. May take 1-2 as needed for pain         Consults:  None - Pt made appt with dentist with the assistance of Dr. Ashley Royalty   Significant Diagnostic Studies:  Dg Orthopantogram  04/04/2013   CLINICAL DATA:  Tenderness involving right lower mandible.  EXAM: ORTHOPANTOGRAM/PANORAMIC  COMPARISON:  None.  FINDINGS: No fracture, bony lesion or dental abnormality is identified.  IMPRESSION: Normal orthopantomogram.   Electronically Signed   By: Irish Lack M.D.   On: 04/04/2013 16:47   Dg Chest 2 View  04/02/2013   CLINICAL DATA:  Sickle cell. Chest pain  EXAM: CHEST  2 VIEW  COMPARISON:  08/13/2012  FINDINGS: The heart size and mediastinal contours are within normal limits. Both lungs are clear. The visualized skeletal structures are unremarkable.  IMPRESSION: No active cardiopulmonary disease.   Electronically Signed   By: Marlan Palau M.D.   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 Sunday he went to the ED for sickle  cell pain for arthralgia pain all over. He was driving on the way to the  [facility] for evaluation. The nurse called  and was orders to admit.  He was concerned about facial pain and numbness to the right side of his face. After he thought about this he realized this numbness was since Monday. Unclear of origin of pain in his mouth and numbness in jaw.  Physical Exam at Discharge: .  BP 123/67  Pulse 95  Temp(Src) 99.2 F (37.3 C) (Oral)  Resp 17  SpO2 100%  Gen: Alert, cooperative, no acute  distress Cardiovascular: Regular rate and rhythm, S1 and S2 normal, no murmur, rub or gallop  Respiratory:Clear to auscultation bilaterally, respirations unlabored  Gastrointestinal:Soft, non-tender, bowel sounds active all four quadrants, Extremities: normal, atraumatic, no cyanosis or edema  Pulses: 2+ and symmetric all extremities     Disposition at Discharge: 01-Home or Self Care  Discharge Orders: Keep dentist appt  F/U with PCP 1-2 weeks   Condition at Discharge:   Stable  Time spent on Discharge:  Greater than 30 minutes.  SignedRanda Evens, MICHELLE P 04/09/2013, 12:14 PM

## 2013-04-12 NOTE — Discharge Summary (Signed)
Pt examined and discussed with NP Emry Barbato Edwards. Agree with discharge home. 

## 2013-04-13 NOTE — Telephone Encounter (Signed)
Close encounter 

## 2013-04-13 NOTE — H&P (Signed)
[facility] History and Physical   Date: 04/13/2013  Patient name: William Jennings Medical record number: [id number] Date of birth: 1990-12-31 Age: 22 y.o. Gender: male PCP: MATTHEWS,MICHELLE A., MD  Attending physician: No att. providers found  Chief Complaint: right side numbness and pain   History of Present Illness: Mr. 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 Sunday he went to the ED for sickle cell pain for arthralgia pain all over. He was d/c and was not able to managed his pain at home.  Meds: No prescriptions prior to admission    Allergies: Shellfish allergy Past Medical History  Diagnosis Date  . Sickle cell disease    Past Surgical History  Procedure Laterality Date  . Cholecystectomy     History reviewed. No pertinent family history. History   Social History  . Marital Status: Single    Spouse Name: N/A    Number of Children: N/A  . Years of Education: N/A   Occupational History  . Not on file.   Social History Main Topics  . Smoking status: Former Games developer  . Smokeless tobacco: Never Used  . Alcohol Use: No  . Drug Use: No  . Sexual Activity: No   Other Topics Concern  . Not on file   Social History Narrative  . No narrative on file    Review of Systems: A comprehensive review of systems was negative except for: Musculoskeletal: positive for arthralgias and facial numbness and tingling right side of face   Physical Exam: Blood pressure 111/64, pulse 88, temperature 98.4 F (36.9 C), temperature source Oral, resp. rate 21, weight 145 lb (65.772 kg), SpO2 99.00%. BP 111/64  Pulse 88  Temp(Src) 98.4 F (36.9 C) (Oral)  Resp 21  Wt 145 lb (65.772 kg)  BMI 22.05 kg/m2  SpO2 99%  General Appearance:    Alert, cooperative, no distress, appears stated age  Head:    Normocephalic, without obvious abnormality, atraumatic  Eyes:    PERRL, conjunctiva/corneas clear, EOM's intact  Ears:    Normal TM's and external ear  canals, both ears  Nose:   Nares normal, septum midline, mucosa normal  Throat:   Lips, mucosa, and tongue normal; teeth and gums normal, facial numbness to lip, tenderness to right jaw   Neck:   Supple, symmetrical, trachea midline, no adenopathy;       thyroid:  No enlargement/tenderness/nodules; no carotid   bruit or JVD  Back:     Symmetric, no curvature, ROM normal, no CVA tenderness  Lungs:     Clear to auscultation bilaterally, respirations unlabored  Chest wall:    No tenderness or deformity  Heart:    Regular rate and rhythm, S1 and S2 normal, no murmur, rub   or gallop  Abdomen:     Soft, non-tender, bowel sounds active all four quadrants,           Extremities:   Extremities normal, atraumatic, no cyanosis or edema  Pulses:   2+ and symmetric all extremities  Skin:   Skin color, texture, turgor normal, no rashes or lesions  Lymph nodes:   Cervical, supraclavicular, and axillary nodes normal  Neurologic:   CNII-XII intact. Normal strength, sensation and reflexes      throughout    Lab results: No results found for this or any previous visit (from the past 24 hour(s)).  Imaging results:  No results found.   Assessment & Plan:  Vaso occlusive crisis: he will  have his pain manage with a PCA hydromorphone, IVF for hydration and antiemetics associated with symptoms with analgesic. Acute on chronic pain: ibuprofen 400mg  Q 6 prn  EDWARDS, MICHELLE P 04/13/2013, 4:51 PM

## 2013-04-16 NOTE — Discharge Summary (Signed)
Pt examined and discussed with NP Gwinda Passe.  Agree with discharge home. Pt has an appointment top follow up with Dr. Salvadore Farber on Monday  04/09/2013 for further evaluation of gums.

## 2013-04-16 NOTE — H&P (Signed)
Pt was seen in ED on Sunday and continued to have pain beyond that which could be managed at home with oral analgesics. He was examined and discussed with NP Gwinda Passe. Agree with assessment and plan.

## 2013-04-16 NOTE — H&P (Signed)
Pt was admitted wit facial pain and questionable numbness. He was interviewed and examined by me . He described gum pain and not numbness. He had no focal neurological deficits and in particular no loss of sensation. He stated that he thought that he may have had an allergic reaction to Benadryl. We discussed that this was unlikely an allergic reaction to benadryl and likely neuropathic pain related to sickle cell. He had already received a dose of Solumedrol from NP although I don not believe that this will benefit the patient. On examination, the patient had an area of swelling on his right mandibular region. I agree with treatment of simple acute pain. I have also ordered an orthopantogram to evaluate for any evidence of mandibular abnormality.

## 2013-04-19 ENCOUNTER — Other Ambulatory Visit: Payer: Self-pay

## 2013-06-12 ENCOUNTER — Emergency Department (HOSPITAL_BASED_OUTPATIENT_CLINIC_OR_DEPARTMENT_OTHER)
Admission: EM | Admit: 2013-06-12 | Discharge: 2013-06-13 | Disposition: A | Discharge status: Home / Self Care | Payer: BC Managed Care – PPO | Attending: Emergency Medicine | Admitting: Emergency Medicine

## 2013-06-12 ENCOUNTER — Encounter (HOSPITAL_BASED_OUTPATIENT_CLINIC_OR_DEPARTMENT_OTHER): Payer: Self-pay | Admitting: Emergency Medicine

## 2013-06-12 DIAGNOSIS — Z79899 Other long term (current) drug therapy: Secondary | ICD-10-CM | POA: Insufficient documentation

## 2013-06-12 DIAGNOSIS — D5700 Hb-SS disease with crisis, unspecified: Secondary | ICD-10-CM

## 2013-06-12 DIAGNOSIS — R7401 Elevation of levels of liver transaminase levels: Secondary | ICD-10-CM | POA: Insufficient documentation

## 2013-06-12 DIAGNOSIS — Z87891 Personal history of nicotine dependence: Secondary | ICD-10-CM | POA: Insufficient documentation

## 2013-06-12 DIAGNOSIS — R7402 Elevation of levels of lactic acid dehydrogenase (LDH): Secondary | ICD-10-CM | POA: Insufficient documentation

## 2013-06-12 LAB — CBC
HCT: 35.2 % — ABNORMAL LOW (ref 39.0–52.0)
Hemoglobin: 13 g/dL (ref 13.0–17.0)
MCH: 27.7 pg (ref 26.0–34.0)
MCHC: 36.9 g/dL — ABNORMAL HIGH (ref 30.0–36.0)
MCV: 74.9 fL — ABNORMAL LOW (ref 78.0–100.0)
Platelets: 149 K/uL — ABNORMAL LOW (ref 150–400)
RBC: 4.7 MIL/uL (ref 4.22–5.81)
RDW: 16.2 % — ABNORMAL HIGH (ref 11.5–15.5)
WBC: 12.3 K/uL — ABNORMAL HIGH (ref 4.0–10.5)

## 2013-06-12 LAB — COMPREHENSIVE METABOLIC PANEL
ALT: 123 U/L — ABNORMAL HIGH (ref 0–53)
Albumin: 4.8 g/dL (ref 3.5–5.2)
Alkaline Phosphatase: 165 U/L — ABNORMAL HIGH (ref 39–117)
BUN: 5 mg/dL — ABNORMAL LOW (ref 6–23)
CO2: 25 mEq/L (ref 19–32)
Chloride: 100 mEq/L (ref 96–112)
Creatinine, Ser: 0.8 mg/dL (ref 0.50–1.35)
GFR calc Af Amer: >90 mL/min (ref >90)
GFR calc non Af Amer: >90 mL/min (ref >90)
Glucose, Bld: 107 mg/dL — ABNORMAL HIGH (ref 70–99)
Potassium: 4.5 mEq/L (ref 3.7–5.3)
Sodium: 139 mEq/L (ref 137–147)
Total Bilirubin: 1.6 mg/dL — ABNORMAL HIGH (ref 0.3–1.2)

## 2013-06-12 LAB — RETICULOCYTES
RBC.: 4.76 MIL/uL (ref 4.22–5.81)
Retic Count, Absolute: 176.1 K/uL (ref 19.0–186.0)
Retic Ct Pct: 3.7 % — ABNORMAL HIGH (ref 0.4–3.1)

## 2013-06-12 LAB — ACETAMINOPHEN LEVEL: Acetaminophen (Tylenol), Serum: <15 ug/mL (ref 10–30)

## 2013-06-12 MED ORDER — KETOROLAC TROMETHAMINE 30 MG/ML IJ SOLN
30.0000 mg | Freq: Once | INTRAMUSCULAR | Status: AC
  Administered 2013-06-12: 30 mg via INTRAVENOUS
  Filled 2013-06-12: qty 1

## 2013-06-12 MED ORDER — HYDROMORPHONE HCL PF 1 MG/ML IJ SOLN
1.0000 mg | Freq: Once | INTRAMUSCULAR | Status: AC
  Administered 2013-06-12: 1 mg via INTRAVENOUS
  Filled 2013-06-12: qty 1

## 2013-06-12 MED ORDER — ONDANSETRON HCL 4 MG PO TABS: 4.0000 mg | ORAL_TABLET | Freq: Four times a day (QID) | ORAL | Status: DC

## 2013-06-12 MED ORDER — DIPHENHYDRAMINE HCL 50 MG/ML IJ SOLN
25.0000 mg | Freq: Once | INTRAMUSCULAR | Status: AC
  Administered 2013-06-12: 25 mg via INTRAVENOUS
  Filled 2013-06-12: qty 1

## 2013-06-12 NOTE — ED Provider Notes (Signed)
CSN: [id number]     Arrival date & time 06/12/13  2108 History   First MD Initiated Contact with Patient 06/12/13 2120     This chart was scribed for Ethelda Chick, MD by Arlan Organ, ED Scribe. This patient was seen in room MH06/MH06 and the patient's care was started 9:34 PM.   Chief Complaint  Patient presents with  . Sickle Cell Pain Crisis   Patient is a 22 y.o. male presenting with sickle cell pain. The history is provided by the patient. No language interpreter was used.  Sickle Cell Pain Crisis Location:  Lower extremity Severity:  Severe Onset quality:  Gradual Duration:  1 day Similar to previous crisis episodes: yes   Timing:  Constant Progression:  Unchanged Chronicity:  Recurrent Relieved by:  Nothing Worsened by:  Nothing tried Ineffective treatments:  OTC medications and prescription drugs Associated symptoms: no fever     HPI Comments: William Jennings is a 22 y.o. male who presents to the Emergency Department complaining of a sickle cell crisis that initially started last night. Pt reports pain to both lower extremities bilaterally. He has tried morphine, ibuprofen, and hydrocodone  with no relief. Pt says he typically experiences pain in his legs when he has a flare up. He states he usually takes Tylenol at home, and prefers to only use narcotics while in the hospital. He denies CP, fever, cough or SOB.  Past Medical History  Diagnosis Date  . Sickle cell disease    Past Surgical History  Procedure Laterality Date  . Cholecystectomy     No family history on file. History  Substance Use Topics  . Smoking status: Former Games developer  . Smokeless tobacco: Never Used  . Alcohol Use: Yes     Comment: occasionally    Review of Systems  Constitutional: Negative for fever and chills.  Musculoskeletal: Positive for myalgias.  All other systems reviewed and are negative.    Allergies  Shellfish allergy  Home Medications   Current Outpatient Rx  Name   Route  Sig  Dispense  Refill  . folic acid (FOLVITE) 800 MCG tablet   Oral   Take 800 mcg by mouth daily.         Marland Kitchen HYDROcodone-acetaminophen (NORCO/VICODIN) 5-325 MG per tablet   Oral   Take 1 tablet by mouth every 6 (six) hours as needed for moderate pain.   30 tablet   0   . ibuprofen (ADVIL,MOTRIN) 200 MG tablet   Oral   Take 400 mg by mouth every 6 (six) hours as needed for pain.         Marland Kitchen ondansetron (ZOFRAN) 4 MG tablet   Oral   Take 1 tablet (4 mg total) by mouth every 6 (six) hours.   12 tablet   0    Triage Vitals: BP 117/65  Pulse 71  Temp(Src) 98.5 F (36.9 C) (Oral)  Resp 18  SpO2 98%  Physical Exam  Nursing note and vitals reviewed. Constitutional: He is oriented to person, place, and time. He appears well-developed and well-nourished.  HENT:  Head: Normocephalic.  Eyes: EOM are normal.  Neck: Normal range of motion.  Pulmonary/Chest: Effort normal.  Abdominal: He exhibits no distension.  Musculoskeletal: Normal range of motion.  Neurological: He is alert and oriented to person, place, and time.  Psychiatric: He has a normal mood and affect.  Note- chest CTAB, CV RRR, no murmur/clicks/gallops, Ext- no swelling of extremitiies. MS- diffuse tenderness to palpation of  bilateral lower extermities Abd- no tenderness to palpation, nabs  ED Course  Procedures (including critical care time)  DIAGNOSTIC STUDIES: Oxygen Saturation is 98% on RA, Normal by my interpretation.    COORDINATION OF CARE: 9:40 PM- Will order blood panel. Will give Dilaudid, Toradol, and Benadryl. Discussed treatment plan with pt at bedside and pt agreed to plan.    11:20 PM pt feeling improved on recheck, requests one more dose of pain medication.  Then will discharge to home.  Discussed elevation in liver enzymes, will need outpatient abdominal ultraosund- no vomiting or abdominal pain or tenderness at this time.     Labs Review Labs Reviewed  CBC - Abnormal; Notable for the  following:    WBC 12.3 (*)    HCT 35.2 (*)    MCV 74.9 (*)    MCHC 36.9 (*)    RDW 16.2 (*)    Platelets 149 (*)    All other components within normal limits  COMPREHENSIVE METABOLIC PANEL - Abnormal; Notable for the following:    Glucose, Bld 107 (*)    BUN 5 (*)    Total Protein 8.8 (*)    AST 150 (*)    ALT 123 (*)    Alkaline Phosphatase 165 (*)    Total Bilirubin 1.6 (*)    All other components within normal limits  RETICULOCYTES - Abnormal; Notable for the following:    Retic Ct Pct 3.7 (*)    All other components within normal limits  ACETAMINOPHEN LEVEL   Imaging Review No results found.  EKG Interpretation   None       MDM   1. Sickle cell pain crisis   2. Transaminitis    Pt presenting with c/o pain crisis similar to his prior pain crises in bilateral lower legs.  No fever, cough or chest pain to suggest ACS.  Pt treated with IV pain meds including toradol.  hgb at his baseline.  Found to have some elevation in LFTS, no abdominal tenderness, no hepatomegaly, he has had elevations in the past, tbili at/near baseline.  Pt advised of this finding and need for outpatient ultrasound as no ultrasound available at this facility.  I do not believe CT scan would be very informative.  Pt verbalized agreement and he will f/u with Dr. Jerolyn Center and arrange for an ultrasound.  His pain is improved after 2 doses of dilaudid, requests one further dose prior to discharge.  Given rx for zofran, he has vicodin to take at home. Discharged with strict return precautions.  Pt agreeable with plan.  I personally performed the services described in this documentation, which was scribed in my presence. The recorded information has been reviewed and is accurate.   Ethelda Chick, MD 06/14/13 1126

## 2013-06-12 NOTE — ED Notes (Signed)
C/o bilateral leg pain onset last pm

## 2013-06-12 NOTE — ED Notes (Signed)
C/o sickle cell crisis, onset last night, pain at bilateral legs and abdominal. No relief from home med (vicodin). No CP, no SOB

## 2013-06-13 ENCOUNTER — Telehealth: Payer: Self-pay | Admitting: Hematology

## 2013-06-13 ENCOUNTER — Encounter: Payer: Self-pay | Admitting: Internal Medicine

## 2013-06-13 ENCOUNTER — Non-Acute Institutional Stay (HOSPITAL_COMMUNITY)
Admission: AD | Admit: 2013-06-13 | Discharge: 2013-06-13 | Disposition: A | Discharge status: Home / Self Care | Payer: BC Managed Care – PPO | Attending: Internal Medicine | Admitting: Internal Medicine

## 2013-06-13 ENCOUNTER — Encounter (HOSPITAL_COMMUNITY): Payer: Self-pay | Admitting: Hematology

## 2013-06-13 DIAGNOSIS — Z87891 Personal history of nicotine dependence: Secondary | ICD-10-CM | POA: Insufficient documentation

## 2013-06-13 DIAGNOSIS — Z79899 Other long term (current) drug therapy: Secondary | ICD-10-CM | POA: Insufficient documentation

## 2013-06-13 DIAGNOSIS — D5700 Hb-SS disease with crisis, unspecified: Secondary | ICD-10-CM | POA: Insufficient documentation

## 2013-06-13 DIAGNOSIS — M549 Dorsalgia, unspecified: Secondary | ICD-10-CM | POA: Insufficient documentation

## 2013-06-13 DIAGNOSIS — M79609 Pain in unspecified limb: Secondary | ICD-10-CM | POA: Insufficient documentation

## 2013-06-13 DIAGNOSIS — D57219 Sickle-cell/Hb-C disease with crisis, unspecified: Secondary | ICD-10-CM

## 2013-06-13 MED ORDER — HYDROMORPHONE HCL PF 2 MG/ML IJ SOLN
0.5000 mg | Freq: Once | INTRAMUSCULAR | Status: AC
  Administered 2013-06-13: 0.5 mg via INTRAVENOUS
  Filled 2013-06-13: qty 1

## 2013-06-13 MED ORDER — HYDROCODONE-ACETAMINOPHEN 5-325 MG PO TABS
1.0000 | ORAL_TABLET | Freq: Once | ORAL | Status: AC
  Administered 2013-06-13: 1 via ORAL
  Filled 2013-06-13: qty 1

## 2013-06-13 MED ORDER — FOLIC ACID 1 MG PO TABS: 1.0000 mg | ORAL_TABLET | Freq: Every day | ORAL | Status: DC

## 2013-06-13 MED ORDER — DEXTROSE-NACL 5-0.45 % IV SOLN
INTRAVENOUS | Status: DC
  Administered 2013-06-13: 15:00:00 via INTRAVENOUS

## 2013-06-13 MED ORDER — HYDROCODONE-ACETAMINOPHEN 5-325 MG PO TABS: 1.0000 | ORAL_TABLET | Freq: Four times a day (QID) | ORAL | Status: DC | PRN

## 2013-06-13 MED ORDER — HYDROMORPHONE HCL PF 2 MG/ML IJ SOLN
1.0000 mg | Freq: Once | INTRAMUSCULAR | Status: AC
  Administered 2013-06-13: 1 mg via INTRAVENOUS
  Filled 2013-06-13: qty 1

## 2013-06-13 NOTE — H&P (Signed)
[facility] History and Physical  William Jennings AVW:[id number] DOB: 06-10-1991 DOA: 06/13/2013   PCP: William Betzold A., MD   Chief Complaint: Pain in back and legs x 3 days.  HPI:Pt who rarely seeks medical care for acute pain presents after having been to the ED last night for treatment of acute pain. Pt states that he was treated with IV analgesics and had good relief resulting in discharge home this morning. However, Patient states that after sleeping he awoke in pain 7/10 and had no oral analgesics. He called requesting treatment for acute management of pain associated with Sickle cell. His pain is currently 8/10 and localized to back and legs. He describes pain as throbbing in nature and without any palliative or provocative features. He also denies and F/C/N/V/D.  Review of Systems:  Constitutional: No weight loss, night sweats, Fevers, chills, fatigue.  HEENT: No headaches, dizziness, seizures, vision changes, difficulty swallowing,Tooth/dental problems,Sore throat, No sneezing, itching, ear ache, nasal congestion, post nasal drip,  Cardio-vascular: No chest pain, Orthopnea, PND, swelling in lower extremities, anasarca, dizziness, palpitations  GI: No heartburn, indigestion, abdominal pain, nausea, vomiting, diarrhea, change in bowel habits, loss of appetite  Resp: No shortness of breath with exertion or at rest. No excess mucus, no productive cough, No non-productive cough, No coughing up of blood.No change in color of mucus.No wheezing.No chest wall deformity  Skin: no rash or lesions.  GU: no dysuria, change in color of urine, no urgency or frequency. No flank pain.   Psych: No change in mood or affect. No depression or anxiety. No memory loss.    Past Medical History  Diagnosis Date  . Sickle cell disease    Past Surgical History  Procedure Laterality Date  . Cholecystectomy     Social History:  reports that he has quit smoking. He has never used smokeless  tobacco. He reports that he drinks alcohol. He reports that he does not use illicit drugs.  Allergies  Allergen Reactions  . Shellfish Allergy Anaphylaxis    History reviewed. No pertinent family history.  Prior to Admission medications   Medication Sig Start Date End Date Taking? Authorizing Provider  folic acid (FOLVITE) 800 MCG tablet Take 800 mcg by mouth daily.   Yes Historical Provider, MD  HYDROcodone-acetaminophen (NORCO/VICODIN) 5-325 MG per tablet Take 1 tablet by mouth every 6 (six) hours as needed for moderate pain.   Yes Historical Provider, MD  ibuprofen (ADVIL,MOTRIN) 200 MG tablet Take 400 mg by mouth every 6 (six) hours as needed for pain.   Yes Historical Provider, MD  morphine (MS CONTIN) 15 MG 12 hr tablet Take 15 mg by mouth every 12 (twelve) hours.   Yes Historical Provider, MD  ondansetron (ZOFRAN) 4 MG tablet Take 1 tablet (4 mg total) by mouth every 6 (six) hours. 06/12/13  Yes Ethelda Chick, MD   Physical Exam: Filed Vitals:   06/13/13 1400  BP: 120/73  Pulse: 91  Temp: 98.6 F (37 C)  TempSrc: Oral  Resp: 20  Height: 5\' 8"  (1.727 m)  Weight: 145 lb (65.772 kg)  SpO2: 100%   BP 120/73  Pulse 91  Temp(Src) 98.6 F (37 C) (Oral)  Resp 20  Ht 5\' 8"  (1.727 m)  Wt 145 lb (65.772 kg)  BMI 22.05 kg/m2  SpO2 100%  General Appearance:    Alert, cooperative, mild distress secondary to pain, appears stated age  Head:    Normocephalic, without obvious abnormality, atraumatic  Eyes:  PERRL, conjunctiva/corneas clear, EOM's intact, fundi    benign, both eyes anicteric     Throat:   Lips, mucosa, and tongue normal; teeth and gums normal  Neck:   Supple, symmetrical, trachea midline, no adenopathy;       thyroid:  No enlargement/tenderness/nodules; no carotid   bruit or JVD  Back:     Symmetric, no curvature, ROM normal, no CVA tenderness  Lungs:     Clear to auscultation bilaterally, respirations unlabored  Chest wall:    No tenderness or deformity   Heart:    Regular rate and rhythm, S1 and S2 normal, no murmur, rub   or gallop  Abdomen:     Soft, non-tender, bowel sounds active all four quadrants,    no masses, no organomegaly  Extremities:   Extremities normal, atraumatic, no cyanosis or edema  Pulses:   2+ and symmetric all extremities  Skin:   Skin color, texture, turgor normal, no rashes or lesions  Lymph nodes:   Cervical, supraclavicular, and axillary nodes normal  Neurologic:   CNII-XII intact. Normal strength, sensation and reflexes      throughout    Labs on Admission:   Basic Metabolic Panel:  Recent Labs Lab 06/12/13 2140  NA 139  K 4.5  CL 100  CO2 25  GLUCOSE 107*  BUN 5*  CREATININE 0.80  CALCIUM 9.7   Liver Function Tests:  Recent Labs Lab 06/12/13 2140  AST 150*  ALT 123*  ALKPHOS 165*  BILITOT 1.6*  PROT 8.8*  ALBUMIN 4.8   CBC:  Recent Labs Lab 06/12/13 2140  WBC 12.3*  HGB 13.0  HCT 35.2*  MCV 74.9*  PLT 149*    Assessment/Plan: Active Problems: Hb SS with simple acute pain: Pt will be treated with IVF and weight based IV analgesics. He is opiate naive and will start dose at 0.5 mg of dilaudid with rapid re-dosing based on response to therapy. I have reviewed his labs studies from last night and will proceed with Toradol IV to treat inflammatory component. Will likely transition to oral medications and anticipate discharge to home.   Time spend: 40 minutes Code Status: Full Code Family Communication: N/A Disposition Plan: Anticipate home at discharge  William Blaney A., MD  Pager [phone]  If 7PM-7AM, please contact night-coverage [email or web address] Password [facility] 06/13/2013, 2:41 PM

## 2013-06-13 NOTE — Telephone Encounter (Signed)
Patient C/O bilateral leg pain that he is rating 7-8/10.  Patient states he has taken Ibuprofen, morphine, and hydrocodone without any improvement.  Per patient, he was not able to walk yesterday evening so he went to the emergency room.  Patient states that he received treatment and his pain improved and he was able to ambulate at discharge.  Patient states that after sleeping, his pain has returned, so he wants to come to [facility] for treatment.  I advised I would notify the physician and give him a call back.  Patient verbalizes understanding.

## 2013-06-13 NOTE — Progress Notes (Signed)
Patient ID: William Jennings, male   DOB: 12-13-1990, 22 y.o.   MRN: [id number] Pt discharged to home; discharge instructions given, explained, and signed; IV removed with no complications noted; pt alert and oriented; ambulatory; prescription and work note given

## 2013-06-13 NOTE — Telephone Encounter (Signed)
Called patient back in regards to his C/O of bilateral leg pain and coming to the [facility] for treatment.  I advised patient that he can come and be seen.  At this time, patient states he can not arrive until 2:30p.m.  I explained to the patient that the center is not open 24hrs anymore and we would be closing at 8:00pm.  Explained that he needed to come as soon as possible so that he could receive adequate treatment for his possible crisis.  Patient states he will try to get here between 12-1pm

## 2013-06-18 NOTE — Discharge Summary (Signed)
Physician Discharge Summary  William JarvisGeovanni D Jennings ZOX:[id number] DOB: [birth year] DOA: 06/13/2013  PCP: Altha HarmMATTHEWS,Stephinie Battisti A., MD  Admit date: 06/13/2013 Discharge date: 06/18/2013  Discharge Diagnoses:  Active Problems:   * No active hospital problems. *   Discharge Condition: Hb SS with crisis  Disposition: Home Follow-up Information   Schedule an appointment as soon as possible for a visit with Rya Rausch A., MD.   Specialty:  Internal Medicine   Contact information:   [address] [place] [address] [phone]       Diet:Regular  Wt Readings from Last 3 Encounters:  06/13/13 145 lb (65.772 kg)  04/02/13 145 lb (65.772 kg)  01/22/13 145 lb (65.772 kg)    History of present illness:  Pt who rarely seeks medical care for acute pain presents after having been to the ED last night for treatment of acute pain. Pt states that he was treated with IV analgesics and had good relief resulting in discharge home this morning. However, Patient states that after sleeping he awoke in pain 7/10 and had no oral analgesics. He called requesting treatment for acute management of pain associated with Sickle cell. His pain is currently 8/10 and localized to back and legs. He describes pain as throbbing in nature and without any palliative or provocative features. He also denies and F/C/N/V/D.   Hospital Course:  Pt was treated with IVF and weight based IV analgesics. He is opiate naive and was started dose at 0.5 mg of dilaudid with rapid re-dosing  Up to 1 mg based on response to therapy. He was also given Toradol IV to treat inflammatory component then  transitioned to oral medications. A the time of discharge, his pain was 3/10. He was discharged home in good condition.  Discharge Exam: Filed Vitals:   06/13/13 1400  BP: 120/73  Pulse: 91  Temp: 98.6 F (37 C)  Resp: 20   Filed Vitals:   06/13/13 1400  BP: 120/73  Pulse: 91  Temp: 98.6 F (37 C)  TempSrc: Oral   Resp: 20  Height: 5\' 8"  (1.727 m)  Weight: 145 lb (65.772 kg)  SpO2: 100%  General: Alert, awake, oriented x3, in no acute distress.  HEENT: [place]/AT PEERL, EOMI, Anicteric  Neck: Trachea midline, no masses, no thyromegal,y no JVD, no carotid bruit. Spasm palpated in region of left shoulder and back.  Heart: Regular rate and rhythm, without murmurs, rubs, gallops.  Lungs: Clear to auscultation, no wheezing or rhonchi noted.  Abdomen: Soft, nontender, nondistended, positive bowel  Neuro: No focal neurological deficits noted cranial nerves II through XII grossly intact.Strength normal in bilateral upper and lower extremities. Grip strength equal in BUE's. Musculoskeletal: No warm swelling or erythema around joints.  Discharge Instructions  Discharge Orders   Future Orders Complete By Expires   Activity as tolerated - No restrictions  As directed    Diet general  As directed        Medication List    STOP taking these medications       morphine 15 MG 12 hr tablet  Commonly known as:  MS CONTIN      TAKE these medications       folic acid 800 MCG tablet  Commonly known as:  FOLVITE  Take 800 mcg by mouth daily.     HYDROcodone-acetaminophen 5-325 MG per tablet  Commonly known as:  NORCO/VICODIN  Take 1 tablet by mouth every 6 (six) hours as needed for moderate pain.  ibuprofen 200 MG tablet  Commonly known as:  ADVIL,MOTRIN  Take 400 mg by mouth every 6 (six) hours as needed for pain.     ondansetron 4 MG tablet  Commonly known as:  ZOFRAN  Take 1 tablet (4 mg total) by mouth every 6 (six) hours.          The results of significant diagnostics from this hospitalization (including imaging, microbiology, ancillary and laboratory) are listed below for reference.     Labs: Basic Metabolic Panel:  Recent Labs Lab 06/12/13 2140  NA 139  K 4.5  CL 100  CO2 25  GLUCOSE 107*  BUN 5*  CREATININE 0.80  CALCIUM 9.7   Liver Function Tests:  Recent Labs Lab  06/12/13 2140  AST 150*  ALT 123*  ALKPHOS 165*  BILITOT 1.6*  PROT 8.8*  ALBUMIN 4.8   No results found for this basename: LIPASE, AMYLASE,  in the last 168 hours No results found for this basename: AMMONIA,  in the last 168 hours CBC:  Recent Labs Lab 06/12/13 2140  WBC 12.3*  HGB 13.0  HCT 35.2*  MCV 74.9*  PLT 149*   Time coordinating discharge: 35 minutes  Signed:  Kaydyn Sayas A.  06/18/2013, 1:15 PM

## 2013-07-02 ENCOUNTER — Telehealth: Payer: Self-pay | Admitting: Internal Medicine

## 2013-07-02 NOTE — Telephone Encounter (Signed)
Contacted patient regarding annual CPE. Patient will call back to schedule.

## 2013-07-21 ENCOUNTER — Emergency Department (HOSPITAL_BASED_OUTPATIENT_CLINIC_OR_DEPARTMENT_OTHER)
Admission: EM | Admit: 2013-07-21 | Discharge: 2013-07-21 | Disposition: A | Discharge status: Home / Self Care | Payer: BC Managed Care – PPO | Attending: Emergency Medicine | Admitting: Emergency Medicine

## 2013-07-21 ENCOUNTER — Encounter (HOSPITAL_BASED_OUTPATIENT_CLINIC_OR_DEPARTMENT_OTHER): Payer: Self-pay | Admitting: Emergency Medicine

## 2013-07-21 DIAGNOSIS — Z79899 Other long term (current) drug therapy: Secondary | ICD-10-CM | POA: Insufficient documentation

## 2013-07-21 DIAGNOSIS — Z87891 Personal history of nicotine dependence: Secondary | ICD-10-CM | POA: Insufficient documentation

## 2013-07-21 DIAGNOSIS — D5700 Hb-SS disease with crisis, unspecified: Secondary | ICD-10-CM | POA: Insufficient documentation

## 2013-07-21 LAB — CBC WITH DIFFERENTIAL/PLATELET
Basophils Absolute: 0 10*3/uL (ref 0.0–0.1)
Basophils Relative: 0 % (ref 0–1)
EOS ABS: 0 10*3/uL (ref 0.0–0.7)
Eosinophils Relative: 0 % (ref 0–5)
HCT: 35.7 % — ABNORMAL LOW (ref 39.0–52.0)
Hemoglobin: 13.2 g/dL (ref 13.0–17.0)
LYMPHS ABS: 2.3 10*3/uL (ref 0.7–4.0)
Lymphocytes Relative: 28 % (ref 12–46)
MCH: 27.6 pg (ref 26.0–34.0)
MCHC: 37 g/dL — ABNORMAL HIGH (ref 30.0–36.0)
MCV: 74.5 fL — ABNORMAL LOW (ref 78.0–100.0)
Monocytes Absolute: 0.6 10*3/uL (ref 0.1–1.0)
Monocytes Relative: 7 % (ref 3–12)
NEUTROS ABS: 5.3 10*3/uL (ref 1.7–7.7)
NEUTROS PCT: 65 % (ref 43–77)
Platelets: 164 10*3/uL (ref 150–400)
RBC: 4.79 MIL/uL (ref 4.22–5.81)
RDW: 15.9 % — ABNORMAL HIGH (ref 11.5–15.5)
WBC: 8.3 10*3/uL (ref 4.0–10.5)

## 2013-07-21 LAB — RETICULOCYTES
RBC.: 4.82 MIL/uL (ref 4.22–5.81)
RETIC CT PCT: 3.8 % — AB (ref 0.4–3.1)
Retic Count, Absolute: 183.2 10*3/uL (ref 19.0–186.0)

## 2013-07-21 LAB — BASIC METABOLIC PANEL
BUN: 6 mg/dL (ref 6–23)
CHLORIDE: 103 meq/L (ref 96–112)
CO2: 23 meq/L (ref 19–32)
Calcium: 9.8 mg/dL (ref 8.4–10.5)
Creatinine, Ser: 0.7 mg/dL (ref 0.50–1.35)
GFR calc Af Amer: >90 mL/min (ref >90)
GFR calc non Af Amer: >90 mL/min (ref >90)
Glucose, Bld: 121 mg/dL — ABNORMAL HIGH (ref 70–99)
Potassium: 4.2 mEq/L (ref 3.7–5.3)
SODIUM: 139 meq/L (ref 137–147)

## 2013-07-21 MED ORDER — HYDROMORPHONE HCL PF 1 MG/ML IJ SOLN
1.0000 mg | Freq: Once | INTRAMUSCULAR | Status: AC
  Administered 2013-07-21: 1 mg via INTRAVENOUS
  Filled 2013-07-21: qty 1

## 2013-07-21 MED ORDER — SODIUM CHLORIDE 0.9 % IV BOLUS (SEPSIS)
1000.0000 mL | Freq: Once | INTRAVENOUS | Status: AC
Start: 2013-07-21 — End: 2013-07-21
  Administered 2013-07-21: 1000 mL via INTRAVENOUS

## 2013-07-21 MED ORDER — OXYCODONE-ACETAMINOPHEN 5-325 MG PO TABS: 2.0000 | ORAL_TABLET | ORAL | Status: DC | PRN

## 2013-07-21 MED ORDER — ONDANSETRON HCL 4 MG/2ML IJ SOLN
4.0000 mg | Freq: Once | INTRAMUSCULAR | Status: AC
  Administered 2013-07-21: 4 mg via INTRAVENOUS
  Filled 2013-07-21: qty 2

## 2013-07-21 MED ORDER — HYDROMORPHONE HCL PF 2 MG/ML IJ SOLN
2.0000 mg | Freq: Once | INTRAMUSCULAR | Status: AC
  Administered 2013-07-21: 2 mg via INTRAVENOUS
  Filled 2013-07-21: qty 1

## 2013-07-21 NOTE — Discharge Instructions (Signed)
Percocet as prescribed as needed for pain.  Plenty of fluids and get plenty of rest.  Followup with Dr. Ashley RoyaltyMatthews on Monday if your symptoms have not resolved.   Sickle Cell Anemia, Adult Sickle cell anemia is a condition in which red blood cells have an abnormal "sickle" shape. This abnormal shape shortens the cells' life span, which results in a lower than normal concentration of red blood cells in the blood. The sickle shape also causes the cells to clump together and block free blood flow through the blood vessels. As a result, the tissues and organs of the body do not receive enough oxygen. Sickle cell anemia causes organ damage and pain and increases the risk of infection. CAUSES  Sickle cell anemia is a genetic disorder. Those who receive two copies of the gene have the condition, and those who receive one copy have the trait. RISK FACTORS The sickle cell gene is most common in people whose families originated in [place]. Other areas of the globe where sickle cell trait occurs include the [place], [place], the [place], and the [place].  SIGNS AND SYMPTOMS  Pain, especially in the extremities, back, chest, or abdomen (common). The pain may start suddenly or may develop following an illness, especially if there is dehydration. Pain can also occur due to overexertion or exposure to extreme temperature changes.  Frequent severe bacterial infections, especially certain types of pneumonia and meningitis.  Pain and swelling in the hands and feet.  Decreased activity.   Loss of appetite.   Change in behavior.  Headaches.  Seizures.  Shortness of breath or difficulty breathing.  Vision changes.  Skin ulcers. Those with the trait may not have symptoms or they may have mild symptoms.  DIAGNOSIS  Sickle cell anemia is diagnosed with blood tests that demonstrate the genetic trait. It is often diagnosed during the newborn period, due to mandatory testing  nationwide. A variety of blood tests, X-rays, CT scans, MRI scans, ultrasounds, and lung function tests may also be done to monitor the condition. TREATMENT  Sickle cell anemia may be treated with:  Medicines. You may be given pain medicines, antibiotic medicines (to treat and prevent infections) or medicines to increase the production of certain types of hemoglobin.  Fluids.  Oxygen.  Blood transfusions. HOME CARE INSTRUCTIONS   Drink enough fluid to keep your urine clear or pale yellow. Increase your fluid intake in hot weather and during exercise.  Do not smoke. Smoking lowers oxygen levels in the blood.   Only take over-the-counter or prescription medicines for pain, fever, or discomfort as directed by your health care provider.  Take antibiotics as directed by your health care provider. Make sure you finish them it even if you start to feel better.   Take supplements as directed by your health care provider.   Consider wearing a medical alert bracelet. This tells anyone caring for you in an emergency of your condition.   When traveling, keep your medical information, health care provider's names, and the medicines you take with you at all times.   If you develop a fever, do not take medicines to reduce the fever right away. This could cover up a problem that is developing. Notify your health care provider.  Keep all follow-up appointments with your health care provider. Sickle cell anemia requires regular medical care. SEEK MEDICAL CARE IF: You have a fever. SEEK IMMEDIATE MEDICAL CARE IF:   You feel dizzy or faint.   You have new  abdominal pain, especially on the left side near the stomach area.   You develop a persistent, often uncomfortable and painful penile erection (priapism). If this is not treated immediately it will lead to impotence.   You have numbness your arms or legs or you have a hard time moving them.   You have a hard time with speech.    You have a fever or persistent symptoms for more than 2 3 days.   You have a fever and your symptoms suddenly get worse.   You have signs or symptoms of infection. These include:   Chills.   Abnormal tiredness (lethargy).   Irritability.   Poor eating.   Vomiting.   You develop pain that is not helped with medicine.   You develop shortness of breath.  You have pain in your chest.   You are coughing up pus-like or bloody sputum.   You develop a stiff neck.  Your feet or hands swell or have pain.  Your abdomen appears bloated.  You develop joint pain. MAKE SURE YOU:  Understand these instructions.  Will watch your child's condition.  Will get help right away if your child is not doing well or gets worse. Document Released: 09/08/2005 Document Revised: 03/21/2013 Document Reviewed: 01/10/2013 [facility] Patient Information 2014 [place], [place].

## 2013-07-21 NOTE — ED Provider Notes (Signed)
CSN: [id number]     Arrival date & time 07/21/13  4098 History   First MD Initiated Contact with Patient 07/21/13 [phone]     Chief Complaint  Patient presents with  . Arm Pain  . Leg Pain   (Consider location/radiation/quality/duration/timing/severity/associated sxs/prior Treatment) HPI Comments: Patient is a 23 year old male with history of sickle cell disease. He presents today with complaints of pain in the left arm and right leg that he states are similar to his sickle crises. He has had no relief with his pain medication which she takes at home. He denies to me he is having any chest pain, shortness of breath, fever.  Patient is a 23 y.o. male presenting with sickle cell pain. The history is provided by the patient.  Sickle Cell Pain Crisis Pain location: Left arm and right leg. Severity:  Severe Onset quality:  Gradual Duration:  2 days Similar to previous crisis episodes: yes   Timing:  Constant Progression:  Worsening Chronicity:  Recurrent History of pulmonary emboli: no   Context: not change in medication, not cold exposure and not dehydration   Relieved by:  Nothing Worsened by:  Activity and movement Ineffective treatments:  Prescription drugs Associated symptoms: no chest pain, no congestion, no cough, no fever and no shortness of breath     Past Medical History  Diagnosis Date  . Sickle cell disease    Past Surgical History  Procedure Laterality Date  . Cholecystectomy     No family history on file. History  Substance Use Topics  . Smoking status: Former Games developer  . Smokeless tobacco: Never Used  . Alcohol Use: Yes     Comment: occasionally    Review of Systems  Constitutional: Negative for fever.  HENT: Negative for congestion.   Respiratory: Negative for cough and shortness of breath.   Cardiovascular: Negative for chest pain.  All other systems reviewed and are negative.    Allergies  Shellfish allergy  Home Medications   Current Outpatient Rx   Name  Route  Sig  Dispense  Refill  . folic acid (FOLVITE) 800 MCG tablet   Oral   Take 800 mcg by mouth daily.         Marland Kitchen HYDROcodone-acetaminophen (NORCO/VICODIN) 5-325 MG per tablet   Oral   Take 1 tablet by mouth every 6 (six) hours as needed for moderate pain.   30 tablet   0   . ibuprofen (ADVIL,MOTRIN) 200 MG tablet   Oral   Take 400 mg by mouth every 6 (six) hours as needed for pain.          BP 102/72  Pulse 52  Temp(Src) 98.1 F (36.7 C) (Oral)  Resp 16  Ht 5\' 8"  (1.727 m)  Wt 145 lb (65.772 kg)  BMI 22.05 kg/m2  SpO2 100% Physical Exam  Nursing note and vitals reviewed. Constitutional: He is oriented to person, place, and time. He appears well-developed and well-nourished. No distress.  HENT:  Head: Normocephalic and atraumatic.  Mouth/Throat: Oropharynx is clear and moist.  Neck: Normal range of motion. Neck supple.  Cardiovascular: Normal rate, regular rhythm and normal heart sounds.   No murmur heard. Pulmonary/Chest: Effort normal and breath sounds normal. No respiratory distress. He has no wheezes. He has no rales.  Abdominal: Soft. Bowel sounds are normal. He exhibits no distension. There is no tenderness.  Musculoskeletal: Normal range of motion. He exhibits no edema.  All extremities appear grossly normal and are symmetrical. Peripheral pulses are all  easily palpable and extremities appear well perfused. There is no swelling and no edema.  Neurological: He is alert and oriented to person, place, and time.  Skin: Skin is warm and dry. He is not diaphoretic.    ED Course  Procedures (including critical care time) Labs Review Labs Reviewed  CBC WITH DIFFERENTIAL  BASIC METABOLIC PANEL  RETICULOCYTES   Imaging Review No results found.    MDM  No diagnosis found. Patient presents with complaints of arm and leg pain that he states are consistent with his sickle cell crises. The patient appears to be in minimal discomfort. His heart rate is 50  and blood pressure is not elevated. There are no signs or symptoms of acute chest and his laboratory studies are reassuring. He is feeling better with the medications given and is requesting pain medicine to take at home. I will prescribe him a limited number of Percocet and advised him to followup with Dr. Ashley RoyaltyMatthews on Monday if his symptoms persist.    Geoffery Lyonsouglas Jossiah Smoak, MD 07/21/13 [phone]

## 2013-07-21 NOTE — ED Notes (Signed)
Pt states he is having a sickle cell crisis for two days.  Having left arm and right leg pain.  No sob.

## 2013-07-23 ENCOUNTER — Telehealth (HOSPITAL_COMMUNITY): Payer: Self-pay | Admitting: Hematology

## 2013-07-23 NOTE — Telephone Encounter (Signed)
Pt called stating that he was in sickle cell crisis. Stating that he was having a 8/10 pain level to right leg and left arm. Pt states that he has taken his PO  dialudid at 0830hrs and ibuprofen as well with any relief.  Denies any fever, chest pain, nausea, vomiting, diarrhea abdo pain or priapism.  Pt states that he will not be able to come to the clinic until after 4pm today as he has care of his young children. Will notify MD on call.

## 2013-07-23 NOTE — Telephone Encounter (Signed)
Dr Laural BenesJohnson notified of pt's status with pain 8/10 to arm and leg.  Upon assessment of medications and discussion with Dr Laural BenesJohnson, pt called and told that he can double up on his norco to 2 tabs every 6 hours PRN for pain, since pt could not come to the clinic until after 4pm.  Pt told to arrange for childcare for tomorrow and to call us in the am if he is still in pain.  If oral pain meds can not hold pt's pain level, advised to go to the ED.  Pt states that he understands

## 2013-07-24 ENCOUNTER — Telehealth (HOSPITAL_COMMUNITY): Payer: Self-pay | Admitting: *Deleted

## 2013-07-24 ENCOUNTER — Non-Acute Institutional Stay (HOSPITAL_COMMUNITY)
Admission: AD | Admit: 2013-07-24 | Discharge: 2013-07-24 | Disposition: A | Discharge status: Home / Self Care | Payer: BC Managed Care – PPO | Attending: Internal Medicine | Admitting: Internal Medicine

## 2013-07-24 ENCOUNTER — Telehealth (HOSPITAL_COMMUNITY): Payer: Self-pay | Admitting: Internal Medicine

## 2013-07-24 ENCOUNTER — Encounter (HOSPITAL_COMMUNITY): Payer: Self-pay | Admitting: *Deleted

## 2013-07-24 DIAGNOSIS — D57219 Sickle-cell/Hb-C disease with crisis, unspecified: Secondary | ICD-10-CM

## 2013-07-24 DIAGNOSIS — D571 Sickle-cell disease without crisis: Secondary | ICD-10-CM | POA: Insufficient documentation

## 2013-07-24 DIAGNOSIS — M79609 Pain in unspecified limb: Secondary | ICD-10-CM | POA: Insufficient documentation

## 2013-07-24 LAB — CBC WITH DIFFERENTIAL/PLATELET
BASOS ABS: 0 10*3/uL (ref 0.0–0.1)
Basophils Relative: 0 % (ref 0–1)
Eosinophils Absolute: 0.1 10*3/uL (ref 0.0–0.7)
Eosinophils Relative: 1 % (ref 0–5)
HCT: 36.3 % — ABNORMAL LOW (ref 39.0–52.0)
Hemoglobin: 13.4 g/dL (ref 13.0–17.0)
LYMPHS ABS: 2.4 10*3/uL (ref 0.7–4.0)
LYMPHS PCT: 26 % (ref 12–46)
MCH: 27.4 pg (ref 26.0–34.0)
MCHC: 36.9 g/dL — ABNORMAL HIGH (ref 30.0–36.0)
MCV: 74.2 fL — ABNORMAL LOW (ref 78.0–100.0)
Monocytes Absolute: 1 10*3/uL (ref 0.1–1.0)
Monocytes Relative: 11 % (ref 3–12)
NEUTROS PCT: 62 % (ref 43–77)
Neutro Abs: 5.7 10*3/uL (ref 1.7–7.7)
PLATELETS: 138 10*3/uL — AB (ref 150–400)
RBC: 4.89 MIL/uL (ref 4.22–5.81)
RDW: 16.1 % — ABNORMAL HIGH (ref 11.5–15.5)
WBC: 9.3 10*3/uL (ref 4.0–10.5)

## 2013-07-24 MED ORDER — KETOROLAC TROMETHAMINE 30 MG/ML IJ SOLN
30.0000 mg | Freq: Once | INTRAMUSCULAR | Status: AC
  Administered 2013-07-24: 30 mg via INTRAVENOUS
  Filled 2013-07-24: qty 1

## 2013-07-24 MED ORDER — HYDROMORPHONE HCL PF 2 MG/ML IJ SOLN
2.0000 mg | Freq: Once | INTRAMUSCULAR | Status: AC
  Administered 2013-07-24: 2 mg via INTRAVENOUS
  Filled 2013-07-24: qty 1

## 2013-07-24 MED ORDER — HYDROCODONE-ACETAMINOPHEN 5-325 MG PO TABS: 1.0000 | ORAL_TABLET | Freq: Four times a day (QID) | ORAL | Status: DC | PRN

## 2013-07-24 MED ORDER — ONDANSETRON HCL 4 MG/2ML IJ SOLN
4.0000 mg | Freq: Once | INTRAMUSCULAR | Status: AC
  Administered 2013-07-24: 4 mg via INTRAVENOUS
  Filled 2013-07-24: qty 2

## 2013-07-24 MED ORDER — HYDROCODONE-ACETAMINOPHEN 5-325 MG PO TABS
1.0000 | ORAL_TABLET | Freq: Once | ORAL | Status: AC
  Administered 2013-07-24: 1 via ORAL
  Filled 2013-07-24: qty 1

## 2013-07-24 MED ORDER — HYDROMORPHONE HCL PF 2 MG/ML IJ SOLN
1.6000 mg | Freq: Once | INTRAMUSCULAR | Status: AC
  Administered 2013-07-24: 1.6 mg via INTRAVENOUS
  Filled 2013-07-24: qty 1

## 2013-07-24 MED ORDER — DEXTROSE-NACL 5-0.45 % IV SOLN
INTRAVENOUS | Status: DC
  Administered 2013-07-24: 12:00:00 via INTRAVENOUS

## 2013-07-24 NOTE — Progress Notes (Signed)
Patient ID: William JarvisGeovanni D Brakebill, male   DOB: 16-Sep-1990, 23 y.o.   MRN: [id number] Pt discharged to home; IV removed with no complications noted; discharge instructions given; all questions answered; pt alert and oriented upon discharge

## 2013-07-24 NOTE — Telephone Encounter (Signed)
Prescription refilled. Patient to schedule an appointment for a complete physical examination

## 2013-07-24 NOTE — Telephone Encounter (Signed)
Pt states has pain in arms and legs; rates pain 7/10; no relief with home meds; notified MD and pt [facility]; pt verbalizes understanding

## 2013-07-24 NOTE — Progress Notes (Signed)
Patient rating pain 5/10 and appears to be resting comfortably. NP notified. NP acknowledged.

## 2013-07-24 NOTE — Discharge Summary (Signed)
[facility] Discharge Summary   Patient ID: William Jennings MRN: [id number] DOB/AGE: 1990/12/24 23 y.o.  Admit date: 07/24/2013 Discharge date: 07/24/2013  Primary Care Physician:  MATTHEWS,MICHELLE A., MD  Admission Diagnoses:  Sickle cell anemia with pain  Discharge Diagnoses:   Sickle cell anemia  Physician Discharge Summary  William Jennings WUJ:[id number] DOB: 12-21-1990 DOA: 07/24/2013  PCP: MATTHEWS,MICHELLE A., MD  Admit date: 07/24/2013 Discharge date: 07/24/2013  Discharge Diagnoses:  Active Problems:  Sickle cell anemia with pain   Discharge Condition: stable  Disposition:  Home  Diet:Regular Wt Readings from Last 3 Encounters:  07/21/13 145 lb (65.772 kg)  06/13/13 145 lb (65.772 kg)  04/02/13 145 lb (65.772 kg)    History of present illness:   Patient that rarely seeks care for acute pain crisis seeks care after going to [facility] on 07/21/2013. Patient was treated with IV analgesics and IV fluids and was discharged home later that night with a new prescription for Percocet 5-325 mg. Patient states that he did not fill the prescription because he wanted to contact his primary provider before starting a new pain medication. Patient states that he awakened with 8/10 pain localized to right upper thigh on 07/22/2013 and took Hydrocodone as previously prescribed by primary provider. Patient reports that he has been experiencing 8/10 pain in the right leg since Thursday, July 19, 2013. States that he has been taking Ibuprofen and Hydrocodone with minimal relief. [facility] . Patient states that he has not been maintaining hydration, but denies nausea, vomiting, or diarrhea.     Discharge Exam:  Filed Vitals:   07/24/13 1152  BP: 135/77  Pulse: 63  Temp: 97.9 F (36.6 C)  Resp: 18   Filed Vitals:   07/24/13 1152  BP: 135/77  Pulse: 63  Temp: 97.9 F (36.6 C)  TempSrc: Oral  Resp: 18  SpO2: 100%   BP 112/58   Pulse 78  Temp(Src) 97.8 F (36.6 C) (Oral)  Resp 18  SpO2 100%  General Appearance:    Alert, cooperative, no distress, appears stated age  Head:    Normocephalic, without obvious abnormality, atraumatic  Eyes:    PERRL, conjunctiva/corneas clear, EOM's intact, fundi    benign, both eyes       Ears:    Normal TM's and external ear canals, both ears  Nose:   Nares normal, septum midline, mucosa normal, no drainage    or sinus tenderness  Throat:   Lips, mucosa, and tongue normal; teeth and gums normal  Neck:   Supple, symmetrical, trachea midline, no adenopathy;       thyroid:  No enlargement/tenderness/nodules; no carotid   bruit or JVD  Back:     Symmetric, no curvature, ROM normal, no CVA tenderness  Lungs:     Clear to auscultation bilaterally, respirations unlabored  Chest wall:    No tenderness or deformity  Heart:    Regular rate and rhythm, S1 and S2 normal, no murmur, rub   or gallop  Abdomen:     Soft, non-tender, bowel sounds active all four quadrants,    no masses, no organomegaly  Genitalia:    Normal male without lesion, discharge or tenderness  Rectal:    Normal tone, normal prostate, no masses or tenderness;   guaiac negative stool  Extremities:   Mild tenderness to right lateral thigh, atraumatic, no cyanosis or edema  Pulses:   2+ and symmetric all extremities  Skin:  Skin color, texture, turgor normal, no rashes or lesions  Lymph nodes:   Cervical, supraclavicular, and axillary nodes normal  Neurologic:   CNII-XII intact. Normal strength, sensation and reflexes      throughout   Discharge Instructions     Medication List    ASK your doctor about these medications       folic acid 800 MCG tablet  Commonly known as:  FOLVITE  Take 800 mcg by mouth daily.     HYDROcodone-acetaminophen 5-325 MG per tablet  Commonly known as:  NORCO/VICODIN  Take 1 tablet by mouth every 6 (six) hours as needed for moderate pain.     ibuprofen 200 MG tablet  Commonly  known as:  ADVIL,MOTRIN  Take 400 mg by mouth every 6 (six) hours as needed for pain.     oxyCODONE-acetaminophen 5-325 MG per tablet  Commonly known as:  PERCOCET  Take 2 tablets by mouth every 4 (four) hours as needed.          The results of significant diagnostics from this hospitalization (including imaging, microbiology, ancillary and laboratory) are listed below for reference.    Significant Diagnostic Studies: No results found.  Microbiology: No results found for this or any previous visit (from the past 240 hour(s)).   Labs: Basic Metabolic Panel:  Recent Labs Lab 07/21/13 0930  NA 139  K 4.2  CL 103  CO2 23  GLUCOSE 121*  BUN 6  CREATININE 0.70  CALCIUM 9.8   Liver Function Tests: No results found for this basename: AST, ALT, ALKPHOS, BILITOT, PROT, ALBUMIN,  in the last 168 hours No results found for this basename: LIPASE, AMYLASE,  in the last 168 hours No results found for this basename: AMMONIA,  in the last 168 hours CBC:  Recent Labs Lab 07/21/13 0930 07/24/13 1220  WBC 8.3 9.3  NEUTROABS 5.3 5.7  HGB 13.2 13.4  HCT 35.7* 36.3*  MCV 74.5* 74.2*  PLT 164 138*   Cardiac Enzymes: No results found for this basename: CKTOTAL, CKMB, CKMBINDEX, TROPONINI,  in the last 168 hours BNP: No components found with this basename: POCBNP,  CBG: No results found for this basename: GLUCAP,  in the last 168 hours Ferritin: No results found for this basename: FERRITIN,  in the last 168 hours  Time coordinating discharge: 45  Signed:  Jery Hollern M  07/24/2013, 4:32 PM   Discharge Medications:    Medication List    ASK your doctor about these medications       folic acid 800 MCG tablet  Commonly known as:  FOLVITE  Take 800 mcg by mouth daily.     HYDROcodone-acetaminophen 5-325 MG per tablet  Commonly known as:  NORCO/VICODIN  Take 1 tablet by mouth every 6 (six) hours as needed for moderate pain.     ibuprofen 200 MG tablet  Commonly  known as:  ADVIL,MOTRIN  Take 400 mg by mouth every 6 (six) hours as needed for pain.     oxyCODONE-acetaminophen 5-325 MG per tablet  Commonly known as:  PERCOCET  Take 2 tablets by mouth every 4 (four) hours as needed.         Consults:  None  Significant Diagnostic Studies:  None   [facility] Course: Pt was treated with IVF, Toradol and IV analgesics. Pt was initially treated Dilaudid IV per weight based rapid re-dosing protocol. Oral home medication Hydrocodone-acetaminophen 5-325 prior to discharge. Pain has decreased from 9/10 to 4/10. Pt will be  discharged home on previously prescribed home medications. Patient will schedule follow-up in office as work schedule permits.    Disposition at Discharge: 01-Home or Self Care  Condition at Discharge:   Stable  Time spent on Discharge:  Greater than 30 minutes.  Signed: Christin Mccreedy M 07/24/2013, 4:31 PM

## 2013-07-24 NOTE — Progress Notes (Signed)
Patient ambulated, reports pain maintains 4/10 with ambulating. Patient states he is ready to be discharged.  Patient reports feeling hot, nauseous, "whoozy" when ambulated. BP 89/73, HR 89. NP notified. NP to enter orders,  To give IV zofran and continue to monitor for an additional 30 minutes.

## 2013-07-24 NOTE — Discharge Instructions (Signed)
Sickle Cell Anemia, Adult °Sickle cell anemia is a condition in which red blood cells have an abnormal "sickle" shape. This abnormal shape shortens the cells' life span, which results in a lower than normal concentration of red blood cells in the blood. The sickle shape also causes the cells to clump together and block free blood flow through the blood vessels. As a result, the tissues and organs of the body do not receive enough oxygen. Sickle cell anemia causes organ damage and pain and increases the risk of infection. °CAUSES  °Sickle cell anemia is a genetic disorder. Those who receive two copies of the gene have the condition, and those who receive one copy have the trait. °RISK FACTORS °The sickle cell gene is most common in people whose families originated in [place]. Other areas of the globe where sickle cell trait occurs include the Mediterranean, [place] and [place], the [place], and the [place].  °SIGNS AND SYMPTOMS °· Pain, especially in the extremities, back, chest, or abdomen (common). The pain may start suddenly or may develop following an illness, especially if there is dehydration. Pain can also occur due to overexertion or exposure to extreme temperature changes. °· Frequent severe bacterial infections, especially certain types of pneumonia and meningitis. °· Pain and swelling in the hands and feet. °· Decreased activity.   °· Loss of appetite.   °· Change in behavior. °· Headaches. °· Seizures. °· Shortness of breath or difficulty breathing. °· Vision changes. °· Skin ulcers. °Those with the trait may not have symptoms or they may have mild symptoms.  °DIAGNOSIS  °Sickle cell anemia is diagnosed with blood tests that demonstrate the genetic trait. It is often diagnosed during the newborn period, due to mandatory testing nationwide. A variety of blood tests, X-rays, CT scans, MRI scans, ultrasounds, and lung function tests may also be done to monitor the condition. °TREATMENT  °Sickle  cell anemia may be treated with: °· Medicines. You may be given pain medicines, antibiotic medicines (to treat and prevent infections) or medicines to increase the production of certain types of hemoglobin. °· Fluids. °· Oxygen. °· Blood transfusions. °HOME CARE INSTRUCTIONS  °· Drink enough fluid to keep your urine clear or pale yellow. Increase your fluid intake in hot weather and during exercise. °· Do not smoke. Smoking lowers oxygen levels in the blood.   °· Only take over-the-counter or prescription medicines for pain, fever, or discomfort as directed by your health care provider. °· Take antibiotics as directed by your health care provider. Make sure you finish them it even if you start to feel better.   °· Take supplements as directed by your health care provider.   °· Consider wearing a medical alert bracelet. This tells anyone caring for you in an emergency of your condition.   °· When traveling, keep your medical information, health care provider's names, and the medicines you take with you at all times.   °· If you develop a fever, do not take medicines to reduce the fever right away. This could cover up a problem that is developing. Notify your health care provider. °· Keep all follow-up appointments with your health care provider. Sickle cell anemia requires regular medical care. °SEEK MEDICAL CARE IF: ° You have a fever. °SEEK IMMEDIATE MEDICAL CARE IF:  °· You feel dizzy or faint.   °· You have new abdominal pain, especially on the left side near the stomach area.   °· You develop a persistent, often uncomfortable and painful penile erection (priapism). If this is not treated immediately it   will lead to impotence.   °· You have numbness your arms or legs or you have a hard time moving them.   °· You have a hard time with speech.   °· You have a fever or persistent symptoms for more than 2 3 days.   °· You have a fever and your symptoms suddenly get worse.   °· You have signs or symptoms of infection.  These include:   °· Chills.   °· Abnormal tiredness (lethargy).   °· Irritability.   °· Poor eating.   °· Vomiting.   °· You develop pain that is not helped with medicine.   °· You develop shortness of breath. °· You have pain in your chest.   °· You are coughing up pus-like or bloody sputum.   °· You develop a stiff neck. °· Your feet or hands swell or have pain. °· Your abdomen appears bloated. °· You develop joint pain. °MAKE SURE YOU: °· Understand these instructions. °· Will watch your child's condition. °· Will get help right away if your child is not doing well or gets worse. °Document Released: 09/08/2005 Document Revised: 03/21/2013 Document Reviewed: 01/10/2013 °Exit[employer]® Patient Information ©2014 [employer]. ° °

## 2013-07-24 NOTE — H&P (Signed)
[facility] History and Physical   Date: 07/24/2013  Patient name: William Jennings Medical record number: [id number] Date of birth: 1991-01-30 Age: 23 y.o. Gender: male PCP: MATTHEWS,MICHELLE A., MD  Attending physician: Cleora Fleet, MD  Chief Complaint:Sickle Cell crisis  History of Present Illness: Patient that rarely seeks care for acute pain crisis seeks care after going to [facility] on 07/21/2013. Patient was treated with IV analgesics and IV fluids and was discharged home later that night with a new prescription for Percocet 5-325 mg. Patient states that he did not fill the prescription because he wanted to contact his primary provider before starting a new pain medication.  Patient states that he awakened with 8/10 pain localized to right upper thigh on 07/22/2013 and took Hydrocodone as previously prescribed by primary provider. Patient reports that he has  been experiencing 8/10 pain in the right leg since Thursday, July 19, 2013. States that he has been taking Ibuprofen and Hydrocodone with minimal relief.    [facility] . Patient states that he has not been maintaining hydration, but denies nausea, vomiting, or diarrhea.   Meds: Prescriptions prior to admission  Medication Sig Dispense Refill  . folic acid (FOLVITE) 800 MCG tablet Take 800 mcg by mouth daily.      Marland Kitchen HYDROcodone-acetaminophen (NORCO/VICODIN) 5-325 MG per tablet Take 1 tablet by mouth every 6 (six) hours as needed for moderate pain.  30 tablet  0  . ibuprofen (ADVIL,MOTRIN) 200 MG tablet Take 400 mg by mouth every 6 (six) hours as needed for pain.      Marland Kitchen oxyCODONE-acetaminophen (PERCOCET) 5-325 MG per tablet Take 2 tablets by mouth every 4 (four) hours as needed.  10 tablet  0    Allergies: Shellfish allergy Past Medical History  Diagnosis Date  . Sickle cell disease    Past Surgical History  Procedure Laterality Date  . Cholecystectomy     History  reviewed. No pertinent family history. History   Social History  . Marital Status: Single    Spouse Name: N/A    Number of Children: N/A  . Years of Education: N/A   Occupational History  . Not on file.   Social History Main Topics  . Smoking status: Former Games developer  . Smokeless tobacco: Never Used  . Alcohol Use: Yes     Comment: occasionally  . Drug Use: No  . Sexual Activity: No   Other Topics Concern  . Not on file   Social History Narrative  . No narrative on file    Review of Systems:  Constitutional: No weight loss, night sweats, Fevers, chills, fatigue.  HEENT: Mouth appears dry mild leukoplakia to tongue.  No headaches, dizziness, seizures, vision changes, difficulty swallowing,Tooth/dental problems,Sore throat, No sneezing, itching, ear ache, nasal congestion, post nasal drip,  Cardio-vascular: No chest pain, Orthopnea, PND, swelling in lower extremities, anasarca, dizziness, palpitations  GI: No heartburn, indigestion, abdominal pain, nausea, vomiting, diarrhea, change in bowel habits, loss of appetite  Resp: No shortness of breath with exertion or at rest. No excess mucus, no productive cough, No non-productive cough, No coughing up of blood.No change in color of mucus.No wheezing.No chest wall deformity  Skin: no rash or lesions.  GU: no dysuria, change in color of urine, no urgency or frequency. No flank pain.  Psych: No change in mood or affect. No depression or anxiety. No memory loss Musculoskeletal: Increased tenderness to palpation along right lateral quadriceps. No localized  edema.   Physical Exam: Blood pressure 135/77, pulse 63, temperature 97.9 F (36.6 C), temperature source Oral, resp. rate 18, SpO2 100.00%.  BP 135/77  Pulse 63  Temp(Src) 97.9 F (36.6 C) (Oral)  Resp 18  SpO2 100%  General Appearance:    Alert, cooperative, no distress, appears stated age  Head:    Normocephalic, without obvious abnormality, atraumatic  Eyes:    PERRL,  conjunctiva/corneas clear, EOM's intact, fundi    benign, both eyes       Ears:    Normal TM's and external ear canals, both ears  Nose:   Nares normal, septum midline, mucosa normal, no drainage    or sinus tenderness  Throat:   Lips, mucosa, and tongue normal; teeth and gums normal  Neck:   Supple, symmetrical, trachea midline, no adenopathy;       thyroid:  No enlargement/tenderness/nodules; no carotid   bruit or JVD  Back:     Symmetric, no curvature, ROM normal, no CVA tenderness  Lungs:     Clear to auscultation bilaterally, respirations unlabored  Chest wall:    No tenderness or deformity  Heart:    Regular rate and rhythm, S1 and S2 normal, no murmur, rub   or gallop  Abdomen:     Soft, non-tender, bowel sounds active all four quadrants,    no masses, no organomegaly  Genitalia:    Normal male without lesion, discharge or tenderness  Rectal:    Normal tone, normal prostate, no masses or tenderness;   guaiac negative stool  Extremities:   Extremities normal, atraumatic, no cyanosis or edema  Pulses:   2+ and symmetric all extremities  Skin:   Skin color, texture, turgor normal, no rashes or lesions  Lymph nodes:   Cervical, supraclavicular, and axillary nodes normal  Neurologic:   CNII-XII intact. Normal strength, sensation and reflexes      throughout    Lab results: No results found for this or any previous visit (from the past 24 hour(s)).  Imaging results:  No results found.   Assessment & Plan: 1. Sickle cell pain crisis: Start IV fluids, Dilaudid per weight based rapid re-dosing, and IV Toradol .   Mayana Irigoyen M 07/24/2013, 12:01 PM

## 2013-08-05 ENCOUNTER — Emergency Department (HOSPITAL_BASED_OUTPATIENT_CLINIC_OR_DEPARTMENT_OTHER)
Admission: EM | Admit: 2013-08-05 | Discharge: 2013-08-05 | Disposition: A | Discharge status: Home / Self Care | Payer: BC Managed Care – PPO | Attending: Emergency Medicine | Admitting: Emergency Medicine

## 2013-08-05 ENCOUNTER — Emergency Department (HOSPITAL_BASED_OUTPATIENT_CLINIC_OR_DEPARTMENT_OTHER): Payer: BC Managed Care – PPO

## 2013-08-05 ENCOUNTER — Encounter (HOSPITAL_BASED_OUTPATIENT_CLINIC_OR_DEPARTMENT_OTHER): Payer: Self-pay | Admitting: Emergency Medicine

## 2013-08-05 DIAGNOSIS — D5700 Hb-SS disease with crisis, unspecified: Secondary | ICD-10-CM | POA: Insufficient documentation

## 2013-08-05 DIAGNOSIS — D649 Anemia, unspecified: Secondary | ICD-10-CM | POA: Insufficient documentation

## 2013-08-05 DIAGNOSIS — Z87891 Personal history of nicotine dependence: Secondary | ICD-10-CM | POA: Insufficient documentation

## 2013-08-05 DIAGNOSIS — Z79899 Other long term (current) drug therapy: Secondary | ICD-10-CM | POA: Insufficient documentation

## 2013-08-05 LAB — BASIC METABOLIC PANEL
BUN: 6 mg/dL (ref 6–23)
CALCIUM: 9.6 mg/dL (ref 8.4–10.5)
CO2: 24 meq/L (ref 19–32)
Chloride: 104 mEq/L (ref 96–112)
Creatinine, Ser: 0.9 mg/dL (ref 0.50–1.35)
GFR calc Af Amer: >90 mL/min (ref >90)
GFR calc non Af Amer: >90 mL/min (ref >90)
GLUCOSE: 105 mg/dL — AB (ref 70–99)
POTASSIUM: 4.2 meq/L (ref 3.7–5.3)
SODIUM: 142 meq/L (ref 137–147)

## 2013-08-05 LAB — CBC WITH DIFFERENTIAL/PLATELET
BASOS ABS: 0 10*3/uL (ref 0.0–0.1)
BASOS PCT: 0 % (ref 0–1)
Band Neutrophils: 2 % (ref 0–10)
Blasts: 0 %
EOS ABS: 0 10*3/uL (ref 0.0–0.7)
EOS PCT: 0 % (ref 0–5)
HCT: 29.7 % — ABNORMAL LOW (ref 39.0–52.0)
HEMOGLOBIN: 10.9 g/dL — AB (ref 13.0–17.0)
Lymphocytes Relative: 18 % (ref 12–46)
Lymphs Abs: 2.3 10*3/uL (ref 0.7–4.0)
MCH: 27.5 pg (ref 26.0–34.0)
MCHC: 36.7 g/dL — ABNORMAL HIGH (ref 30.0–36.0)
MCV: 75 fL — ABNORMAL LOW (ref 78.0–100.0)
METAMYELOCYTES PCT: 2 %
MONO ABS: 1.6 10*3/uL — AB (ref 0.1–1.0)
MONOS PCT: 12 % (ref 3–12)
Myelocytes: 2 %
NRBC: 0 /100{WBCs}
Neutro Abs: 9.1 10*3/uL — ABNORMAL HIGH (ref 1.7–7.7)
Neutrophils Relative %: 64 % (ref 43–77)
PLATELETS: 155 10*3/uL (ref 150–400)
Promyelocytes Absolute: 0 %
RBC: 3.96 MIL/uL — AB (ref 4.22–5.81)
RDW: 16.8 % — ABNORMAL HIGH (ref 11.5–15.5)
WBC: 13 10*3/uL — AB (ref 4.0–10.5)

## 2013-08-05 LAB — RETICULOCYTES
RBC.: 3.95 MIL/uL — ABNORMAL LOW (ref 4.22–5.81)
Retic Count, Absolute: 284.4 10*3/uL — ABNORMAL HIGH (ref 19.0–186.0)
Retic Ct Pct: 7.2 % — ABNORMAL HIGH (ref 0.4–3.1)

## 2013-08-05 MED ORDER — HYDROMORPHONE HCL PF 2 MG/ML IJ SOLN
2.0000 mg | INTRAMUSCULAR | Status: AC | PRN
  Administered 2013-08-05 (×2): 2 mg via INTRAVENOUS
  Filled 2013-08-05 (×2): qty 1

## 2013-08-05 MED ORDER — HYDROCODONE-ACETAMINOPHEN 5-325 MG PO TABS: 1.0000 | ORAL_TABLET | Freq: Four times a day (QID) | ORAL | Status: DC | PRN

## 2013-08-05 MED ORDER — KETOROLAC TROMETHAMINE 30 MG/ML IJ SOLN
30.0000 mg | Freq: Once | INTRAMUSCULAR | Status: AC
  Administered 2013-08-05: 30 mg via INTRAVENOUS
  Filled 2013-08-05: qty 1

## 2013-08-05 MED ORDER — SODIUM CHLORIDE 0.9 % IV BOLUS (SEPSIS)
1000.0000 mL | Freq: Once | INTRAVENOUS | Status: AC
  Administered 2013-08-05: 1000 mL via INTRAVENOUS

## 2013-08-05 MED ORDER — OXYCODONE-ACETAMINOPHEN 5-325 MG PO TABS
1.0000 | ORAL_TABLET | Freq: Once | ORAL | Status: AC
  Administered 2013-08-05: 1 via ORAL
  Filled 2013-08-05: qty 1

## 2013-08-05 MED ORDER — HYDROMORPHONE HCL PF 2 MG/ML IJ SOLN
2.0000 mg | Freq: Once | INTRAMUSCULAR | Status: AC
  Administered 2013-08-05: 2 mg via INTRAVENOUS
  Filled 2013-08-05: qty 1

## 2013-08-05 NOTE — Discharge Instructions (Signed)
Sickle Cell Anemia, Adult °Sickle cell anemia is a condition in which red blood cells have an abnormal "sickle" shape. This abnormal shape shortens the cells' life span, which results in a lower than normal concentration of red blood cells in the blood. The sickle shape also causes the cells to clump together and block free blood flow through the blood vessels. As a result, the tissues and organs of the body do not receive enough oxygen. Sickle cell anemia causes organ damage and pain and increases the risk of infection. °CAUSES  °Sickle cell anemia is a genetic disorder. Those who receive two copies of the gene have the condition, and those who receive one copy have the trait. °RISK FACTORS °The sickle cell gene is most common in people whose families originated in [place]. Other areas of the globe where sickle cell trait occurs include the Mediterranean, [place] and [place], the [place], and the [place].  °SIGNS AND SYMPTOMS °· Pain, especially in the extremities, back, chest, or abdomen (common). The pain may start suddenly or may develop following an illness, especially if there is dehydration. Pain can also occur due to overexertion or exposure to extreme temperature changes. °· Frequent severe bacterial infections, especially certain types of pneumonia and meningitis. °· Pain and swelling in the hands and feet. °· Decreased activity.   °· Loss of appetite.   °· Change in behavior. °· Headaches. °· Seizures. °· Shortness of breath or difficulty breathing. °· Vision changes. °· Skin ulcers. °Those with the trait may not have symptoms or they may have mild symptoms.  °DIAGNOSIS  °Sickle cell anemia is diagnosed with blood tests that demonstrate the genetic trait. It is often diagnosed during the newborn period, due to mandatory testing nationwide. A variety of blood tests, X-rays, CT scans, MRI scans, ultrasounds, and lung function tests may also be done to monitor the condition. °TREATMENT  °Sickle  cell anemia may be treated with: °· Medicines. You may be given pain medicines, antibiotic medicines (to treat and prevent infections) or medicines to increase the production of certain types of hemoglobin. °· Fluids. °· Oxygen. °· Blood transfusions. °HOME CARE INSTRUCTIONS  °· Drink enough fluid to keep your urine clear or pale yellow. Increase your fluid intake in hot weather and during exercise. °· Do not smoke. Smoking lowers oxygen levels in the blood.   °· Only take over-the-counter or prescription medicines for pain, fever, or discomfort as directed by your health care provider. °· Take antibiotics as directed by your health care provider. Make sure you finish them it even if you start to feel better.   °· Take supplements as directed by your health care provider.   °· Consider wearing a medical alert bracelet. This tells anyone caring for you in an emergency of your condition.   °· When traveling, keep your medical information, health care provider's names, and the medicines you take with you at all times.   °· If you develop a fever, do not take medicines to reduce the fever right away. This could cover up a problem that is developing. Notify your health care provider. °· Keep all follow-up appointments with your health care provider. Sickle cell anemia requires regular medical care. °SEEK MEDICAL CARE IF: ° You have a fever. °SEEK IMMEDIATE MEDICAL CARE IF:  °· You feel dizzy or faint.   °· You have new abdominal pain, especially on the left side near the stomach area.   °· You develop a persistent, often uncomfortable and painful penile erection (priapism). If this is not treated immediately it   will lead to impotence.   °· You have numbness your arms or legs or you have a hard time moving them.   °· You have a hard time with speech.   °· You have a fever or persistent symptoms for more than 2 3 days.   °· You have a fever and your symptoms suddenly get worse.   °· You have signs or symptoms of infection.  These include:   °· Chills.   °· Abnormal tiredness (lethargy).   °· Irritability.   °· Poor eating.   °· Vomiting.   °· You develop pain that is not helped with medicine.   °· You develop shortness of breath. °· You have pain in your chest.   °· You are coughing up pus-like or bloody sputum.   °· You develop a stiff neck. °· Your feet or hands swell or have pain. °· Your abdomen appears bloated. °· You develop joint pain. °MAKE SURE YOU: °· Understand these instructions. °· Will watch your child's condition. °· Will get help right away if your child is not doing well or gets worse. °Document Released: 09/08/2005 Document Revised: 03/21/2013 Document Reviewed: 01/10/2013 °Exit[employer]® Patient Information ©2014 [employer]. ° °

## 2013-08-05 NOTE — ED Notes (Signed)
Patient here with lower back pain and bilateral leg pain since 0300, reports that this is his sickle cell, was seen in the clinic and doing better until this am

## 2013-08-05 NOTE — ED Provider Notes (Signed)
CSN: [id number]     Arrival date & time 08/05/13  0901 History   First MD Initiated Contact with Patient 08/05/13 [phone]     Chief Complaint  Patient presents with  . Sickle Cell Pain Crisis     (Consider location/radiation/quality/duration/timing/severity/associated sxs/prior Treatment) HPI  23 year old male with history of sickle cell disease who presents with back pain and bilateral leg pain. He reports onset of symptoms at 3:00 this morning. He states he has been doing well and has not had to take pain medication at home. He is out of his hydrocodone has not taken anything for his pain this morning. He rates his pain a 10 out of 10. It is consistent with prior sickle cell crises. He denies any chest pain, fever, or shortness of breath.  Past Medical History  Diagnosis Date  . Sickle cell disease    Past Surgical History  Procedure Laterality Date  . Cholecystectomy     No family history on file. History  Substance Use Topics  . Smoking status: Former Games developer  . Smokeless tobacco: Never Used  . Alcohol Use: Yes     Comment: occasionally    Review of Systems  Constitutional: Negative.  Negative for fever.  Respiratory: Negative.  Negative for chest tightness and shortness of breath.   Cardiovascular: Negative.  Negative for chest pain.  Gastrointestinal: Negative.  Negative for abdominal pain.  Genitourinary: Negative.  Negative for dysuria.  Musculoskeletal: Positive for back pain.       Bilateral leg pain  Skin: Negative for rash.  Neurological: Negative for headaches.  All other systems reviewed and are negative.      Allergies  Shellfish allergy  Home Medications   Current Outpatient Rx  Name  Route  Sig  Dispense  Refill  . HYDROcodone-acetaminophen (NORCO/VICODIN) 5-325 MG per tablet   Oral   Take 1 tablet by mouth every 6 (six) hours as needed for moderate pain.   30 tablet   0   . folic acid (FOLVITE) 800 MCG tablet   Oral   Take 800 mcg by mouth  daily.         Marland Kitchen HYDROcodone-acetaminophen (NORCO/VICODIN) 5-325 MG per tablet   Oral   Take 1 tablet by mouth every 6 (six) hours as needed for severe pain.   6 tablet   0   . ibuprofen (ADVIL,MOTRIN) 200 MG tablet   Oral   Take 400 mg by mouth every 6 (six) hours as needed for pain.          BP 119/82  Pulse 78  Temp(Src) 98.5 F (36.9 C) (Oral)  Resp 22  Ht 5\' 7"  (1.702 m)  Wt 140 lb (63.504 kg)  BMI 21.92 kg/m2  SpO2 100% Physical Exam  Nursing note and vitals reviewed. Constitutional: He is oriented to person, place, and time. No distress.  Uncomfortable appearing  HENT:  Head: Normocephalic and atraumatic.  Mouth/Throat: Oropharynx is clear and moist.  Cardiovascular: Normal rate, regular rhythm and normal heart sounds.   No murmur heard. Pulmonary/Chest: Effort normal and breath sounds normal. No respiratory distress. He has no wheezes.  Abdominal: Soft. Bowel sounds are normal. There is no tenderness. There is no rebound.  Musculoskeletal: He exhibits no edema.  No midline tenderness to palpation over the T. Or L-spine, full range of motion of bilateral hips and knees, normal gait  Lymphadenopathy:    He has no cervical adenopathy.  Neurological: He is alert and oriented to person, place, and  time.  Skin: Skin is warm and dry.  Psychiatric: He has a normal mood and affect.    ED Course  Procedures (including critical care time) Labs Review Labs Reviewed  BASIC METABOLIC PANEL - Abnormal; Notable for the following:    Glucose, Bld 105 (*)    All other components within normal limits  CBC WITH DIFFERENTIAL - Abnormal; Notable for the following:    WBC 13.0 (*)    RBC 3.96 (*)    Hemoglobin 10.9 (*)    HCT 29.7 (*)    MCV 75.0 (*)    MCHC 36.7 (*)    RDW 16.8 (*)    Neutro Abs 9.1 (*)    Monocytes Absolute 1.6 (*)    All other components within normal limits  RETICULOCYTES - Abnormal; Notable for the following:    Retic Ct Pct 7.2 (*)    RBC.  3.95 (*)    Retic Count, Manual 284.4 (*)    All other components within normal limits  CBC WITH DIFFERENTIAL   Imaging Review Dg Chest 2 View  08/05/2013   CLINICAL DATA:  Sickle cell crisis, low back and bilateral leg pain  EXAM: CHEST  2 VIEW  COMPARISON:  04/02/2013  FINDINGS: Normal heart size, mediastinal contours, and pulmonary vascularity.  Lungs clear.  No pleural effusion or pneumothorax.  Bones unremarkable.  IMPRESSION: No acute abnormalities.   Electronically Signed   By: Ulyses SouthwardMark  Boles M.D.   On: 08/05/2013 12:16    EKG Interpretation   None       MDM   Final diagnoses:  Sickle cell crisis  Anemia    Patient presents with acute sickle cell crisis. He denies any chest pain, shortness of breath, or fever. Pain is classic for her sickle cell symptoms. He is uncomfortable but nontoxic on exam. Vital signs are reassuring. Patient was given 2 mg Dilaudid x3 and Toradol and NS.patient noted to have a white count of 13.1 and hematocrit of 29.7 which is low for him (35 is baseline).  Chest x-ray is negative for infiltrate. He denies any infectious symptoms. Reticulocyte count is appropriately elevated at 7.2. Patient has had improvement of his pain. Lab work suggest acute sickle crisis.  Patient wishes to try home management of pain. Will refill his pain medication for the next 24 hours. Given his laboratory abnormalities, patient was instructed to call Dr. Ashley RoyaltyMatthews tomorrow for reassessment.  After history, exam, and medical workup I feel the patient has been appropriately medically screened and is safe for discharge home. Pertinent diagnoses were discussed with the patient. Patient was given return precautions.     Shon Batonourtney F Ronel Rodeheaver, MD 08/05/13 [phone]

## 2013-08-06 ENCOUNTER — Non-Acute Institutional Stay (HOSPITAL_COMMUNITY)
Admission: AD | Admit: 2013-08-06 | Discharge: 2013-08-06 | Disposition: A | Discharge status: Home / Self Care | Payer: BC Managed Care – PPO | Attending: Family Medicine | Admitting: Family Medicine

## 2013-08-06 ENCOUNTER — Encounter (HOSPITAL_COMMUNITY): Payer: Self-pay | Admitting: Hematology

## 2013-08-06 DIAGNOSIS — M545 Low back pain, unspecified: Secondary | ICD-10-CM | POA: Insufficient documentation

## 2013-08-06 DIAGNOSIS — E860 Dehydration: Secondary | ICD-10-CM | POA: Insufficient documentation

## 2013-08-06 DIAGNOSIS — D5700 Hb-SS disease with crisis, unspecified: Secondary | ICD-10-CM | POA: Diagnosis present

## 2013-08-06 DIAGNOSIS — M79609 Pain in unspecified limb: Secondary | ICD-10-CM | POA: Insufficient documentation

## 2013-08-06 DIAGNOSIS — Z79899 Other long term (current) drug therapy: Secondary | ICD-10-CM | POA: Insufficient documentation

## 2013-08-06 LAB — COMPREHENSIVE METABOLIC PANEL
ALK PHOS: 256 U/L — AB (ref 39–117)
ALT: 27 U/L (ref 0–53)
AST: 74 U/L — AB (ref 0–37)
Albumin: 4.5 g/dL (ref 3.5–5.2)
BUN: 6 mg/dL (ref 6–23)
CALCIUM: 9.7 mg/dL (ref 8.4–10.5)
CHLORIDE: 99 meq/L (ref 96–112)
CO2: 25 meq/L (ref 19–32)
Creatinine, Ser: 0.73 mg/dL (ref 0.50–1.35)
GFR calc Af Amer: >90 mL/min (ref >90)
Glucose, Bld: 115 mg/dL — ABNORMAL HIGH (ref 70–99)
POTASSIUM: 3.9 meq/L (ref 3.7–5.3)
Sodium: 136 mEq/L — ABNORMAL LOW (ref 137–147)
Total Bilirubin: 1.8 mg/dL — ABNORMAL HIGH (ref 0.3–1.2)
Total Protein: 8.5 g/dL — ABNORMAL HIGH (ref 6.0–8.3)

## 2013-08-06 LAB — CBC WITH DIFFERENTIAL/PLATELET
BASOS PCT: 0 % (ref 0–1)
Basophils Absolute: 0 10*3/uL (ref 0.0–0.1)
EOS PCT: 0 % (ref 0–5)
Eosinophils Absolute: 0 10*3/uL (ref 0.0–0.7)
HEMATOCRIT: 32.1 % — AB (ref 39.0–52.0)
Hemoglobin: 11.5 g/dL — ABNORMAL LOW (ref 13.0–17.0)
LYMPHS ABS: 1.5 10*3/uL (ref 0.7–4.0)
Lymphocytes Relative: 10 % — ABNORMAL LOW (ref 12–46)
MCH: 27.5 pg (ref 26.0–34.0)
MCHC: 35.8 g/dL (ref 30.0–36.0)
MCV: 76.8 fL — AB (ref 78.0–100.0)
MONOS PCT: 12 % (ref 3–12)
Monocytes Absolute: 1.8 10*3/uL — ABNORMAL HIGH (ref 0.1–1.0)
NEUTROS ABS: 12.1 10*3/uL — AB (ref 1.7–7.7)
Neutrophils Relative %: 78 % — ABNORMAL HIGH (ref 43–77)
Platelets: 155 10*3/uL (ref 150–400)
RBC: 4.18 MIL/uL — AB (ref 4.22–5.81)
RDW: 17.4 % — ABNORMAL HIGH (ref 11.5–15.5)
WBC: 15.4 10*3/uL — ABNORMAL HIGH (ref 4.0–10.5)

## 2013-08-06 LAB — RETICULOCYTES
RBC.: 4.18 MIL/uL — ABNORMAL LOW (ref 4.22–5.81)
RETIC COUNT ABSOLUTE: 292.6 10*3/uL — AB (ref 19.0–186.0)
Retic Ct Pct: 7 % — ABNORMAL HIGH (ref 0.4–3.1)

## 2013-08-06 MED ORDER — HYDROMORPHONE HCL PF 2 MG/ML IJ SOLN
1.5000 mg | Freq: Once | INTRAMUSCULAR | Status: AC
  Filled 2013-08-06: qty 1

## 2013-08-06 MED ORDER — FOLIC ACID 1 MG PO TABS: 1.0000 mg | ORAL_TABLET | Freq: Every day | ORAL | Status: DC

## 2013-08-06 MED ORDER — SODIUM CHLORIDE 0.9 % IJ SOLN: 9.0000 mL | INTRAMUSCULAR | Status: DC | PRN

## 2013-08-06 MED ORDER — HYDROMORPHONE HCL PF 2 MG/ML IJ SOLN
2.0000 mg | Freq: Once | INTRAMUSCULAR | Status: AC
  Administered 2013-08-06: 2 mg via INTRAVENOUS
  Filled 2013-08-06: qty 1

## 2013-08-06 MED ORDER — HYDROMORPHONE HCL PF 2 MG/ML IJ SOLN: 2.5000 mg | Freq: Once | INTRAMUSCULAR | Status: DC

## 2013-08-06 MED ORDER — DIPHENHYDRAMINE HCL 25 MG PO CAPS: 25.0000 mg | ORAL_CAPSULE | ORAL | Status: DC | PRN

## 2013-08-06 MED ORDER — DEXTROSE-NACL 5-0.45 % IV SOLN
INTRAVENOUS | Status: DC
  Administered 2013-08-06 (×2): via INTRAVENOUS

## 2013-08-06 MED ORDER — HYDROMORPHONE HCL PF 2 MG/ML IJ SOLN
2.5000 mg | INTRAMUSCULAR | Status: AC | PRN
  Administered 2013-08-06: 2.5 mg via INTRAVENOUS
  Filled 2013-08-06: qty 2

## 2013-08-06 MED ORDER — PANTOPRAZOLE SODIUM 40 MG IV SOLR
40.0000 mg | Freq: Every day | INTRAVENOUS | Status: DC
  Administered 2013-08-06: 40 mg via INTRAVENOUS
  Filled 2013-08-06: qty 40

## 2013-08-06 MED ORDER — ONDANSETRON HCL 4 MG/2ML IJ SOLN: 4.0000 mg | INTRAMUSCULAR | Status: DC | PRN

## 2013-08-06 MED ORDER — NALOXONE HCL 0.4 MG/ML IJ SOLN: 0.4000 mg | INTRAMUSCULAR | Status: DC | PRN

## 2013-08-06 MED ORDER — HYDROMORPHONE HCL PF 2 MG/ML IJ SOLN
2.0000 mg | INTRAMUSCULAR | Status: AC | PRN
  Filled 2013-08-06: qty 1

## 2013-08-06 MED ORDER — HYDROMORPHONE HCL PF 2 MG/ML IJ SOLN
1.5000 mg | Freq: Once | INTRAMUSCULAR | Status: AC
  Administered 2013-08-06: 1.5 mg via INTRAVENOUS

## 2013-08-06 MED ORDER — HYDROMORPHONE 0.3 MG/ML IV SOLN
INTRAVENOUS | Status: DC
  Administered 2013-08-06: 17:00:00 via INTRAVENOUS
  Administered 2013-08-06: 2.4 mg via INTRAVENOUS
  Administered 2013-08-06: 6.98 mg via INTRAVENOUS
  Administered 2013-08-06: 12:00:00 via INTRAVENOUS
  Filled 2013-08-06 (×2): qty 25

## 2013-08-06 MED ORDER — HYDROCODONE-ACETAMINOPHEN 5-325 MG PO TABS: 1.0000 | ORAL_TABLET | Freq: Four times a day (QID) | ORAL | Status: DC | PRN

## 2013-08-06 MED ORDER — ONDANSETRON HCL 4 MG PO TABS: 4.0000 mg | ORAL_TABLET | ORAL | Status: DC | PRN

## 2013-08-06 MED ORDER — HYDROMORPHONE HCL PF 2 MG/ML IJ SOLN
2.0000 mg | Freq: Once | INTRAMUSCULAR | Status: DC
  Administered 2013-08-06: 2 mg via INTRAVENOUS

## 2013-08-06 MED ORDER — DIPHENHYDRAMINE HCL 50 MG/ML IJ SOLN: 12.5000 mg | INTRAMUSCULAR | Status: DC | PRN

## 2013-08-06 MED ORDER — SODIUM CHLORIDE 0.9 % IV SOLN: INTRAVENOUS | Status: DC

## 2013-08-06 MED ORDER — KETOROLAC TROMETHAMINE 30 MG/ML IJ SOLN
30.0000 mg | Freq: Once | INTRAMUSCULAR | Status: AC
  Administered 2013-08-06: 30 mg via INTRAVENOUS
  Filled 2013-08-06: qty 1

## 2013-08-06 MED ORDER — HYDROCODONE-ACETAMINOPHEN 10-325 MG PO TABS
1.0000 | ORAL_TABLET | Freq: Once | ORAL | Status: AC
  Administered 2013-08-06: 1 via ORAL
  Filled 2013-08-06: qty 1

## 2013-08-06 MED ORDER — PANTOPRAZOLE SODIUM 40 MG PO TBEC
40.0000 mg | DELAYED_RELEASE_TABLET | Freq: Every day | ORAL | Status: DC
  Filled 2013-08-06: qty 1

## 2013-08-06 NOTE — H&P (Signed)
[facility] History and Physical  William Jennings YNW:[id number] DOB: 1990/09/08 DOA: 08/06/2013   PCP: Altha Harm., MD   Chief Complaint: sickle cell pain   HPI: [facility] complaining of lower back pain and bilateral leg pain since 0300, reports that this is his normal sickle cell crisis pain and it has been 10/10 and he has not been able to control at home.  He was seen in the ER yesterday and treated with IVFs, dilaudid and toradol IV and was doing better until this am when the pain acutely returned.  He also reported that he had been partying for his birthday and had consumed alcohol.    Review of Systems:  Constitutional: No weight loss, night sweats, Fevers, chills, fatigue.  HEENT: No headaches, dizziness, seizures, vision changes, difficulty swallowing,Tooth/dental problems,Sore throat, No sneezing, itching, ear ache, nasal congestion, post nasal drip,  Cardio-vascular: No chest pain, Orthopnea, PND, swelling in lower extremities, anasarca, dizziness, palpitations  GI: No heartburn, indigestion, abdominal pain, nausea, vomiting, diarrhea, change in bowel habits, loss of appetite  Resp: No shortness of breath with exertion or at rest. No excess mucus, no productive cough, No non-productive cough, No coughing up of blood.No change in color of mucus.No wheezing.No chest wall deformity  Skin: no rash or lesions.  GU: no dysuria, change in color of urine, no urgency or frequency. No flank pain.  Musculoskeletal: No joint pain or swelling. No decreased range of motion. Significant lower back pain and leg pain.   Psych: No change in mood or affect. No depression or anxiety. No memory loss.    Past Medical History  Diagnosis Date  . Sickle cell disease    Past Surgical History  Procedure Laterality Date  . Cholecystectomy     Social History:  reports that he has quit smoking. He has never used smokeless tobacco. He  reports that he drinks alcohol. He reports that he does not use illicit drugs.  Allergies  Allergen Reactions  . Shellfish Allergy Anaphylaxis    History reviewed. No pertinent family history.  Prior to Admission medications   Medication Sig Start Date End Date Taking? Authorizing Provider  folic acid (FOLVITE) 800 MCG tablet Take 800 mcg by mouth daily.   Yes Historical Provider, MD  HYDROcodone-acetaminophen (NORCO/VICODIN) 5-325 MG per tablet Take 1 tablet by mouth every 6 (six) hours as needed for severe pain. 08/05/13  Yes Shon Baton, MD  ibuprofen (ADVIL,MOTRIN) 200 MG tablet Take 400 mg by mouth every 6 (six) hours as needed for pain.   Yes Historical Provider, MD  HYDROcodone-acetaminophen (NORCO/VICODIN) 5-325 MG per tablet Take 1 tablet by mouth every 6 (six) hours as needed for moderate pain. 07/24/13   Massie Maroon, FNP   Physical Exam: Filed Vitals:   08/06/13 1245 08/06/13 1246 08/06/13 1336 08/06/13 1436  BP:   129/68 119/67  Pulse: 110 110 101 104  Temp:   97.8 F (36.6 C) 100 F (37.8 C)  TempSrc:   Oral   Resp: 14 16 17 17   Height:      Weight:      SpO2: 75% 99% 100% 99%  Constitutional: He is oriented to person, place, and time. No distress. Uncomfortable appearing  HENT: Head: Normocephalic and atraumatic.  Mouth/Throat: Oropharynx is clear and dry mucus membranes.  Cardiovascular: Normal rate, regular rhythm and normal heart sounds. No murmur heard.  Pulmonary/Chest: Effort normal and breath sounds normal. No respiratory  distress. He has no wheezes.  Abdominal: Soft. Bowel sounds are normal. There is no tenderness. There is no rebound.  Musculoskeletal: He exhibits no edema. Tenderness to palpation over the sacral spine,  full range of motion of bilateral hips and knees, normal gait  Neurological: He is alert and oriented to person, place, and time.  Skin: Skin is warm and dry.  Psychiatric: He has a normal mood and affect.  Labs on Admission:    Basic Metabolic Panel:  Recent Labs Lab 08/05/13 0920 08/06/13 0815  NA 142 136*  K 4.2 3.9  CL 104 99  CO2 24 25  GLUCOSE 105* 115*  BUN 6 6  CREATININE 0.90 0.73  CALCIUM 9.6 9.7   Liver Function Tests:  Recent Labs Lab 08/06/13 0815  AST 74*  ALT 27  ALKPHOS 256*  BILITOT 1.8*  PROT 8.5*  ALBUMIN 4.5   CBC:  Recent Labs Lab 08/05/13 1000 08/06/13 0815  WBC 13.0* 15.4*  NEUTROABS 9.1* 12.1*  HGB 10.9* 11.5*  HCT 29.7* 32.1*  MCV 75.0* 76.8*  PLT 155 155   BNP: No components found with this basename: POCBNP,   Radiological Exams on Admission: Dg Chest 2 View  08/05/2013   CLINICAL DATA:  Sickle cell crisis, low back and bilateral leg pain  EXAM: CHEST  2 VIEW  COMPARISON:  04/02/2013  FINDINGS: Normal heart size, mediastinal contours, and pulmonary vascularity.  Lungs clear.  No pleural effusion or pneumothorax.  Bones unremarkable.  IMPRESSION: No acute abnormalities.   Electronically Signed   By: Ulyses SouthwardMark  Boles M.D.   On: 08/05/2013 12:16   Assessment/Plan: Active Problems:   Sickle cell crisis    Dehydration  1. [facility]. Start IVFs, Dilaudid per weight based rapid re-dosing, and IV Toradol, check labs, K pad ordered, monitor vitals and regularly reassess pain.    Code Status: FULL   Standley Dakinslanford Kaida Games, MD, CDE, FAAFP  Pager [phone]  If 7PM-7AM, please contact night-coverage [email or web address] Password [email or web address] 08/06/2013, 4:17 PM

## 2013-08-06 NOTE — Discharge Summary (Signed)
Pt was seen and examined.  I agree with assessment and plan as outlined above by Hart RochesterHollis, NP.   Rodney Langton. L. Chai Verdejo, MD, CDE, FAAFP [facility] [place], [place]

## 2013-08-06 NOTE — Discharge Summary (Signed)
[facility] Discharge Summary   Patient ID: William JarvisGeovanni D Jennings MRN: [id number] DOB/AGE: [birth year] 23 y.o.  Admit date: 08/06/2013 Discharge date: 08/06/2013  Primary Care Physician:  MATTHEWS,MICHELLE A., MD  Admission Diagnoses:  Active Problems:   Sickle cell crisis  HPI [facility] of lower back pain and bilateral leg pain since 0300, reports that pain intensity and description is consistent with his normal sickle cell crisis. Pain intensity was 10/10 and uncontrolled at home. He was seen in the ER on 08/06/2013 and treated with IVFs, dilaudid and toradol IV and was doing better until this am when the pain acutely returned. He also reported that he had been partying for his birthday and had consumed alcohol.      Discharge Diagnoses:   Sickle cell anemia with pain  Discharge Medications:    Medication List    ASK your doctor about these medications       folic acid 800 MCG tablet  Commonly known as:  FOLVITE  Take 800 mcg by mouth daily.     HYDROcodone-acetaminophen 5-325 MG per tablet  Commonly known as:  NORCO/VICODIN  Take 1 tablet by mouth every 6 (six) hours as needed for moderate pain.     HYDROcodone-acetaminophen 5-325 MG per tablet  Commonly known as:  NORCO/VICODIN  Take 1 tablet by mouth every 6 (six) hours as needed for severe pain.     ibuprofen 200 MG tablet  Commonly known as:  ADVIL,MOTRIN  Take 400 mg by mouth every 6 (six) hours as needed for pain.         Consults:  None  Significant Diagnostic Studies:  Dg Chest 2 View  08/05/2013   CLINICAL DATA:  Sickle cell crisis, low back and bilateral leg pain  EXAM: CHEST  2 VIEW  COMPARISON:  04/02/2013  FINDINGS: Normal heart size, mediastinal contours, and pulmonary vascularity.  Lungs clear.  No pleural effusion or pneumothorax.  Bones unremarkable.  IMPRESSION: No acute abnormalities.   Electronically Signed   By: Ulyses SouthwardMark  Boles M.D.   On: 08/05/2013  12:16     [facility] Course: 1. Sickle cell anemia with pain: Pt was treated with IVF, Toradol and IV analgesics. Pt was initially treated with weight based rapid re-dosing and then transitioned to a weight based PCA Dilaudid. Patient was 6/10 at 6 pm, given 2 mg dose of IV Dilaudid and Vicodin 10-325. Pain intensity was 4/10 prior to discharge.  He had total use of 9.38 with 27demands and 16 deliveries. Pain has decreased from 10/10 to 4/10. Pt will be discharged home on Vicodin 10-325 every 6 hours as prescribed.   2. Dehydration: Dehydration:  Pt presented with mild hydration. He was managed with IVF D5.45. Pt had no  nausea/ emesis. Patient was able to tolerate oral intake without difficulty   Physical Exam at Discharge:  BP 113/56  Pulse 96  Temp(Src) 99 F (37.2 C) (Oral)  Resp 16  Ht 5\' 8"  (1.727 m)  Wt 140 lb (63.504 kg)  BMI 21.29 kg/m2  SpO2 99%  Constitutional: He is oriented to person, place, and time. No distress. Uncomfortable appearing  HENT: Head: Normocephalic and atraumatic.  Mouth/Throat: Oropharynx is clear and dry mucus membranes.  Cardiovascular: Normal rate, regular rhythm and normal heart sounds. No murmur heard.  Pulmonary/Chest: Effort normal and breath sounds normal. No respiratory distress. He has no wheezes.  Abdominal: Soft. Bowel sounds are normal. There is no tenderness. There  is no rebound.  Musculoskeletal: He exhibits no edema. Tenderness to palpation over the sacral spine, full range of motion of bilateral hips and knees, normal gait   Disposition at Discharge: 01-Home or Self Care  Diet:  General Condition at Discharge:   Stable  Time spent on Discharge:  Greater than 30 minutes.  Signed: Cristella Stiver M 08/06/2013, 7:14 PM

## 2013-08-06 NOTE — Discharge Instructions (Signed)
Sickle Cell Anemia, Adult °Sickle cell anemia is a condition in which red blood cells have an abnormal "sickle" shape. This abnormal shape shortens the cells' life span, which results in a lower than normal concentration of red blood cells in the blood. The sickle shape also causes the cells to clump together and block free blood flow through the blood vessels. As a result, the tissues and organs of the body do not receive enough oxygen. Sickle cell anemia causes organ damage and pain and increases the risk of infection. °CAUSES  °Sickle cell anemia is a genetic disorder. Those who receive two copies of the gene have the condition, and those who receive one copy have the trait. °RISK FACTORS °The sickle cell gene is most common in people whose families originated in [place]. Other areas of the globe where sickle cell trait occurs include the Mediterranean, [place] and [place], the [place], and the [place].  °SIGNS AND SYMPTOMS °· Pain, especially in the extremities, back, chest, or abdomen (common). The pain may start suddenly or may develop following an illness, especially if there is dehydration. Pain can also occur due to overexertion or exposure to extreme temperature changes. °· Frequent severe bacterial infections, especially certain types of pneumonia and meningitis. °· Pain and swelling in the hands and feet. °· Decreased activity.   °· Loss of appetite.   °· Change in behavior. °· Headaches. °· Seizures. °· Shortness of breath or difficulty breathing. °· Vision changes. °· Skin ulcers. °Those with the trait may not have symptoms or they may have mild symptoms.  °DIAGNOSIS  °Sickle cell anemia is diagnosed with blood tests that demonstrate the genetic trait. It is often diagnosed during the newborn period, due to mandatory testing nationwide. A variety of blood tests, X-rays, CT scans, MRI scans, ultrasounds, and lung function tests may also be done to monitor the condition. °TREATMENT  °Sickle  cell anemia may be treated with: °· Medicines. You may be given pain medicines, antibiotic medicines (to treat and prevent infections) or medicines to increase the production of certain types of hemoglobin. °· Fluids. °· Oxygen. °· Blood transfusions. °HOME CARE INSTRUCTIONS  °· Drink enough fluid to keep your urine clear or pale yellow. Increase your fluid intake in hot weather and during exercise. °· Do not smoke. Smoking lowers oxygen levels in the blood.   °· Only take over-the-counter or prescription medicines for pain, fever, or discomfort as directed by your health care provider. °· Take antibiotics as directed by your health care provider. Make sure you finish them it even if you start to feel better.   °· Take supplements as directed by your health care provider.   °· Consider wearing a medical alert bracelet. This tells anyone caring for you in an emergency of your condition.   °· When traveling, keep your medical information, health care provider's names, and the medicines you take with you at all times.   °· If you develop a fever, do not take medicines to reduce the fever right away. This could cover up a problem that is developing. Notify your health care provider. °· Keep all follow-up appointments with your health care provider. Sickle cell anemia requires regular medical care. °SEEK MEDICAL CARE IF: ° You have a fever. °SEEK IMMEDIATE MEDICAL CARE IF:  °· You feel dizzy or faint.   °· You have new abdominal pain, especially on the left side near the stomach area.   °· You develop a persistent, often uncomfortable and painful penile erection (priapism). If this is not treated immediately it   will lead to impotence.   °· You have numbness your arms or legs or you have a hard time moving them.   °· You have a hard time with speech.   °· You have a fever or persistent symptoms for more than 2 3 days.   °· You have a fever and your symptoms suddenly get worse.   °· You have signs or symptoms of infection.  These include:   °· Chills.   °· Abnormal tiredness (lethargy).   °· Irritability.   °· Poor eating.   °· Vomiting.   °· You develop pain that is not helped with medicine.   °· You develop shortness of breath. °· You have pain in your chest.   °· You are coughing up pus-like or bloody sputum.   °· You develop a stiff neck. °· Your feet or hands swell or have pain. °· Your abdomen appears bloated. °· You develop joint pain. °MAKE SURE YOU: °· Understand these instructions. °· Will watch your child's condition. °· Will get help right away if your child is not doing well or gets worse. °Document Released: 09/08/2005 Document Revised: 03/21/2013 Document Reviewed: 01/10/2013 °Exit[employer]® Patient Information ©2014 [employer]. ° °

## 2013-08-06 NOTE — H&P (Signed)
Pt was seen and examined by me.  H&P and orders reviewed.  I agree with assessment and plan as outlined above by Julianne HandlerLachina Hollis, NP.   Rodney Langton. L. Johnson, MD, CDE, FAAFP [facility] [place], [place]

## 2013-08-07 NOTE — Discharge Summary (Signed)
Pt was seen, examined, history taken by me.  I agree with assessment and plan detailed above by Julianne HandlerLachina Hollis, NP.  Rodney Langton. L. Emili Mcloughlin, MD, CDE, FAAFP [facility] [place], [place]

## 2013-08-08 ENCOUNTER — Telehealth (HOSPITAL_COMMUNITY): Payer: Self-pay | Admitting: Hematology

## 2013-08-08 ENCOUNTER — Encounter (HOSPITAL_COMMUNITY): Payer: Self-pay | Admitting: *Deleted

## 2013-08-08 ENCOUNTER — Non-Acute Institutional Stay (HOSPITAL_COMMUNITY)
Admission: AD | Admit: 2013-08-08 | Discharge: 2013-08-08 | Disposition: A | Discharge status: Home / Self Care | Payer: BC Managed Care – PPO | Attending: Internal Medicine | Admitting: Internal Medicine

## 2013-08-08 DIAGNOSIS — D57219 Sickle-cell/Hb-C disease with crisis, unspecified: Secondary | ICD-10-CM | POA: Insufficient documentation

## 2013-08-08 DIAGNOSIS — R079 Chest pain, unspecified: Secondary | ICD-10-CM

## 2013-08-08 DIAGNOSIS — Z9089 Acquired absence of other organs: Secondary | ICD-10-CM | POA: Insufficient documentation

## 2013-08-08 DIAGNOSIS — D571 Sickle-cell disease without crisis: Secondary | ICD-10-CM

## 2013-08-08 DIAGNOSIS — D5700 Hb-SS disease with crisis, unspecified: Secondary | ICD-10-CM

## 2013-08-08 DIAGNOSIS — H9190 Unspecified hearing loss, unspecified ear: Secondary | ICD-10-CM

## 2013-08-08 DIAGNOSIS — E860 Dehydration: Secondary | ICD-10-CM | POA: Insufficient documentation

## 2013-08-08 DIAGNOSIS — Z87891 Personal history of nicotine dependence: Secondary | ICD-10-CM | POA: Insufficient documentation

## 2013-08-08 DIAGNOSIS — Z79899 Other long term (current) drug therapy: Secondary | ICD-10-CM | POA: Insufficient documentation

## 2013-08-08 LAB — CBC WITH DIFFERENTIAL/PLATELET
Basophils Absolute: 0 10*3/uL (ref 0.0–0.1)
Basophils Relative: 0 % (ref 0–1)
Eosinophils Absolute: 0 10*3/uL (ref 0.0–0.7)
Eosinophils Relative: 0 % (ref 0–5)
HCT: 32.9 % — ABNORMAL LOW (ref 39.0–52.0)
Hemoglobin: 12.1 g/dL — ABNORMAL LOW (ref 13.0–17.0)
Lymphocytes Relative: 15 % (ref 12–46)
Lymphs Abs: 1.6 10*3/uL (ref 0.7–4.0)
MCH: 27.8 pg (ref 26.0–34.0)
MCHC: 36.8 g/dL — ABNORMAL HIGH (ref 30.0–36.0)
MCV: 75.6 fL — ABNORMAL LOW (ref 78.0–100.0)
Monocytes Absolute: 1.2 10*3/uL — ABNORMAL HIGH (ref 0.1–1.0)
Monocytes Relative: 11 % (ref 3–12)
NEUTROS PCT: 74 % (ref 43–77)
Neutro Abs: 8 10*3/uL — ABNORMAL HIGH (ref 1.7–7.7)
PLATELETS: 101 10*3/uL — AB (ref 150–400)
RBC: 4.35 MIL/uL (ref 4.22–5.81)
RDW: 16.4 % — AB (ref 11.5–15.5)
WBC: 10.8 10*3/uL — ABNORMAL HIGH (ref 4.0–10.5)

## 2013-08-08 LAB — COMPREHENSIVE METABOLIC PANEL
ALT: 35 U/L (ref 0–53)
AST: 48 U/L — ABNORMAL HIGH (ref 0–37)
Albumin: 4.6 g/dL (ref 3.5–5.2)
Alkaline Phosphatase: 232 U/L — ABNORMAL HIGH (ref 39–117)
BUN: 9 mg/dL (ref 6–23)
CALCIUM: 10.1 mg/dL (ref 8.4–10.5)
CO2: 25 mEq/L (ref 19–32)
CREATININE: 0.75 mg/dL (ref 0.50–1.35)
Chloride: 99 mEq/L (ref 96–112)
GFR calc non Af Amer: >90 mL/min (ref >90)
Glucose, Bld: 87 mg/dL (ref 70–99)
Potassium: 3.8 mEq/L (ref 3.7–5.3)
SODIUM: 139 meq/L (ref 137–147)
Total Bilirubin: 2.7 mg/dL — ABNORMAL HIGH (ref 0.3–1.2)
Total Protein: 9.7 g/dL — ABNORMAL HIGH (ref 6.0–8.3)

## 2013-08-08 MED ORDER — IBUPROFEN 600 MG PO TABS: 600.0000 mg | ORAL_TABLET | Freq: Four times a day (QID) | ORAL | Status: DC | PRN

## 2013-08-08 MED ORDER — KETOROLAC TROMETHAMINE 15 MG/ML IJ SOLN
30.0000 mg | Freq: Once | INTRAMUSCULAR | Status: AC
  Administered 2013-08-08: 30 mg via INTRAVENOUS
  Filled 2013-08-08: qty 2

## 2013-08-08 MED ORDER — HYDROMORPHONE HCL PF 2 MG/ML IJ SOLN
1.6000 mg | Freq: Once | INTRAMUSCULAR | Status: AC
  Administered 2013-08-08: 1.6 mg via INTRAVENOUS
  Filled 2013-08-08: qty 1

## 2013-08-08 MED ORDER — HYDROMORPHONE HCL PF 2 MG/ML IJ SOLN
2.2000 mg | Freq: Once | INTRAMUSCULAR | Status: AC
Start: 2013-08-08 — End: 2013-08-08
  Administered 2013-08-08: 2.2 mg via INTRAVENOUS
  Filled 2013-08-08: qty 2

## 2013-08-08 MED ORDER — HYDROMORPHONE 0.3 MG/ML IV SOLN
INTRAVENOUS | Status: DC
  Administered 2013-08-08: 1.82 mg via INTRAVENOUS
  Administered 2013-08-08: 15:00:00 via INTRAVENOUS
  Administered 2013-08-08: 2.95 mg via INTRAVENOUS
  Filled 2013-08-08: qty 25

## 2013-08-08 MED ORDER — DIPHENHYDRAMINE HCL 50 MG/ML IJ SOLN: 12.5000 mg | Freq: Four times a day (QID) | INTRAMUSCULAR | Status: DC | PRN

## 2013-08-08 MED ORDER — ONDANSETRON HCL 4 MG/2ML IJ SOLN: 4.0000 mg | Freq: Four times a day (QID) | INTRAMUSCULAR | Status: DC | PRN

## 2013-08-08 MED ORDER — DIPHENHYDRAMINE HCL 12.5 MG/5ML PO ELIX: 12.5000 mg | ORAL_SOLUTION | Freq: Four times a day (QID) | ORAL | Status: DC | PRN

## 2013-08-08 MED ORDER — KETOROLAC TROMETHAMINE 30 MG/ML IJ SOLN: 30.0000 mg | Freq: Once | INTRAMUSCULAR | Status: DC

## 2013-08-08 MED ORDER — DEXTROSE-NACL 5-0.45 % IV SOLN
INTRAVENOUS | Status: DC
  Administered 2013-08-08: 13:00:00 via INTRAVENOUS

## 2013-08-08 MED ORDER — NALOXONE HCL 0.4 MG/ML IJ SOLN: 0.4000 mg | INTRAMUSCULAR | Status: DC | PRN

## 2013-08-08 MED ORDER — HYDROMORPHONE HCL PF 2 MG/ML IJ SOLN
2.7000 mg | Freq: Once | INTRAMUSCULAR | Status: AC
  Administered 2013-08-08: 2.7 mg via INTRAVENOUS
  Filled 2013-08-08: qty 2

## 2013-08-08 MED ORDER — SODIUM CHLORIDE 0.9 % IJ SOLN
9.0000 mL | INTRAMUSCULAR | Status: DC | PRN
Start: 2013-08-08 — End: 2013-08-08

## 2013-08-08 NOTE — Discharge Summary (Signed)
Physician Discharge Summary  William Jennings ZOX:[id number] DOB: June 27, 1990 DOA: 08/08/2013  PCP: Altha Harm., MD  Admit date: 08/08/2013 Discharge date: 08/08/2013  Discharge Diagnoses:  Active Problems:   Sickle-cell/Hb-C disease with crisis   Dehydration   Discharge Condition: Stable  Disposition: Home  Diet: General Wt Readings from Last 3 Encounters:  08/06/13 140 lb (63.504 kg)  08/05/13 140 lb (63.504 kg)  07/21/13 145 lb (65.772 kg)    History of present illness:  22 year old male presents in clinic complaining of sharp, throbbing, pain to lower back and knees. Patient was admitted to clinic for extended observation on 08/06/2013 for pain that was unable to be managed on current pain regimen. Patient was treated with IV Toradol, IV Dilaudid via weight based rapid re-dosing protocol and transitioned to PCA. Prior to discharge patient's pain intensity decreased to 4/10. Patient reports that pain returned to 7-8/10 intensity on 08/07/2013 in the am unrelieved by Vicodin 10/325 mg every 6 hours. Patient was unable to get to clinic on 08/07/2013 due to weather conditions. Patient states that he is hydrating and taking medications as scheduled but is unable to manage at home. Patient denies nausea, vomiting, and diarrhea, but reports having a decreased appetite. Patient states that he typically has a diminished appetite with sickle cell crisis. Patient attributes current pain crisis to weather changes.    Hospital Course:  1. Sickle Cell pain crisis: Patient admitted for extended observation, started on IV fluids, IV Toradol 30 mg once, IV Dilaudid per weight based rapid re-dosing for 3 doses. Pain remained between 5-6/10, patient typically tolerates a pain level of 2-3. Transitioned to Weight based PCA per protocol with a basal rate of 0.5 mg per hour. Patient re-assessed prior to discharge pain intensity 2/10.   2. Dehydration: Pt presented with mild hydration. She was managed  with IVF D5.45. Pt was able to tolerate oral fluids. Following hydration she was able to tolerate oral intake without difficulty  Discharge Exam:  Filed Vitals:   08/08/13 1653  BP: 112/60  Pulse: 102  Temp: 98.6 F (37 C)  Resp: 13   Filed Vitals:   08/08/13 1428 08/08/13 1454 08/08/13 1549 08/08/13 1653  BP: 104/54  117/35 112/60  Pulse: 110 108 106 102  Temp: 98.5 F (36.9 C)  98.5 F (36.9 C) 98.6 F (37 C)  TempSrc: Oral  Oral Oral  Resp: 15 17 17 13   SpO2: 99% 96% 97% 97%   General Appearance:  Alert, cooperative, no distress, appears stated age  Head:  Normocephalic, without obvious abnormality, atraumatic  thyroid: No enlargement/tenderness/nodules; no carotid  bruit or JVD  Back:  Symmetric, no curvature, Decreased ROM, no CVA tenderness  Lungs:  Clear to auscultation bilaterally, respirations unlabored Chest wall:  No tenderness or deformity  Heart:  Regular rate and rhythm, S1 and S2 normal, no murmur, rub or gallop  Abdomen: Soft, non-tender, bowel sounds active all four quadrants,  no masses, no organomegaly  Extremities:  Extremities normal, atraumatic, bilateral knees tender to palpation, no cyanosis or edema  Neurologic: CNII-XII intact. Normal strength, sensation and reflexes  throughout     Discharge Instructions     Medication List    ASK your doctor about these medications       folic acid 800 MCG tablet  Commonly known as:  FOLVITE  Take 800 mcg by mouth daily.     HYDROcodone-acetaminophen 5-325 MG per tablet  Commonly known as:  NORCO/VICODIN  Take 1 tablet  by mouth every 6 (six) hours as needed for severe pain.     ibuprofen 200 MG tablet  Commonly known as:  ADVIL,MOTRIN  Take 400 mg by mouth every 6 (six) hours as needed for pain.          The results of significant diagnostics from this hospitalization (including imaging, microbiology, ancillary and laboratory) are listed below for reference.    Significant Diagnostic  Studies: Dg Chest 2 View  08/05/2013   CLINICAL DATA:  Sickle cell crisis, low back and bilateral leg pain  EXAM: CHEST  2 VIEW  COMPARISON:  04/02/2013  FINDINGS: Normal heart size, mediastinal contours, and pulmonary vascularity.  Lungs clear.  No pleural effusion or pneumothorax.  Bones unremarkable.  IMPRESSION: No acute abnormalities.   Electronically Signed   By: Ulyses SouthwardMark  Boles M.D.   On: 08/05/2013 12:16    Microbiology: No results found for this or any previous visit (from the past 240 hour(s)).   Labs: Basic Metabolic Panel:  Recent Labs Lab 08/05/13 0920 08/06/13 0815 08/08/13 1230  NA 142 136* 139  K 4.2 3.9 3.8  CL 104 99 99  CO2 24 25 25   GLUCOSE 105* 115* 87  BUN 6 6 9   CREATININE 0.90 0.73 0.75  CALCIUM 9.6 9.7 10.1   Liver Function Tests:  Recent Labs Lab 08/06/13 0815 08/08/13 1230  AST 74* 48*  ALT 27 35  ALKPHOS 256* 232*  BILITOT 1.8* 2.7*  PROT 8.5* 9.7*  ALBUMIN 4.5 4.6   No results found for this basename: LIPASE, AMYLASE,  in the last 168 hours No results found for this basename: AMMONIA,  in the last 168 hours CBC:  Recent Labs Lab 08/05/13 1000 08/06/13 0815 08/08/13 1230  WBC 13.0* 15.4* 10.8*  NEUTROABS 9.1* 12.1* 8.0*  HGB 10.9* 11.5* 12.1*  HCT 29.7* 32.1* 32.9*  MCV 75.0* 76.8* 75.6*  PLT 155 155 101*   Cardiac Enzymes: No results found for this basename: CKTOTAL, CKMB, CKMBINDEX, TROPONINI,  in the last 168 hours BNP: No components found with this basename: POCBNP,  CBG: No results found for this basename: GLUCAP,  in the last 168 hours Ferritin: No results found for this basename: FERRITIN,  in the last 168 hours  Time coordinating discharge: 45 minutes  Signed:  Yelitza Reach M  08/08/2013, 5:14 PM

## 2013-08-08 NOTE — Telephone Encounter (Signed)
Called patient back and advised that I spoke with Dr. Ashley RoyaltyMatthews and it is ok for him to come to clinic. Patient asked about bringing his 25 year old stepson with him.  Explained that he wouldn't be able to bring his 6857year old stepson back because he would be getting IV narcotics and wouldn't be able to watch him. Patient  Verbalizes understanding and states he would be here for treatment before 12:00

## 2013-08-08 NOTE — Telephone Encounter (Signed)
Patient C/O pain to bilateral legs and back.  Patient rates pain 7/10 on pain scale.  Denies nausea/vomiting, diarrhea, fever or chest pain, denies abdominal pain or priapism. Patient has taken hydrocodone and ibuprofen as prescribed. Explained I would notify the physician and give him a call back.  Patient verbalizes understanding.

## 2013-08-08 NOTE — H&P (Signed)
[facility] History and Physical   Date: 08/08/2013  Patient name: William JarvisGeovanni D Jennings Medical record number: [id number] Date of birth: [birth year] Age: 23 y.o. Gender: male PCP: MATTHEWS,MICHELLE A., MD  Attending physician: Altha HarmMichelle A Matthews, MD  Chief Complaint: Sickle cell anemia with pain  History of Present Illness: 23 year old male presents in clinic complaining of sharp, throbbing, pain to lower back and knees. Patient was admitted to clinic for extended observation on 08/06/2013 for pain that was unable to be managed on current pain regimen. Patient was treated with IV Toradol, IV Dilaudid via weight based rapid re-dosing protocol and transitioned to PCA. Prior to discharge patient's pain intensity decreased to 4/10. Patient reports that pain returned to 7-8/10 intensity on 08/07/2013 in the am unrelieved by Vicodin 10/325 mg every 6 hours. Patient was unable to get to clinic on 08/07/2013 due to weather conditions.  Patient states that he is hydrating and taking medications as scheduled but is unable to manage at home. Patient denies nausea, vomiting, and diarrhea, but reports having a decreased appetite. Patient states that he typically has a diminished appetite with sickle cell crisis.  Patient attributes current pain crisis to weather changes.    Meds: Prescriptions prior to admission  Medication Sig Dispense Refill  . folic acid (FOLVITE) 800 MCG tablet Take 800 mcg by mouth daily.      Marland Kitchen. HYDROcodone-acetaminophen (NORCO/VICODIN) 5-325 MG per tablet Take 1 tablet by mouth every 6 (six) hours as needed for severe pain.  60 tablet  0  . ibuprofen (ADVIL,MOTRIN) 200 MG tablet Take 400 mg by mouth every 6 (six) hours as needed for pain.        Allergies: Shellfish allergy Past Medical History  Diagnosis Date  . Sickle cell disease    Past Surgical History  Procedure Laterality Date  . Cholecystectomy     History reviewed. No pertinent family history. History    Social History  . Marital Status: Single    Spouse Name: N/A    Number of Children: N/A  . Years of Education: N/A   Occupational History  . Not on file.   Social History Main Topics  . Smoking status: Former Games developermoker  . Smokeless tobacco: Never Used  . Alcohol Use: Yes     Comment: occasionally  . Drug Use: No  . Sexual Activity: No   Other Topics Concern  . Not on file   Social History Narrative  . No narrative on file    Review of Systems: Constitutional: negative Eyes: negative Ears, nose, mouth, throat, and face: negative Respiratory: negative Cardiovascular: negative Gastrointestinal: positive for diminished appetite Genitourinary:negative Integument/breast: negative Hematologic/lymphatic: negative Musculoskeletal:positive for back pain, myalgias, stiff joints and bilateral knee tenderness Neurological: negative Behavioral/Psych: negative Endocrine: negative Allergic/Immunologic: Negative  Physical Exam: Blood pressure 131/70, pulse 115, temperature 98.3 F (36.8 C), temperature source Oral, resp. rate 20, SpO2 100.00%. BP 131/70  Pulse 115  Temp(Src) 98.3 F (36.8 C) (Oral)  Resp 20  SpO2 100%  General Appearance:    Alert, cooperative, no distress, appears stated age  Head:    Normocephalic, without obvious abnormality, atraumatic  Eyes:    PERRL, conjunctiva/corneas clear, EOM's intact, fundi    benign, both eyes       Ears:    Normal TM's and external ear canals, both ears  Nose:   Nares normal, septum midline, mucosa normal, no drainage    or sinus tenderness  Throat:   Lips, mucosa, and  leukoplakia to tongue on inspection; teeth and gums normal  Neck:   Supple, symmetrical, trachea midline, no adenopathy;       thyroid:  No enlargement/tenderness/nodules; no carotid   bruit or JVD  Back:     Symmetric, no curvature,  Decreased ROM , lumbar region mildly tender to palpation, no CVA tenderness  Lungs:     Clear to auscultation bilaterally,  respirations unlabored  Chest wall:    No tenderness or deformity  Heart:    Regular rate and rhythm, S1 and S2 normal, no murmur, rub   or gallop  Abdomen:     Soft, non-tender, bowel sounds active all four quadrants,    no masses, no organomegaly  Extremities:   Extremities normal, atraumatic, bilateral knees tender to palpation, no cyanosis or edema  Pulses:   2+ and symmetric all extremities  Skin:   Skin color, texture, turgor normal, no rashes or lesions  Lymph nodes:   Cervical, supraclavicular, and axillary nodes normal  Neurologic:   CNII-XII intact. Normal strength, sensation and reflexes      throughout    Lab results: No results found for this or any previous visit (from the past 24 hour(s)).  Imaging results:  No results found.   Assessment & Plan: 1. Sickle cell anemia with pain:  Start IVF, Toradol IV and IV analgesics Dilaudid IV per weight based rapid re-dosing. Will transition to weight based PCA with 0.5 mg per hour basal rate if pain intensity remains greater than 7/10. Patient to use incentive spirometer and K pad.   2. Dehydration: Start D5. 45 at 125 per hour for mild dehydration   William Jennings M 08/08/2013, 11:47 AM

## 2013-08-08 NOTE — Discharge Instructions (Signed)
Sickle Cell Anemia, Adult °Sickle cell anemia is a condition in which red blood cells have an abnormal "sickle" shape. This abnormal shape shortens the cells' life span, which results in a lower than normal concentration of red blood cells in the blood. The sickle shape also causes the cells to clump together and block free blood flow through the blood vessels. As a result, the tissues and organs of the body do not receive enough oxygen. Sickle cell anemia causes organ damage and pain and increases the risk of infection. °CAUSES  °Sickle cell anemia is a genetic disorder. Those who receive two copies of the gene have the condition, and those who receive one copy have the trait. °RISK FACTORS °The sickle cell gene is most common in people whose families originated in [place]. Other areas of the globe where sickle cell trait occurs include the Mediterranean, [place] and [place], the [place], and the [place].  °SIGNS AND SYMPTOMS °· Pain, especially in the extremities, back, chest, or abdomen (common). The pain may start suddenly or may develop following an illness, especially if there is dehydration. Pain can also occur due to overexertion or exposure to extreme temperature changes. °· Frequent severe bacterial infections, especially certain types of pneumonia and meningitis. °· Pain and swelling in the hands and feet. °· Decreased activity.   °· Loss of appetite.   °· Change in behavior. °· Headaches. °· Seizures. °· Shortness of breath or difficulty breathing. °· Vision changes. °· Skin ulcers. °Those with the trait may not have symptoms or they may have mild symptoms.  °DIAGNOSIS  °Sickle cell anemia is diagnosed with blood tests that demonstrate the genetic trait. It is often diagnosed during the newborn period, due to mandatory testing nationwide. A variety of blood tests, X-rays, CT scans, MRI scans, ultrasounds, and lung function tests may also be done to monitor the condition. °TREATMENT  °Sickle  cell anemia may be treated with: °· Medicines. You may be given pain medicines, antibiotic medicines (to treat and prevent infections) or medicines to increase the production of certain types of hemoglobin. °· Fluids. °· Oxygen. °· Blood transfusions. °HOME CARE INSTRUCTIONS  °· Drink enough fluid to keep your urine clear or pale yellow. Increase your fluid intake in hot weather and during exercise. °· Do not smoke. Smoking lowers oxygen levels in the blood.   °· Only take over-the-counter or prescription medicines for pain, fever, or discomfort as directed by your health care provider. °· Take antibiotics as directed by your health care provider. Make sure you finish them it even if you start to feel better.   °· Take supplements as directed by your health care provider.   °· Consider wearing a medical alert bracelet. This tells anyone caring for you in an emergency of your condition.   °· When traveling, keep your medical information, health care provider's names, and the medicines you take with you at all times.   °· If you develop a fever, do not take medicines to reduce the fever right away. This could cover up a problem that is developing. Notify your health care provider. °· Keep all follow-up appointments with your health care provider. Sickle cell anemia requires regular medical care. °SEEK MEDICAL CARE IF: ° You have a fever. °SEEK IMMEDIATE MEDICAL CARE IF:  °· You feel dizzy or faint.   °· You have new abdominal pain, especially on the left side near the stomach area.   °· You develop a persistent, often uncomfortable and painful penile erection (priapism). If this is not treated immediately it   will lead to impotence.   °· You have numbness your arms or legs or you have a hard time moving them.   °· You have a hard time with speech.   °· You have a fever or persistent symptoms for more than 2 3 days.   °· You have a fever and your symptoms suddenly get worse.   °· You have signs or symptoms of infection.  These include:   °· Chills.   °· Abnormal tiredness (lethargy).   °· Irritability.   °· Poor eating.   °· Vomiting.   °· You develop pain that is not helped with medicine.   °· You develop shortness of breath. °· You have pain in your chest.   °· You are coughing up pus-like or bloody sputum.   °· You develop a stiff neck. °· Your feet or hands swell or have pain. °· Your abdomen appears bloated. °· You develop joint pain. °MAKE SURE YOU: °· Understand these instructions. °· Will watch your child's condition. °· Will get help right away if your child is not doing well or gets worse. °Document Released: 09/08/2005 Document Revised: 03/21/2013 Document Reviewed: 01/10/2013 °Exit[employer]® Patient Information ©2014 [employer]. ° °

## 2013-08-08 NOTE — Progress Notes (Signed)
Patient discharged home. VSS. Patient states feeling better and wants to go home. Discharge instructions reviewed. Patient to call office for follow up appointment. Patient left day hospital with belongings ambulatory escorted by friend.

## 2013-08-09 ENCOUNTER — Emergency Department (HOSPITAL_COMMUNITY): Payer: BC Managed Care – PPO

## 2013-08-09 ENCOUNTER — Emergency Department (HOSPITAL_COMMUNITY)
Admission: EM | Admit: 2013-08-09 | Discharge: 2013-08-10 | Disposition: A | Discharge status: Home / Self Care | Payer: BC Managed Care – PPO | Source: Home / Self Care | Attending: Emergency Medicine | Admitting: Emergency Medicine

## 2013-08-09 ENCOUNTER — Encounter (HOSPITAL_COMMUNITY): Payer: Self-pay | Admitting: Emergency Medicine

## 2013-08-09 DIAGNOSIS — D5700 Hb-SS disease with crisis, unspecified: Secondary | ICD-10-CM

## 2013-08-09 LAB — CBC WITH DIFFERENTIAL/PLATELET
BASOS ABS: 0 10*3/uL (ref 0.0–0.1)
Basophils Relative: 0 % (ref 0–1)
EOS ABS: 0 10*3/uL (ref 0.0–0.7)
Eosinophils Relative: 0 % (ref 0–5)
HCT: 27.8 % — ABNORMAL LOW (ref 39.0–52.0)
Hemoglobin: 10.1 g/dL — ABNORMAL LOW (ref 13.0–17.0)
LYMPHS ABS: 2 10*3/uL (ref 0.7–4.0)
Lymphocytes Relative: 21 % (ref 12–46)
MCH: 27.2 pg (ref 26.0–34.0)
MCHC: 36.3 g/dL — AB (ref 30.0–36.0)
MCV: 74.9 fL — AB (ref 78.0–100.0)
MONO ABS: 1.1 10*3/uL — AB (ref 0.1–1.0)
MONOS PCT: 12 % (ref 3–12)
NEUTROS ABS: 6.3 10*3/uL (ref 1.7–7.7)
Neutrophils Relative %: 67 % (ref 43–77)
Platelets: 109 10*3/uL — ABNORMAL LOW (ref 150–400)
RBC: 3.71 MIL/uL — ABNORMAL LOW (ref 4.22–5.81)
RDW: 16.4 % — AB (ref 11.5–15.5)
WBC: 9.4 10*3/uL (ref 4.0–10.5)

## 2013-08-09 LAB — COMPREHENSIVE METABOLIC PANEL
ALT: 48 U/L (ref 0–53)
AST: 58 U/L — ABNORMAL HIGH (ref 0–37)
Albumin: 3.6 g/dL (ref 3.5–5.2)
Alkaline Phosphatase: 207 U/L — ABNORMAL HIGH (ref 39–117)
BUN: 7 mg/dL (ref 6–23)
CALCIUM: 9.1 mg/dL (ref 8.4–10.5)
CO2: 26 meq/L (ref 19–32)
Chloride: 99 mEq/L (ref 96–112)
Creatinine, Ser: 0.78 mg/dL (ref 0.50–1.35)
GFR calc non Af Amer: >90 mL/min (ref >90)
GLUCOSE: 97 mg/dL (ref 70–99)
Potassium: 3.5 mEq/L — ABNORMAL LOW (ref 3.7–5.3)
SODIUM: 138 meq/L (ref 137–147)
Total Bilirubin: 2.2 mg/dL — ABNORMAL HIGH (ref 0.3–1.2)
Total Protein: 8 g/dL (ref 6.0–8.3)

## 2013-08-09 LAB — RETICULOCYTES
RBC.: 3.71 MIL/uL — ABNORMAL LOW (ref 4.22–5.81)
RETIC COUNT ABSOLUTE: 141 10*3/uL (ref 19.0–186.0)
Retic Ct Pct: 3.8 % — ABNORMAL HIGH (ref 0.4–3.1)

## 2013-08-09 MED ORDER — HYDROMORPHONE HCL PF 1 MG/ML IJ SOLN
1.0000 mg | Freq: Once | INTRAMUSCULAR | Status: AC
  Administered 2013-08-09: 1 mg via INTRAVENOUS
  Filled 2013-08-09: qty 1

## 2013-08-09 MED ORDER — HYDROMORPHONE HCL PF 2 MG/ML IJ SOLN
2.0000 mg | Freq: Once | INTRAMUSCULAR | Status: AC
  Administered 2013-08-09: 2 mg via INTRAVENOUS
  Filled 2013-08-09: qty 1

## 2013-08-09 MED ORDER — SODIUM CHLORIDE 0.9 % IV BOLUS (SEPSIS)
1000.0000 mL | Freq: Once | INTRAVENOUS | Status: AC
  Administered 2013-08-09: 1000 mL via INTRAVENOUS

## 2013-08-09 MED ORDER — KETOROLAC TROMETHAMINE 15 MG/ML IJ SOLN
30.0000 mg | Freq: Once | INTRAMUSCULAR | Status: AC
  Administered 2013-08-09: 30 mg via INTRAVENOUS
  Filled 2013-08-09: qty 2

## 2013-08-09 NOTE — ED Notes (Signed)
Patient is alert and oriented x3.  He is complaining of chest and extremities pain that got worse today.  EMS 12 lead is NSR.  Patient is complaining of increased pain with deep inspiration. Currently he rates his pain 10 of 10.

## 2013-08-09 NOTE — ED Provider Notes (Signed)
CSN: [id number]     Arrival date & time 08/09/13  2137 History   First MD Initiated Contact with Patient 08/09/13 2223     Chief Complaint  Patient presents with  . Sickle Cell Pain Crisis     (Consider location/radiation/quality/duration/timing/severity/associated sxs/prior Treatment) HPI Pt is a 23yo male with hx of sickle cell disease presenting with sickle cell pain crisis that started on Sunday, 2/22, has progressively worsened. Pt was seen in ED at that time, tx for pain but states he wanted to leave to be home with his family during the snow storm. Pt is back today stating his pain has not improved. Pain is in chest, back, and bilateral legs.  Pain feels similar to sickle cell pain, 8/10, not controlled with home medication, ibuprofen and hydrocodone. Pt evaluated and tx at [facility] cell clinic yesterday, advised to return to ED if symptoms not managed at home.  Denies fever, n/v/d. Denies recent illness. Does report hx of acute chest syndrome last year.   Past Medical History  Diagnosis Date  . Sickle cell disease    Past Surgical History  Procedure Laterality Date  . Cholecystectomy     History reviewed. No pertinent family history. History  Substance Use Topics  . Smoking status: Former Games developer  . Smokeless tobacco: Never Used  . Alcohol Use: Yes     Comment: occasionally    Review of Systems  Constitutional: Positive for appetite change. Negative for fever and chills.  Respiratory: Negative for cough and shortness of breath.   Cardiovascular: Positive for chest pain.  Gastrointestinal: Negative for nausea, vomiting, abdominal pain, diarrhea and constipation.  Musculoskeletal: Positive for arthralgias, back pain and myalgias.  All other systems reviewed and are negative.      Allergies  Shellfish allergy  Home Medications   Current Outpatient Rx  Name  Route  Sig  Dispense  Refill  . folic acid (FOLVITE) 800 MCG tablet   Oral   Take 800 mcg by mouth daily.          Marland Kitchen HYDROcodone-acetaminophen (NORCO/VICODIN) 5-325 MG per tablet   Oral   Take 1 tablet by mouth every 6 (six) hours as needed for severe pain.   60 tablet   0   . ibuprofen (ADVIL,MOTRIN) 600 MG tablet   Oral   Take 1 tablet (600 mg total) by mouth every 6 (six) hours as needed for mild pain or moderate pain.   30 tablet   1   . oxyCODONE-acetaminophen (PERCOCET/ROXICET) 5-325 MG per tablet   Oral   Take 1-2 tablets by mouth every 4 (four) hours as needed for severe pain.   10 tablet   0    BP 107/58  Pulse 90  Resp 14  SpO2 100% Physical Exam  Nursing note and vitals reviewed. Constitutional: He appears well-developed and well-nourished.  Pt sitting in exam bed, appears well, non-toxic, NAD.  HENT:  Head: Normocephalic and atraumatic.  Eyes: Conjunctivae are normal. No scleral icterus.  Neck: Normal range of motion.  Cardiovascular: Normal rate, regular rhythm and normal heart sounds.   Pulmonary/Chest: Effort normal and breath sounds normal. No respiratory distress. He has no wheezes. He has no rales. He exhibits tenderness.  No respiratory distress, able to speak in full sentences w/o difficulty. Lungs: CTAB. Anterior chest wall tenderness.  Abdominal: Soft. Bowel sounds are normal. He exhibits no distension and no mass. There is no tenderness. There is no rebound and no guarding.  Soft, non-distended, non-tender.  Musculoskeletal: Normal range of motion. He exhibits tenderness ( bilateral leg bony tenderness and lower back.). He exhibits no edema.  Neurological: He is alert.  Skin: Skin is warm and dry.    ED Course  Procedures (including critical care time) Labs Review Labs Reviewed  CBC WITH DIFFERENTIAL - Abnormal; Notable for the following:    RBC 3.71 (*)    Hemoglobin 10.1 (*)    HCT 27.8 (*)    MCV 74.9 (*)    MCHC 36.3 (*)    RDW 16.4 (*)    Platelets 109 (*)    Monocytes Absolute 1.1 (*)    All other components within normal limits   COMPREHENSIVE METABOLIC PANEL - Abnormal; Notable for the following:    Potassium 3.5 (*)    AST 58 (*)    Alkaline Phosphatase 207 (*)    Total Bilirubin 2.2 (*)    All other components within normal limits  RETICULOCYTES - Abnormal; Notable for the following:    Retic Ct Pct 3.8 (*)    RBC. 3.71 (*)    All other components within normal limits   Imaging Review Dg Chest 2 View  08/09/2013   CLINICAL DATA:  Sickle cell pain crisis.  Chest pain.  EXAM: CHEST  2 VIEW  COMPARISON:  Chest radiograph performed 08/05/2013  FINDINGS: The lungs are well-aerated. Minimal right basilar opacity could reflect mild acute chest syndrome, or possibly atelectasis. There is no evidence of pleural effusion or pneumothorax.  The heart is normal in size; the mediastinal contour is within normal limits. No acute osseous abnormalities are seen. Clips are noted within the right upper quadrant, reflecting prior cholecystectomy.  IMPRESSION: Minimal right basilar airspace opacity could reflect mild acute chest syndrome, or possibly atelectasis.   Electronically Signed   By: Roanna RaiderJeffery  Chang M.D.   On: 08/09/2013 23:00    EKG Interpretation   None       MDM   Final diagnoses:  Sickle cell crisis   Pt with hx of sickle cell disease c/o chest, back, and leg pain since Sunday, 2/22. Pain unable to be managed as home or at sickle cell clinic yesterday. Denies fever or cough.  Pt started on 1L IV NS and 1mg  dilaudid and toradol.  CXR: evidence of mild acute chest syndrome.  11:25 PM  Will tx for acute chest syndrome. Second dose of dilaudid, 2mg  given.  Discussed pt with Dr. Jeraldine LootsLockwood, will consult medicine to admit pt. Pt stable at this time.   Consulted with Dr. Gonzella Lexhungel who recommended attempting to tx pt pain in ED due to pt being discharged from sickle cell clinic yesterday. If unable to tx, will admit.    12:58 AM: pt states pain has improved after IV Dilaudid.  Pt states he feels comfortable being  discharged home with pain medication and f[facility]u [facility] with Dr. Ashley RoyaltyMatthews in the morning. Pt may be safely discharge home as he appear well, non-toxic, no SOB, no hypoxia, and is afebrile. Rx: percocet. Return precautions provided. Pt verbalized understanding and agreement with tx plan.    Junius FinnerErin O'Malley, PA-C 08/10/13 [phone]

## 2013-08-10 ENCOUNTER — Ambulatory Visit (INDEPENDENT_AMBULATORY_CARE_PROVIDER_SITE_OTHER): Payer: BC Managed Care – PPO | Admitting: Family Medicine

## 2013-08-10 ENCOUNTER — Encounter (HOSPITAL_COMMUNITY): Payer: Self-pay | Admitting: Hematology

## 2013-08-10 ENCOUNTER — Inpatient Hospital Stay (HOSPITAL_COMMUNITY)
Admission: AD | Admit: 2013-08-10 | Discharge: 2013-08-14 | DRG: 812 | Disposition: A | Discharge status: Home / Self Care | Payer: BC Managed Care – PPO | Source: Ambulatory Visit | Attending: Internal Medicine | Admitting: Internal Medicine

## 2013-08-10 ENCOUNTER — Other Ambulatory Visit: Payer: BC Managed Care – PPO

## 2013-08-10 ENCOUNTER — Encounter: Payer: Self-pay | Admitting: Family Medicine

## 2013-08-10 ENCOUNTER — Telehealth (HOSPITAL_COMMUNITY): Payer: Self-pay | Admitting: Hematology

## 2013-08-10 VITALS — BP 107/55 | HR 94 | Temp 98.3°F | Resp 18 | Ht 68.0 in | Wt 137.0 lb

## 2013-08-10 DIAGNOSIS — R079 Chest pain, unspecified: Secondary | ICD-10-CM

## 2013-08-10 DIAGNOSIS — Z791 Long term (current) use of non-steroidal anti-inflammatories (NSAID): Secondary | ICD-10-CM

## 2013-08-10 DIAGNOSIS — Z87891 Personal history of nicotine dependence: Secondary | ICD-10-CM

## 2013-08-10 DIAGNOSIS — Z79899 Other long term (current) drug therapy: Secondary | ICD-10-CM

## 2013-08-10 DIAGNOSIS — D5700 Hb-SS disease with crisis, unspecified: Principal | ICD-10-CM

## 2013-08-10 DIAGNOSIS — D571 Sickle-cell disease without crisis: Secondary | ICD-10-CM | POA: Insufficient documentation

## 2013-08-10 DIAGNOSIS — E876 Hypokalemia: Secondary | ICD-10-CM | POA: Diagnosis present

## 2013-08-10 DIAGNOSIS — H9190 Unspecified hearing loss, unspecified ear: Secondary | ICD-10-CM

## 2013-08-10 DIAGNOSIS — R52 Pain, unspecified: Secondary | ICD-10-CM | POA: Diagnosis present

## 2013-08-10 DIAGNOSIS — E860 Dehydration: Secondary | ICD-10-CM | POA: Diagnosis present

## 2013-08-10 DIAGNOSIS — D57219 Sickle-cell/Hb-C disease with crisis, unspecified: Secondary | ICD-10-CM

## 2013-08-10 MED ORDER — KETOROLAC TROMETHAMINE 15 MG/ML IJ SOLN
30.0000 mg | Freq: Once | INTRAMUSCULAR | Status: AC
  Administered 2013-08-10: 30 mg via INTRAVENOUS
  Filled 2013-08-10: qty 2

## 2013-08-10 MED ORDER — FOLIC ACID 800 MCG PO TABS: 800.0000 ug | ORAL_TABLET | Freq: Every day | ORAL | Status: DC

## 2013-08-10 MED ORDER — HYDROMORPHONE HCL PF 2 MG/ML IJ SOLN
1.5000 mg | Freq: Once | INTRAMUSCULAR | Status: AC
  Administered 2013-08-10: 1.5 mg via INTRAVENOUS
  Filled 2013-08-10: qty 1

## 2013-08-10 MED ORDER — DIPHENHYDRAMINE HCL 12.5 MG/5ML PO ELIX: 12.5000 mg | ORAL_SOLUTION | Freq: Four times a day (QID) | ORAL | Status: DC | PRN

## 2013-08-10 MED ORDER — FOLIC ACID 1 MG PO TABS
1.0000 mg | ORAL_TABLET | Freq: Every day | ORAL | Status: DC
  Administered 2013-08-11 – 2013-08-14 (×4): 1 mg via ORAL
  Filled 2013-08-10 (×4): qty 1

## 2013-08-10 MED ORDER — HYDROMORPHONE HCL PF 2 MG/ML IJ SOLN
1.8000 mg | Freq: Once | INTRAMUSCULAR | Status: AC
  Administered 2013-08-10: 1.8 mg via INTRAVENOUS
  Filled 2013-08-10: qty 1

## 2013-08-10 MED ORDER — SODIUM CHLORIDE 0.9 % IJ SOLN: 9.0000 mL | INTRAMUSCULAR | Status: DC | PRN

## 2013-08-10 MED ORDER — HYDROMORPHONE HCL PF 2 MG/ML IJ SOLN
2.0000 mg | INTRAMUSCULAR | Status: AC
  Administered 2013-08-10 (×2): 2 mg via INTRAVENOUS
  Filled 2013-08-10 (×2): qty 1

## 2013-08-10 MED ORDER — OXYCODONE-ACETAMINOPHEN 5-325 MG PO TABS: 1.0000 | ORAL_TABLET | ORAL | Status: DC | PRN

## 2013-08-10 MED ORDER — DEXTROSE-NACL 5-0.45 % IV SOLN
INTRAVENOUS | Status: DC
  Administered 2013-08-10 – 2013-08-14 (×5): via INTRAVENOUS

## 2013-08-10 MED ORDER — HYDROMORPHONE 0.3 MG/ML IV SOLN
INTRAVENOUS | Status: DC
  Administered 2013-08-10 (×2): via INTRAVENOUS
  Administered 2013-08-10: 3 mg via INTRAVENOUS
  Administered 2013-08-11: 6.29 mg via INTRAVENOUS
  Administered 2013-08-11: 17:00:00 via INTRAVENOUS
  Administered 2013-08-11: 3 mg via INTRAVENOUS
  Administered 2013-08-11 (×2): via INTRAVENOUS
  Administered 2013-08-11: 10.64 mg via INTRAVENOUS
  Administered 2013-08-11: 8.3 mg via INTRAVENOUS
  Administered 2013-08-11: 11:00:00 via INTRAVENOUS
  Administered 2013-08-12: 6.9 mg via INTRAVENOUS
  Administered 2013-08-12: 21:00:00 via INTRAVENOUS
  Administered 2013-08-12: 6.3 mg via INTRAVENOUS
  Administered 2013-08-12: 3 mg via INTRAVENOUS
  Administered 2013-08-12: 5.21 mg via INTRAVENOUS
  Administered 2013-08-12 – 2013-08-13 (×2): via INTRAVENOUS
  Administered 2013-08-13 (×2): 0.6 mg via INTRAVENOUS
  Administered 2013-08-13: 9.41 mg via INTRAVENOUS
  Administered 2013-08-13: 0.5999 mg via INTRAVENOUS
  Administered 2013-08-13 – 2013-08-14 (×2): 0.01 mg via INTRAVENOUS
  Administered 2013-08-14: 1.8 mg via INTRAVENOUS
  Administered 2013-08-14: 0.6 mg via INTRAVENOUS
  Filled 2013-08-10 (×12): qty 25

## 2013-08-10 MED ORDER — DIPHENHYDRAMINE HCL 50 MG/ML IJ SOLN
12.5000 mg | Freq: Four times a day (QID) | INTRAMUSCULAR | Status: DC | PRN
  Administered 2013-08-10: 12.5 mg via INTRAVENOUS
  Filled 2013-08-10 (×2): qty 1

## 2013-08-10 MED ORDER — ONDANSETRON HCL 4 MG/2ML IJ SOLN: 4.0000 mg | Freq: Four times a day (QID) | INTRAMUSCULAR | Status: DC | PRN

## 2013-08-10 MED ORDER — HYDROMORPHONE HCL PF 2 MG/ML IJ SOLN
1.5000 mg | Freq: Once | INTRAMUSCULAR | Status: AC
  Administered 2013-08-11: 1.5 mg via INTRAVENOUS
  Filled 2013-08-10: qty 1

## 2013-08-10 MED ORDER — HYDROMORPHONE HCL PF 2 MG/ML IJ SOLN
1.2000 mg | Freq: Once | INTRAMUSCULAR | Status: AC
  Administered 2013-08-10: 1.2 mg via INTRAVENOUS
  Filled 2013-08-10: qty 1

## 2013-08-10 MED ORDER — NALOXONE HCL 0.4 MG/ML IJ SOLN: 0.4000 mg | INTRAMUSCULAR | Status: DC | PRN

## 2013-08-10 NOTE — Progress Notes (Signed)
   Subjective:    Patient ID: William Jennings, male    DOB: 1990/10/30, 23 y.o.   MRN: [id number]  HPI Patient went to the emergency room on 08/09/2013 via ambulance for chest pains at 8:45 pm. A chest x-ray was done that showed mild atelectasis that is consistent with sickle cell anemia. Patient was treated with IV fluids, IV pain medications, and was released at 0200.  Was instructed to follow up with primary physician in the am.  Patient states that chest feels heavy and tender to touch. Patient states that these symptoms started on Wednesday. Patient states that it hurts to breath in and out. Maintains that if he raises his voice it hurts.  Reports that left chest is tender to touch. Current pain intensity is a 7/10 dull ache to left chest. Patient also having pain in lower back and bilateral knees consistent with a sickle cell crisis.   Review of Systems  Constitutional: Positive for fatigue.  Eyes: Negative.   Respiratory: Positive for chest tightness and shortness of breath.   Endocrine: Negative.   Genitourinary: Negative.   Musculoskeletal: Positive for back pain and myalgias (bilateral knees).  Skin: Negative.   Neurological: Positive for weakness.       Objective:   Physical Exam  Constitutional: He is oriented to person, place, and time. He has a sickly appearance.  HENT:  Head: Normocephalic and atraumatic.  Eyes: Conjunctivae are normal. Pupils are equal, round, and reactive to light.  Neck: Normal range of motion. Neck supple.  Cardiovascular: Regular rhythm, normal heart sounds and normal pulses.   Pulmonary/Chest: Not tachypneic. No respiratory distress. He has decreased breath sounds in the left lower field.    Abdominal: Soft. Bowel sounds are normal. There is no tenderness.  Musculoskeletal: He exhibits tenderness.       Right knee: He exhibits decreased range of motion. He exhibits no swelling. Tenderness found. Patellar tendon tenderness noted.       Left knee: He  exhibits decreased range of motion and erythema. He exhibits no swelling. Tenderness found.       Lumbar back: He exhibits decreased range of motion, tenderness and pain. He exhibits no swelling.  Neurological: He is alert and oriented to person, place, and time. He has normal reflexes.  Skin: Skin is warm and dry.  Psychiatric: He has a normal mood and affect.          Assessment & Plan:   1. Chest pain: [facility]. Will start IVF, IV analgesics and IV Toradol. Will consider transition to inpatient care if unable to manage pain at home.

## 2013-08-10 NOTE — H&P (Signed)
William Jennings is an 23 y.o. male.   Chief Complaint: Generalized pain HPI: A 23 yo with known history of Sickle cell disease here with sickle cell painful crisis. Patient has tried outpatient therapy with no help. Has been to the [facility] with initial respiratory symptoms that got better but pain persisted. He has pain at 7/10 now, no NVD, No Diaphoresis.  Past Medical History  Diagnosis Date  . Sickle cell disease     Past Surgical History  Procedure Laterality Date  . Cholecystectomy      History reviewed. No pertinent family history. Social History:  reports that he has quit smoking. He has never used smokeless tobacco. He reports that he drinks alcohol. He reports that he does not use illicit drugs.  Allergies:  Allergies  Allergen Reactions  . Shellfish Allergy Anaphylaxis    Medications Prior to Admission  Medication Sig Dispense Refill  . folic acid (FOLVITE) 841 MCG tablet Take 800 mcg by mouth daily.      Marland Kitchen HYDROcodone-acetaminophen (NORCO/VICODIN) 5-325 MG per tablet Take 1 tablet by mouth every 6 (six) hours as needed for severe pain.  60 tablet  0  . ibuprofen (ADVIL,MOTRIN) 600 MG tablet Take 1 tablet (600 mg total) by mouth every 6 (six) hours as needed for mild pain or moderate pain.  30 tablet  1  . oxyCODONE-acetaminophen (PERCOCET/ROXICET) 5-325 MG per tablet Take 1-2 tablets by mouth every 4 (four) hours as needed for severe pain.  10 tablet  0    Results for orders placed during the hospital encounter of 08/09/13 (from the past 48 hour(s))  CBC WITH DIFFERENTIAL     Status: Abnormal   Collection Time    08/09/13 10:00 PM      Result Value Ref Range   WBC 9.4  4.0 - 10.5 K/uL   RBC 3.71 (*) 4.22 - 5.81 MIL/uL   Hemoglobin 10.1 (*) 13.0 - 17.0 g/dL   HCT 27.8 (*) 39.0 - 52.0 %   MCV 74.9 (*) 78.0 - 100.0 fL   MCH 27.2  26.0 - 34.0 pg   MCHC 36.3 (*) 30.0 - 36.0 g/dL   RDW 16.4 (*) 11.5 - 15.5 %   Platelets 109 (*) 150 - 400 K/uL    Comment: SPECIMEN CHECKED FOR CLOTS     REPEATED TO VERIFY     PLATELET COUNT CONFIRMED BY SMEAR   Neutrophils Relative % 67  43 - 77 %   Lymphocytes Relative 21  12 - 46 %   Monocytes Relative 12  3 - 12 %   Eosinophils Relative 0  0 - 5 %   Basophils Relative 0  0 - 1 %   Neutro Abs 6.3  1.7 - 7.7 K/uL   Lymphs Abs 2.0  0.7 - 4.0 K/uL   Monocytes Absolute 1.1 (*) 0.1 - 1.0 K/uL   Eosinophils Absolute 0.0  0.0 - 0.7 K/uL   Basophils Absolute 0.0  0.0 - 0.1 K/uL   RBC Morphology TARGET CELLS     Comment: TEARDROP CELLS     POLYCHROMASIA PRESENT   WBC Morphology ATYPICAL LYMPHOCYTES     Comment: INCREASED BANDS (>20% BANDS)   Smear Review LARGE PLATELETS PRESENT    COMPREHENSIVE METABOLIC PANEL     Status: Abnormal   Collection Time    08/09/13 10:00 PM      Result Value Ref Range   Sodium 138  137 - 147 mEq/L   Potassium 3.5 (*)  3.7 - 5.3 mEq/L   Chloride 99  96 - 112 mEq/L   CO2 26  19 - 32 mEq/L   Glucose, Bld 97  70 - 99 mg/dL   BUN 7  6 - 23 mg/dL   Creatinine, Ser 6.54  0.50 - 1.35 mg/dL   Calcium 9.1  8.4 - 27.1 mg/dL   Total Protein 8.0  6.0 - 8.3 g/dL   Albumin 3.6  3.5 - 5.2 g/dL   AST 58 (*) 0 - 37 U/L   ALT 48  0 - 53 U/L   Alkaline Phosphatase 207 (*) 39 - 117 U/L   Total Bilirubin 2.2 (*) 0.3 - 1.2 mg/dL   GFR calc non Af Amer >90  >90 mL/min   GFR calc Af Amer >90  >90 mL/min   Comment: (NOTE)     The eGFR has been calculated using the CKD EPI equation.     This calculation has not been validated in all clinical situations.     eGFR's persistently <90 mL/min signify possible Chronic Kidney     Disease.  RETICULOCYTES     Status: Abnormal   Collection Time    08/09/13 10:00 PM      Result Value Ref Range   Retic Ct Pct 3.8 (*) 0.4 - 3.1 %   RBC. 3.71 (*) 4.22 - 5.81 MIL/uL   Retic Count, Manual 141.0  19.0 - 186.0 K/uL   Dg Chest 2 View  08/09/2013   CLINICAL DATA:  Sickle cell pain crisis.  Chest pain.  EXAM: CHEST  2 VIEW  COMPARISON:  Chest  radiograph performed 08/05/2013  FINDINGS: The lungs are well-aerated. Minimal right basilar opacity could reflect mild acute chest syndrome, or possibly atelectasis. There is no evidence of pleural effusion or pneumothorax.  The heart is normal in size; the mediastinal contour is within normal limits. No acute osseous abnormalities are seen. Clips are noted within the right upper quadrant, reflecting prior cholecystectomy.  IMPRESSION: Minimal right basilar airspace opacity could reflect mild acute chest syndrome, or possibly atelectasis.   Electronically Signed   By: Roanna Raider M.D.   On: 08/09/2013 23:00    Review of Systems  Constitutional: Positive for malaise/fatigue.  Eyes: Negative.   Respiratory: Negative.   Cardiovascular: Negative.   Gastrointestinal: Negative.   Genitourinary: Negative.   Musculoskeletal: Positive for back pain, joint pain, myalgias and neck pain.  Skin: Negative.   Neurological: Negative.   Endo/Heme/Allergies: Negative.   Psychiatric/Behavioral: Negative.     Blood pressure 130/97, pulse 74, temperature 98.1 F (36.7 C), temperature source Oral, resp. rate 18, height 5\' 8"  (1.727 m), weight 62.642 kg (138 lb 1.6 oz), SpO2 100.00%. Physical Exam  Constitutional: He is oriented to person, place, and time. He appears well-developed and well-nourished.  HENT:  Head: Normocephalic and atraumatic.  Right Ear: External ear normal.  Left Ear: External ear normal.  Eyes: Conjunctivae are normal. Pupils are equal, round, and reactive to light.  Neck: Normal range of motion. Neck supple.  Cardiovascular: Normal rate, regular rhythm, normal heart sounds and intact distal pulses.   Respiratory: Effort normal and breath sounds normal.  GI: Soft. Bowel sounds are normal.  Musculoskeletal: He exhibits tenderness. He exhibits no edema.  Neurological: He is alert and oriented to person, place, and time. He has normal reflexes.  Skin: Skin is warm and dry.   Psychiatric: He has a normal mood and affect. His behavior is normal.     Assessment/Plan A  23 yo man with sickle cel painful crisis.  #1 Sickle cell Painful crisis: patient will be admitted to the main hospital. We will continue with dilaudid PCA, Hydration, Toradol, benadryl and supportive care.  #2 Sickle cell Anemia: Will follow H/H.  GARBA,LAWAL 08/10/2013, 7:12 PM

## 2013-08-10 NOTE — Telephone Encounter (Signed)
Pt called c/o chest pain since 08/09/2013. Pt stated that he went to the ED yesterday.  Pt stated that he had a CXR which apparantly showed acute chest syndrome. Pt states that the ED sent him back home because he did not have a fever.  Armeniahina Hollis NP notified and she spoke to the pt on the phone. Armeniahina to notify Dr Mikeal HawthorneGarba

## 2013-08-10 NOTE — Telephone Encounter (Signed)
Called pt at home to ask [facility] to come to [facility] to see Armeniahina NP ASAP.  Pt states that he will call a taxi now and will be here within 30 minutes

## 2013-08-10 NOTE — Progress Notes (Signed)
Patient seen and examined. Agree with note above.

## 2013-08-10 NOTE — Discharge Instructions (Signed)
Be sure to follow up in the [facility] tomorrow with Dr. Ashley RoyaltyMatthews. Take pain medication as prescribed. Do not drive while taking as it may cause drowsiness. Return to ER if you develop difficulty breathing or fever, Temperature greater than 100.4 F.  Sickle Cell Anemia, Adult Sickle cell anemia is a condition where your red blood cells are shaped like sickles. Red blood cells carry oxygen through the body. Sickle-shaped red blood cells cells do not live as long as normal red blood cells. They also clump together and block blood from flowing through the blood vessels. These things prevent the body from getting enough oxygen. Sickle cell anemia causes organ damage and pain. It also increases the risk of infection. HOME CARE  Drink enough fluid to keep your pee (urine) clear or pale yellow. Drink more in hot weather and during exercise.  Do not smoke. Smoking lowers oxygen levels in the blood.  Only take over-the-counter or prescription medicines as told by your doctor.  Take antibiotic medicines as told by your doctor. Make sure you finish them it even if you start to feel better.  Take supplements as told by your doctor.  Consider wearing a medical alert bracelet. This tells anyone caring for you in an emergency of your condition.  When traveling, keep your medical information, doctors' names, and the medicines you take with you at all times.  If you have a fever, do not take fever medicines right away. This could cover up a problem. Tell your doctor.   Keep all follow-up appointments with your doctor. Sickle cell anemia requires regular medical care. GET HELP IF: You have a fever. GET HELP RIGHT AWAY IF:  You feel dizzy or faint.  You have new belly (abdominal) pain, especially on the left side near the stomach area.  You have a lasting, often uncomfortable and painful erection of the penis (priapism). If it is not treated right away, you will become unable to have sex  (impotence).  You have numbness your arms or legs or you have a hard time moving them.  You have a hard time talking.  You have a fever or lasting symptoms for more than 2 3 days.  You have a fever and your symptoms suddenly get worse.  You have signs or symptoms of infection. These include:  Chills.  Being more tired than normal (lethargy).  Irritability.  Poor eating.  Throwing up (vomiting).  You have pain that is not helped with medicine.  You have shortness of breath.  You have pain in your chest.  You are coughing up pus-like or bloody mucus.  You have a stiff neck.  Your feet or hands swell or have pain.  Your belly looks bloated.  Your joints hurt. MAKE SURE YOU:  Understand these instructions.  Will watch your condition.  Will get help right away if you are not doing well or get worse. Document Released: 03/21/2013 Document Reviewed: 01/10/2013 [facility] Patient Information 2014 [employer], [place].

## 2013-08-10 NOTE — Progress Notes (Signed)
Pt being transferred to rm [id number]. Report given to Oralia RudFred RN, Wioll accompany pt over via wheelchair

## 2013-08-10 NOTE — Progress Notes (Signed)
Patient ID: Fonnie JarvisGeovanni D Hussey, male   DOB: 02/03/1991, 23 y.o.   MRN: [id number] Transfer note Date: 08/10/2013  Patient name: Fonnie JarvisGeovanni D Butzin Medical record number: [id number] Date of birth: 02/03/1991 Age: 23 y.o. Gender: male PCP: MATTHEWS,MICHELLE A., MD  Attending physician: Rometta EmeryMohammad L Garba, MD  Chief Complaint: Sickle cell anemia with pain  History of Present Illness: Pt is a 23yo male with hx of sickle cell disease presenting with sickle cell pain crisis that started initially on Sunday, 2/22, has progressively worsened throughout the week. Pt was seen in ED on 2/22 treated for pain and discharged home. [facility], tx for pain but states he wanted to leave to be home with his family during the snow storm. [facility]09/[facility] for acute chest pain and pain to back and knees. Patient was treated with IV pain medications and discharged home. Patient was advised by the ED to report to primary physician on today. Pt was seen in the office today stating his pain has not improved. Pain is currently in chest, back, and bilateral legs. Pain feels similar to sickle cell pain with a 7-8/10 intensity not controlled with home medications. Patient maintains that left chest wall is tender to touch and it is uncomfortable when he breaths in and out. Denies fever, n/v/d, recent illness, but does have a history of acute chest syndrome 1 year ago.    Scheduled Meds: . HYDROmorphone PCA 0.3 mg/mL   Intravenous 6 times per day   Continuous Infusions: . dextrose 5 % and 0.45% NaCl 125 mL/hr at 08/10/13 1414   PRN Meds:.diphenhydrAMINE, diphenhydrAMINE, naloxone, ondansetron (ZOFRAN) IV, sodium chloride Allergies: Shellfish allergy Past Medical History  Diagnosis Date  . Sickle cell disease    Past Surgical History  Procedure Laterality Date  . Cholecystectomy     History reviewed. No pertinent family history. History    Social History  . Marital Status: Single    Spouse Name: N/A    Number of Children: N/A  . Years of Education: N/A   Occupational History  . Not on file.   Social History Main Topics  . Smoking status: Former Games developermoker  . Smokeless tobacco: Never Used  . Alcohol Use: Yes     Comment: occasionally  . Drug Use: No  . Sexual Activity: No   Other Topics Concern  . Not on file   Social History Narrative  . No narrative on file    Review of Systems:  Constitutional: positive for fatigue  Eyes: negative  Ears, nose, mouth, throat, and face: negative  Respiratory: mild dyspnea on exertion  Cardiovascular: negative  Gastrointestinal: negative, poor appetite  Genitourinary:negative  Integument/breast: negative  Hematologic/lymphatic: negative  Musculoskeletal:positive for back pain  Neurological: negative  Behavioral/Psych: negative  Endocrine: negative  Allergic/Immunologic: negative     Physical Exam: No intake or output data in the 24 hours ending 08/10/13 1803 General appearance: alert, cooperative, appears stated age, fatigued and mild distress  Head: Normocephalic, without obvious abnormality, atraumatic  Eyes: negative, conjunctivae/corneas clear. PERRL, EOM's intact. Fundi benign.  Back: symmetric, no curvature. ROM normal. No CVA tenderness., Tender to palpation, decreased ROM  Lungs: clear to auscultation bilaterally  Chest wall: no tenderness, left sided chest wall tenderness  Abdomen: soft, non-tender; bowel sounds normal; no masses, no organomegaly  Extremities: bilateral knees tender to palpation  Skin: Skin color, texture, turgor normal. No rashes or lesions  Neurologic:  Alert and oriented X 3, normal strength and tone. Normal symmetric reflexes. Normal coordination and gait     Lab results:  Recent Labs  08/08/13 1230 08/09/13 2200  NA 139 138  K 3.8 3.5*  CL 99 99  CO2 25 26  GLUCOSE 87 97  BUN 9 7  CREATININE 0.75 0.78  CALCIUM 10.1 9.1     Recent Labs  08/08/13 1230 08/09/13 2200  AST 48* 58*  ALT 35 48  ALKPHOS 232* 207*  BILITOT 2.7* 2.2*  PROT 9.7* 8.0  ALBUMIN 4.6 3.6   No results found for this basename: LIPASE, AMYLASE,  in the last 72 hours  Recent Labs  08/08/13 1230 08/09/13 2200  WBC 10.8* 9.4  NEUTROABS 8.0* 6.3  HGB 12.1* 10.1*  HCT 32.9* 27.8*  MCV 75.6* 74.9*  PLT 101* 109*   No results found for this basename: CKTOTAL, CKMB, CKMBINDEX, TROPONINI,  in the last 72 hours No components found with this basename: POCBNP,  No results found for this basename: DDIMER,  in the last 72 hours No results found for this basename: HGBA1C,  in the last 72 hours No results found for this basename: CHOL, HDL, LDLCALC, TRIG, CHOLHDL, LDLDIRECT,  in the last 72 hours No results found for this basename: TSH, T4TOTAL, FREET3, T3FREE, THYROIDAB,  in the last 72 hours  Recent Labs  08/09/13 2200  RETICCTPCT 3.8*   Imaging results:  Dg Chest 2 View  08/09/2013   CLINICAL DATA:  Sickle cell pain crisis.  Chest pain.  EXAM: CHEST  2 VIEW  COMPARISON:  Chest radiograph performed 08/05/2013  FINDINGS: The lungs are well-aerated. Minimal right basilar opacity could reflect mild acute chest syndrome, or possibly atelectasis. There is no evidence of pleural effusion or pneumothorax.  The heart is normal in size; the mediastinal contour is within normal limits. No acute osseous abnormalities are seen. Clips are noted within the right upper quadrant, reflecting prior cholecystectomy.  IMPRESSION: Minimal right basilar airspace opacity could reflect mild acute chest syndrome, or possibly atelectasis.   Electronically Signed   By: Roanna Raider M.D.   On: 08/09/2013 23:00   Dg Chest 2 View  08/05/2013   CLINICAL DATA:  Sickle cell crisis, low back and bilateral leg pain  EXAM: CHEST  2 VIEW  COMPARISON:  04/02/2013  FINDINGS: Normal heart size, mediastinal contours, and pulmonary vascularity.  Lungs clear.  No pleural  effusion or pneumothorax.  Bones unremarkable.  IMPRESSION: No acute abnormalities.   Electronically Signed   By: Ulyses Southward M.D.   On: 08/05/2013 12:16   Other results: EKG: None   Pt was treated with IVF, Toradol and IV analgesics. Pt was initially treated with weight based rapid re-dosing and then transitioned to a weight based PCA Dilaudid. He had total use of 3 mg with 5 demands and 5 deliveries. Pain has decreased from 7-8/10 to 5/10. Typical pain intensity is 2/10. Patient states that he is unable to manage at home.  Pt will be transferred to inpatient unit. Plan reviewed with Dr. Trena Platt M 08/10/2013, 5:58 PM

## 2013-08-10 NOTE — H&P (Signed)
[facility] History and Physical   Date: 08/10/2013  Patient name: William Jennings Medical record number: [id number] Date of birth: [birth year] Age: 23 y.o. Gender: male PCP: MATTHEWS,MICHELLE A., MD  Attending physician: Rometta EmeryMohammad L Garba, MD  Chief Complaint: Acute pain  History of Present Illness: Pt is a 23yo male with hx of sickle cell disease presenting with sickle cell pain crisis that started initially  on Sunday, 2/22, has progressively worsened throughout the week. Pt was seen in ED on 2/22 treated for pain and discharged home. [facility], tx for pain but states he wanted to leave to be home with his family during the snow storm. [facility] for acute chest pain and pain to back and knees. Patient was treated with IV pain medications and discharged home. Patient was advised by the ED to report to primary physician on today.  Pt was seen in the office  today stating his pain has not improved. Pain is currently in  chest, back, and bilateral legs. Pain feels similar to sickle cell pain with a 7-8/10 intensity not controlled with home medications. Patient maintains that left chest wall is tender to touch and it is uncomfortable when he breaths in and out. Denies fever, n/v/d,  recent illness, but does have a history of acute chest syndrome 1 year ago.     Meds: Prescriptions prior to admission  Medication Sig Dispense Refill  . folic acid (FOLVITE) 800 MCG tablet Take 800 mcg by mouth daily.      Marland Kitchen. HYDROcodone-acetaminophen (NORCO/VICODIN) 5-325 MG per tablet Take 1 tablet by mouth every 6 (six) hours as needed for severe pain.  60 tablet  0  . ibuprofen (ADVIL,MOTRIN) 600 MG tablet Take 1 tablet (600 mg total) by mouth every 6 (six) hours as needed for mild pain or moderate pain.  30 tablet  1  . oxyCODONE-acetaminophen (PERCOCET/ROXICET) 5-325 MG per tablet Take 1-2 tablets by mouth  every 4 (four) hours as needed for severe pain.  10 tablet  0    Allergies: Shellfish allergy Past Medical History  Diagnosis Date  . Sickle cell disease    Past Surgical History  Procedure Laterality Date  . Cholecystectomy     No family history on file. History   Social History  . Marital Status: Single    Spouse Name: N/A    Number of Children: N/A  . Years of Education: N/A   Occupational History  . Not on file.   Social History Main Topics  . Smoking status: Former Games developermoker  . Smokeless tobacco: Never Used  . Alcohol Use: Yes     Comment: occasionally  . Drug Use: No  . Sexual Activity: No   Other Topics Concern  . Not on file   Social History Narrative  . No narrative on file    Review of Systems: Constitutional: positive for fatigue Eyes: negative Ears, nose, mouth, throat, and face: negative Respiratory: mild dyspnea on exertion Cardiovascular: negative Gastrointestinal: negative, poor appetite Genitourinary:negative Integument/breast: negative Hematologic/lymphatic: negative Musculoskeletal:positive for back pain Neurological: negative Behavioral/Psych: negative Endocrine: negative Allergic/Immunologic: negative  Physical Exam: Blood pressure 107/92, pulse 103, temperature 98.6 F (37 C), resp. rate 18, height 5\' 8"  (1.727 m), weight 137 lb (62.143 kg), SpO2 100.00%. General appearance: alert, cooperative, appears stated age, fatigued and mild distress Head: Normocephalic, without obvious abnormality, atraumatic Eyes: negative, conjunctivae/corneas clear. PERRL, EOM's intact. Fundi  benign. Back: symmetric, no curvature. ROM normal. No CVA tenderness., Tender to palpation, decreased ROM Lungs: clear to auscultation bilaterally Chest wall: no tenderness, left sided chest wall tenderness Abdomen: soft, non-tender; bowel sounds normal; no masses,  no organomegaly Extremities: bilateral knees tender to palpation Skin: Skin color, texture, turgor  normal. No rashes or lesions Neurologic: Alert and oriented X 3, normal strength and tone. Normal symmetric reflexes. Normal coordination and gait  Lab results: Results for orders placed during the hospital encounter of 08/09/13 (from the past 24 hour(s))  CBC WITH DIFFERENTIAL     Status: Abnormal   Collection Time    08/09/13 10:00 PM      Result Value Ref Range   WBC 9.4  4.0 - 10.5 K/uL   RBC 3.71 (*) 4.22 - 5.81 MIL/uL   Hemoglobin 10.1 (*) 13.0 - 17.0 g/dL   HCT 16.1 (*) 09.6 - 04.5 %   MCV 74.9 (*) 78.0 - 100.0 fL   MCH 27.2  26.0 - 34.0 pg   MCHC 36.3 (*) 30.0 - 36.0 g/dL   RDW 40.9 (*) 81.1 - 91.4 %   Platelets 109 (*) 150 - 400 K/uL   Neutrophils Relative % 67  43 - 77 %   Lymphocytes Relative 21  12 - 46 %   Monocytes Relative 12  3 - 12 %   Eosinophils Relative 0  0 - 5 %   Basophils Relative 0  0 - 1 %   Neutro Abs 6.3  1.7 - 7.7 K/uL   Lymphs Abs 2.0  0.7 - 4.0 K/uL   Monocytes Absolute 1.1 (*) 0.1 - 1.0 K/uL   Eosinophils Absolute 0.0  0.0 - 0.7 K/uL   Basophils Absolute 0.0  0.0 - 0.1 K/uL   RBC Morphology TARGET CELLS     WBC Morphology ATYPICAL LYMPHOCYTES     Smear Review LARGE PLATELETS PRESENT    COMPREHENSIVE METABOLIC PANEL     Status: Abnormal   Collection Time    08/09/13 10:00 PM      Result Value Ref Range   Sodium 138  137 - 147 mEq/L   Potassium 3.5 (*) 3.7 - 5.3 mEq/L   Chloride 99  96 - 112 mEq/L   CO2 26  19 - 32 mEq/L   Glucose, Bld 97  70 - 99 mg/dL   BUN 7  6 - 23 mg/dL   Creatinine, Ser 7.82  0.50 - 1.35 mg/dL   Calcium 9.1  8.4 - 95.6 mg/dL   Total Protein 8.0  6.0 - 8.3 g/dL   Albumin 3.6  3.5 - 5.2 g/dL   AST 58 (*) 0 - 37 U/L   ALT 48  0 - 53 U/L   Alkaline Phosphatase 207 (*) 39 - 117 U/L   Total Bilirubin 2.2 (*) 0.3 - 1.2 mg/dL   GFR calc non Af Amer >90  >90 mL/min   GFR calc Af Amer >90  >90 mL/min  RETICULOCYTES     Status: Abnormal   Collection Time    08/09/13 10:00 PM      Result Value Ref Range   Retic Ct Pct 3.8  (*) 0.4 - 3.1 %   RBC. 3.71 (*) 4.22 - 5.81 MIL/uL   Retic Count, Manual 141.0  19.0 - 186.0 K/uL    Imaging results:  Dg Chest 2 View  08/09/2013   CLINICAL DATA:  Sickle cell pain crisis.  Chest pain.  EXAM: CHEST  2 VIEW  COMPARISON:  Chest radiograph performed 08/05/2013  FINDINGS: The lungs are well-aerated. Minimal right basilar opacity could reflect mild acute chest syndrome, or possibly atelectasis. There is no evidence of pleural effusion or pneumothorax.  The heart is normal in size; the mediastinal contour is within normal limits. No acute osseous abnormalities are seen. Clips are noted within the right upper quadrant, reflecting prior cholecystectomy.  IMPRESSION: Minimal right basilar airspace opacity could reflect mild acute chest syndrome, or possibly atelectasis.   Electronically Signed   By: Roanna Raider M.D.   On: 08/09/2013 23:00     Assessment & Plan: 1. Sickle cell anemia: Admit for extended observation. IV dilaudid via weight based rapid re-dosing protocol and IV Toradol. If pain intensity remains elevated, will transition to weight based PCA per protocol. Apply K-pad and incentive spirometer.  2. Mild dehydration: Start IVF D5.45 at 125  Clinton Dragone M 08/10/2013, 1:59 PM

## 2013-08-11 LAB — CBC
HEMATOCRIT: 24 % — AB (ref 39.0–52.0)
HEMOGLOBIN: 8.8 g/dL — AB (ref 13.0–17.0)
MCH: 27.2 pg (ref 26.0–34.0)
MCHC: 36.7 g/dL — ABNORMAL HIGH (ref 30.0–36.0)
MCV: 74.1 fL — AB (ref 78.0–100.0)
Platelets: 87 10*3/uL — ABNORMAL LOW (ref 150–400)
RBC: 3.24 MIL/uL — AB (ref 4.22–5.81)
RDW: 16.5 % — AB (ref 11.5–15.5)
WBC: 7.9 10*3/uL (ref 4.0–10.5)

## 2013-08-11 LAB — CREATININE, SERUM: Creatinine, Ser: 0.63 mg/dL (ref 0.50–1.35)

## 2013-08-11 MED ORDER — HYDROMORPHONE HCL PF 2 MG/ML IJ SOLN
2.0000 mg | Freq: Once | INTRAMUSCULAR | Status: AC
  Administered 2013-08-11: 2 mg via INTRAVENOUS
  Filled 2013-08-11: qty 1

## 2013-08-11 MED ORDER — ENOXAPARIN SODIUM 40 MG/0.4ML ~~LOC~~ SOLN
40.0000 mg | SUBCUTANEOUS | Status: DC
  Administered 2013-08-11 – 2013-08-13 (×3): 40 mg via SUBCUTANEOUS
  Filled 2013-08-11 (×4): qty 0.4

## 2013-08-11 MED ORDER — ZOLPIDEM TARTRATE 5 MG PO TABS
5.0000 mg | ORAL_TABLET | Freq: Once | ORAL | Status: AC
  Administered 2013-08-11: 5 mg via ORAL
  Filled 2013-08-11: qty 1

## 2013-08-11 NOTE — ED Provider Notes (Signed)
Medical screening examination/treatment/procedure(s) were performed by non-physician practitioner and as supervising physician I was immediately available for consultation/collaboration.  Gerhard Munchobert Ryin Schillo, MD 08/11/13 [phone]

## 2013-08-11 NOTE — Progress Notes (Signed)
Subjective: A 23 yo admitted with sickle cell painful crisis. Having 6/10 pain on Dilaudid PCA and Toradol. Patient has dehydration and weakness. Patient very sleepy. He is using his Dilaudid sparingly. No fever, no SOB, no NVD.  Objective: Vital signs in last 24 hours: Temp:  [98 F (36.7 C)-98.7 F (37.1 C)] 98.7 F (37.1 C) (02/28 0457) Pulse Rate:  [74-103] 84 (02/28 0457) Resp:  [11-19] 11 (02/28 0600) BP: (101-131)/(55-97) 122/72 mmHg (02/28 0457) SpO2:  [95 %-100 %] 96 % (02/28 0600) FiO2 (%):  [41 %-42 %] 42 % (02/27 1710) Weight:  [62.143 kg (137 lb)-62.642 kg (138 lb 1.6 oz)] 62.642 kg (138 lb 1.6 oz) (02/27 1826) Weight change:  Last BM Date: 08/09/13  Intake/Output from previous day: 02/27 0701 - 02/28 0700 In: 3150.8 [P.O.:840; I.V.:2310.8] Out: 650 [Urine:650] Intake/Output this shift:    General appearance: alert, cooperative and no distress Eyes: conjunctivae/corneas clear. PERRL, EOM's intact. Fundi benign. Neck: no adenopathy, no carotid bruit, no JVD, supple, symmetrical, trachea midline and thyroid not enlarged, symmetric, no tenderness/mass/nodules Back: symmetric, no curvature. ROM normal. No CVA tenderness. Resp: clear to auscultation bilaterally Chest wall: no tenderness Cardio: regular rate and rhythm, S1, S2 normal, no murmur, click, rub or gallop GI: soft, non-tender; bowel sounds normal; no masses,  no organomegaly Extremities: extremities normal, atraumatic, no cyanosis or edema Pulses: 2+ and symmetric Skin: Skin color, texture, turgor normal. No rashes or lesions Neurologic: Grossly normal  Lab Results:  Recent Labs  08/08/13 1230 08/09/13 2200  WBC 10.8* 9.4  HGB 12.1* 10.1*  HCT 32.9* 27.8*  PLT 101* 109*   BMET  Recent Labs  08/08/13 1230 08/09/13 2200  NA 139 138  K 3.8 3.5*  CL 99 99  CO2 25 26  GLUCOSE 87 97  BUN 9 7  CREATININE 0.75 0.78  CALCIUM 10.1 9.1    Studies/Results: Dg Chest 2 View  08/09/2013   CLINICAL  DATA:  Sickle cell pain crisis.  Chest pain.  EXAM: CHEST  2 VIEW  COMPARISON:  Chest radiograph performed 08/05/2013  FINDINGS: The lungs are well-aerated. Minimal right basilar opacity could reflect mild acute chest syndrome, or possibly atelectasis. There is no evidence of pleural effusion or pneumothorax.  The heart is normal in size; the mediastinal contour is within normal limits. No acute osseous abnormalities are seen. Clips are noted within the right upper quadrant, reflecting prior cholecystectomy.  IMPRESSION: Minimal right basilar airspace opacity could reflect mild acute chest syndrome, or possibly atelectasis.   Electronically Signed   By: Roanna RaiderJeffery  Chang M.D.   On: 08/09/2013 23:00    Medications: I have reviewed the patient's current medications.  Assessment/Plan: A 23 yo man with sickle cell disease.  #1 Sickle Cell Painful Crisis: Patient is on Dilaudid PCA and will continue. Will add toradol and continue hydration.  #2 Dehydration: Will hydrate.  #3 Hypokalemia: Will replete.  #4 sickle Cell Anemia: H/H stable.   LOS: 1 day   GARBA,LAWAL 08/11/2013, 8:12 AM

## 2013-08-12 MED ORDER — HYDROCODONE-ACETAMINOPHEN 5-325 MG PO TABS: 1.0000 | ORAL_TABLET | Freq: Four times a day (QID) | ORAL | Status: DC | PRN

## 2013-08-13 ENCOUNTER — Inpatient Hospital Stay (HOSPITAL_COMMUNITY): Payer: BC Managed Care – PPO

## 2013-08-13 LAB — COMPREHENSIVE METABOLIC PANEL
ALBUMIN: 3.1 g/dL — AB (ref 3.5–5.2)
ALK PHOS: 151 U/L — AB (ref 39–117)
ALT: 45 U/L (ref 0–53)
AST: 39 U/L — ABNORMAL HIGH (ref 0–37)
BUN: 3 mg/dL — AB (ref 6–23)
CHLORIDE: 104 meq/L (ref 96–112)
CO2: 25 meq/L (ref 19–32)
Calcium: 9.2 mg/dL (ref 8.4–10.5)
Creatinine, Ser: 0.64 mg/dL (ref 0.50–1.35)
GFR calc Af Amer: >90 mL/min (ref >90)
GFR calc non Af Amer: >90 mL/min (ref >90)
Glucose, Bld: 108 mg/dL — ABNORMAL HIGH (ref 70–99)
Potassium: 3.9 mEq/L (ref 3.7–5.3)
Sodium: 140 mEq/L (ref 137–147)
Total Bilirubin: 1.3 mg/dL — ABNORMAL HIGH (ref 0.3–1.2)
Total Protein: 7.6 g/dL (ref 6.0–8.3)

## 2013-08-13 LAB — CBC WITH DIFFERENTIAL/PLATELET
BASOS PCT: 0 % (ref 0–1)
Basophils Absolute: 0 10*3/uL (ref 0.0–0.1)
Eosinophils Absolute: 0.1 10*3/uL (ref 0.0–0.7)
Eosinophils Relative: 2 % (ref 0–5)
HCT: 23.7 % — ABNORMAL LOW (ref 39.0–52.0)
HEMOGLOBIN: 8.4 g/dL — AB (ref 13.0–17.0)
LYMPHS PCT: 35 % (ref 12–46)
Lymphs Abs: 1.8 10*3/uL (ref 0.7–4.0)
MCH: 26.4 pg (ref 26.0–34.0)
MCHC: 35.4 g/dL (ref 30.0–36.0)
MCV: 74.5 fL — ABNORMAL LOW (ref 78.0–100.0)
MONOS PCT: 11 % (ref 3–12)
Monocytes Absolute: 0.6 10*3/uL (ref 0.1–1.0)
NEUTROS ABS: 2.7 10*3/uL (ref 1.7–7.7)
NEUTROS PCT: 52 % (ref 43–77)
Platelets: 106 10*3/uL — ABNORMAL LOW (ref 150–400)
RBC: 3.18 MIL/uL — ABNORMAL LOW (ref 4.22–5.81)
RDW: 16.9 % — ABNORMAL HIGH (ref 11.5–15.5)
WBC: 5.2 10*3/uL (ref 4.0–10.5)

## 2013-08-13 MED ORDER — ACETAMINOPHEN 325 MG PO TABS
650.0000 mg | ORAL_TABLET | Freq: Four times a day (QID) | ORAL | Status: DC | PRN
  Administered 2013-08-13: 650 mg via ORAL
  Filled 2013-08-13: qty 2

## 2013-08-13 MED ORDER — HYDROMORPHONE HCL PF 2 MG/ML IJ SOLN
2.0000 mg | Freq: Once | INTRAMUSCULAR | Status: AC
  Administered 2013-08-13: 2 mg via INTRAVENOUS
  Filled 2013-08-13: qty 1

## 2013-08-13 NOTE — Progress Notes (Signed)
Subjective: Patient is still complaining gf pain all over but has only used  17.4 g of dilaudid in 24 hours. He is worried that the dose may be too much and he sweats at night when he does not use it so he has been scared. Feels generally unwell today.  Objective: Vital signs in last 24 hours: Temp:  [98 F (36.7 C)-99 F (37.2 C)] 99 F (37.2 C) (03/02 1439) Pulse Rate:  [82-99] 99 (03/02 1439) Resp:  [15-16] 16 (03/02 1439) BP: (106-121)/(55-78) 106/61 mmHg (03/02 1439) SpO2:  [95 %-100 %] 99 % (03/02 1439) FiO2 (%):  [42 %] 42 % (03/02 0344) Weight change:  Last BM Date: 08/09/13  Intake/Output from previous day: 03/01 0701 - 03/02 0700 In: 2845 [P.O.:1220; I.V.:1625] Out: 2400 [Urine:2400] Intake/Output this shift: Total I/O In: 615 [P.O.:240; I.V.:375] Out: 1000 [Urine:1000]  General appearance: alert, cooperative and no distress Eyes: conjunctivae/corneas clear. PERRL, EOM's intact. Fundi benign. Neck: no adenopathy, no carotid bruit, no JVD, supple, symmetrical, trachea midline and thyroid not enlarged, symmetric, no tenderness/mass/nodules Back: symmetric, no curvature. ROM normal. No CVA tenderness. Resp: clear to auscultation bilaterally Cardio: regular rate and rhythm, S1, S2 normal, no murmur, click, rub or gallop GI: soft, non-tender; bowel sounds normal; no masses,  no organomegaly Extremities: extremities normal, atraumatic, no cyanosis or edema Pulses: 2+ and symmetric Skin: Skin color, texture, turgor normal. No rashes or lesions Neurologic: Grossly normal  Lab Results:  Recent Labs  08/11/13 0856 08/13/13 0351  WBC 7.9 5.2  HGB 8.8* 8.4*  HCT 24.0* 23.7*  PLT 87* 106*   BMET  Recent Labs  08/11/13 0856 08/13/13 0351  NA  --  140  K  --  3.9  CL  --  104  CO2  --  25  GLUCOSE  --  108*  BUN  --  3*  CREATININE 0.63 0.64  CALCIUM  --  9.2    Studies/Results: No results found.  Medications: I have reviewed the patient's current  medications.  Assessment/Plan: A 23 Yo man admitted with Sickle cell painful crisis.  #1 Sickle cell painful crisis: patient not using his Dilaudid PCa which will explain his continued pain. I have discussed care plan with him and made it clear that he will not feel any better if he does not use his medications as prescribed. We will give him another day and if he does not use it will be discharged home on oral agents.  #2 Sickle Cell  Anemia: H/H is stable.   #3 Dehydration: Continue hydration.  LOS: 3 days   GARBA,LAWAL 08/13/2013, 3:00 PM

## 2013-08-13 NOTE — Progress Notes (Signed)
08/13/13 1006  Today patient c/o night sweats and body aches.

## 2013-08-14 ENCOUNTER — Encounter: Payer: Self-pay | Admitting: Internal Medicine

## 2013-08-14 LAB — HEMOGLOBINOPATHY EVALUATION
HEMOGLOBIN OTHER: 43.4 % — AB
HGB S QUANTITAION: 51.8 % — AB
Hgb A2 Quant: 3.2 % (ref 2.2–3.2)
Hgb A: 0 % — ABNORMAL LOW (ref 96.8–97.8)
Hgb F Quant: 1.6 % (ref 0.0–2.0)

## 2013-08-14 MED ORDER — HYDROCODONE-ACETAMINOPHEN 5-325 MG PO TABS: 1.0000 | ORAL_TABLET | Freq: Four times a day (QID) | ORAL | Status: DC | PRN

## 2013-08-14 NOTE — Progress Notes (Signed)
Pt refused respiratory virus panel.  Pt education about the importance.  Pt stated "I want to wait for the results of my chest x-ray."

## 2013-08-14 NOTE — Discharge Summary (Signed)
William Jennings MRN: [id number] DOB/AGE: 1991/01/23 23 y.o.  Admit date: 08/10/2013 Discharge date: 08/14/2013  Primary Care Physician:  MATTHEWS,MICHELLE A., MD   Discharge Diagnoses:   Patient Active Problem List   Diagnosis Date Noted  . Sickle cell anemia 08/10/2013  . Chest pain 08/10/2013  . Dehydration 08/08/2013  . Sickle cell crisis 08/06/2013  . 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     HYDROcodone-acetaminophen 5-325 MG per tablet  Commonly known as:  NORCO/VICODIN  Take 1 tablet by mouth every 6 (six) hours as needed for severe pain.     ibuprofen 600 MG tablet  Commonly known as:  ADVIL,MOTRIN  Take 1 tablet (600 mg total) by mouth every 6 (six) hours as needed for mild pain or moderate pain.          Consults:     SIGNIFICANT DIAGNOSTIC STUDIES:  Dg Chest 2 View  08/09/2013   CLINICAL DATA:  Sickle cell pain crisis.  Chest pain.  EXAM: CHEST  2 VIEW  COMPARISON:  Chest radiograph performed 08/05/2013  FINDINGS: The lungs are well-aerated. Minimal right basilar opacity could reflect mild acute chest syndrome, or possibly atelectasis. There is no evidence of pleural effusion or pneumothorax.  The heart is normal in size; the mediastinal contour is within normal limits. No acute osseous abnormalities are seen. Clips are noted within the right upper quadrant, reflecting prior cholecystectomy.  IMPRESSION: Minimal right basilar airspace opacity could reflect mild acute chest syndrome, or possibly atelectasis.   Electronically Signed   By: Roanna Raider M.D.   On: 08/09/2013 23:00   Dg Chest 2 View  08/05/2013   CLINICAL DATA:   Sickle cell crisis, low back and bilateral leg pain  EXAM: CHEST  2 VIEW  COMPARISON:  04/02/2013  FINDINGS: Normal heart size, mediastinal contours, and pulmonary vascularity.  Lungs clear.  No pleural effusion or pneumothorax.  Bones unremarkable.  IMPRESSION: No acute abnormalities.   Electronically Signed   By: Ulyses Southward M.D.   On: 08/05/2013 12:16   Dg Chest Port 1v Same Day  08/13/2013   CLINICAL DATA:  Fever.  History of sickle cell disease.  EXAM: PORTABLE CHEST - 1 VIEW SAME DAY  COMPARISON:  08/09/2013  FINDINGS: 1835 hrs. The lungs are clear without focal consolidation, edema, effusion or pneumothorax. Cardio pericardial silhouette is within normal limits for size. Imaged bony structures of the thorax are intact.  IMPRESSION: No acute cardiopulmonary process.   Electronically Signed   By: Kennith Center M.D.   On: 08/13/2013 19:05    Recent Results (from the past 240 hour(s))  CULTURE, BLOOD (ROUTINE X 2)     Status: None   Collection Time    08/13/13  6:55 PM      Result Value Ref Range Status   Specimen Description BLOOD LEFT ARM   Final   Special Requests BOTTLES DRAWN AEROBIC AND ANAEROBIC 5CC   Final   Culture  Setup Time     Final   Value: 08/13/2013 22:32     Performed at [facility]   Culture     Final   Value:        BLOOD CULTURE RECEIVED NO GROWTH TO DATE CULTURE WILL BE HELD FOR 5 DAYS BEFORE ISSUING A FINAL NEGATIVE REPORT     Performed at [facility]   Report Status PENDING   Incomplete  CULTURE, BLOOD (ROUTINE X 2)     Status: None   Collection Time    08/13/13  7:10 PM      Result Value Ref Range Status   Specimen Description BLOOD LEFT ARM   Final   Special Requests BOTTLES DRAWN AEROBIC AND ANAEROBIC 10CC   Final   Culture  Setup Time     Final   Value: 08/13/2013 22:32     Performed at [facility]   Culture     Final   Value:        BLOOD CULTURE RECEIVED NO GROWTH TO DATE CULTURE WILL BE HELD FOR 5 DAYS BEFORE ISSUING A FINAL  NEGATIVE REPORT     Performed at [facility]   Report Status PENDING   Incomplete    BRIEF ADMITTING H & P: Pt is a 23yo male with hx of sickle cell disease presenting with sickle cell pain crisis that started initially on Sunday, 2/22, has progressively worsened throughout the week. Pt was seen in ED on 2/22 treated for pain and discharged home. Patient was [facility], tx for pain but states he wanted to leave to be home with his family during the snow storm. [facility]09/2013 for acute chest pain and pain to back and knees. Patient was treated with IV pain medications and discharged home. Patient was advised by the ED to report to primary physician on today. Pt was seen in the office today stating his pain has not improved. Pain is currently in chest, back, and bilateral legs. Pain feels similar to sickle cell pain with a 7-8/10 intensity not controlled with home medications. Patient maintains that left chest wall is tender to touch and it is uncomfortable when he breaths in and out. Denies fever, n/v/d, recent illness, but does have a history of acute chest syndrome 1 year ago.     Hospital Course:  Present on Admission:  . Sickle-cell/Hb-C disease with crisis: Pt was admitted with acute vaso-occlusive episode after having failed out patient treatment. He was managed with IVF, Toradol, and Dilaudid delivered via PCA. His pain was initially sustained despite IV analgesics. However, he had a subsequent decrease in pain with adjustment of pain medications. At the time of discharge he had minimal use of the PCA and pain was essentially controlled on oral medications. Pt. Is discharged home on Percocet and Ibuprofen for pain control. A prescription was written for Percocet at discharge.  . Dehydration: Pt presented with clinical dehydration.He was resuscitated with IVF which were discontinued after resolution and patient is  able to maintain hydration by oral intake.  Disposition and Follow-up: Pt discharged home in stable condition. He will follow up in the office on 08/17/2013 with NP Julianne HandlerLaChina Hollis.     Discharge Orders   Future Orders Complete By Expires   Activity as tolerated - No restrictions  As directed    Diet general  As directed       DISCHARGE EXAM:  General: Alert, awake, oriented x3, in no acute distress.  Vital Signs: BP 101/50, HR 87,T 98.3 F (36.8  C), temperature source Oral, RR 18, height 5\' 8"  (1.727 m), weight 145 lb 3.2 oz (65.862 kg), SpO2 100.00%. HEENT: [place]/AT PEERL, EOMI, Anicteric  Heart: Regular rate and rhythm, without murmurs, rubs, gallops.  Lungs: Clear to auscultation, no wheezing or rhonchi noted.  Abdomen: Soft, nontender, nondistended, positive bowel  Skin: Warm and dry Neuro: No focal neurological deficits noted cranial nerves II through XII grossly intact.Strength normal in bilateral upper and lower extremities.  Musculoskeletal: No warm swelling or erythema around joints    Recent Labs  08/13/13 0351  NA 140  K 3.9  CL 104  CO2 25  GLUCOSE 108*  BUN 3*  CREATININE 0.64  CALCIUM 9.2    Recent Labs  08/13/13 0351  AST 39*  ALT 45  ALKPHOS 151*  BILITOT 1.3*  PROT 7.6  ALBUMIN 3.1*   No results found for this basename: LIPASE, AMYLASE,  in the last 72 hours  Recent Labs  08/13/13 0351  WBC 5.2  NEUTROABS 2.7  HGB 8.4*  HCT 23.7*  MCV 74.5*  PLT 106*   Tptal time spent in discharge including face to face and decision making was greater than 30 minutes.  Signed: MATTHEWS,MICHELLE A. 08/14/2013, 10:14 AM

## 2013-08-14 NOTE — H&P (Signed)
Seen and examined. Agree with above

## 2013-08-14 NOTE — Progress Notes (Signed)
Medically stable. DC instructions given.

## 2013-08-14 NOTE — Progress Notes (Signed)
24H PCA use  3.6mg  6 demands 6 deliveries

## 2013-08-15 NOTE — Discharge Summary (Signed)
Pt seen and examined and discussed with NP Julianne HandlerLachina Hollis. I suspect that patient will require admission in the near future however, pt declining admission at time. Pt has no medial derangements to insist on admission so  agree with discharge home.

## 2013-08-15 NOTE — H&P (Signed)
Pt seen and examined and discussed with NP Lachina Hollis. Agree with assessment and plan.  

## 2013-08-17 ENCOUNTER — Ambulatory Visit (INDEPENDENT_AMBULATORY_CARE_PROVIDER_SITE_OTHER): Payer: BC Managed Care – PPO | Admitting: Family Medicine

## 2013-08-17 ENCOUNTER — Encounter: Payer: Self-pay | Admitting: Family Medicine

## 2013-08-17 VITALS — BP 108/59 | HR 81 | Temp 98.0°F | Wt 138.0 lb

## 2013-08-17 DIAGNOSIS — H9192 Unspecified hearing loss, left ear: Secondary | ICD-10-CM

## 2013-08-17 DIAGNOSIS — D571 Sickle-cell disease without crisis: Secondary | ICD-10-CM

## 2013-08-17 DIAGNOSIS — H9190 Unspecified hearing loss, unspecified ear: Secondary | ICD-10-CM

## 2013-08-17 LAB — HGB ELECTROPHORESIS REFLEXED REPORT
HEMOGLOBIN ELECT C: 45 % — AB
HEMOGLOBIN S - HGBRFX: 50.4 % — AB
Hemoglobin A - HGBRFX: 0 % — ABNORMAL LOW (ref >96.0)
Hemoglobin A2 - HGBRFX: 4 % — ABNORMAL HIGH (ref 1.8–3.5)
Hemoglobin F - HGBRFX: 0.6 % (ref <2.0)
Sickle Solubility Test - HGBRFX: POSITIVE — AB

## 2013-08-17 NOTE — Progress Notes (Signed)
   Subjective:    Patient ID: Fonnie JarvisGeovanni D Bradfield, male    DOB: Dec 02, 1990, 23 y.o.   MRN: [id number]  HPI Patient is following up from previous hospital visit. Patient states that pain crisis has improved. Maintains that he has not required opiate medications since hospital discharge.  Current pain intensity is 4/10 describe as intermittent, aching, and tense. Patient states that pain is not consistent with a sickle cell crisis.  Also, patient reports that he has been feeling increased anxiety. Finds that it is difficult to manage a chronic disease, family, and job responsibilities. Maintains that his situation is becoming overwhelming. Reports that he cannot find methods to decrease stress.      Review of Systems  Constitutional: Positive for fatigue.  HENT: Negative.   Eyes: Negative.   Respiratory: Negative.   Cardiovascular: Negative.   Gastrointestinal: Negative.   Endocrine: Negative.   Genitourinary: Negative.   Musculoskeletal: Positive for back pain.  Skin: Negative.   Allergic/Immunologic: Negative.   Neurological: Negative.   Hematological: Negative.   Psychiatric/Behavioral: The patient is nervous/anxious.        Objective:   Physical Exam  Nursing note and vitals reviewed. Constitutional: He is oriented to person, place, and time. He appears well-developed and well-nourished.  HENT:  Head: Normocephalic and atraumatic.  Eyes: Conjunctivae and EOM are normal. Pupils are equal, round, and reactive to light.  Neck: Normal range of motion. Neck supple.  Cardiovascular: Normal rate and normal heart sounds.   Pulmonary/Chest: Effort normal and breath sounds normal.  Abdominal: Soft. Bowel sounds are normal.  Musculoskeletal: Normal range of motion.  Neurological: He is alert and oriented to person, place, and time.  Skin: Skin is warm and dry.  Psychiatric: He has a normal mood and affect.           Assessment & Plan:  1. Sickle cell anemia: Continue folic acid  daily. Discussed the importance of hydration and a balance diet at length. Recommend that patient drink protein supplemental shakes between meals for added nutrition. Patient to continue Ibuprofen 600 mg TID as needed for mild to moderate pain.   2. Anxiety: Patient states that he has challenges de-stressing. Finds that he is easily overwhelmed with managing a chronic illness, his family,  and job responsibilitesPatient feels that he is stressed the majority of the time. Referral to [facility] for counseling and evaluation, with Dr. Langston MaskerMorris.   3. 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 Reports that he has been starting to hear an occasional humming sound to left ear. Referral to [facility].   RTC: 1 month

## 2013-08-19 LAB — CULTURE, BLOOD (ROUTINE X 2)
Culture: NO GROWTH
Culture: NO GROWTH

## 2013-08-22 ENCOUNTER — Telehealth: Payer: Self-pay

## 2013-08-23 ENCOUNTER — Telehealth: Payer: Self-pay

## 2013-08-23 ENCOUNTER — Encounter (HOSPITAL_COMMUNITY): Payer: Self-pay | Admitting: Family Medicine

## 2013-08-23 NOTE — Telephone Encounter (Signed)
08/23/2013 10:46 AM Phone (Outgoing) Alfredia ClientHood, Lenord D (Self) [phone] (H) Left Message- Pt was contacted for a 2nd time to alert him of his upcomming appointments.VM was left asking him to contact office 08/23/2013 11:23 AM Phone (Incoming) CasaHood, [name] (Self) [phone] (H) Pt returned call to office. Pt was given dates as well as times for his new [employer].Pt requested a back to work note to be faxed to job stating he can return back to work on 08/27/2013

## 2013-08-23 NOTE — Telephone Encounter (Signed)
08/22/2013 9:17 AM Phone (Outgoing) William Jennings, [name] (Self) [phone] (H) Left Message- Pt was contacted VM was left to contact office to speak to him about date's and times for referal appointments starting next week.

## 2013-08-27 ENCOUNTER — Telehealth: Payer: Self-pay | Admitting: Internal Medicine

## 2013-08-27 NOTE — Telephone Encounter (Signed)
Faxed return to work letter to [employer] [phone] (STD) at the request of patient.

## 2013-09-18 ENCOUNTER — Ambulatory Visit: Payer: BC Managed Care – PPO | Admitting: Family Medicine

## 2013-09-23 ENCOUNTER — Encounter (HOSPITAL_BASED_OUTPATIENT_CLINIC_OR_DEPARTMENT_OTHER): Payer: Self-pay | Admitting: Emergency Medicine

## 2013-09-23 ENCOUNTER — Emergency Department (HOSPITAL_BASED_OUTPATIENT_CLINIC_OR_DEPARTMENT_OTHER)
Admission: EM | Admit: 2013-09-23 | Discharge: 2013-09-23 | Disposition: A | Discharge status: Home / Self Care | Payer: BC Managed Care – PPO | Attending: Emergency Medicine | Admitting: Emergency Medicine

## 2013-09-23 DIAGNOSIS — Z791 Long term (current) use of non-steroidal anti-inflammatories (NSAID): Secondary | ICD-10-CM | POA: Insufficient documentation

## 2013-09-23 DIAGNOSIS — Z79899 Other long term (current) drug therapy: Secondary | ICD-10-CM | POA: Insufficient documentation

## 2013-09-23 DIAGNOSIS — D5700 Hb-SS disease with crisis, unspecified: Secondary | ICD-10-CM | POA: Insufficient documentation

## 2013-09-23 DIAGNOSIS — Z87891 Personal history of nicotine dependence: Secondary | ICD-10-CM | POA: Insufficient documentation

## 2013-09-23 LAB — BASIC METABOLIC PANEL
BUN: 5 mg/dL — AB (ref 6–23)
CALCIUM: 9.7 mg/dL (ref 8.4–10.5)
CO2: 24 meq/L (ref 19–32)
Chloride: 104 mEq/L (ref 96–112)
Creatinine, Ser: 0.8 mg/dL (ref 0.50–1.35)
GFR calc Af Amer: >90 mL/min (ref >90)
GLUCOSE: 119 mg/dL — AB (ref 70–99)
Potassium: 4.1 mEq/L (ref 3.7–5.3)
Sodium: 139 mEq/L (ref 137–147)

## 2013-09-23 LAB — RETICULOCYTES
RBC.: 4.67 MIL/uL (ref 4.22–5.81)
RETIC CT PCT: 3.9 % — AB (ref 0.4–3.1)
Retic Count, Absolute: 182.1 10*3/uL (ref 19.0–186.0)

## 2013-09-23 LAB — CBC
HCT: 35.3 % — ABNORMAL LOW (ref 39.0–52.0)
Hemoglobin: 13 g/dL (ref 13.0–17.0)
MCH: 27.9 pg (ref 26.0–34.0)
MCHC: 36.8 g/dL — AB (ref 30.0–36.0)
MCV: 75.8 fL — ABNORMAL LOW (ref 78.0–100.0)
PLATELETS: 152 10*3/uL (ref 150–400)
RBC: 4.66 MIL/uL (ref 4.22–5.81)
RDW: 16.3 % — ABNORMAL HIGH (ref 11.5–15.5)
WBC: 10.1 10*3/uL (ref 4.0–10.5)

## 2013-09-23 MED ORDER — MORPHINE SULFATE 4 MG/ML IJ SOLN
6.0000 mg | Freq: Once | INTRAMUSCULAR | Status: AC
  Administered 2013-09-23: 6 mg via INTRAVENOUS
  Filled 2013-09-23: qty 2

## 2013-09-23 MED ORDER — SODIUM CHLORIDE 0.9 % IV BOLUS (SEPSIS)
1000.0000 mL | Freq: Once | INTRAVENOUS | Status: AC
  Administered 2013-09-23: 1000 mL via INTRAVENOUS

## 2013-09-23 NOTE — Discharge Instructions (Signed)
Sickle Cell Anemia, Adult °Sickle cell anemia is a condition in which red blood cells have an abnormal "sickle" shape. This abnormal shape shortens the cells' life span, which results in a lower than normal concentration of red blood cells in the blood. The sickle shape also causes the cells to clump together and block free blood flow through the blood vessels. As a result, the tissues and organs of the body do not receive enough oxygen. Sickle cell anemia causes organ damage and pain and increases the risk of infection. °CAUSES  °Sickle cell anemia is a genetic disorder. Those who receive two copies of the gene have the condition, and those who receive one copy have the trait. °RISK FACTORS °The sickle cell gene is most common in people whose families originated in [place]. Other areas of the globe where sickle cell trait occurs include the Mediterranean, [place] and [place], the [place], and the [place].  °SIGNS AND SYMPTOMS °· Pain, especially in the extremities, back, chest, or abdomen (common). The pain may start suddenly or may develop following an illness, especially if there is dehydration. Pain can also occur due to overexertion or exposure to extreme temperature changes. °· Frequent severe bacterial infections, especially certain types of pneumonia and meningitis. °· Pain and swelling in the hands and feet. °· Decreased activity.   °· Loss of appetite.   °· Change in behavior. °· Headaches. °· Seizures. °· Shortness of breath or difficulty breathing. °· Vision changes. °· Skin ulcers. °Those with the trait may not have symptoms or they may have mild symptoms.  °DIAGNOSIS  °Sickle cell anemia is diagnosed with blood tests that demonstrate the genetic trait. It is often diagnosed during the newborn period, due to mandatory testing nationwide. A variety of blood tests, X-rays, CT scans, MRI scans, ultrasounds, and lung function tests may also be done to monitor the condition. °TREATMENT  °Sickle  cell anemia may be treated with: °· Medicines. You may be given pain medicines, antibiotic medicines (to treat and prevent infections) or medicines to increase the production of certain types of hemoglobin. °· Fluids. °· Oxygen. °· Blood transfusions. °HOME CARE INSTRUCTIONS  °· Drink enough fluid to keep your urine clear or pale yellow. Increase your fluid intake in hot weather and during exercise. °· Do not smoke. Smoking lowers oxygen levels in the blood.   °· Only take over-the-counter or prescription medicines for pain, fever, or discomfort as directed by your health care provider. °· Take antibiotics as directed by your health care provider. Make sure you finish them it even if you start to feel better.   °· Take supplements as directed by your health care provider.   °· Consider wearing a medical alert bracelet. This tells anyone caring for you in an emergency of your condition.   °· When traveling, keep your medical information, health care provider's names, and the medicines you take with you at all times.   °· If you develop a fever, do not take medicines to reduce the fever right away. This could cover up a problem that is developing. Notify your health care provider. °· Keep all follow-up appointments with your health care provider. Sickle cell anemia requires regular medical care. °SEEK MEDICAL CARE IF: ° You have a fever. °SEEK IMMEDIATE MEDICAL CARE IF:  °· You feel dizzy or faint.   °· You have new abdominal pain, especially on the left side near the stomach area.   °· You develop a persistent, often uncomfortable and painful penile erection (priapism). If this is not treated immediately it   will lead to impotence.   °· You have numbness your arms or legs or you have a hard time moving them.   °· You have a hard time with speech.   °· You have a fever or persistent symptoms for more than 2 3 days.   °· You have a fever and your symptoms suddenly get worse.   °· You have signs or symptoms of infection.  These include:   °· Chills.   °· Abnormal tiredness (lethargy).   °· Irritability.   °· Poor eating.   °· Vomiting.   °· You develop pain that is not helped with medicine.   °· You develop shortness of breath. °· You have pain in your chest.   °· You are coughing up pus-like or bloody sputum.   °· You develop a stiff neck. °· Your feet or hands swell or have pain. °· Your abdomen appears bloated. °· You develop joint pain. °MAKE SURE YOU: °· Understand these instructions. °· Will watch your child's condition. °· Will get help right away if your child is not doing well or gets worse. °Document Released: 09/08/2005 Document Revised: 03/21/2013 Document Reviewed: 01/10/2013 °Exit[employer]® Patient Information ©2014 [employer]. ° °

## 2013-09-23 NOTE — ED Notes (Addendum)
Patient here with complaint of bilateral leg pain increasing in intensity for the past week, states related to his sickle cell, has been taking tylenol without relief.  Denies any recent illness. Has no pain meds at home

## 2013-09-23 NOTE — ED Provider Notes (Signed)
CSN: [id number]     Arrival date & time 09/23/13  0807 History   First MD Initiated Contact with Patient 09/23/13 [phone]     Chief Complaint  Patient presents with  . Sickle Cell Pain Crisis     (Consider location/radiation/quality/duration/timing/severity/associated sxs/prior Treatment) Patient is a 23 y.o. male presenting with sickle cell pain. The history is provided by the patient.  Sickle Cell Pain Crisis Location:  Lower extremity Severity:  Moderate Onset quality:  Gradual Duration:  1 week Similar to previous crisis episodes: yes   Timing:  Constant Progression:  Worsening Chronicity:  Chronic Context: alcohol consumption and dehydration   Relieved by:  Nothing Ineffective treatments:  OTC medications Associated symptoms: no chest pain, no cough, no fever, no leg ulcers, no nausea, no shortness of breath and no vomiting     Past Medical History  Diagnosis Date  . Sickle cell disease    Past Surgical History  Procedure Laterality Date  . Cholecystectomy     No family history on file. History  Substance Use Topics  . Smoking status: Former Games developer  . Smokeless tobacco: Never Used  . Alcohol Use: Yes     Comment: occasionally    Review of Systems  Constitutional: Negative for fever.  Respiratory: Negative for cough and shortness of breath.   Cardiovascular: Negative for chest pain.  Gastrointestinal: Negative for nausea, vomiting and abdominal pain.  All other systems reviewed and are negative.     Allergies  Shellfish allergy  Home Medications   Current Outpatient Rx  Name  Route  Sig  Dispense  Refill  . folic acid (FOLVITE) 800 MCG tablet   Oral   Take 800 mcg by mouth daily.         Marland Kitchen ibuprofen (ADVIL,MOTRIN) 600 MG tablet   Oral   Take 1 tablet (600 mg total) by mouth every 6 (six) hours as needed for mild pain or moderate pain.   30 tablet   1    BP 142/87  Pulse 59  Temp(Src) 98.1 F (36.7 C) (Oral)  Resp 18  SpO2 100% Physical  Exam  Nursing note and vitals reviewed. Constitutional: He is oriented to person, place, and time. He appears well-developed and well-nourished. No distress.  HENT:  Head: Normocephalic and atraumatic.  Mouth/Throat: No oropharyngeal exudate.  Eyes: EOM are normal. Pupils are equal, round, and reactive to light.  Neck: Normal range of motion. Neck supple.  Cardiovascular: Normal rate and regular rhythm.  Exam reveals no friction rub.   No murmur heard. Pulmonary/Chest: Effort normal and breath sounds normal. No respiratory distress. He has no wheezes. He has no rales.  Abdominal: Soft. He exhibits no distension. There is no tenderness. There is no rebound.  Musculoskeletal: Normal range of motion. He exhibits no edema.  Neurological: He is alert and oriented to person, place, and time.  Skin: No rash noted. He is not diaphoretic.    ED Course  Procedures (including critical care time) Labs Review Labs Reviewed  CBC  RETICULOCYTES  BASIC METABOLIC PANEL   Imaging Review No results found.   EKG Interpretation None      MDM   Final diagnoses:  Sickle cell pain crisis    48M presents with sickle cell crisis. Controlling at home with motrin, tylenol. Feels likely due to dehydration, alcohol. No fevers, CP, SOB. Similar to prior crises with L leg and R leg pain. Here vitals stable. Lungs clear, belly benign. L leg in pain, no joint  swelling, normal sensation, normal pulses. R leg with normal pulses, sensation. Will treat with morphine, fluids. Labs ok, no anemia. Feeling better after several doses of narcotics, stable for discharge. Patient has medication contract notification in the computer, will hold off on narcotics.    Dagmar HaitWilliam Antavion Bartoszek, MD 09/23/13 [phone]

## 2013-09-28 ENCOUNTER — Encounter (HOSPITAL_BASED_OUTPATIENT_CLINIC_OR_DEPARTMENT_OTHER): Payer: Self-pay | Admitting: Emergency Medicine

## 2013-09-28 ENCOUNTER — Emergency Department (HOSPITAL_BASED_OUTPATIENT_CLINIC_OR_DEPARTMENT_OTHER)
Admission: EM | Admit: 2013-09-28 | Discharge: 2013-09-29 | Disposition: A | Discharge status: Home / Self Care | Payer: BC Managed Care – PPO | Attending: Emergency Medicine | Admitting: Emergency Medicine

## 2013-09-28 ENCOUNTER — Emergency Department (HOSPITAL_BASED_OUTPATIENT_CLINIC_OR_DEPARTMENT_OTHER): Payer: BC Managed Care – PPO

## 2013-09-28 DIAGNOSIS — Z79899 Other long term (current) drug therapy: Secondary | ICD-10-CM | POA: Insufficient documentation

## 2013-09-28 DIAGNOSIS — Z87891 Personal history of nicotine dependence: Secondary | ICD-10-CM | POA: Insufficient documentation

## 2013-09-28 DIAGNOSIS — D5700 Hb-SS disease with crisis, unspecified: Secondary | ICD-10-CM | POA: Insufficient documentation

## 2013-09-28 MED ORDER — HYDROMORPHONE HCL PF 1 MG/ML IJ SOLN
1.0000 mg | Freq: Once | INTRAMUSCULAR | Status: AC
  Administered 2013-09-28: 1 mg via INTRAVENOUS
  Filled 2013-09-28: qty 1

## 2013-09-28 MED ORDER — SODIUM CHLORIDE 0.9 % IV BOLUS (SEPSIS)
1000.0000 mL | Freq: Once | INTRAVENOUS | Status: AC
  Administered 2013-09-28: 1000 mL via INTRAVENOUS

## 2013-09-28 MED ORDER — KETOROLAC TROMETHAMINE 30 MG/ML IJ SOLN
30.0000 mg | Freq: Once | INTRAMUSCULAR | Status: AC
  Administered 2013-09-28: 30 mg via INTRAVENOUS
  Filled 2013-09-28: qty 1

## 2013-09-28 NOTE — ED Notes (Signed)
C/o left leg pain-feels it is SSC

## 2013-09-28 NOTE — ED Provider Notes (Signed)
CSN: [id number]     Arrival date & time 09/28/13  2147 History  This chart was scribed for Leah Skora Smitty CordsK Saraia Platner-Rasch, MD by Dorothey Basemania Sutton, ED Scribe. This patient was seen in room MH01/MH01 and the patient's care was started at 11:27 PM.    Chief Complaint  Patient presents with  . Leg Pain   Patient is a 23 y.o. male presenting with leg pain. The history is provided by the patient. No language interpreter was used.  Leg Pain Lower extremity pain location: left anterior proximal shin. Injury: no   Pain details:    Quality:  Sharp   Radiates to:  Does not radiate   Severity:  Moderate   Onset quality:  Gradual   Timing:  Intermittent   Progression:  Unchanged Chronicity:  Recurrent Dislocation: no   Relieved by:  NSAIDs (ibuprofen) Worsened by:  Nothing tried Ineffective treatments:  NSAIDs Associated symptoms: no fever, no swelling and no tingling   Risk factors: no concern for non-accidental trauma    HPI Comments: William Jennings is a 23 y.o. Male with a history of sickle cell disease who presents to the Emergency Department complaining of an intermittent pain, 7/10 currently, to the left, proximal shin that he states feels similar to his prior sickle cell crises. He reports taking ibuprofen at home with mild, temporary relief, but states that he does not have any other pain medications at home to treat his sickle cell pain. Patient was seen here 5 days ago for similar complaints and was treated in the ED. He denies cough, fever, vomiting. No CP no SOB not DOE.  He denies any allergies to medications. Patient reports that he does not remember the last time he had the leg x-rayed. Patient has no other pertinent medical history.   Past Medical History  Diagnosis Date  . Sickle cell disease    Past Surgical History  Procedure Laterality Date  . Cholecystectomy     No family history on file. History  Substance Use Topics  . Smoking status: Former Games developermoker  . Smokeless tobacco: Never  Used  . Alcohol Use: Yes     Comment: occasionally    Review of Systems  Constitutional: Negative for fever.  Respiratory: Negative for cough and shortness of breath.   Cardiovascular: Negative for chest pain, palpitations and leg swelling.  Gastrointestinal: Negative for vomiting.  Musculoskeletal: Positive for arthralgias and myalgias.  All other systems reviewed and are negative.     Allergies  Shellfish allergy  Home Medications   Prior to Admission medications   Medication Sig Start Date End Date Taking? Authorizing Provider  folic acid (FOLVITE) 800 MCG tablet Take 800 mcg by mouth daily.    Historical Provider, MD  ibuprofen (ADVIL,MOTRIN) 600 MG tablet Take 1 tablet (600 mg total) by mouth every 6 (six) hours as needed for mild pain or moderate pain. 08/08/13   Massie MaroonLachina M Hollis, FNP   Triage Vitals: BP 123/77  Pulse 118  Temp(Src) 98.4 F (36.9 C) (Oral)  Resp 16  Ht 5\' 8"  (1.727 m)  Wt 140 lb (63.504 kg)  BMI 21.29 kg/m2  SpO2 99%  Physical Exam  Nursing note and vitals reviewed. Constitutional: He is oriented to person, place, and time. He appears well-developed and well-nourished. No distress.  HENT:  Head: Normocephalic and atraumatic.  Mouth/Throat: Oropharynx is clear and moist. No oropharyngeal exudate.  Eyes: Conjunctivae and EOM are normal. Pupils are equal, round, and reactive to light.  Neck: Normal  range of motion. Neck supple.  Cardiovascular: Normal rate, regular rhythm, normal heart sounds and intact distal pulses.   Pulses:      Dorsalis pedis pulses are 3+ on the right side, and 3+ on the left side.  Pulmonary/Chest: Effort normal and breath sounds normal. No respiratory distress. He has no wheezes. He has no rales.  Abdominal: Soft. Bowel sounds are normal. He exhibits no distension. There is no tenderness. There is no rebound.  Musculoskeletal: Normal range of motion. He exhibits no edema and no tenderness.       Left lower leg: He exhibits  no tenderness, no bony tenderness, no swelling, no edema and no deformity.       Legs: Neurological: He is alert and oriented to person, place, and time. He has normal reflexes.  Neurovascularly intact lower extremities without swelling or cords, all compartments are soft.   Skin: Skin is warm and dry.  Brisk capillary refill.   Psychiatric: He has a normal mood and affect. His behavior is normal.    ED Course  Procedures (including critical care time)  DIAGNOSTIC STUDIES: Oxygen Saturation is 99% on room air, normal by my interpretation.    COORDINATION OF CARE: 11:29 PM- Discussed treatment plan with patient at bedside and patient verbalized agreement.     Labs Review Labs Reviewed - No data to display  Imaging Review No results found.   EKG Interpretation None      MDM   Final diagnoses:  None    No symptoms of acute chest.  Not actively reticing.  Pain markedly improved.  Safe for discharge.  Follow up with the sickle cell clinic  I personally performed the services described in this documentation, which was scribed in my presence. The recorded information has been reviewed and is accurate.    Jasmine AweApril K Eann Cleland-Rasch, MD 09/29/13 [phone]

## 2013-09-29 ENCOUNTER — Encounter (HOSPITAL_BASED_OUTPATIENT_CLINIC_OR_DEPARTMENT_OTHER): Payer: Self-pay | Admitting: Emergency Medicine

## 2013-09-29 LAB — BASIC METABOLIC PANEL
BUN: 15 mg/dL (ref 6–23)
CALCIUM: 9.7 mg/dL (ref 8.4–10.5)
CHLORIDE: 99 meq/L (ref 96–112)
CO2: 25 mEq/L (ref 19–32)
CREATININE: 0.8 mg/dL (ref 0.50–1.35)
GFR calc Af Amer: >90 mL/min (ref >90)
GFR calc non Af Amer: >90 mL/min (ref >90)
Glucose, Bld: 132 mg/dL — ABNORMAL HIGH (ref 70–99)
Potassium: 4.3 mEq/L (ref 3.7–5.3)
Sodium: 138 mEq/L (ref 137–147)

## 2013-09-29 LAB — CBC WITH DIFFERENTIAL/PLATELET
Basophils Absolute: 0.1 10*3/uL (ref 0.0–0.1)
Basophils Relative: 1 % (ref 0–1)
EOS ABS: 0.2 10*3/uL (ref 0.0–0.7)
EOS PCT: 2 % (ref 0–5)
HCT: 31.3 % — ABNORMAL LOW (ref 39.0–52.0)
Hemoglobin: 11.6 g/dL — ABNORMAL LOW (ref 13.0–17.0)
LYMPHS ABS: 2.8 10*3/uL (ref 0.7–4.0)
Lymphocytes Relative: 29 % (ref 12–46)
MCH: 27.6 pg (ref 26.0–34.0)
MCHC: 37.1 g/dL — AB (ref 30.0–36.0)
MCV: 74.3 fL — AB (ref 78.0–100.0)
MONO ABS: 1.6 10*3/uL — AB (ref 0.1–1.0)
Monocytes Relative: 17 % — ABNORMAL HIGH (ref 3–12)
NEUTROS PCT: 51 % (ref 43–77)
Neutro Abs: 5 10*3/uL (ref 1.7–7.7)
Platelets: 138 10*3/uL — ABNORMAL LOW (ref 150–400)
RBC: 4.21 MIL/uL — ABNORMAL LOW (ref 4.22–5.81)
RDW: 16.1 % — ABNORMAL HIGH (ref 11.5–15.5)
WBC: 9.7 10*3/uL (ref 4.0–10.5)

## 2013-09-29 LAB — RETICULOCYTES
RBC.: 4.17 MIL/uL — ABNORMAL LOW (ref 4.22–5.81)
RETIC CT PCT: 4 % — AB (ref 0.4–3.1)
Retic Count, Absolute: 166.8 10*3/uL (ref 19.0–186.0)

## 2013-09-29 MED ORDER — HYDROMORPHONE HCL PF 1 MG/ML IJ SOLN
1.0000 mg | Freq: Once | INTRAMUSCULAR | Status: AC
  Administered 2013-09-29: 1 mg via INTRAVENOUS
  Filled 2013-09-29: qty 1

## 2013-09-29 NOTE — Discharge Instructions (Signed)
Sickle Cell Anemia, Adult °Sickle cell anemia is a condition where your red blood cells are shaped like sickles. Red blood cells carry oxygen through the body. Sickle-shaped red blood cells cells do not live as long as normal red blood cells. They also clump together and block blood from flowing through the blood vessels. These things prevent the body from getting enough oxygen. Sickle cell anemia causes organ damage and pain. It also increases the risk of infection. °HOME CARE °· Drink enough fluid to keep your pee (urine) clear or pale yellow. Drink more in hot weather and during exercise. °· Do not smoke. Smoking lowers oxygen levels in the blood. °· Only take over-the-counter or prescription medicines as told by your doctor. °· Take antibiotic medicines as told by your doctor. Make sure you finish them it even if you start to feel better. °· Take supplements as told by your doctor. °· Consider wearing a medical alert bracelet. This tells anyone caring for you in an emergency of your condition. °· When traveling, keep your medical information, doctors' names, and the medicines you take with you at all times. °· If you have a fever, do not take fever medicines right away. This could cover up a problem. Tell your doctor. °·  Keep all follow-up appointments with your doctor. Sickle cell anemia requires regular medical care. °GET HELP IF: °You have a fever. °GET HELP RIGHT AWAY IF: °· You feel dizzy or faint. °· You have new belly (abdominal) pain, especially on the left side near the stomach area. °· You have a lasting, often uncomfortable and painful erection of the penis (priapism). If it is not treated right away, you will become unable to have sex (impotence). °· You have numbness your arms or legs or you have a hard time moving them. °· You have a hard time talking. °· You have a fever or lasting symptoms for more than 2 3 days. °· You have a fever and your symptoms suddenly get worse. °· You have signs or  symptoms of infection. These include: °· Chills. °· Being more tired than normal (lethargy). °· Irritability. °· Poor eating. °· Throwing up (vomiting). °· You have pain that is not helped with medicine. °· You have shortness of breath. °· You have pain in your chest. °· You are coughing up pus-like or bloody mucus. °· You have a stiff neck. °· Your feet or hands swell or have pain. °· Your belly looks bloated. °· Your joints hurt. °MAKE SURE YOU: °· Understand these instructions. °· Will watch your condition. °· Will get help right away if you are not doing well or get worse. °Document Released: 03/21/2013 Document Reviewed: 01/10/2013 °[employer]® Patient Information ©2014 [employer]. ° °

## 2013-09-29 NOTE — ED Notes (Signed)
Here for 2-week history of sickle cell crisis pain on left leg, progressively getting worst. Only home pain medication is ibuprofen, and is not helping. Denies sob, cp, lungs clear.  Alert oriented, NAD

## 2013-10-01 ENCOUNTER — Telehealth: Payer: Self-pay | Admitting: Internal Medicine

## 2013-10-01 ENCOUNTER — Other Ambulatory Visit: Payer: Self-pay

## 2013-10-01 DIAGNOSIS — D571 Sickle-cell disease without crisis: Secondary | ICD-10-CM

## 2013-10-01 MED ORDER — HYDROCODONE-ACETAMINOPHEN 5-325 MG PO TABS: 1.0000 | ORAL_TABLET | Freq: Four times a day (QID) | ORAL | Status: DC | PRN

## 2013-10-01 MED ORDER — IBUPROFEN 600 MG PO TABS
600.0000 mg | ORAL_TABLET | Freq: Four times a day (QID) | ORAL | Status: DC | PRN
Start: 2013-10-01 — End: 2013-10-31

## 2013-10-01 NOTE — Telephone Encounter (Signed)
Hydrocodone 5-325 mg every 6 hours as needed for severe pain #30 tablets

## 2013-10-01 NOTE — Telephone Encounter (Signed)
Medication refill request for VICODIN 5-325  Last office visit 08/17/2013

## 2013-10-31 ENCOUNTER — Telehealth (HOSPITAL_COMMUNITY): Payer: Self-pay | Admitting: Hematology

## 2013-10-31 ENCOUNTER — Non-Acute Institutional Stay (HOSPITAL_COMMUNITY)
Admission: AD | Admit: 2013-10-31 | Discharge: 2013-10-31 | Disposition: A | Discharge status: Home / Self Care | Payer: BC Managed Care – PPO | Attending: Internal Medicine | Admitting: Internal Medicine

## 2013-10-31 ENCOUNTER — Encounter (HOSPITAL_COMMUNITY): Payer: Self-pay | Admitting: Hematology

## 2013-10-31 DIAGNOSIS — D5700 Hb-SS disease with crisis, unspecified: Secondary | ICD-10-CM | POA: Insufficient documentation

## 2013-10-31 DIAGNOSIS — D571 Sickle-cell disease without crisis: Secondary | ICD-10-CM

## 2013-10-31 LAB — CBC WITH DIFFERENTIAL/PLATELET
BASOS PCT: 0 % (ref 0–1)
Basophils Absolute: 0 10*3/uL (ref 0.0–0.1)
Eosinophils Absolute: 0 10*3/uL (ref 0.0–0.7)
Eosinophils Relative: 0 % (ref 0–5)
HEMATOCRIT: 33.3 % — AB (ref 39.0–52.0)
Hemoglobin: 11.9 g/dL — ABNORMAL LOW (ref 13.0–17.0)
Lymphocytes Relative: 26 % (ref 12–46)
Lymphs Abs: 2 10*3/uL (ref 0.7–4.0)
MCH: 26.6 pg (ref 26.0–34.0)
MCHC: 35.7 g/dL (ref 30.0–36.0)
MCV: 74.5 fL — AB (ref 78.0–100.0)
MONO ABS: 0.8 10*3/uL (ref 0.1–1.0)
Monocytes Relative: 10 % (ref 3–12)
NEUTROS ABS: 5 10*3/uL (ref 1.7–7.7)
Neutrophils Relative %: 64 % (ref 43–77)
Platelets: 132 10*3/uL — ABNORMAL LOW (ref 150–400)
RBC: 4.47 MIL/uL (ref 4.22–5.81)
RDW: 17.6 % — ABNORMAL HIGH (ref 11.5–15.5)
WBC: 7.8 10*3/uL (ref 4.0–10.5)

## 2013-10-31 LAB — RETICULOCYTES
RBC.: 4.47 MIL/uL (ref 4.22–5.81)
RETIC COUNT ABSOLUTE: 183.3 10*3/uL (ref 19.0–186.0)
Retic Ct Pct: 4.1 % — ABNORMAL HIGH (ref 0.4–3.1)

## 2013-10-31 LAB — COMPREHENSIVE METABOLIC PANEL
ALBUMIN: 4.4 g/dL (ref 3.5–5.2)
ALT: 13 U/L (ref 0–53)
AST: 26 U/L (ref 0–37)
Alkaline Phosphatase: 135 U/L — ABNORMAL HIGH (ref 39–117)
BILIRUBIN TOTAL: 1.2 mg/dL (ref 0.3–1.2)
BUN: 6 mg/dL (ref 6–23)
CHLORIDE: 102 meq/L (ref 96–112)
CO2: 25 mEq/L (ref 19–32)
CREATININE: 0.78 mg/dL (ref 0.50–1.35)
Calcium: 9.7 mg/dL (ref 8.4–10.5)
GFR calc Af Amer: >90 mL/min (ref >90)
GFR calc non Af Amer: >90 mL/min (ref >90)
Glucose, Bld: 86 mg/dL (ref 70–99)
Potassium: 4 mEq/L (ref 3.7–5.3)
Sodium: 140 mEq/L (ref 137–147)
TOTAL PROTEIN: 8.2 g/dL (ref 6.0–8.3)

## 2013-10-31 MED ORDER — KETOROLAC TROMETHAMINE 30 MG/ML IJ SOLN
30.0000 mg | Freq: Once | INTRAMUSCULAR | Status: AC
  Administered 2013-10-31: 30 mg via INTRAMUSCULAR
  Filled 2013-10-31: qty 1

## 2013-10-31 MED ORDER — IBUPROFEN 800 MG PO TABS: 800.0000 mg | ORAL_TABLET | Freq: Three times a day (TID) | ORAL | Status: DC | PRN

## 2013-10-31 MED ORDER — HYDROCODONE-ACETAMINOPHEN 5-325 MG PO TABS
1.0000 | ORAL_TABLET | Freq: Once | ORAL | Status: AC
  Administered 2013-10-31: 1 via ORAL
  Filled 2013-10-31: qty 1

## 2013-10-31 MED ORDER — DEXTROSE-NACL 5-0.45 % IV SOLN
INTRAVENOUS | Status: DC
  Administered 2013-10-31: 09:00:00 via INTRAVENOUS

## 2013-10-31 MED ORDER — HYDROMORPHONE HCL PF 2 MG/ML IJ SOLN
1.5000 mg | Freq: Once | INTRAMUSCULAR | Status: AC
  Administered 2013-10-31: 1.5 mg via INTRAVENOUS
  Filled 2013-10-31: qty 1

## 2013-10-31 MED ORDER — HYDROMORPHONE HCL PF 2 MG/ML IJ SOLN
2.1000 mg | Freq: Once | INTRAMUSCULAR | Status: AC
  Administered 2013-10-31: 2.1 mg via INTRAVENOUS
  Filled 2013-10-31: qty 2

## 2013-10-31 MED ORDER — HYDROMORPHONE HCL PF 2 MG/ML IJ SOLN
1.8000 mg | Freq: Once | INTRAMUSCULAR | Status: AC
  Administered 2013-10-31: 1.8 mg via INTRAVENOUS
  Filled 2013-10-31: qty 1

## 2013-10-31 NOTE — Progress Notes (Signed)
Patient discharged home. Patient states he is very drowsy and requests wheelchair to vehicle.Patient in no apparent distress. Discharge instructions reviewed. Patient acknowledges. Prescription and work note given. Patient made appointment for 2 week follow up visit. Patient left day hospital with belongings in wheelchair escorted by family member.

## 2013-10-31 NOTE — Telephone Encounter (Signed)
Spoke with physician and it is ok for patient to come to day hospital.  Patient verbalizing understanding

## 2013-10-31 NOTE — Discharge Summary (Signed)
[facility] Discharge Summary   Patient ID: William Jennings MRN: [id number] DOB/AGE: 07-Apr-1991 23 y.o.  Admit date: 10/31/2013 Discharge date: 10/31/2013  Primary Care Physician:  MATTHEWS,MICHELLE A., MD  Admission Diagnoses:  Active Problems:   Sickle cell crisis   Discharge Diagnoses:   Sickle cell anemia  Discharge Medications:    Medication List    ASK your doctor about these medications       folic acid 800 MCG tablet  Commonly known as:  FOLVITE  Take 800 mcg by mouth daily.     HYDROcodone-acetaminophen 5-325 MG per tablet  Commonly known as:  NORCO  Take 1 tablet by mouth every 6 (six) hours as needed for moderate pain.     ibuprofen 600 MG tablet  Commonly known as:  ADVIL,MOTRIN  Take 1 tablet (600 mg total) by mouth every 6 (six) hours as needed for mild pain or moderate pain.         Consults:  None  Significant Diagnostic Studies:  No results found.   [facility] Course: Patient admitted to [facility] cell day hospital for extended observation. Patient was started on hypotonic IVFs. Patient appears to be hydrated and is tolerating oral fluids. Patient was given IV Toradol 30 mg and IV Dilaudid per weight based rapid re-dosing pain intensity decreased to 4/10. Patient was given Percocet 5-325 mg, intensity decreased to 2/10 prior to discharge. Patient is functional and maintains that he can manage at home on Percocet 5-325 and Ibuprofen. He was given prescriptions for Ibuprofen and Percocet. Will follow up in office as scheduled. Patient discharged home in stable condition with his sister.    Physical Exam at Discharge:  BP 126/62  Pulse 85  Temp(Src) 98.2 F (36.8 C) (Oral)  Resp 20  Ht 5\' 8"  (1.727 m)  Wt 140 lb (63.504 kg)  BMI 21.29 kg/m2  SpO2 100% General appearance: alert, cooperative, appears stated age, no distress  Head: Normocephalic, without obvious abnormality, atraumatic  Eyes: negative,  conjunctivae/corneas clear. PERRL, EOM's intact. Fundi benign.  Back: symmetric, no curvature. ROM normal. No CVA tenderness.,  Lungs: clear to auscultation bilaterally  Abdomen: soft, non-tender; bowel sounds normal; no masses Neurologic: Alert and oriented X 3, normal strength and tone. Normal symmetric reflexes. Normal coordination and gait   Disposition at Discharge: 01-Home or Self Care  Discharge Orders:   Condition at Discharge:   Stable  Time spent on Discharge:  Greater than 30 minutes.  Signed: Massie MaroonLachina M Blaine Hari 10/31/2013, 3:15 PM

## 2013-10-31 NOTE — H&P (Signed)
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 Pt  Takes medications on an as needed basis only and has not required medications for almost 1 month. However, he had an acute crisis starting 3 days ago. He was at training for a new job and was not in a position to take any time off. He tried to manage with anti-inflammatory medications and fluids however he was unable to effectively manage pain and he is here today for management of pain associated with acute vaso-occlusive episode.   His pain is localized to back and legs and is rated as 8/10. It is throbbing and sharp in nature. It is non-radiating and not associated with any other symptoms.    Review of Systems:  Constitutional: No weight loss, night sweats, Fevers, chills, fatigue.  HEENT: No headaches, dizziness, seizures, vision changes, difficulty swallowing,Tooth/dental problems,Sore throat, No sneezing, itching, ear ache, nasal congestion, post nasal drip,  Cardio-vascular: No chest pain, Orthopnea, PND, swelling in lower extremities, anasarca, dizziness, palpitations  GI: No heartburn, indigestion, abdominal pain, nausea, vomiting, diarrhea, change in bowel habits, loss of appetite  Resp: No shortness of breath with exertion or at rest. No excess mucus, no productive cough, No non-productive cough, No coughing up of blood.No change in color of mucus.No wheezing.No chest wall deformity  Skin: no rash or lesions.  GU: no dysuria, change in color of urine, no urgency or frequency. No flank pain.   Psych: No change in mood or affect. No depression or anxiety. No memory loss.    Past Medical History  Diagnosis Date  . Sickle cell disease    Past Surgical History  Procedure Laterality Date  .  Cholecystectomy     Social History:  reports that he has quit smoking. He has never used smokeless tobacco. He reports that he drinks alcohol. He reports that he does not use illicit drugs.  Allergies  Allergen Reactions  . Shellfish Allergy Anaphylaxis    No family history on file.  Prior to Admission medications   Medication Sig Start Date End Date Taking? Authorizing Provider  folic acid (FOLVITE) 800 MCG tablet Take 800 mcg by mouth daily.   Yes Historical Provider, MD  HYDROcodone-acetaminophen (NORCO) 5-325 MG per tablet Take 1 tablet by mouth every 6 (six) hours as needed for moderate pain. 10/01/13  Yes Massie MaroonLachina M Hollis, FNP  ibuprofen (ADVIL,MOTRIN) 600 MG tablet Take 1 tablet (600 mg total) by mouth every 6 (six) hours as needed for mild pain or moderate pain. 10/01/13  Yes Altha HarmMichelle A Matthews, MD   Physical Exam: Filed Vitals:   10/31/13 0903  BP: 122/69  Pulse: 64  Temp: 98.1 F (36.7 C)  TempSrc: Oral  Resp: 20  Height: 5\' 8"  (1.727 m)  Weight: 140 lb (63.504 kg)  SpO2: 100%   BP 126/62  Pulse 85  Temp(Src) 98.2 F (36.8 C) (Oral)  Resp 20  Ht 5\' 8"  (1.727 m)  Wt 140 lb (63.504 kg)  BMI 21.29 kg/m2  SpO2 100%  General Appearance:    Alert, cooperative, no mild distress, appears stated age  Head:    Normocephalic, without obvious abnormality, atraumatic  Eyes:    PERRL, conjunctiva/corneas clear, EOM's intact, fundi    benign, both  eyes anicteric      Throat:   Lips, mucosa, and tongue normal; teeth and gums normal  Neck:   Supple, symmetrical, trachea midline, no adenopathy;       thyroid:  No enlargement/tenderness/nodules; no carotid   bruit or JVD  Back:     Symmetric, no curvature, ROM normal, no CVA tenderness  Lungs:     Clear to auscultation bilaterally, respirations unlabored  Chest wall:    No tenderness or deformity  Heart:    Regular rate and rhythm, S1 and S2 normal, no murmur, rub   or gallop  Abdomen:     Soft, non-tender, bowel sounds  active all four quadrants,    no masses, no organomegaly  Extremities:   Extremities normal, atraumatic, no cyanosis or edema  Pulses:   2+ and symmetric all extremities  Skin:   Skin color, texture, turgor normal, no rashes or lesions  Lymph nodes:   Cervical, supraclavicular, and axillary nodes normal  Neurologic:   CNII-XII intact. Normal strength, sensation and reflexes      throughout    Labs on Admission:   Basic Metabolic Panel: No results found for this basename: NA, K, CL, CO2, GLUCOSE, BUN, CREATININE, CALCIUM, MG, PHOS,  in the last 168 hours Liver Function Tests: No results found for this basename: AST, ALT, ALKPHOS, BILITOT, PROT, ALBUMIN,  in the last 168 hours CBC: No results found for this basename: WBC, NEUTROABS, HGB, HCT, MCV, PLT,  in the last 168 hours BNP: No components found with this basename: POCBNP,    Radiological Exams on Admission: No results found.    Assessment/Plan: Active Problems:  Hb SS with acute vaso-occlusive episode: Pt will be treated with initially treated with weight based rapid re-dosing IVF and Toradol . If pain is above 7/10 then Pt will be transitioned to a weight based Dilaudid PCA . Patient will be re-evaluated for pain in the context of function and relationship to baseline as care progresses.    Time spend: 40 minutes Code Status: Full Code Family Communication: N/A Disposition Plan: Likely home at the end of therapy.  Ladona HornsMichelle A Matthews  Michelle A Matthews, MD  Pager [phone]  10/31/2013, 9:12 AM

## 2013-10-31 NOTE — Telephone Encounter (Signed)
Patient C/O pain to right leg.  Patient rates pain 8/10 on pain scale.  Patient states he has only taken Ibuprofen for pain as he is out of prescription pain medication.  Patient denies fever, chest pain, abdominal pain, N/V and priapism.  I explained I would page the physician and give him a call back.  Patient Verbalizes understanding.

## 2013-10-31 NOTE — Discharge Instructions (Signed)
Sickle Cell Anemia, Adult °Sickle cell anemia is a condition in which red blood cells have an abnormal "sickle" shape. This abnormal shape shortens the cells' life span, which results in a lower than normal concentration of red blood cells in the blood. The sickle shape also causes the cells to clump together and block free blood flow through the blood vessels. As a result, the tissues and organs of the body do not receive enough oxygen. Sickle cell anemia causes organ damage and pain and increases the risk of infection. °CAUSES  °Sickle cell anemia is a genetic disorder. Those who receive two copies of the gene have the condition, and those who receive one copy have the trait. °RISK FACTORS °The sickle cell gene is most common in people whose families originated in [place]. Other areas of the globe where sickle cell trait occurs include the Mediterranean, [place] and [place], the [place], and the [place].  °SIGNS AND SYMPTOMS °· Pain, especially in the extremities, back, chest, or abdomen (common). The pain may start suddenly or may develop following an illness, especially if there is dehydration. Pain can also occur due to overexertion or exposure to extreme temperature changes. °· Frequent severe bacterial infections, especially certain types of pneumonia and meningitis. °· Pain and swelling in the hands and feet. °· Decreased activity.   °· Loss of appetite.   °· Change in behavior. °· Headaches. °· Seizures. °· Shortness of breath or difficulty breathing. °· Vision changes. °· Skin ulcers. °Those with the trait may not have symptoms or they may have mild symptoms.  °DIAGNOSIS  °Sickle cell anemia is diagnosed with blood tests that demonstrate the genetic trait. It is often diagnosed during the newborn period, due to mandatory testing nationwide. A variety of blood tests, X-rays, CT scans, MRI scans, ultrasounds, and lung function tests may also be done to monitor the condition. °TREATMENT  °Sickle  cell anemia may be treated with: °· Medicines. You may be given pain medicines, antibiotic medicines (to treat and prevent infections) or medicines to increase the production of certain types of hemoglobin. °· Fluids. °· Oxygen. °· Blood transfusions. °HOME CARE INSTRUCTIONS  °· Drink enough fluid to keep your urine clear or pale yellow. Increase your fluid intake in hot weather and during exercise. °· Do not smoke. Smoking lowers oxygen levels in the blood.   °· Only take over-the-counter or prescription medicines for pain, fever, or discomfort as directed by your health care provider. °· Take antibiotics as directed by your health care provider. Make sure you finish them it even if you start to feel better.   °· Take supplements as directed by your health care provider.   °· Consider wearing a medical alert bracelet. This tells anyone caring for you in an emergency of your condition.   °· When traveling, keep your medical information, health care provider's names, and the medicines you take with you at all times.   °· If you develop a fever, do not take medicines to reduce the fever right away. This could cover up a problem that is developing. Notify your health care provider. °· Keep all follow-up appointments with your health care provider. Sickle cell anemia requires regular medical care. °SEEK MEDICAL CARE IF: ° You have a fever. °SEEK IMMEDIATE MEDICAL CARE IF:  °· You feel dizzy or faint.   °· You have new abdominal pain, especially on the left side near the stomach area.   °· You develop a persistent, often uncomfortable and painful penile erection (priapism). If this is not treated immediately it   will lead to impotence.   °· You have numbness your arms or legs or you have a hard time moving them.   °· You have a hard time with speech.   °· You have a fever or persistent symptoms for more than 2 3 days.   °· You have a fever and your symptoms suddenly get worse.   °· You have signs or symptoms of infection.  These include:   °· Chills.   °· Abnormal tiredness (lethargy).   °· Irritability.   °· Poor eating.   °· Vomiting.   °· You develop pain that is not helped with medicine.   °· You develop shortness of breath. °· You have pain in your chest.   °· You are coughing up pus-like or bloody sputum.   °· You develop a stiff neck. °· Your feet or hands swell or have pain. °· Your abdomen appears bloated. °· You develop joint pain. °MAKE SURE YOU: °· Understand these instructions. °· Will watch your child's condition. °· Will get help right away if your child is not doing well or gets worse. °Document Released: 09/08/2005 Document Revised: 03/21/2013 Document Reviewed: 01/10/2013 °Exit[employer]® Patient Information ©2014 [employer]. ° °

## 2013-11-15 NOTE — Discharge Summary (Signed)
Pt seen and examined and discussed with NP Julianne Handler. Agree with discharge homwe.  Altha Harm

## 2013-11-16 ENCOUNTER — Ambulatory Visit (INDEPENDENT_AMBULATORY_CARE_PROVIDER_SITE_OTHER): Payer: BC Managed Care – PPO | Admitting: Family Medicine

## 2013-11-16 ENCOUNTER — Encounter: Payer: Self-pay | Admitting: Family Medicine

## 2013-11-16 VITALS — BP 111/62 | HR 69 | Temp 97.8°F | Resp 20 | Ht 68.0 in | Wt 146.0 lb

## 2013-11-16 DIAGNOSIS — D57219 Sickle-cell/Hb-C disease with crisis, unspecified: Secondary | ICD-10-CM

## 2013-11-16 DIAGNOSIS — D571 Sickle-cell disease without crisis: Secondary | ICD-10-CM

## 2013-11-16 NOTE — Progress Notes (Signed)
   Subjective:    Patient ID: William Jennings, male    DOB: 09/29/1990, 23 y.o.   MRN: [id number]  HPI Patient presents for 3 month follow up of sickle cell disease. He reports that he has been feeling well with minimal pain. Current pain intensity is 3/10 to right upper thigh, described as intermittent and aching. States that pain was moderately relieved by Ibuprofen 800 mg this am.    Review of Systems  Constitutional: Negative.   HENT: Negative.   Eyes: Negative.   Respiratory: Negative.   Cardiovascular: Negative.   Gastrointestinal: Negative.   Endocrine: Negative.   Genitourinary: Negative.   Musculoskeletal: Positive for myalgias.  Skin: Negative.   Allergic/Immunologic: Negative.   Neurological: Negative.   Hematological: Negative.   Psychiatric/Behavioral: Negative.        Objective:   Physical Exam  Nursing note and vitals reviewed. Constitutional: He is oriented to person, place, and time. He appears well-developed and well-nourished.  HENT:  Head: Normocephalic and atraumatic.  Eyes: Conjunctivae and EOM are normal. Pupils are equal, round, and reactive to light.  Neck: Normal range of motion. Neck supple.  Cardiovascular: Normal rate and normal heart sounds.   Pulmonary/Chest: Effort normal and breath sounds normal.  Abdominal: Soft. Bowel sounds are normal.  Musculoskeletal: Normal range of motion.  Neurological: He is alert and oriented to person, place, and time.  Skin: Skin is warm and dry.  Psychiatric: He has a normal mood and affect.    BP 111/62  Pulse 69  Temp(Src) 97.8 F (36.6 C) (Oral)  Resp 20  Ht 5\' 8"  (1.727 m)  Wt 146 lb (66.225 kg)  BMI 22.20 kg/m2      Assessment & Plan:  1. Sickle cell disease - We discussed the need for good hydration, monitoring of hydration status, avoidance of heat, cold, stress, and infection triggers. Continue folic acid 1 mg daily to prevent aplastic bone marrow crises. Reviewed labs from 10/31/2013,  consistent with baseline.   2. Cardiac - Routine screening for pulmonary hypertension is not recommended.  3. Eye - High risk of proliferative retinopathy. Annual eye exam with retinal exam recommended to patient. Referral for Dr. Izell Tyrone  4. Immunization status - He declines vaccines today. Yearly influenza vaccination is recommended, as well as being up to date with Meningococcal and Pneumococcal vaccines.    Massie Maroon, FNP

## 2014-01-03 ENCOUNTER — Telehealth: Payer: Self-pay | Admitting: Internal Medicine

## 2014-01-03 MED ORDER — HYDROCODONE-ACETAMINOPHEN 5-325 MG PO TABS: 1.0000 | ORAL_TABLET | Freq: Four times a day (QID) | ORAL | Status: DC | PRN

## 2014-01-03 NOTE — Telephone Encounter (Signed)
Refill Request for Hydrocodone 5/325mg .  LOV 11/16/13. Please advise. Thanks!

## 2014-01-03 NOTE — Telephone Encounter (Signed)
Re-ordered Percocet 5-325 mg every 6 hours for moderate to severe pain.   Reviewed [place] Substance Reporting system prior to reorder

## 2014-02-07 ENCOUNTER — Telehealth: Payer: Self-pay | Admitting: Hematology

## 2014-02-07 NOTE — Telephone Encounter (Signed)
Left message in regards to sickle cell day on 03/01/2014 from 9-3 at the [facility] in [place].  Advised patient to RSVP with Dollene Primrose at [phone].  Also advised that flyer will be coming the mail.

## 2014-02-19 ENCOUNTER — Ambulatory Visit: Payer: BC Managed Care – PPO | Admitting: Family Medicine

## 2014-02-21 ENCOUNTER — Encounter: Payer: Self-pay | Admitting: Family Medicine

## 2014-02-21 ENCOUNTER — Telehealth: Payer: Self-pay | Admitting: Family Medicine

## 2014-02-21 ENCOUNTER — Encounter (HOSPITAL_COMMUNITY): Payer: Self-pay | Admitting: *Deleted

## 2014-02-21 ENCOUNTER — Non-Acute Institutional Stay (HOSPITAL_COMMUNITY)
Admission: AD | Admit: 2014-02-21 | Discharge: 2014-02-21 | Disposition: A | Discharge status: Home / Self Care | Payer: BC Managed Care – PPO | Attending: Internal Medicine | Admitting: Internal Medicine

## 2014-02-21 ENCOUNTER — Telehealth (HOSPITAL_COMMUNITY): Payer: Self-pay | Admitting: *Deleted

## 2014-02-21 DIAGNOSIS — D5700 Hb-SS disease with crisis, unspecified: Secondary | ICD-10-CM | POA: Diagnosis present

## 2014-02-21 DIAGNOSIS — D57819 Other sickle-cell disorders with crisis, unspecified: Secondary | ICD-10-CM | POA: Diagnosis not present

## 2014-02-21 LAB — LACTATE DEHYDROGENASE: LDH: 230 U/L (ref 94–250)

## 2014-02-21 LAB — CBC WITH DIFFERENTIAL/PLATELET
Basophils Absolute: 0 10*3/uL (ref 0.0–0.1)
Basophils Relative: 0 % (ref 0–1)
EOS ABS: 0 10*3/uL (ref 0.0–0.7)
EOS PCT: 0 % (ref 0–5)
HCT: 32.9 % — ABNORMAL LOW (ref 39.0–52.0)
HEMOGLOBIN: 11.6 g/dL — AB (ref 13.0–17.0)
LYMPHS ABS: 2 10*3/uL (ref 0.7–4.0)
Lymphocytes Relative: 29 % (ref 12–46)
MCH: 27.2 pg (ref 26.0–34.0)
MCHC: 35.3 g/dL (ref 30.0–36.0)
MCV: 77 fL — ABNORMAL LOW (ref 78.0–100.0)
MONOS PCT: 7 % (ref 3–12)
Monocytes Absolute: 0.5 10*3/uL (ref 0.1–1.0)
NEUTROS PCT: 64 % (ref 43–77)
Neutro Abs: 4.4 10*3/uL (ref 1.7–7.7)
PLATELETS: 112 10*3/uL — AB (ref 150–400)
RBC: 4.27 MIL/uL (ref 4.22–5.81)
RDW: 15.4 % (ref 11.5–15.5)
WBC: 6.9 10*3/uL (ref 4.0–10.5)

## 2014-02-21 LAB — RETICULOCYTES
RBC.: 4.27 MIL/uL (ref 4.22–5.81)
Retic Count, Absolute: 140.9 10*3/uL (ref 19.0–186.0)
Retic Ct Pct: 3.3 % — ABNORMAL HIGH (ref 0.4–3.1)

## 2014-02-21 LAB — COMPREHENSIVE METABOLIC PANEL
ALK PHOS: 106 U/L (ref 39–117)
ALT: 12 U/L (ref 0–53)
AST: 22 U/L (ref 0–37)
Albumin: 4.2 g/dL (ref 3.5–5.2)
Anion gap: 13 (ref 5–15)
BUN: 8 mg/dL (ref 6–23)
CO2: 24 mEq/L (ref 19–32)
Calcium: 9.4 mg/dL (ref 8.4–10.5)
Chloride: 103 mEq/L (ref 96–112)
Creatinine, Ser: 0.83 mg/dL (ref 0.50–1.35)
GLUCOSE: 161 mg/dL — AB (ref 70–99)
POTASSIUM: 3.7 meq/L (ref 3.7–5.3)
Sodium: 140 mEq/L (ref 137–147)
Total Bilirubin: 1.2 mg/dL (ref 0.3–1.2)
Total Protein: 7.6 g/dL (ref 6.0–8.3)

## 2014-02-21 MED ORDER — HYDROMORPHONE HCL PF 2 MG/ML IJ SOLN
2.0000 mg | Freq: Once | INTRAMUSCULAR | Status: AC
  Administered 2014-02-21: 2 mg via INTRAVENOUS
  Filled 2014-02-21: qty 1

## 2014-02-21 MED ORDER — HYDROCODONE-ACETAMINOPHEN 5-325 MG PO TABS: 1.0000 | ORAL_TABLET | Freq: Four times a day (QID) | ORAL | Status: DC | PRN

## 2014-02-21 MED ORDER — FOLIC ACID 800 MCG PO TABS: 800.0000 ug | ORAL_TABLET | Freq: Every day | ORAL | Status: DC

## 2014-02-21 MED ORDER — IBUPROFEN 800 MG PO TABS: 800.0000 mg | ORAL_TABLET | Freq: Three times a day (TID) | ORAL | Status: DC | PRN

## 2014-02-21 MED ORDER — KETOROLAC TROMETHAMINE 30 MG/ML IJ SOLN
30.0000 mg | Freq: Four times a day (QID) | INTRAMUSCULAR | Status: DC
  Administered 2014-02-21: 30 mg via INTRAVENOUS
  Filled 2014-02-21: qty 1

## 2014-02-21 MED ORDER — HYDROMORPHONE HCL PF 2 MG/ML IJ SOLN
1.0000 mg | Freq: Once | INTRAMUSCULAR | Status: AC
  Administered 2014-02-21: 1 mg via INTRAVENOUS
  Filled 2014-02-21: qty 1

## 2014-02-21 MED ORDER — DEXTROSE-NACL 5-0.45 % IV SOLN
INTRAVENOUS | Status: DC
  Administered 2014-02-21: 16:00:00 via INTRAVENOUS

## 2014-02-21 NOTE — Telephone Encounter (Signed)
Received patient call, patient c/o being in crisis for a couple days with left leg pain worsening, rating pain 7/10. Patient reports taking ibuprofen this AM and  out of oxycodone (approximately x1 week). Patient denies all other pertinent ROS per [facility] Triage Assessment. Informed patient that this RN will notify provider on call and patient is to receive a return call back with recommendation. Patient acknowledges.

## 2014-02-21 NOTE — Telephone Encounter (Signed)
Meds ordered this encounter  Medications  . ibuprofen (ADVIL,MOTRIN) 800 MG tablet    Sig: Take 1 tablet (800 mg total) by mouth every 8 (eight) hours as needed for mild pain or moderate pain.    Dispense:  30 tablet    Refill:  1  . folic acid (FOLVITE) 800 MCG tablet    Sig: Take 1 tablet (800 mcg total) by mouth daily.    Dispense:  30 tablet    Refill:  6    Order Specific Question:  Supervising Provider    Answer:  Marthann Schiller A [[id number]]

## 2014-02-21 NOTE — Telephone Encounter (Signed)
Meds ordered this encounter  Medications  . HYDROcodone-acetaminophen (NORCO) 5-325 MG per tablet    Sig: Take 1 tablet by mouth every 6 (six) hours as needed for moderate pain.    Dispense:  60 tablet    Refill:  0    Order Specific Question:  Supervising Provider    Answer:  MATTHEWS, MICHELLE A [[id number]]  Reviewed [place] Substance Reporting system prior to reorder Binta Statzer M, FNP 

## 2014-02-21 NOTE — Progress Notes (Signed)
Patient  Sickle cell crisis- pain goal satisfied according to patient. Pain level currently 6. Discharge instructions, work note, and prescriptions given to patient. IV removed - intact.

## 2014-02-21 NOTE — H&P (Signed)
[facility] History and Physical  RUMALDO DIFATTA ZOX:[id number] DOB: [birth year] DOA: 02/21/2014   PCP: MATTHEWS,MICHELLE A., MD   Chief Complaint: Pain in left leg  HPI: Jarquavious Fentress is a 23 yo male with HbSC disease who presents to the day hospital with complaints of pain in his left leg. Pt states that pain in her legs have been present for 2 days and is a 7/10. Pain is throbbing and typical of his pain crisis. He tried taking Norco and ibuprofen which would help slightly but still too painful. He is limping partially due to pain. He denies any fever, cough, SOB, HA, weakness, difficulty with urination/bowel movements, or recent illness.   Review of Systems:  Constitutional: No weight loss, night sweats, Fevers, chills, fatigue.  HEENT: No headaches, dizziness, seizures, vision changes, difficulty swallowing,Tooth/dental problems,Sore throat, No sneezing, itching, ear ache, nasal congestion, post nasal drip,  Cardio-vascular: No chest pain, Orthopnea, PND, swelling in lower extremities, anasarca, dizziness, palpitations  GI: No heartburn, indigestion, abdominal pain, nausea, vomiting, diarrhea, change in bowel habits, loss of appetite  Resp: No shortness of breath with exertion or at rest. No excess mucus, no productive cough, No non-productive cough, No coughing up of blood.No change in color of mucus.No wheezing.No chest wall deformity  Skin: no rash or lesions.  GU: no dysuria, change in color of urine, no urgency or frequency. No flank pain.  Musculoskeletal: No joint pain or swelling. No decreased range of motion. No back pain. +leg pain Psych: No change in mood or affect. No depression or anxiety. No memory loss.    Past Medical History  Diagnosis Date  . Sickle cell disease    Past Surgical History  Procedure Laterality Date  . Cholecystectomy     Social History:  reports that he has quit smoking. He has never used smokeless tobacco. He reports that he drinks  alcohol. He reports that he does not use illicit drugs.  Allergies  Allergen Reactions  . Shellfish Allergy Anaphylaxis    No family history on file.  Prior to Admission medications   Medication Sig Start Date End Date Taking? Authorizing Provider  folic acid (FOLVITE) 800 MCG tablet Take 800 mcg by mouth daily.   Yes Historical Provider, MD  HYDROcodone-acetaminophen (NORCO) 5-325 MG per tablet Take 1 tablet by mouth every 6 (six) hours as needed for moderate pain. 01/03/14  Yes Massie Maroon, FNP  ibuprofen (ADVIL,MOTRIN) 800 MG tablet Take 1 tablet (800 mg total) by mouth every 8 (eight) hours as needed for mild pain or moderate pain. 10/31/13  Yes Massie Maroon, FNP   Physical Exam: Filed Vitals:   02/21/14 1530 02/21/14 1644  BP: 99/50 126/55  Pulse: 93 90  Temp: 98.6 F (37 C) 98.2 F (36.8 C)  TempSrc: Oral Oral  Resp: 18   SpO2: 100% 100%   General: Alert, awake, oriented x3, in no acute distress.  HEENT: [place]/AT PEERL, EOMI Neck: Trachea midline,  no masses, no thyromegal,y no JVD, no carotid bruit OROPHARYNX:  Moist, No exudate/ erythema/lesions.  Heart: Regular rate and rhythm, without murmurs, rubs, gallops, PMI non-displaced, no heaves or thrills on palpation.  Lungs: Clear to auscultation, no wheezing or rhonchi noted. No increased vocal fremitus resonant to percussion  Abdomen: Soft, nontender, nondistended, positive bowel sounds, no masses no hepatosplenomegaly noted.  Neuro: No focal neurological deficits noted cranial nerves II through XII grossly intact. Nl gait. 5/5 extremity strength.    Labs on Admission:  Basic Metabolic Panel:  Recent Labs Lab 02/21/14 1541  NA 140  K 3.7  CL 103  CO2 24  GLUCOSE 161*  BUN 8  CREATININE 0.83  CALCIUM 9.4   Liver Function Tests:  Recent Labs Lab 02/21/14 1541  AST 22  ALT 12  ALKPHOS 106  BILITOT 1.2  PROT 7.6  ALBUMIN 4.2   CBC:  Recent Labs Lab 02/21/14 1541  WBC 6.9  NEUTROABS 4.4   HGB 11.6*  HCT 32.9*  MCV 77.0*  PLT 112*    Assessment/Plan: Active Problems:   Sickle cell anemia with crisis  Pt will be treated with initially treated with weight based rapid re-dosing IVF and Toradol . If pain is above 5/10 then Pt will be transitioned to a weight based Dilaudid PCA . Patient will be re-evaluated for pain in the context of function and relationship to baseline as care progresses.  Time spend: 30 min Code Status: Full Family Communication: none Disposition Plan: none  Serina Cowper, MD  Pager [phone]  If 7PM-7AM, please contact night-coverage [email or web address] Password [email or web address] 02/21/2014, 4:08 PM

## 2014-02-21 NOTE — Discharge Summary (Signed)
Physician Discharge Summary  William Jennings ZOX:[id number] DOB: September 30, 1990 DOA: 02/21/2014  PCP: Altha Harm., MD  Admit date: 02/21/2014 Discharge date: 02/21/2014  Discharge Diagnoses:  Active Problems:   Sickle cell anemia with crisis   Discharge Condition: stable/improved  Disposition: Home  Diet:Regular  Wt Readings from Last 3 Encounters:  11/16/13 146 lb (66.225 kg)  10/31/13 140 lb (63.504 kg)  09/28/13 140 lb (63.504 kg)    History of present illness:  William Jennings is a 23 yo male with HbSC disease who presents to the day hospital with complaints of pain in his left leg. Pt states that pain in her legs have been present for 2 days and is a 7/10. Pain is throbbing and typical of his pain crisis. He tried taking Norco and ibuprofen which would help slightly but still too painful. He is limping partially due to pain. He denies any fever, cough, SOB, HA, weakness, difficulty with urination/bowel movements, or recent illness.    Hospital Course:  Patient presented with pain characteristic of acute vaso-occlusive crisis. Pt's pain was treated with bolus IV weight based Dilaudid and Ketorolac. He was given IV fluids. His pain decreased down to a 5/10 and he remained physically functional. Pt was discharged with instructions to rest, drink plenty of fluids, and take his Norco q6h for the first day, then as needed.   Discharge Exam:  Filed Vitals:   02/21/14 1644  BP: 126/55  Pulse: 90  Temp: 98.2 F (36.8 C)  Resp:    Filed Vitals:   02/21/14 1530 02/21/14 1644  BP: 99/50 126/55  Pulse: 93 90  Temp: 98.6 F (37 C) 98.2 F (36.8 C)  TempSrc: Oral Oral  Resp: 18   SpO2: 100% 100%   General: Alert, awake, oriented x3, in no acute distress.  HEENT: [place]/AT PEERL, EOMI Neck: Trachea midline,  no masses, no thyromegal,y no JVD, no carotid bruit OROPHARYNX:  Moist, No exudate/ erythema/lesions.  Heart: Regular rate and rhythm, without murmurs, rubs, gallops   Lungs: Clear to auscultation, no wheezing or rhonchi noted. Abdomen: Soft, nontender, nondistended, positive bowel sounds, no masses no hepatosplenomegaly noted. Neuro: No focal neurological deficits noted cranial nerves II through XII grossly intact.  Strength 5 out of 5 in bilateral upper and lower extremities. Musculoskeletal: No warm swelling or erythema around joints, no spinal tenderness noted. Psychiatric: Patient alert and oriented x3, good insight and cognition, good recent to remote recall.   Discharge Instructions F/u with PCP in 2-3 weeks.    Medication List    ASK your doctor about these medications       folic acid 800 MCG tablet  Commonly known as:  FOLVITE  Take 800 mcg by mouth daily.     HYDROcodone-acetaminophen 5-325 MG per tablet  Commonly known as:  NORCO  Take 1 tablet by mouth every 6 (six) hours as needed for moderate pain.     ibuprofen 800 MG tablet  Commonly known as:  ADVIL,MOTRIN  Take 1 tablet (800 mg total) by mouth every 8 (eight) hours as needed for mild pain or moderate pain.        Labs: Basic Metabolic Panel:  Recent Labs Lab 02/21/14 1541  NA 140  K 3.7  CL 103  CO2 24  GLUCOSE 161*  BUN 8  CREATININE 0.83  CALCIUM 9.4   Liver Function Tests:  Recent Labs Lab 02/21/14 1541  AST 22  ALT 12  ALKPHOS 106  BILITOT 1.2  PROT 7.6  ALBUMIN 4.2   CBC:  Recent Labs Lab 02/21/14 1541  WBC 6.9  NEUTROABS 4.4  HGB 11.6*  HCT 32.9*  MCV 77.0*  PLT 112*   Time coordinating discharge: 30 minutes  Signed:  Serina Cowper  02/21/2014, 5:05 PM

## 2014-02-21 NOTE — Telephone Encounter (Addendum)
Return call back #2 to followup with patient concerning message left today. Instructed patient to come to day hospital. Patient acknowledges.

## 2014-02-21 NOTE — Telephone Encounter (Signed)
Returned call back, no answer message left.

## 2014-02-22 ENCOUNTER — Telehealth (HOSPITAL_COMMUNITY): Payer: Self-pay | Admitting: Hematology

## 2014-02-22 NOTE — Telephone Encounter (Signed)
Spoke with Dr. Nathanial Millman, and she advises patient to fill prescription for pain medication and take it as prescribed.  If no relief, call the [facility] back around 2p.m.  Patient verbalizes understanding.

## 2014-02-22 NOTE — Telephone Encounter (Signed)
Patient called in to advised that his left leg is still hurting.  Patient rates pain 8/10 on pain scale.  Patient has taken ibuprofen.  Per patient, he has not had time to fill prescription for norco.  I explained I would notify the physician and give him a call back.  Patient verbalizes understanding.

## 2014-05-03 ENCOUNTER — Telehealth: Payer: Self-pay | Admitting: Internal Medicine

## 2014-05-03 DIAGNOSIS — D5700 Hb-SS disease with crisis, unspecified: Secondary | ICD-10-CM

## 2014-05-03 MED ORDER — HYDROCODONE-ACETAMINOPHEN 5-325 MG PO TABS: 1.0000 | ORAL_TABLET | Freq: Four times a day (QID) | ORAL | Status: DC | PRN

## 2014-05-03 NOTE — Telephone Encounter (Signed)
Meds ordered this encounter  Medications  . HYDROcodone-acetaminophen (NORCO) 5-325 MG per tablet    Sig: Take 1 tablet by mouth every 6 (six) hours as needed for moderate pain.    Dispense:  60 tablet    Refill:  0    Order Specific Question:  Supervising Provider    Answer:  Marthann SchillerMATTHEWS, MICHELLE A [[id number]]  Reviewed [place] Substance Reporting system prior to reorder Massie MaroonHollis,Tywon Niday M, FNP

## 2014-05-03 NOTE — Telephone Encounter (Signed)
REFILL REQUEST FOR HYDROCODONE 5/325MG . LOV6/10/2013

## 2014-05-11 ENCOUNTER — Encounter (HOSPITAL_BASED_OUTPATIENT_CLINIC_OR_DEPARTMENT_OTHER): Payer: Self-pay | Admitting: *Deleted

## 2014-05-11 ENCOUNTER — Emergency Department (HOSPITAL_BASED_OUTPATIENT_CLINIC_OR_DEPARTMENT_OTHER)
Admission: EM | Admit: 2014-05-11 | Discharge: 2014-05-11 | Disposition: A | Discharge status: Home / Self Care | Payer: BC Managed Care – PPO | Attending: Emergency Medicine | Admitting: Emergency Medicine

## 2014-05-11 DIAGNOSIS — D5700 Hb-SS disease with crisis, unspecified: Secondary | ICD-10-CM | POA: Diagnosis not present

## 2014-05-11 DIAGNOSIS — M79605 Pain in left leg: Secondary | ICD-10-CM | POA: Diagnosis present

## 2014-05-11 DIAGNOSIS — Z79899 Other long term (current) drug therapy: Secondary | ICD-10-CM | POA: Insufficient documentation

## 2014-05-11 DIAGNOSIS — Z87891 Personal history of nicotine dependence: Secondary | ICD-10-CM | POA: Diagnosis not present

## 2014-05-11 LAB — CBC WITH DIFFERENTIAL/PLATELET
BASOS PCT: 0 % (ref 0–1)
Basophils Absolute: 0 10*3/uL (ref 0.0–0.1)
EOS PCT: 0 % (ref 0–5)
Eosinophils Absolute: 0 10*3/uL (ref 0.0–0.7)
HCT: 31.7 % — ABNORMAL LOW (ref 39.0–52.0)
HEMOGLOBIN: 11.6 g/dL — AB (ref 13.0–17.0)
LYMPHS ABS: 3.7 10*3/uL (ref 0.7–4.0)
Lymphocytes Relative: 35 % (ref 12–46)
MCH: 27.7 pg (ref 26.0–34.0)
MCHC: 36.6 g/dL — ABNORMAL HIGH (ref 30.0–36.0)
MCV: 75.7 fL — AB (ref 78.0–100.0)
MONO ABS: 1.2 10*3/uL — AB (ref 0.1–1.0)
MONOS PCT: 11 % (ref 3–12)
NEUTROS ABS: 5.6 10*3/uL (ref 1.7–7.7)
Neutrophils Relative %: 54 % (ref 43–77)
PLATELETS: 147 10*3/uL — AB (ref 150–400)
RBC: 4.19 MIL/uL — ABNORMAL LOW (ref 4.22–5.81)
RDW: 14.7 % (ref 11.5–15.5)
WBC: 10.5 10*3/uL (ref 4.0–10.5)

## 2014-05-11 LAB — COMPREHENSIVE METABOLIC PANEL
ALK PHOS: 108 U/L (ref 39–117)
ALT: 9 U/L (ref 0–53)
AST: 27 U/L (ref 0–37)
Albumin: 4.4 g/dL (ref 3.5–5.2)
Anion gap: 10 (ref 5–15)
BUN: 6 mg/dL (ref 6–23)
CO2: 27 meq/L (ref 19–32)
Calcium: 9.7 mg/dL (ref 8.4–10.5)
Chloride: 105 mEq/L (ref 96–112)
Creatinine, Ser: 0.8 mg/dL (ref 0.50–1.35)
GFR calc Af Amer: >90 mL/min (ref >90)
GFR calc non Af Amer: >90 mL/min (ref >90)
Glucose, Bld: 83 mg/dL (ref 70–99)
POTASSIUM: 3.8 meq/L (ref 3.7–5.3)
SODIUM: 142 meq/L (ref 137–147)
TOTAL PROTEIN: 8 g/dL (ref 6.0–8.3)
Total Bilirubin: 1.1 mg/dL (ref 0.3–1.2)

## 2014-05-11 MED ORDER — KETOROLAC TROMETHAMINE 30 MG/ML IJ SOLN
30.0000 mg | Freq: Once | INTRAMUSCULAR | Status: AC
  Administered 2014-05-11: 30 mg via INTRAVENOUS
  Filled 2014-05-11: qty 1

## 2014-05-11 MED ORDER — HYDROMORPHONE HCL 1 MG/ML IJ SOLN
1.0000 mg | Freq: Once | INTRAMUSCULAR | Status: AC
  Administered 2014-05-11: 1 mg via INTRAVENOUS
  Filled 2014-05-11: qty 1

## 2014-05-11 MED ORDER — SODIUM CHLORIDE 0.9 % IV SOLN
Freq: Once | INTRAVENOUS | Status: AC
  Administered 2014-05-11: 16:00:00 via INTRAVENOUS

## 2014-05-11 MED ORDER — OXYCODONE-ACETAMINOPHEN 5-325 MG PO TABS: 1.0000 | ORAL_TABLET | Freq: Four times a day (QID) | ORAL | Status: DC | PRN

## 2014-05-11 NOTE — ED Notes (Signed)
Pt placed on cardiac monitor 

## 2014-05-11 NOTE — ED Provider Notes (Signed)
CSN: [id number]     Arrival date & time 05/11/14  1259 History  This chart was scribed for Tilden FossaElizabeth Ariyan Brisendine, MD by Gwenyth Oberatherine Macek, ED Scribe. This patient was seen in room MH08/MH08 and the patient's care was started at 4:03 PM  .   Chief Complaint  Patient presents with  . Sickle Cell Pain Crisis   The history is provided by the patient. No language interpreter was used.    HPI Comments: William Jennings is a 23 y.o. male with a history of sickle cell disease who presents to the Emergency Department complaining of sickle cell crisis in his left leg that started 2 days ago. He notes limited ROM due to pain in his left leg as associated symptom. Pt states that pain is located in the muscle. He has tried Hydrocodone and Ibuprofen for the last two days with no relief. Pt denies injury to the area. He states pain is typical for his sickle cell symptoms, but that most of his crises are in his arm and back. His last hospitalization for sickle cell disease was in March 2015. Pt reports that he takes folic acid regularly and Ibuprofen and Hydrocodone for pain. He does not have pain contract. Pt currently smokes cigarettes, but notes he has decreased use recently. He denies CP, SOB, fevers, vomiting, diarrhea, abdominal pain and numbness as associated symptoms.    Past Medical History  Diagnosis Date  . Sickle cell disease    Past Surgical History  Procedure Laterality Date  . Cholecystectomy     No family history on file. History  Substance Use Topics  . Smoking status: Former Games developermoker  . Smokeless tobacco: Never Used  . Alcohol Use: Yes     Comment: occasionally    Review of Systems  Constitutional: Negative for fever.  Respiratory: Negative for shortness of breath.   Cardiovascular: Negative for chest pain.  Gastrointestinal: Negative for nausea, vomiting, abdominal pain and diarrhea.  Musculoskeletal: Positive for myalgias and arthralgias.  Neurological: Negative for numbness.  All other  systems reviewed and are negative.     Allergies  Shellfish allergy  Home Medications   Prior to Admission medications   Medication Sig Start Date End Date Taking? Authorizing Provider  folic acid (FOLVITE) 800 MCG tablet Take 1 tablet (800 mcg total) by mouth daily. 02/21/14   Massie MaroonLachina M Hollis, FNP  HYDROcodone-acetaminophen (NORCO) 5-325 MG per tablet Take 1 tablet by mouth every 6 (six) hours as needed for moderate pain. 05/03/14   Massie MaroonLachina M Hollis, FNP  ibuprofen (ADVIL,MOTRIN) 800 MG tablet Take 1 tablet (800 mg total) by mouth every 8 (eight) hours as needed for mild pain or moderate pain. 02/21/14   Massie MaroonLachina M Hollis, FNP   BP 127/55 mmHg  Pulse 91  Temp(Src) 98.7 F (37.1 C) (Oral)  Resp 18  Ht 5\' 8"  (1.727 m)  Wt 145 lb (65.772 kg)  BMI 22.05 kg/m2  SpO2 99% Physical Exam  Constitutional: He is oriented to person, place, and time. He appears well-developed and well-nourished.  HENT:  Head: Normocephalic and atraumatic.  Cardiovascular: Normal rate and regular rhythm.   No murmur heard. Pulmonary/Chest: Effort normal and breath sounds normal. No respiratory distress.  Abdominal: There is no tenderness. There is no rebound and no guarding.  Musculoskeletal: He exhibits no edema.  No CVA tenderness. No T/L spine tenderness. 2+ DP pulses. Diffuse tenderness to palpation over left thigh/calf.  Neurological: He is alert and oriented to person, place, and time.  Sensation to light touch intact to lower extremities. 5/5 strength in lower extremities.   Skin: Skin is warm and dry.  Nursing note and vitals reviewed.   ED Course  Procedures (including critical care time) DIAGNOSTIC STUDIES: Oxygen Saturation is 99% on RA, normal by my interpretation.    COORDINATION OF CARE: 4:12 PM Discussed treatment plan with pt at bedside and pt agreed to plan.  4:50 PM Pt states partial improvement, but that he has continued pain.   Labs Review Labs Reviewed  CBC WITH  DIFFERENTIAL - Abnormal; Notable for the following:    RBC 4.19 (*)    Hemoglobin 11.6 (*)    HCT 31.7 (*)    MCV 75.7 (*)    MCHC 36.6 (*)    Platelets 147 (*)    Monocytes Absolute 1.2 (*)    All other components within normal limits  COMPREHENSIVE METABOLIC PANEL    Imaging Review No results found.   EKG Interpretation None      MDM   Final diagnoses:  Sickle cell pain crisis    Patient with history of sickle cell disease here with acute pain crisis. Patient is nontoxic on exam. Patient without any evidence of serious bacterial infection. Clinical picture not consistent with DVT or radicular pain. Patient's pain was improved in the emergency department upon discharge. Discussed with patient plan for close PCP follow-up and return precautions.   I personally performed the services described in this documentation, which was scribed in my presence. The recorded information has been reviewed and is accurate.      Tilden FossaElizabeth Zian Mohamed, MD 05/12/14 [id number]

## 2014-05-11 NOTE — Discharge Instructions (Signed)
Sickle Cell Anemia, Adult °Sickle cell anemia is a condition in which red blood cells have an abnormal "sickle" shape. This abnormal shape shortens the cells' life span, which results in a lower than normal concentration of red blood cells in the blood. The sickle shape also causes the cells to clump together and block free blood flow through the blood vessels. As a result, the tissues and organs of the body do not receive enough oxygen. Sickle cell anemia causes organ damage and pain and increases the risk of infection. °CAUSES  °Sickle cell anemia is a genetic disorder. Those who receive two copies of the gene have the condition, and those who receive one copy have the trait. °RISK FACTORS °The sickle cell gene is most common in people whose families originated in [place]. Other areas of the globe where sickle cell trait occurs include the Mediterranean, [place] and [place], the [place], and the [place].  °SIGNS AND SYMPTOMS °· Pain, especially in the extremities, back, chest, or abdomen (common). The pain may start suddenly or may develop following an illness, especially if there is dehydration. Pain can also occur due to overexertion or exposure to extreme temperature changes. °· Frequent severe bacterial infections, especially certain types of pneumonia and meningitis. °· Pain and swelling in the hands and feet. °· Decreased activity.   °· Loss of appetite.   °· Change in behavior. °· Headaches. °· Seizures. °· Shortness of breath or difficulty breathing. °· Vision changes. °· Skin ulcers. °Those with the trait may not have symptoms or they may have mild symptoms.  °DIAGNOSIS  °Sickle cell anemia is diagnosed with blood tests that demonstrate the genetic trait. It is often diagnosed during the newborn period, due to mandatory testing nationwide. A variety of blood tests, X-rays, CT scans, MRI scans, ultrasounds, and lung function tests may also be done to monitor the condition. °TREATMENT  °Sickle  cell anemia may be treated with: °· Medicines. You may be given pain medicines, antibiotic medicines (to treat and prevent infections) or medicines to increase the production of certain types of hemoglobin. °· Fluids. °· Oxygen. °· Blood transfusions. °HOME CARE INSTRUCTIONS  °· Drink enough fluid to keep your urine clear or pale yellow. Increase your fluid intake in hot weather and during exercise. °· Do not smoke. Smoking lowers oxygen levels in the blood.   °· Only take over-the-counter or prescription medicines for pain, fever, or discomfort as directed by your health care provider. °· Take antibiotics as directed by your health care provider. Make sure you finish them it even if you start to feel better.   °· Take supplements as directed by your health care provider.   °· Consider wearing a medical alert bracelet. This tells anyone caring for you in an emergency of your condition.   °· When traveling, keep your medical information, health care provider's names, and the medicines you take with you at all times.   °· If you develop a fever, do not take medicines to reduce the fever right away. This could cover up a problem that is developing. Notify your health care provider. °· Keep all follow-up appointments with your health care provider. Sickle cell anemia requires regular medical care. °SEEK MEDICAL CARE IF: ° You have a fever. °SEEK IMMEDIATE MEDICAL CARE IF:  °· You feel dizzy or faint.   °· You have new abdominal pain, especially on the left side near the stomach area.   °· You develop a persistent, often uncomfortable and painful penile erection (priapism). If this is not treated immediately it   will lead to impotence.   °· You have numbness your arms or legs or you have a hard time moving them.   °· You have a hard time with speech.   °· You have a fever or persistent symptoms for more than 2-3 days.   °· You have a fever and your symptoms suddenly get worse.   °· You have signs or symptoms of infection.  These include:   °¨ Chills.   °¨ Abnormal tiredness (lethargy).   °¨ Irritability.   °¨ Poor eating.   °¨ Vomiting.   °· You develop pain that is not helped with medicine.   °· You develop shortness of breath. °· You have pain in your chest.   °· You are coughing up pus-like or bloody sputum.   °· You develop a stiff neck. °· Your feet or hands swell or have pain. °· Your abdomen appears bloated. °· You develop joint pain. °MAKE SURE YOU: °· Understand these instructions. °Document Released: 09/08/2005 Document Revised: 10/15/2013 Document Reviewed: 01/10/2013 °ExitCare® Patient Information ©2015 [employer]. This information is not intended to replace advice given to you by your health care provider. Make sure you discuss any questions you have with your health care provider. ° °

## 2014-05-11 NOTE — ED Notes (Addendum)
Patient states he is having a sickle cell crisis, pain started Thursday. Has taken ibuprofen and hydrocodone today without relief.

## 2014-05-17 ENCOUNTER — Ambulatory Visit: Payer: BC Managed Care – PPO | Admitting: Family Medicine

## 2014-06-04 ENCOUNTER — Encounter (HOSPITAL_BASED_OUTPATIENT_CLINIC_OR_DEPARTMENT_OTHER): Payer: Self-pay | Admitting: Emergency Medicine

## 2014-06-04 ENCOUNTER — Emergency Department (HOSPITAL_COMMUNITY): Payer: BC Managed Care – PPO

## 2014-06-04 ENCOUNTER — Emergency Department (HOSPITAL_BASED_OUTPATIENT_CLINIC_OR_DEPARTMENT_OTHER)
Admission: EM | Admit: 2014-06-04 | Discharge: 2014-06-04 | Disposition: A | Discharge status: Other Acute Inpatient Hospital | Payer: BC Managed Care – PPO | Attending: Emergency Medicine | Admitting: Emergency Medicine

## 2014-06-04 DIAGNOSIS — N50819 Testicular pain, unspecified: Secondary | ICD-10-CM

## 2014-06-04 DIAGNOSIS — N508 Other specified disorders of male genital organs: Secondary | ICD-10-CM | POA: Insufficient documentation

## 2014-06-04 DIAGNOSIS — N451 Epididymitis: Secondary | ICD-10-CM

## 2014-06-04 DIAGNOSIS — N50812 Left testicular pain: Secondary | ICD-10-CM

## 2014-06-04 LAB — BASIC METABOLIC PANEL
ANION GAP: 7 (ref 5–15)
BUN: 10 mg/dL (ref 6–23)
CALCIUM: 9.2 mg/dL (ref 8.4–10.5)
CO2: 25 mmol/L (ref 19–32)
CREATININE: 0.82 mg/dL (ref 0.50–1.35)
Chloride: 106 mEq/L (ref 96–112)
Glucose, Bld: 169 mg/dL — ABNORMAL HIGH (ref 70–99)
Potassium: 3.5 mmol/L (ref 3.5–5.1)
Sodium: 138 mmol/L (ref 135–145)

## 2014-06-04 LAB — CBC WITH DIFFERENTIAL/PLATELET
BASOS ABS: 0 10*3/uL (ref 0.0–0.1)
Basophils Relative: 0 % (ref 0–1)
EOS ABS: 0 10*3/uL (ref 0.0–0.7)
Eosinophils Relative: 0 % (ref 0–5)
HCT: 33.4 % — ABNORMAL LOW (ref 39.0–52.0)
Hemoglobin: 12.2 g/dL — ABNORMAL LOW (ref 13.0–17.0)
LYMPHS PCT: 20 % (ref 12–46)
Lymphs Abs: 2.4 10*3/uL (ref 0.7–4.0)
MCH: 27.9 pg (ref 26.0–34.0)
MCHC: 36.5 g/dL — AB (ref 30.0–36.0)
MCV: 76.4 fL — ABNORMAL LOW (ref 78.0–100.0)
Monocytes Absolute: 0.8 10*3/uL (ref 0.1–1.0)
Monocytes Relative: 7 % (ref 3–12)
NEUTROS PCT: 73 % (ref 43–77)
Neutro Abs: 8.9 10*3/uL — ABNORMAL HIGH (ref 1.7–7.7)
Platelets: 158 10*3/uL (ref 150–400)
RBC: 4.37 MIL/uL (ref 4.22–5.81)
RDW: 14.8 % (ref 11.5–15.5)
WBC: 12.1 10*3/uL — ABNORMAL HIGH (ref 4.0–10.5)

## 2014-06-04 LAB — URINALYSIS, ROUTINE W REFLEX MICROSCOPIC
Bilirubin Urine: NEGATIVE
Glucose, UA: NEGATIVE mg/dL
HGB URINE DIPSTICK: NEGATIVE
KETONES UR: NEGATIVE mg/dL
Nitrite: NEGATIVE
Protein, ur: NEGATIVE mg/dL
SPECIFIC GRAVITY, URINE: 1.014 (ref 1.005–1.030)
Urobilinogen, UA: 1 mg/dL (ref 0.0–1.0)
pH: 6 (ref 5.0–8.0)

## 2014-06-04 LAB — URINE MICROSCOPIC-ADD ON

## 2014-06-04 LAB — RETICULOCYTES
RBC.: 4.35 MIL/uL (ref 4.22–5.81)
Retic Count, Absolute: 169.7 10*3/uL (ref 19.0–186.0)
Retic Ct Pct: 3.9 % — ABNORMAL HIGH (ref 0.4–3.1)

## 2014-06-04 MED ORDER — CIPROFLOXACIN HCL 500 MG PO TABS: 500.0000 mg | ORAL_TABLET | Freq: Two times a day (BID) | ORAL | Status: DC

## 2014-06-04 MED ORDER — ONDANSETRON HCL 4 MG/2ML IJ SOLN
4.0000 mg | Freq: Once | INTRAMUSCULAR | Status: AC
  Administered 2014-06-04: 4 mg via INTRAVENOUS
  Filled 2014-06-04: qty 2

## 2014-06-04 MED ORDER — DIPHENHYDRAMINE HCL 50 MG/ML IJ SOLN
25.0000 mg | Freq: Once | INTRAMUSCULAR | Status: AC
  Administered 2014-06-04: 25 mg via INTRAVENOUS
  Filled 2014-06-04: qty 1

## 2014-06-04 MED ORDER — HYDROMORPHONE HCL 1 MG/ML IJ SOLN
1.0000 mg | Freq: Once | INTRAMUSCULAR | Status: AC
  Administered 2014-06-04: 1 mg via INTRAVENOUS
  Filled 2014-06-04: qty 1

## 2014-06-04 MED ORDER — CIPROFLOXACIN HCL 500 MG PO TABS
500.0000 mg | ORAL_TABLET | Freq: Once | ORAL | Status: AC
  Administered 2014-06-04: 500 mg via ORAL
  Filled 2014-06-04: qty 1

## 2014-06-04 MED ORDER — HYDROCODONE-ACETAMINOPHEN 5-325 MG PO TABS: 2.0000 | ORAL_TABLET | ORAL | Status: DC | PRN

## 2014-06-04 MED ORDER — MORPHINE SULFATE 4 MG/ML IJ SOLN
4.0000 mg | INTRAMUSCULAR | Status: DC | PRN
  Administered 2014-06-04: 4 mg via INTRAVENOUS
  Filled 2014-06-04: qty 1

## 2014-06-04 NOTE — ED Notes (Signed)
Patient states that he feels like he has pressure in his bladder and his left testicle is swollen.

## 2014-06-04 NOTE — ED Notes (Signed)
Bed: [id number] Expected date:  Expected time:  Means of arrival:  Comments: [facility] pt

## 2014-06-04 NOTE — ED Provider Notes (Signed)
CSN: [id number]     Arrival date & time 06/04/14  1943 History  This chart was scribed for Tilden FossaElizabeth Dniya Neuhaus, MD by Charline BillsEssence Howell, ED Scribe. The patient was seen in room MH01/MH01. Patient's care was started at 7:56 PM.   Chief Complaint  Patient presents with  . Groin Pain    The history is provided by the patient. No language interpreter was used.   HPI Comments: William JarvisGeovanni D Jennings is a 23 y.o. male, with a h/o sickle cell disease, who presents to the Emergency Department complaining of constant lower abdominal pain onset today. Pt describes the pain as pressure and states that it "feels like someone is sitting on his bladder". He reports associated abdominal distention, L testicular pain onset yesterday, testicular swelling, urinary frequency. He denies dysuria, difficulty urinating, fever, nausea, vomiting, diarrhea, constipation, malodor, urinary incontinence. Pt also denies new sex partners or medication changes. Pt reports h/o cholecystectomy and enlarged spleen.  Past Medical History  Diagnosis Date  . Sickle cell disease    Past Surgical History  Procedure Laterality Date  . Cholecystectomy     History reviewed. No pertinent family history. History  Substance Use Topics  . Smoking status: Former Games developermoker  . Smokeless tobacco: Never Used  . Alcohol Use: Yes     Comment: occasionally    Review of Systems  Constitutional: Negative for fever.  Gastrointestinal: Positive for abdominal pain and abdominal distention. Negative for nausea, vomiting, diarrhea and constipation.  Genitourinary: Positive for frequency and testicular pain. Negative for dysuria and difficulty urinating.  All other systems reviewed and are negative.  Allergies  Shellfish allergy  Home Medications   Prior to Admission medications   Medication Sig Start Date End Date Taking? Authorizing Provider  folic acid (FOLVITE) 800 MCG tablet Take 1 tablet (800 mcg total) by mouth daily. 02/21/14   Massie MaroonLachina M Hollis,  FNP  HYDROcodone-acetaminophen (NORCO) 5-325 MG per tablet Take 1 tablet by mouth every 6 (six) hours as needed for moderate pain. 05/03/14   Massie MaroonLachina M Hollis, FNP  ibuprofen (ADVIL,MOTRIN) 800 MG tablet Take 1 tablet (800 mg total) by mouth every 8 (eight) hours as needed for mild pain or moderate pain. 02/21/14   Massie MaroonLachina M Hollis, FNP  oxyCODONE-acetaminophen (PERCOCET/ROXICET) 5-325 MG per tablet Take 1 tablet by mouth every 6 (six) hours as needed for moderate pain or severe pain. 05/11/14   Tilden FossaElizabeth Elidia Bonenfant, MD   Triage Vitals: BP 121/70 mmHg  Pulse 104  Temp(Src) 98.5 F (36.9 C) (Oral)  Resp 20  Wt 140 lb (63.504 kg)  SpO2 100% Physical Exam  Constitutional: He is oriented to person, place, and time. He appears well-developed and well-nourished.  HENT:  Head: Normocephalic and atraumatic.  Cardiovascular: Normal rate.   No murmur heard. Pulmonary/Chest: Effort normal. No respiratory distress.  Abdominal: Soft. There is no rebound and no guarding.  Minimal suprapubic tenderness  Genitourinary:  Mild to moderate left testicular tenderness and swelling.  No hernia.  2+ femoral pulses bilaterally  Musculoskeletal: He exhibits no edema or tenderness.  Neurological: He is alert and oriented to person, place, and time.  Skin: Skin is warm and dry.  Psychiatric: He has a normal mood and affect. His behavior is normal.  Nursing note and vitals reviewed.   ED Course  Procedures (including critical care time) DIAGNOSTIC STUDIES: Oxygen Saturation is 100% on RA, normal by my interpretation.    COORDINATION OF CARE: 8:02 PM-Discussed treatment plan which includes UA and US with pt  at bedside and pt agreed to plan.   Labs Review Labs Reviewed  URINALYSIS, ROUTINE W REFLEX MICROSCOPIC - Abnormal; Notable for the following:    Leukocytes, UA MODERATE (*)    All other components within normal limits  BASIC METABOLIC PANEL - Abnormal; Notable for the following:    Glucose, Bld 169 (*)     All other components within normal limits  CBC WITH DIFFERENTIAL - Abnormal; Notable for the following:    WBC 12.1 (*)    Hemoglobin 12.2 (*)    HCT 33.4 (*)    MCV 76.4 (*)    MCHC 36.5 (*)    Neutro Abs 8.9 (*)    All other components within normal limits  URINE CULTURE  URINE MICROSCOPIC-ADD ON  RETICULOCYTES   Imaging Review No results found.   EKG Interpretation None      MDM   Final diagnoses:  Testicular pain, left    Patient with history of sickle cell anemia here with lower abdominal pressure as well as left testicular pain. Exam does have some local swelling and tenderness over the left testicle, need to obtain formal ultrasound to evaluate further. Ultrasound is not available at this facility this evening. [facility], [facility]. Labs initiated in the ED at [facility] with [facility] Patient agrees to transfer for further evaluation. Patient will be transferred by private vehicle.  I personally performed the services described in this documentation, which was scribed in my presence. The recorded information has been reviewed and is accurate.    Tilden FossaElizabeth Montia Haslip, MD 06/04/14 2104

## 2014-06-04 NOTE — Discharge Instructions (Signed)
Supportive brief underwear will be more comfortable than boxers. Minimize your upright activity.  Epididymitis Epididymitis is a swelling (inflammation) of the epididymis. The epididymis is a cord-like structure along the back part of the testicle. Epididymitis is usually, but not always, caused by infection. This is usually a sudden problem beginning with chills, fever and pain behind the scrotum and in the testicle. There may be swelling and redness of the testicle. DIAGNOSIS  Physical examination will reveal a tender, swollen epididymis. Sometimes, cultures are obtained from the urine or from prostate secretions to help find out if there is an infection or if the cause is a different problem. Sometimes, blood work is performed to see if your white blood cell count is elevated and if a germ (bacterial) or viral infection is present. Using this knowledge, an appropriate medicine which kills germs (antibiotic) can be chosen by your caregiver. A viral infection causing epididymitis will most often go away (resolve) without treatment. HOME CARE INSTRUCTIONS   Hot sitz baths for 20 minutes, 4 times per day, may help relieve pain.  Only take over-the-counter or prescription medicines for pain, discomfort or fever as directed by your caregiver.  Take all medicines, including antibiotics, as directed. Take the antibiotics for the full prescribed length of time even if you are feeling better.  It is very important to keep all follow-up appointments. SEEK IMMEDIATE MEDICAL CARE IF:   You have a fever.  You have pain not relieved with medicines.  You have any worsening of your problems.  Your pain seems to come and go.  You develop pain, redness, and swelling in the scrotum and surrounding areas. MAKE SURE YOU:   Understand these instructions.  Will watch your condition.  Will get help right away if you are not doing well or get worse. Document Released: 05/28/2000 Document Revised:  08/23/2011 Document Reviewed: 04/17/2009 [facility] Patient Information 2015 [employer] [place]. This information is not intended to replace advice given to you by your health care provider. Make sure you discuss any questions you have with your health care provider.

## 2014-06-04 NOTE — ED Provider Notes (Signed)
CSN: [id number]     Arrival date & time 06/04/14  1943 History   First MD Initiated Contact with Patient 06/04/14 1944     Chief Complaint  Patient presents with  . Groin Pain      HPI  Patient sent for evaluation of her being transferred from that [facility]. He states that he developed some left testicular pain starting yesterday. Tolerating to his suprapubic abdomen. Has noticed a little bit of urinary frequency but no dysuria. No fevers no chills. He noticed that his left testicle "seemed bigger". This was slow in onset upon awakening yesterday.  Past Medical History  Diagnosis Date  . Sickle cell disease    Past Surgical History  Procedure Laterality Date  . Cholecystectomy     History reviewed. No pertinent family history. History  Substance Use Topics  . Smoking status: Former Games developermoker  . Smokeless tobacco: Never Used  . Alcohol Use: Yes     Comment: occasionally    Review of Systems  Constitutional: Negative for fever, chills, diaphoresis, appetite change and fatigue.  HENT: Negative for mouth sores, sore throat and trouble swallowing.   Eyes: Negative for visual disturbance.  Respiratory: Negative for cough, chest tightness, shortness of breath and wheezing.   Cardiovascular: Negative for chest pain.  Gastrointestinal: Negative for nausea, vomiting, abdominal pain, diarrhea and abdominal distention.  Endocrine: Negative for polydipsia, polyphagia and polyuria.  Genitourinary: Positive for frequency, scrotal swelling and testicular pain. Negative for dysuria and hematuria.  Musculoskeletal: Negative for gait problem.  Skin: Negative for color change, pallor and rash.  Neurological: Negative for dizziness, syncope, light-headedness and headaches.  Hematological: Does not bruise/bleed easily.  Psychiatric/Behavioral: Negative for behavioral problems and confusion.      Allergies  Shellfish allergy  Home Medications   Prior to Admission medications     Medication Sig Start Date End Date Taking? Authorizing Provider  folic acid (FOLVITE) 800 MCG tablet Take 1 tablet (800 mcg total) by mouth daily. 02/21/14  Yes Massie MaroonLachina M Hollis, FNP  HYDROcodone-acetaminophen (NORCO) 5-325 MG per tablet Take 1 tablet by mouth every 6 (six) hours as needed for moderate pain. 05/03/14  Yes Massie MaroonLachina M Hollis, FNP  ibuprofen (ADVIL,MOTRIN) 800 MG tablet Take 1 tablet (800 mg total) by mouth every 8 (eight) hours as needed for mild pain or moderate pain. 02/21/14  Yes Massie MaroonLachina M Hollis, FNP  ciprofloxacin (CIPRO) 500 MG tablet Take 1 tablet (500 mg total) by mouth every 12 (twelve) hours. 06/04/14   Rolland PorterMark Torie Towle, MD  HYDROcodone-acetaminophen (NORCO/VICODIN) 5-325 MG per tablet Take 2 tablets by mouth every 4 (four) hours as needed. 06/04/14   Rolland PorterMark Aniel Hubble, MD  oxyCODONE-acetaminophen (PERCOCET/ROXICET) 5-325 MG per tablet Take 1 tablet by mouth every 6 (six) hours as needed for moderate pain or severe pain. Patient not taking: Reported on 06/04/2014 05/11/14   Tilden FossaElizabeth Rees, MD   BP 108/42 mmHg  Pulse 101  Temp(Src) 98.5 F (36.9 C) (Oral)  Resp 18  Wt 140 lb (63.504 kg)  SpO2 100% Physical Exam  Constitutional: He is oriented to person, place, and time. He appears well-developed and well-nourished. No distress.  HENT:  Head: Normocephalic.  Eyes: Conjunctivae are normal. Pupils are equal, round, and reactive to light. No scleral icterus.  Neck: Normal range of motion. Neck supple. No thyromegaly present.  Cardiovascular: Normal rate and regular rhythm.  Exam reveals no gallop and no friction rub.   No murmur heard. Pulmonary/Chest: Effort normal and breath sounds  normal. No respiratory distress. He has no wheezes. He has no rales.  Abdominal: Soft. Bowel sounds are normal. He exhibits no distension. There is no tenderness. There is no rebound.  Genitourinary:     Musculoskeletal: Normal range of motion.  Neurological: He is alert and oriented to person,  place, and time.  Skin: Skin is warm and dry. No rash noted.  Psychiatric: He has a normal mood and affect. His behavior is normal.    ED Course  Procedures (including critical care time) Labs Review Labs Reviewed  URINALYSIS, ROUTINE W REFLEX MICROSCOPIC - Abnormal; Notable for the following:    Leukocytes, UA MODERATE (*)    All other components within normal limits  BASIC METABOLIC PANEL - Abnormal; Notable for the following:    Glucose, Bld 169 (*)    All other components within normal limits  CBC WITH DIFFERENTIAL - Abnormal; Notable for the following:    WBC 12.1 (*)    Hemoglobin 12.2 (*)    HCT 33.4 (*)    MCV 76.4 (*)    MCHC 36.5 (*)    Neutro Abs 8.9 (*)    All other components within normal limits  RETICULOCYTES - Abnormal; Notable for the following:    Retic Ct Pct 3.9 (*)    All other components within normal limits  URINE CULTURE  URINE MICROSCOPIC-ADD ON    Imaging Review Kore[place] Scrotum  06/04/2014   CLINICAL DATA:  Left testicular pain and swelling  EXAM: SCROTAL ULTRASOUND  DOPPLER ULTRASOUND OF THE TESTICLES  TECHNIQUE: Complete ultrasound examination of the testicles, epididymis, and other scrotal structures was performed. Color and spectral Doppler ultrasound were also utilized to evaluate blood flow to the testicles.  COMPARISON:  None.  FINDINGS: Right testicle  Measurements: 4.0 x 2.5 x 1.8 cm. No mass or microlithiasis visualized.  Left testicle  Measurements: 4.1 x 2.7 x 1.9 cm. No mass or microlithiasis visualized.  Right epididymis:  Normal in size and appearance.  Left epididymis:  Normal in size and appearance.  Hydrocele:  None visualized.  Varicocele:  None visualized.  Pulsed Doppler interrogation of both testes demonstrates low resistance arterial and venous waveforms bilaterally.  IMPRESSION: No acute abnormality, intratesticular mass, or sonographic evidence for testicular torsion.   Electronically Signed   By: Christiana PellantGretchen  Green M.D.   On: 06/04/2014  22:02   [place] Art/ven Flow Abd Pelv Doppler  06/04/2014   CLINICAL DATA:  Left testicular pain and swelling  EXAM: SCROTAL ULTRASOUND  DOPPLER ULTRASOUND OF THE TESTICLES  TECHNIQUE: Complete ultrasound examination of the testicles, epididymis, and other scrotal structures was performed. Color and spectral Doppler ultrasound were also utilized to evaluate blood flow to the testicles.  COMPARISON:  None.  FINDINGS: Right testicle  Measurements: 4.0 x 2.5 x 1.8 cm. No mass or microlithiasis visualized.  Left testicle  Measurements: 4.1 x 2.7 x 1.9 cm. No mass or microlithiasis visualized.  Right epididymis:  Normal in size and appearance.  Left epididymis:  Normal in size and appearance.  Hydrocele:  None visualized.  Varicocele:  None visualized.  Pulsed Doppler interrogation of both testes demonstrates low resistance arterial and venous waveforms bilaterally.  IMPRESSION: No acute abnormality, intratesticular mass, or sonographic evidence for testicular torsion.   Electronically Signed   By: Christiana PellantGretchen  Green M.D.   On: 06/04/2014 22:02     EKG Interpretation None      MDM   Final diagnoses:  Testicular pain, left  Testicle pain  Epididymitis  Reassuring ultrasound. Normal anatomy. Normal flow. Clinical exam consistent with left epididymitis Given Cipro. Plan of supportive briefs. Minimize activity. Limited about Vicodin. Primary care follow-up.    Rolland Porter, MD 06/04/14 2221

## 2014-06-05 LAB — URINE CULTURE
Colony Count: NO GROWTH
Culture: NO GROWTH

## 2014-06-12 ENCOUNTER — Telehealth (HOSPITAL_COMMUNITY): Payer: Self-pay | Admitting: Hematology

## 2014-06-12 ENCOUNTER — Emergency Department (HOSPITAL_COMMUNITY)
Admission: EM | Admit: 2014-06-12 | Discharge: 2014-06-12 | Disposition: A | Discharge status: Home / Self Care | Payer: BC Managed Care – PPO | Attending: Emergency Medicine | Admitting: Emergency Medicine

## 2014-06-12 ENCOUNTER — Encounter (HOSPITAL_COMMUNITY): Payer: Self-pay | Admitting: Emergency Medicine

## 2014-06-12 DIAGNOSIS — Z862 Personal history of diseases of the blood and blood-forming organs and certain disorders involving the immune mechanism: Secondary | ICD-10-CM | POA: Insufficient documentation

## 2014-06-12 DIAGNOSIS — Z87891 Personal history of nicotine dependence: Secondary | ICD-10-CM | POA: Insufficient documentation

## 2014-06-12 DIAGNOSIS — N508 Other specified disorders of male genital organs: Secondary | ICD-10-CM | POA: Diagnosis not present

## 2014-06-12 DIAGNOSIS — Z79899 Other long term (current) drug therapy: Secondary | ICD-10-CM | POA: Insufficient documentation

## 2014-06-12 DIAGNOSIS — Z792 Long term (current) use of antibiotics: Secondary | ICD-10-CM | POA: Diagnosis not present

## 2014-06-12 DIAGNOSIS — N50819 Testicular pain, unspecified: Secondary | ICD-10-CM

## 2014-06-12 LAB — COMPREHENSIVE METABOLIC PANEL
ALT: 23 U/L (ref 0–53)
AST: 33 U/L (ref 0–37)
Albumin: 4.5 g/dL (ref 3.5–5.2)
Alkaline Phosphatase: 122 U/L — ABNORMAL HIGH (ref 39–117)
Anion gap: 9 (ref 5–15)
BUN: 7 mg/dL (ref 6–23)
CALCIUM: 9.4 mg/dL (ref 8.4–10.5)
CO2: 24 mmol/L (ref 19–32)
Chloride: 107 mEq/L (ref 96–112)
Creatinine, Ser: 0.87 mg/dL (ref 0.50–1.35)
GFR calc Af Amer: >90 mL/min (ref >90)
GFR calc non Af Amer: >90 mL/min (ref >90)
Glucose, Bld: 92 mg/dL (ref 70–99)
Potassium: 4 mmol/L (ref 3.5–5.1)
SODIUM: 140 mmol/L (ref 135–145)
Total Bilirubin: 1.6 mg/dL — ABNORMAL HIGH (ref 0.3–1.2)
Total Protein: 8.1 g/dL (ref 6.0–8.3)

## 2014-06-12 LAB — CBC WITH DIFFERENTIAL/PLATELET
BASOS ABS: 0 10*3/uL (ref 0.0–0.1)
Basophils Relative: 0 % (ref 0–1)
EOS ABS: 0.2 10*3/uL (ref 0.0–0.7)
Eosinophils Relative: 2 % (ref 0–5)
HCT: 32.9 % — ABNORMAL LOW (ref 39.0–52.0)
Hemoglobin: 11.5 g/dL — ABNORMAL LOW (ref 13.0–17.0)
LYMPHS PCT: 23 % (ref 12–46)
Lymphs Abs: 2 10*3/uL (ref 0.7–4.0)
MCH: 26.9 pg (ref 26.0–34.0)
MCHC: 35 g/dL (ref 30.0–36.0)
MCV: 77 fL — ABNORMAL LOW (ref 78.0–100.0)
Monocytes Absolute: 1.6 10*3/uL — ABNORMAL HIGH (ref 0.1–1.0)
Monocytes Relative: 18 % — ABNORMAL HIGH (ref 3–12)
Neutro Abs: 4.7 10*3/uL (ref 1.7–7.7)
Neutrophils Relative %: 57 % (ref 43–77)
PLATELETS: 124 10*3/uL — AB (ref 150–400)
RBC: 4.27 MIL/uL (ref 4.22–5.81)
RDW: 15.6 % — AB (ref 11.5–15.5)
WBC: 8.5 10*3/uL (ref 4.0–10.5)

## 2014-06-12 LAB — RETICULOCYTES
RBC.: 4.27 MIL/uL (ref 4.22–5.81)
Retic Count, Absolute: 140.9 10*3/uL (ref 19.0–186.0)
Retic Ct Pct: 3.3 % — ABNORMAL HIGH (ref 0.4–3.1)

## 2014-06-12 MED ORDER — CIPROFLOXACIN HCL 500 MG PO TABS: 500.0000 mg | ORAL_TABLET | Freq: Two times a day (BID) | ORAL | Status: DC

## 2014-06-12 MED ORDER — CIPROFLOXACIN HCL 500 MG PO TABS
500.0000 mg | ORAL_TABLET | Freq: Once | ORAL | Status: AC
  Administered 2014-06-12: 500 mg via ORAL
  Filled 2014-06-12: qty 1

## 2014-06-12 MED ORDER — OXYCODONE-ACETAMINOPHEN 5-325 MG PO TABS: 1.0000 | ORAL_TABLET | ORAL | Status: DC | PRN

## 2014-06-12 MED ORDER — OXYCODONE-ACETAMINOPHEN 5-325 MG PO TABS
2.0000 | ORAL_TABLET | Freq: Once | ORAL | Status: AC
  Administered 2014-06-12: 2 via ORAL
  Filled 2014-06-12: qty 2

## 2014-06-12 NOTE — Telephone Encounter (Signed)
Patient C/O pain to right back and feeling like his right testicle is inflamed.  Per patient he was seen last week and diagnosed with Epididymitis.  He states he has completed the antibiotic treatment, but woke up this morning with the same feeling but on the right side.  Patient is unsure if this is a sickle cell crisis.  Explained the day hospital only treats sickle cell pain crisis.  I advised I would notify the physician and see what the plan should be and call him back.  Patient verbalizes understanding.

## 2014-06-12 NOTE — Telephone Encounter (Signed)
Spoke with Dr. Nathanial MillmanAgboola, and she suggests patient go to the ED to rule out any other urologic problems.  Patient verbalizes understanding.

## 2014-06-12 NOTE — ED Notes (Signed)
Pt states that he has been having low back pain that he attributes to sickle cell pain since last week.  States that he was here last week for epididymitis of the left testicle.  Now states that the right testicle is hurting.  No swelling.  Denies NVD>

## 2014-06-12 NOTE — ED Provider Notes (Signed)
CSN: [id number]     Arrival date & time 06/12/14  [id number] History   First MD Initiated Contact with Patient 06/12/14 0920     Chief Complaint  Patient presents with  . Sickle Cell Pain Crisis  . Epididymitis       HPI Patient presents complaining of right testicle pain over the past 7 days.  Was recently seen and diagnosed with left-sided epididymitis.  At that time an ultrasound was performed to demonstrate no testicular torsion and no ultrasonography findings consistent with epididymitis but clinically the patient had a no 4 he was prescribed ciprofloxacin.  He never had antibiotics filled.  He states the left testicle is much better but now is having pain in the right testicle.  His been present for 1 week.  He denies penile discharge.  No fevers or chills.  Denies unilateral testicular swelling.  No penile pain.  Pain is mild to moderate in severity.  He also has a history of sickle cell disease post never had sickle cell crisis pain down in his testicles.  He call his primary care physician today who recommended they come to the ER for evaluation.  Denies nausea vomiting.   Past Medical History  Diagnosis Date  . Sickle cell disease    Past Surgical History  Procedure Laterality Date  . Cholecystectomy     No family history on file. History  Substance Use Topics  . Smoking status: Former Games developermoker  . Smokeless tobacco: Never Used  . Alcohol Use: Yes     Comment: occasionally    Review of Systems  All other systems reviewed and are negative.     Allergies  Shellfish allergy  Home Medications   Prior to Admission medications   Medication Sig Start Date End Date Taking? Authorizing Provider  folic acid (FOLVITE) 800 MCG tablet Take 1 tablet (800 mcg total) by mouth daily. 02/21/14  Yes Massie MaroonLachina M Hollis, FNP  HYDROcodone-acetaminophen (NORCO/VICODIN) 5-325 MG per tablet Take 2 tablets by mouth every 4 (four) hours as needed. Patient taking differently: Take 1 tablet by mouth  every 4 (four) hours as needed for moderate pain.  06/04/14  Yes Rolland PorterMark James, MD  ibuprofen (ADVIL,MOTRIN) 800 MG tablet Take 1 tablet (800 mg total) by mouth every 8 (eight) hours as needed for mild pain or moderate pain. 02/21/14  Yes Massie MaroonLachina M Hollis, FNP  Phenyleph-Doxylamine-DM-APAP (NYQUIL SEVERE COLD/FLU) 5-6.25-10-325 MG/15ML LIQD Take 30 mLs by mouth at bedtime as needed (for cold/sleep).   Yes Historical Provider, MD  ciprofloxacin (CIPRO) 500 MG tablet Take 1 tablet (500 mg total) by mouth every 12 (twelve) hours. 06/04/14   Rolland PorterMark James, MD  HYDROcodone-acetaminophen (NORCO) 5-325 MG per tablet Take 1 tablet by mouth every 6 (six) hours as needed for moderate pain. Patient not taking: Reported on 06/12/2014 05/03/14   Massie MaroonLachina M Hollis, FNP  oxyCODONE-acetaminophen (PERCOCET/ROXICET) 5-325 MG per tablet Take 1 tablet by mouth every 6 (six) hours as needed for moderate pain or severe pain. Patient not taking: Reported on 06/04/2014 05/11/14   Tilden FossaElizabeth Rees, MD   BP 128/74 mmHg  Pulse 98  Temp(Src) 98.2 F (36.8 C) (Oral)  Resp 18  SpO2 100% Physical Exam  Constitutional: He is oriented to person, place, and time. He appears well-developed and well-nourished.  HENT:  Head: Normocephalic and atraumatic.  Eyes: EOM are normal.  Neck: Normal range of motion.  Cardiovascular: Normal rate, regular rhythm, normal heart sounds and intact distal pulses.   Pulmonary/Chest: Effort normal  and breath sounds normal. No respiratory distress.  Abdominal: Soft. He exhibits no distension. There is no tenderness.  Genitourinary:  Normal circumcised penis.  No testicular swelling noted but mild tenderness on the right superior aspect of the testicle.  No scrotal changes.  No penile erythema or swelling.  No crepitus.  Normal lie of both left and right testicles.  Musculoskeletal: Normal range of motion.  Neurological: He is alert and oriented to person, place, and time.  Skin: Skin is warm and dry.   Psychiatric: He has a normal mood and affect. Judgment normal.  Nursing note and vitals reviewed.   ED Course  Procedures (including critical care time) Labs Review Labs Reviewed  CBC WITH DIFFERENTIAL - Abnormal; Notable for the following:    Hemoglobin 11.5 (*)    HCT 32.9 (*)    MCV 77.0 (*)    RDW 15.6 (*)    Platelets 124 (*)    Monocytes Relative 18 (*)    Monocytes Absolute 1.6 (*)    All other components within normal limits  COMPREHENSIVE METABOLIC PANEL - Abnormal; Notable for the following:    Alkaline Phosphatase 122 (*)    Total Bilirubin 1.6 (*)    All other components within normal limits  RETICULOCYTES - Abnormal; Notable for the following:    Retic Ct Pct 3.3 (*)    All other components within normal limits    Imaging Review No results found.   EKG Interpretation None      MDM   Final diagnoses:  None    Unclear etiology of testicular pain.  Patient never completed a course of ciprofloxacin.  This will be represcribed.  Home with a short course of pain medicine.  No indication for ultrasonography today.  Doubt testicular torsion.  Primary care and urology follow-up.    Lyanne CoKevin M Ranika Mcniel, MD 06/12/14 [phone]

## 2014-07-16 ENCOUNTER — Telehealth (HOSPITAL_COMMUNITY): Payer: Self-pay | Admitting: *Deleted

## 2014-07-16 ENCOUNTER — Encounter (HOSPITAL_COMMUNITY): Payer: Self-pay | Admitting: Emergency Medicine

## 2014-07-16 ENCOUNTER — Non-Acute Institutional Stay (HOSPITAL_COMMUNITY)
Admission: AD | Admit: 2014-07-16 | Discharge: 2014-07-16 | Disposition: A | Discharge status: Home / Self Care | Payer: BLUE CROSS/BLUE SHIELD | Source: Ambulatory Visit | Attending: Internal Medicine | Admitting: Internal Medicine

## 2014-07-16 ENCOUNTER — Emergency Department (HOSPITAL_COMMUNITY)
Admission: EM | Admit: 2014-07-16 | Discharge: 2014-07-16 | Disposition: A | Discharge status: Home / Self Care | Payer: BLUE CROSS/BLUE SHIELD | Attending: Emergency Medicine | Admitting: Emergency Medicine

## 2014-07-16 ENCOUNTER — Other Ambulatory Visit: Payer: Self-pay | Admitting: Internal Medicine

## 2014-07-16 DIAGNOSIS — Z87891 Personal history of nicotine dependence: Secondary | ICD-10-CM | POA: Insufficient documentation

## 2014-07-16 DIAGNOSIS — Z91013 Allergy to seafood: Secondary | ICD-10-CM | POA: Insufficient documentation

## 2014-07-16 DIAGNOSIS — E876 Hypokalemia: Secondary | ICD-10-CM | POA: Insufficient documentation

## 2014-07-16 DIAGNOSIS — D5700 Hb-SS disease with crisis, unspecified: Secondary | ICD-10-CM

## 2014-07-16 DIAGNOSIS — D57219 Sickle-cell/Hb-C disease with crisis, unspecified: Secondary | ICD-10-CM | POA: Insufficient documentation

## 2014-07-16 LAB — COMPREHENSIVE METABOLIC PANEL
ALK PHOS: 103 U/L (ref 39–117)
ALT: 19 U/L (ref 0–53)
AST: 34 U/L (ref 0–37)
Albumin: 4.5 g/dL (ref 3.5–5.2)
Anion gap: 6 (ref 5–15)
BUN: 9 mg/dL (ref 6–23)
CO2: 26 mmol/L (ref 19–32)
CREATININE: 0.96 mg/dL (ref 0.50–1.35)
Calcium: 9 mg/dL (ref 8.4–10.5)
Chloride: 109 mmol/L (ref 96–112)
GFR calc Af Amer: >90 mL/min (ref >90)
GFR calc non Af Amer: >90 mL/min (ref >90)
GLUCOSE: 107 mg/dL — AB (ref 70–99)
POTASSIUM: 3.3 mmol/L — AB (ref 3.5–5.1)
Sodium: 141 mmol/L (ref 135–145)
TOTAL PROTEIN: 8 g/dL (ref 6.0–8.3)
Total Bilirubin: 1.6 mg/dL — ABNORMAL HIGH (ref 0.3–1.2)

## 2014-07-16 LAB — MAGNESIUM: Magnesium: 2.3 mg/dL (ref 1.5–2.5)

## 2014-07-16 LAB — CBC WITH DIFFERENTIAL/PLATELET
Basophils Absolute: 0 10*3/uL (ref 0.0–0.1)
Basophils Relative: 0 % (ref 0–1)
EOS PCT: 4 % (ref 0–5)
Eosinophils Absolute: 0.3 10*3/uL (ref 0.0–0.7)
HCT: 34.9 % — ABNORMAL LOW (ref 39.0–52.0)
HEMOGLOBIN: 12.5 g/dL — AB (ref 13.0–17.0)
Lymphocytes Relative: 22 % (ref 12–46)
Lymphs Abs: 1.7 10*3/uL (ref 0.7–4.0)
MCH: 27.7 pg (ref 26.0–34.0)
MCHC: 35.8 g/dL (ref 30.0–36.0)
MCV: 77.2 fL — ABNORMAL LOW (ref 78.0–100.0)
Monocytes Absolute: 0.6 10*3/uL (ref 0.1–1.0)
Monocytes Relative: 8 % (ref 3–12)
Neutro Abs: 5.2 10*3/uL (ref 1.7–7.7)
Neutrophils Relative %: 66 % (ref 43–77)
Platelets: 105 10*3/uL — ABNORMAL LOW (ref 150–400)
RBC: 4.52 MIL/uL (ref 4.22–5.81)
RDW: 16 % — ABNORMAL HIGH (ref 11.5–15.5)
WBC: 7.8 10*3/uL (ref 4.0–10.5)

## 2014-07-16 LAB — RETICULOCYTES
RBC.: 4.52 MIL/uL (ref 4.22–5.81)
RETIC COUNT ABSOLUTE: 144.6 10*3/uL (ref 19.0–186.0)
Retic Ct Pct: 3.2 % — ABNORMAL HIGH (ref 0.4–3.1)

## 2014-07-16 MED ORDER — HYDROMORPHONE HCL 2 MG/ML IJ SOLN
2.0000 mg | INTRAMUSCULAR | Status: DC | PRN
  Filled 2014-07-16: qty 1

## 2014-07-16 MED ORDER — ONDANSETRON HCL 4 MG/2ML IJ SOLN
4.0000 mg | Freq: Once | INTRAMUSCULAR | Status: AC
  Administered 2014-07-16: 4 mg via INTRAVENOUS
  Filled 2014-07-16: qty 2

## 2014-07-16 MED ORDER — HYDROCODONE-ACETAMINOPHEN 5-325 MG PO TABS: 1.0000 | ORAL_TABLET | ORAL | Status: DC | PRN

## 2014-07-16 MED ORDER — SODIUM CHLORIDE 0.9 % IV SOLN
25.0000 mg | INTRAVENOUS | Status: DC | PRN
  Filled 2014-07-16: qty 0.5

## 2014-07-16 MED ORDER — NALOXONE HCL 0.4 MG/ML IJ SOLN: 0.4000 mg | INTRAMUSCULAR | Status: DC | PRN

## 2014-07-16 MED ORDER — DEXTROSE-NACL 5-0.45 % IV SOLN
INTRAVENOUS | Status: DC
  Administered 2014-07-16: 10:00:00 via INTRAVENOUS

## 2014-07-16 MED ORDER — HYDROMORPHONE 2 MG/ML HIGH CONCENTRATION IV PCA SOLN
INTRAVENOUS | Status: DC
  Administered 2014-07-16: 13:00:00 via INTRAVENOUS
  Filled 2014-07-16: qty 25

## 2014-07-16 MED ORDER — HYDROMORPHONE HCL 2 MG/ML IJ SOLN: 2.5000 mg | Freq: Once | INTRAMUSCULAR | Status: DC

## 2014-07-16 MED ORDER — HYDROMORPHONE HCL 2 MG/ML IJ SOLN: 3.0000 mg | Freq: Once | INTRAMUSCULAR | Status: DC

## 2014-07-16 MED ORDER — HYDROMORPHONE HCL 2 MG/ML IJ SOLN
2.0000 mg | Freq: Once | INTRAMUSCULAR | Status: AC
  Administered 2014-07-16: 2 mg via INTRAVENOUS

## 2014-07-16 MED ORDER — DEXTROSE-NACL 5-0.45 % IV SOLN
INTRAVENOUS | Status: DC
  Administered 2014-07-16: 13:00:00 via INTRAVENOUS

## 2014-07-16 MED ORDER — HYDROMORPHONE HCL 2 MG/ML IJ SOLN
2.5000 mg | Freq: Once | INTRAMUSCULAR | Status: AC
  Administered 2014-07-16: 2.5 mg via INTRAVENOUS
  Filled 2014-07-16: qty 2

## 2014-07-16 MED ORDER — HYDROMORPHONE HCL 2 MG/ML IJ SOLN
3.0000 mg | Freq: Once | INTRAMUSCULAR | Status: AC
  Administered 2014-07-16: 3 mg via INTRAVENOUS
  Filled 2014-07-16: qty 2

## 2014-07-16 MED ORDER — DIPHENHYDRAMINE HCL 25 MG PO CAPS: 25.0000 mg | ORAL_CAPSULE | ORAL | Status: DC | PRN

## 2014-07-16 MED ORDER — POTASSIUM CHLORIDE CRYS ER 20 MEQ PO TBCR
40.0000 meq | EXTENDED_RELEASE_TABLET | ORAL | Status: AC
  Administered 2014-07-16 (×2): 40 meq via ORAL
  Filled 2014-07-16 (×2): qty 2

## 2014-07-16 MED ORDER — SODIUM CHLORIDE 0.9 % IV SOLN: 1000.0000 mL | INTRAVENOUS | Status: DC

## 2014-07-16 MED ORDER — SODIUM CHLORIDE 0.9 % IV BOLUS (SEPSIS): 1000.0000 mL | Freq: Once | INTRAVENOUS | Status: DC

## 2014-07-16 MED ORDER — HYDROCODONE-ACETAMINOPHEN 5-325 MG PO TABS
1.0000 | ORAL_TABLET | Freq: Once | ORAL | Status: AC
  Administered 2014-07-16: 1 via ORAL
  Filled 2014-07-16: qty 1

## 2014-07-16 MED ORDER — KETOROLAC TROMETHAMINE 30 MG/ML IJ SOLN
30.0000 mg | Freq: Once | INTRAMUSCULAR | Status: AC
  Administered 2014-07-16: 30 mg via INTRAVENOUS
  Filled 2014-07-16: qty 1

## 2014-07-16 MED ORDER — ONDANSETRON HCL 4 MG/2ML IJ SOLN: 4.0000 mg | Freq: Four times a day (QID) | INTRAMUSCULAR | Status: DC | PRN

## 2014-07-16 MED ORDER — SODIUM CHLORIDE 0.9 % IJ SOLN: 9.0000 mL | INTRAMUSCULAR | Status: DC | PRN

## 2014-07-16 MED ORDER — IBUPROFEN 800 MG PO TABS: 800.0000 mg | ORAL_TABLET | Freq: Three times a day (TID) | ORAL | Status: DC | PRN

## 2014-07-16 MED ORDER — SODIUM CHLORIDE 0.9 % IV SOLN
1000.0000 mL | Freq: Once | INTRAVENOUS | Status: AC
  Administered 2014-07-16: 1000 mL via INTRAVENOUS

## 2014-07-16 NOTE — Progress Notes (Signed)
Texted MD. MD responded - visit patient within an hour of oral medication.

## 2014-07-16 NOTE — ED Provider Notes (Signed)
CSN: [id number]     Arrival date & time 07/16/14  0813 History   First MD Initiated Contact with Patient 07/16/14 [phone]     Chief Complaint  Patient presents with  . Sickle Cell Pain Crisis   HPI  Patient is a 24 year old male with past medical history of sickle cell disease who presents to the emergency room for evaluation of pain in his low back and lateral legs which started this morning approximately 3 hours ago. Patient states the pain is achy and constant. He has tried hydrocodone and ibuprofen at home with no relief. He states that this is consistent with his usual sickle cell pain. Although pain varies in location. He denies any chest pain or shortness of breath. Patient states that his last sickle cell crisis was approximately a month ago. Patient is followed by Dr. Ashley Royalty in the clinic.  Past Medical History  Diagnosis Date  . Sickle cell disease    Past Surgical History  Procedure Laterality Date  . Cholecystectomy     No family history on file. History  Substance Use Topics  . Smoking status: Former Games developer  . Smokeless tobacco: Never Used  . Alcohol Use: Yes     Comment: occasionally    Review of Systems  Constitutional: Negative for fever, chills and fatigue.  Respiratory: Negative for cough, chest tightness and shortness of breath.   Cardiovascular: Negative for chest pain and palpitations.  Gastrointestinal: Negative for nausea, vomiting, abdominal pain, diarrhea, constipation and blood in stool.  Genitourinary: Negative for dysuria, urgency, frequency, hematuria, discharge and difficulty urinating.  Musculoskeletal: Positive for myalgias, back pain, arthralgias and gait problem. Negative for joint swelling, neck pain and neck stiffness.  Skin: Negative for rash.  All other systems reviewed and are negative.     Allergies  Shellfish allergy  Home Medications   Prior to Admission medications   Medication Sig Start Date End Date Taking? Authorizing Provider   folic acid (FOLVITE) 800 MCG tablet Take 1 tablet (800 mcg total) by mouth daily. 02/21/14  Yes Massie Maroon, FNP  HYDROcodone-acetaminophen (NORCO/VICODIN) 5-325 MG per tablet Take 2 tablets by mouth every 4 (four) hours as needed. Patient taking differently: Take 1 tablet by mouth every 4 (four) hours as needed for moderate pain.  06/04/14  Yes Rolland Porter, MD  ibuprofen (ADVIL,MOTRIN) 800 MG tablet Take 1 tablet (800 mg total) by mouth every 8 (eight) hours as needed for mild pain or moderate pain. 02/21/14  Yes Massie Maroon, FNP  ciprofloxacin (CIPRO) 500 MG tablet Take 1 tablet (500 mg total) by mouth every 12 (twelve) hours. Patient not taking: Reported on 07/16/2014 06/12/14   Lyanne Co, MD  HYDROcodone-acetaminophen [facility]) 5-325 MG per tablet Take 1 tablet by mouth every 6 (six) hours as needed for moderate pain. Patient not taking: Reported on 06/12/2014 05/03/14   Massie Maroon, FNP  oxyCODONE-acetaminophen (PERCOCET/ROXICET) 5-325 MG per tablet Take 1 tablet by mouth every 4 (four) hours as needed for severe pain. Patient not taking: Reported on 07/16/2014 06/12/14   Lyanne Co, MD   BP 96/57 mmHg  Pulse 89  Temp(Src) 98.1 F (36.7 C) (Oral)  Resp 18  SpO2 98% Physical Exam  Constitutional: He is oriented to person, place, and time. He appears well-developed and well-nourished. No distress.  HENT:  Head: Normocephalic and atraumatic.  Mouth/Throat: Oropharynx is clear and moist. No oropharyngeal exudate.  Eyes: Conjunctivae and EOM are normal. Pupils are equal, round, and reactive  to light. No scleral icterus.  Neck: Normal range of motion. Neck supple. No JVD present. No thyromegaly present.  Cardiovascular: Normal rate, regular rhythm, normal heart sounds and intact distal pulses.  Exam reveals no gallop and no friction rub.   No murmur heard. Pulmonary/Chest: Effort normal and breath sounds normal. No respiratory distress. He has no wheezes. He has no rales. He  exhibits no tenderness.  Abdominal: Soft. Bowel sounds are normal. He exhibits no distension and no mass. There is no tenderness. There is no rebound and no guarding.  Musculoskeletal: Normal range of motion.  Patient has diffuse soft tissue tenderness palpation of his bilateral lumbar paraspinal muscles and bilateral quadriceps and hamstrings. Patient has full range of motion of the spine and bilateral legs.  Lymphadenopathy:    He has no cervical adenopathy.  Neurological: He is alert and oriented to person, place, and time. He has normal strength. No cranial nerve deficit or sensory deficit. Coordination normal.  Skin: Skin is warm and dry. He is not diaphoretic.  Psychiatric: He has a normal mood and affect. His behavior is normal. Judgment and thought content normal.  Nursing note and vitals reviewed.   ED Course  Procedures (including critical care time) Labs Review Labs Reviewed  CBC WITH DIFFERENTIAL/PLATELET - Abnormal; Notable for the following:    Hemoglobin 12.5 (*)    HCT 34.9 (*)    MCV 77.2 (*)    RDW 16.0 (*)    Platelets 105 (*)    All other components within normal limits  RETICULOCYTES - Abnormal; Notable for the following:    Retic Ct Pct 3.2 (*)    All other components within normal limits  COMPREHENSIVE METABOLIC PANEL - Abnormal; Notable for the following:    Potassium 3.3 (*)    Glucose, Bld 107 (*)    Total Bilirubin 1.6 (*)    All other components within normal limits  URINALYSIS, ROUTINE W REFLEX MICROSCOPIC  CBC WITH DIFFERENTIAL/PLATELET  MAGNESIUM    Imaging Review No results found.   EKG Interpretation None      MDM   Final diagnoses:  Sickle cell crisis   Patient is a 24 year old male who presents emergency room for evaluation of sickle cell pain in his low back and bilateral lower legs. Physical exam is unremarkable. Patient is hemodynamically stable at this time. CBC revealed stable hemoglobin. Reticulocyte count is not elevated. UA,  CMP are currently pending.patient given 3 doses of Dilaudid here with minimal relief. Patient also started on a D5 half-normal saline drip. I spoken with Dr. Ashley RoyaltyMatthews from the sickle cell center who will accept the patient in an outpatient transfer to the sickle cell center. She will assume care at this time. Patient seen by and discussed with Dr. Gwendolyn GrantWalden who agrees to the above workup and plan. Patient to be discharged from the emergency room and wheeled over to the sickle cell center.   Eben Burowourtney A Forcucci, PA-C 07/16/14 1043  Elwin MochaBlair Walden, MD 07/17/14 [phone]

## 2014-07-16 NOTE — ED Notes (Signed)
EMS reports Sickle Cell Pain Crisis starting at 0500. Complaint of lower back pain and leg pain unrelieved by hydrocodone.

## 2014-07-16 NOTE — ED Notes (Signed)
Consult/MD from sickle cell medicine at bedside.

## 2014-07-16 NOTE — ED Notes (Signed)
Bed: [id number] Expected date:  Expected time:  Means of arrival:  Comments: Ems- sickle cell pain crisis

## 2014-07-16 NOTE — Progress Notes (Signed)
Patient alert and oriented. Patient IV removed intact. Pain currently 5 out of 10. Patient transported to family.

## 2014-07-16 NOTE — Telephone Encounter (Signed)
[facility] stating he needed to be seen for Sickle Cell Crisis. Patient complaints of back and legs pain rating at a 9 of 10. Informed patient that MD would be notified.

## 2014-07-16 NOTE — Progress Notes (Signed)
Patient given oral medication per order. PCA encouraged. Patient requesting notification to physician to be discharged.

## 2014-07-16 NOTE — H&P (Signed)
[facility] History and Physical  William Jennings:[id number] DOB: [birth year] DOA: 07/16/2014   PCP: Annabeth Tortora A., MD   Chief Complaint: Pain in back and legs x 4 days  HPI: Pt with HB [place] who is well known to me presents with c/o pain in back and legs x 4 days which has worsened despite use of oral analgesics. Pt rates pain at an initial intensity of 10/10 which has decreased to 8/10 after 3 doses of IV dilaudid. He describes pain as throbbing and sharp in nature and non-radiating. He has not received any Toradol or anti-inflammatory medications. He denies any associated symptoms. However the pain is such that it has impaired his ambulation.  Review of Systems:  Constitutional: No weight loss, night sweats, Fevers, chills, fatigue.  HEENT: No headaches, dizziness, seizures, vision changes, difficulty swallowing,Tooth/dental problems,Sore throat, No sneezing, itching, ear ache, nasal congestion, post nasal drip,  Cardio-vascular: No chest pain, Orthopnea, PND, swelling in lower extremities, anasarca, dizziness, palpitations  GI: No heartburn, indigestion, abdominal pain, nausea, vomiting, diarrhea, change in bowel habits, loss of appetite  Resp: No shortness of breath with exertion or at rest. No excess mucus, no productive cough, No non-productive cough, No coughing up of blood.No change in color of mucus.No wheezing.No chest wall deformity  Skin: no rash or lesions.  GU: no dysuria, change in color of urine, no urgency or frequency. No flank pain.  Psych: No change in mood or affect. No depression or anxiety. No memory loss.    Past Medical History  Diagnosis Date  . Sickle cell disease    Past Surgical History  Procedure Laterality Date  . Cholecystectomy     Social History:  reports that he has quit smoking. He has never used smokeless tobacco. He reports that he drinks alcohol. He reports that he does not use illicit drugs.  Allergies  Allergen Reactions   . Shellfish Allergy Anaphylaxis    No family history on file.  Prior to Admission medications   Medication Sig Start Date End Date Taking? Authorizing Provider  ciprofloxacin (CIPRO) 500 MG tablet Take 1 tablet (500 mg total) by mouth every 12 (twelve) hours. Patient not taking: Reported on 07/16/2014 06/12/14   Lyanne Co, MD  folic acid (FOLVITE) 800 MCG tablet Take 1 tablet (800 mcg total) by mouth daily. 02/21/14   Massie Maroon, FNP  HYDROcodone-acetaminophen (NORCO) 5-325 MG per tablet Take 1 tablet by mouth every 6 (six) hours as needed for moderate pain. Patient not taking: Reported on 06/12/2014 05/03/14   Massie Maroon, FNP  HYDROcodone-acetaminophen (NORCO/VICODIN) 5-325 MG per tablet Take 2 tablets by mouth every 4 (four) hours as needed. Patient taking differently: Take 1 tablet by mouth every 4 (four) hours as needed for moderate pain.  06/04/14   Rolland Porter, MD  ibuprofen (ADVIL,MOTRIN) 800 MG tablet Take 1 tablet (800 mg total) by mouth every 8 (eight) hours as needed for mild pain or moderate pain. 02/21/14   Massie Maroon, FNP  oxyCODONE-acetaminophen (PERCOCET/ROXICET) 5-325 MG per tablet Take 1 tablet by mouth every 4 (four) hours as needed for severe pain. Patient not taking: Reported on 07/16/2014 06/12/14   Lyanne Co, MD   Physical Exam: There were no vitals filed for this visit. General: Alert, awake, oriented x3, in no acute distress.  Vital Signs: T 98.1, HR100, BP 99/68, RR18, Sat 97% RA HEENT: [place]/AT PEERL, EOMI, ainicteric Neck: Trachea midline,  no masses, no thyromegal,y no JVD,  no carotid bruit OROPHARYNX:  Moist, No exudate/ erythema/lesions.  Heart: Regular rate and rhythm, without murmurs, rubs, gallops, PMI non-displaced, no heaves or thrills on palpation.  Lungs: Clear to auscultation, no wheezing or rhonchi noted. No increased vocal fremitus resonant to percussion  Abdomen: Soft, nontender, nondistended, positive bowel sounds, no masses no  hepatosplenomegaly noted..  Neuro: No focal neurological deficits noted cranial nerves II through XII grossly intact.   Labs on Admission:   Basic Metabolic Panel:  Recent Labs Lab 07/16/14 0857  NA 141  K 3.3*  CL 109  CO2 26  GLUCOSE 107*  BUN 9  CREATININE 0.96  CALCIUM 9.0  MG 2.3   Liver Function Tests:  Recent Labs Lab 07/16/14 0857  AST 34  ALT 19  ALKPHOS 103  BILITOT 1.6*  PROT 8.0  ALBUMIN 4.5   CBC:  Recent Labs Lab 07/16/14 0857  WBC 7.8  NEUTROABS 5.2  HGB 12.5*  HCT 34.9*  MCV 77.2*  PLT 105*   BNP: Invalid input(s): POCBNP     Assessment/Plan: Active Problems:   Sickle-cell/Hb-C disease with crisis: Pt will be treated  weight based Dilaudid PCA, IVF and Toradol. Patient will be re-evaluated for pain in the context of function and relationship to baseline as care progresses. However given the initial response to rapid re-dosing and dose of Toradol received in the ED, I anticipate that he will be discharged home.  Hypokalemia:  Pt is mildly hypokalemic. Will replace orally   Time spend: 35 minutes Code Status: Full Code Family Communication: N/A Disposition Plan:  Likely Home  Zanyia Silbaugh A., MD  Pager [phone]  If 7PM-7AM, please contact night-coverage [email or web address] Password [email or web address] 07/16/2014, 11:35 AM

## 2014-07-16 NOTE — Discharge Instructions (Signed)
Sickle Cell Anemia, Adult °Sickle cell anemia is a condition in which red blood cells have an abnormal "sickle" shape. This abnormal shape shortens the cells' life span, which results in a lower than normal concentration of red blood cells in the blood. The sickle shape also causes the cells to clump together and block free blood flow through the blood vessels. As a result, the tissues and organs of the body do not receive enough oxygen. Sickle cell anemia causes organ damage and pain and increases the risk of infection. °CAUSES  °Sickle cell anemia is a genetic disorder. Those who receive two copies of the gene have the condition, and those who receive one copy have the trait. °RISK FACTORS °The sickle cell gene is most common in people whose families originated in [place]. Other areas of the globe where sickle cell trait occurs include the Mediterranean, [place] and [place], the [place], and the [place].  °SIGNS AND SYMPTOMS °· Pain, especially in the extremities, back, chest, or abdomen (common). The pain may start suddenly or may develop following an illness, especially if there is dehydration. Pain can also occur due to overexertion or exposure to extreme temperature changes. °· Frequent severe bacterial infections, especially certain types of pneumonia and meningitis. °· Pain and swelling in the hands and feet. °· Decreased activity.   °· Loss of appetite.   °· Change in behavior. °· Headaches. °· Seizures. °· Shortness of breath or difficulty breathing. °· Vision changes. °· Skin ulcers. °Those with the trait may not have symptoms or they may have mild symptoms.  °DIAGNOSIS  °Sickle cell anemia is diagnosed with blood tests that demonstrate the genetic trait. It is often diagnosed during the newborn period, due to mandatory testing nationwide. A variety of blood tests, X-rays, CT scans, MRI scans, ultrasounds, and lung function tests may also be done to monitor the condition. °TREATMENT  °Sickle  cell anemia may be treated with: °· Medicines. You may be given pain medicines, antibiotic medicines (to treat and prevent infections) or medicines to increase the production of certain types of hemoglobin. °· Fluids. °· Oxygen. °· Blood transfusions. °HOME CARE INSTRUCTIONS  °· Drink enough fluid to keep your urine clear or pale yellow. Increase your fluid intake in hot weather and during exercise. °· Do not smoke. Smoking lowers oxygen levels in the blood.   °· Only take over-the-counter or prescription medicines for pain, fever, or discomfort as directed by your health care provider. °· Take antibiotics as directed by your health care provider. Make sure you finish them it even if you start to feel better.   °· Take supplements as directed by your health care provider.   °· Consider wearing a medical alert bracelet. This tells anyone caring for you in an emergency of your condition.   °· When traveling, keep your medical information, health care provider's names, and the medicines you take with you at all times.   °· If you develop a fever, do not take medicines to reduce the fever right away. This could cover up a problem that is developing. Notify your health care provider. °· Keep all follow-up appointments with your health care provider. Sickle cell anemia requires regular medical care. °SEEK MEDICAL CARE IF: ° You have a fever. °SEEK IMMEDIATE MEDICAL CARE IF:  °· You feel dizzy or faint.   °· You have new abdominal pain, especially on the left side near the stomach area.   °· You develop a persistent, often uncomfortable and painful penile erection (priapism). If this is not treated immediately it   will lead to impotence.   °· You have numbness your arms or legs or you have a hard time moving them.   °· You have a hard time with speech.   °· You have a fever or persistent symptoms for more than 2-3 days.   °· You have a fever and your symptoms suddenly get worse.   °· You have signs or symptoms of infection.  These include:   °¨ Chills.   °¨ Abnormal tiredness (lethargy).   °¨ Irritability.   °¨ Poor eating.   °¨ Vomiting.   °· You develop pain that is not helped with medicine.   °· You develop shortness of breath. °· You have pain in your chest.   °· You are coughing up pus-like or bloody sputum.   °· You develop a stiff neck. °· Your feet or hands swell or have pain. °· Your abdomen appears bloated. °· You develop joint pain. °MAKE SURE YOU: °· Understand these instructions. °Document Released: 09/08/2005 Document Revised: 10/15/2013 Document Reviewed: 01/10/2013 °ExitCare® Patient Information ©2015 [employer]. This information is not intended to replace advice given to you by your health care provider. Make sure you discuss any questions you have with your health care provider. ° °

## 2014-07-16 NOTE — Discharge Summary (Signed)
Physician Discharge Summary  SANDIP POWER ZOX:[id number] DOB: 28-Jan-1991 DOA: 07/16/2014  PCP: Altha Harm., MD  Admit date: 07/16/2014 Discharge date: 07/16/2014  Discharge Diagnoses:  Active Problems:   Sickle-cell/Hb-C disease with crisis   Discharge Condition: Good  Disposition: Home  Follow-up Information    Follow up with Leylah Tarnow A., MD In 1 week.   Specialty:  Internal Medicine   Contact information:   [address] [place] [address] [phone]       Diet: Regular diet   Wt Readings from Last 3 Encounters:  06/04/14 140 lb (63.504 kg)  05/11/14 145 lb (65.772 kg)  11/16/13 146 lb (66.225 kg)    History of present illness:  Pt with HB [place] who is well known to me presents with c/o pain in back and legs x 4 days which has worsened despite use of oral analgesics. Pt rates pain at an initial intensity of 10/10 which has decreased to 8/10 after 3 doses of IV dilaudid. He describes pain as throbbing and sharp in nature and non-radiating. He has not received any Toradol or anti-inflammatory medications. He denies any associated symptoms. However the pain is such that it has impaired his ambulation.  Hospital Course:  Pt will be treated with initially treated in the ED with rapid re-dosing of Dilaudid,  IVF and Toradol . The pain remained above 7/10 and Pt was transitioned to a weight based Dilaudid PCA . Pain decreased to 5/10. Pt was given 1 dose of Percocet 5-325 mg prior to discharge.  Discharge Exam:  Filed Vitals:   07/16/14 1700  BP: 112/74  Pulse: 83  Temp:   Resp:    Filed Vitals:   07/16/14 1350 07/16/14 1448 07/16/14 1548 07/16/14 1700  BP: 117/70 115/72 120/63 112/74  Pulse: 85 80  83  Temp: 97.6 F (36.4 C) 97.7 F (36.5 C)    Resp: SpO2: 98% 98% 100%      General: Alert, awake, oriented x3, in no acute distress.  HEENT: [place]/AT PEERL, EOMI, anicteric. Neck: Trachea midline,  no masses, no thyromegal,y no JVD,  no carotid bruit OROPHARYNX:  Moist, No exudate/ erythema/lesions.  Heart: Regular rate and rhythm, without murmurs, rubs, gallops.  Lungs: Clear to auscultation, no wheezing or rhonchi noted. No increased vocal fremitus resonant to percussion  Abdomen: Soft, nontender, nondistended, positive bowel sounds, no masses no hepatosplenomegaly noted..  Neuro: No focal neurological deficits noted cranial nerves II through XII grossly intact.  Strength normal in bilateral upper and lower extremities. Musculoskeletal: No warm swelling or erythema around joints, no spinal tenderness noted. Psychiatric: Patient alert and oriented x3, good insight and cognition, good recent to remote recall.    Discharge Instructions  Discharge Instructions    Activity as tolerated - No restrictions    Complete by:  As directed      Diet general    Complete by:  As directed             Medication List    STOP taking these medications        ciprofloxacin 500 MG tablet  Commonly known as:  CIPRO     oxyCODONE-acetaminophen 5-325 MG per tablet  Commonly known as:  PERCOCET/ROXICET      TAKE these medications        folic acid 800 MCG tablet  Commonly known as:  FOLVITE  Take 1 tablet (800 mcg total) by mouth daily.     HYDROcodone-acetaminophen  5-325 MG per tablet  Commonly known as:  NORCO  Take 1 tablet by mouth every 4 (four) hours as needed for moderate pain.     ibuprofen 800 MG tablet  Commonly known as:  ADVIL,MOTRIN  Take 1 tablet (800 mg total) by mouth every 8 (eight) hours as needed for mild pain or moderate pain.          The results of significant diagnostics from this hospitalization (including imaging, microbiology, ancillary and laboratory) are listed below for reference.    Significant Diagnostic Studies: No results found.  Microbiology: No results found for this or any previous visit (from the past 240 hour(s)).   Labs: Basic Metabolic Panel:  Recent Labs Lab  07/16/14 0857  NA 141  K 3.3*  CL 109  CO2 26  GLUCOSE 107*  BUN 9  CREATININE 0.96  CALCIUM 9.0  MG 2.3   Liver Function Tests:  Recent Labs Lab 07/16/14 0857  AST 34  ALT 19  ALKPHOS 103  BILITOT 1.6*  PROT 8.0  ALBUMIN 4.5   No results for input(s): LIPASE, AMYLASE in the last 168 hours. No results for input(s): AMMONIA in the last 168 hours. CBC:  Recent Labs Lab 07/16/14 0857  WBC 7.8  NEUTROABS 5.2  HGB 12.5*  HCT 34.9*  MCV 77.2*  PLT 105*    Time coordinating discharge: 30 minutes  Signed:  Da Jadi Deyarmin A.  07/16/2014, 5:05 PM

## 2014-09-11 ENCOUNTER — Telehealth: Payer: Self-pay | Admitting: Internal Medicine

## 2014-09-11 NOTE — Telephone Encounter (Signed)
Received correspondence from [employer] requesting medical records. Forwarded to [employer].  °

## 2014-09-24 ENCOUNTER — Telehealth: Payer: Self-pay | Admitting: Internal Medicine

## 2014-09-24 NOTE — Telephone Encounter (Signed)
Received request for medical records from [employer]. Forwarded to [employer].  °

## 2014-12-10 ENCOUNTER — Telehealth: Payer: Self-pay | Admitting: Family Medicine

## 2014-12-10 NOTE — Telephone Encounter (Signed)
Please call and schedule an appointment patient has not be seen in office x 1 year. Thanks!

## 2015-01-07 ENCOUNTER — Emergency Department (HOSPITAL_BASED_OUTPATIENT_CLINIC_OR_DEPARTMENT_OTHER): Payer: Self-pay

## 2015-01-07 ENCOUNTER — Emergency Department (HOSPITAL_BASED_OUTPATIENT_CLINIC_OR_DEPARTMENT_OTHER)
Admission: EM | Admit: 2015-01-07 | Discharge: 2015-01-07 | Disposition: A | Discharge status: Home / Self Care | Payer: Self-pay | Attending: Emergency Medicine | Admitting: Emergency Medicine

## 2015-01-07 ENCOUNTER — Encounter (HOSPITAL_BASED_OUTPATIENT_CLINIC_OR_DEPARTMENT_OTHER): Payer: Self-pay

## 2015-01-07 DIAGNOSIS — Z79899 Other long term (current) drug therapy: Secondary | ICD-10-CM | POA: Insufficient documentation

## 2015-01-07 DIAGNOSIS — R079 Chest pain, unspecified: Secondary | ICD-10-CM | POA: Insufficient documentation

## 2015-01-07 DIAGNOSIS — D5700 Hb-SS disease with crisis, unspecified: Secondary | ICD-10-CM | POA: Insufficient documentation

## 2015-01-07 LAB — COMPREHENSIVE METABOLIC PANEL
ALT: 16 U/L — ABNORMAL LOW (ref 17–63)
AST: 31 U/L (ref 15–41)
Albumin: 4.6 g/dL (ref 3.5–5.0)
Alkaline Phosphatase: 91 U/L (ref 38–126)
Anion gap: 3 — ABNORMAL LOW (ref 5–15)
BILIRUBIN TOTAL: 1.7 mg/dL — AB (ref 0.3–1.2)
BUN: 7 mg/dL (ref 6–20)
CALCIUM: 9 mg/dL (ref 8.9–10.3)
CO2: 27 mmol/L (ref 22–32)
Chloride: 107 mmol/L (ref 101–111)
Creatinine, Ser: 0.9 mg/dL (ref 0.61–1.24)
GFR calc Af Amer: >60 mL/min (ref >60)
Glucose, Bld: 102 mg/dL — ABNORMAL HIGH (ref 65–99)
Potassium: 3.9 mmol/L (ref 3.5–5.1)
SODIUM: 137 mmol/L (ref 135–145)
TOTAL PROTEIN: 8 g/dL (ref 6.5–8.1)

## 2015-01-07 LAB — RETICULOCYTES
RBC.: 4.57 MIL/uL (ref 4.22–5.81)
Retic Count, Absolute: 228.5 10*3/uL — ABNORMAL HIGH (ref 19.0–186.0)
Retic Ct Pct: 5 % — ABNORMAL HIGH (ref 0.4–3.1)

## 2015-01-07 LAB — CBC WITH DIFFERENTIAL/PLATELET
BASOS ABS: 0 10*3/uL (ref 0.0–0.1)
BASOS PCT: 0 % (ref 0–1)
Eosinophils Absolute: 0.1 10*3/uL (ref 0.0–0.7)
Eosinophils Relative: 1 % (ref 0–5)
HEMATOCRIT: 35.9 % — AB (ref 39.0–52.0)
Hemoglobin: 13.2 g/dL (ref 13.0–17.0)
Lymphocytes Relative: 43 % (ref 12–46)
Lymphs Abs: 3.6 10*3/uL (ref 0.7–4.0)
MCH: 28.3 pg (ref 26.0–34.0)
MCHC: 36.8 g/dL — AB (ref 30.0–36.0)
MCV: 76.9 fL — ABNORMAL LOW (ref 78.0–100.0)
Monocytes Absolute: 1 10*3/uL (ref 0.1–1.0)
Monocytes Relative: 12 % (ref 3–12)
NEUTROS ABS: 3.7 10*3/uL (ref 1.7–7.7)
Neutrophils Relative %: 44 % (ref 43–77)
PLATELETS: 149 10*3/uL — AB (ref 150–400)
RBC: 4.67 MIL/uL (ref 4.22–5.81)
RDW: 14.4 % (ref 11.5–15.5)
WBC: 8.4 10*3/uL (ref 4.0–10.5)

## 2015-01-07 MED ORDER — HYDROCODONE-ACETAMINOPHEN 5-325 MG PO TABS: 2.0000 | ORAL_TABLET | ORAL | Status: DC | PRN

## 2015-01-07 MED ORDER — HYDROMORPHONE HCL 1 MG/ML IJ SOLN
1.0000 mg | INTRAMUSCULAR | Status: AC | PRN
  Administered 2015-01-07 (×4): 1 mg via INTRAVENOUS
  Filled 2015-01-07 (×4): qty 1

## 2015-01-07 MED ORDER — ONDANSETRON HCL 4 MG/2ML IJ SOLN
4.0000 mg | Freq: Once | INTRAMUSCULAR | Status: DC
  Filled 2015-01-07: qty 2

## 2015-01-07 MED ORDER — SODIUM CHLORIDE 0.9 % IV BOLUS (SEPSIS)
1000.0000 mL | Freq: Once | INTRAVENOUS | Status: AC
  Administered 2015-01-07: 1000 mL via INTRAVENOUS

## 2015-01-07 MED ORDER — KETOROLAC TROMETHAMINE 30 MG/ML IJ SOLN
30.0000 mg | Freq: Once | INTRAMUSCULAR | Status: AC
  Administered 2015-01-07: 30 mg via INTRAVENOUS
  Filled 2015-01-07: qty 1

## 2015-01-07 NOTE — Discharge Instructions (Signed)
Sickle Cell Anemia, Adult °Sickle cell anemia is a condition in which red blood cells have an abnormal "sickle" shape. This abnormal shape shortens the cells' life span, which results in a lower than normal concentration of red blood cells in the blood. The sickle shape also causes the cells to clump together and block free blood flow through the blood vessels. As a result, the tissues and organs of the body do not receive enough oxygen. Sickle cell anemia causes organ damage and pain and increases the risk of infection. °CAUSES  °Sickle cell anemia is a genetic disorder. Those who receive two copies of the gene have the condition, and those who receive one copy have the trait. °RISK FACTORS °The sickle cell gene is most common in people whose families originated in [place]. Other areas of the globe where sickle cell trait occurs include the Mediterranean, [place] and [place], the [place], and the [place].  °SIGNS AND SYMPTOMS °· Pain, especially in the extremities, back, chest, or abdomen (common). The pain may start suddenly or may develop following an illness, especially if there is dehydration. Pain can also occur due to overexertion or exposure to extreme temperature changes. °· Frequent severe bacterial infections, especially certain types of pneumonia and meningitis. °· Pain and swelling in the hands and feet. °· Decreased activity.   °· Loss of appetite.   °· Change in behavior. °· Headaches. °· Seizures. °· Shortness of breath or difficulty breathing. °· Vision changes. °· Skin ulcers. °Those with the trait may not have symptoms or they may have mild symptoms.  °DIAGNOSIS  °Sickle cell anemia is diagnosed with blood tests that demonstrate the genetic trait. It is often diagnosed during the newborn period, due to mandatory testing nationwide. A variety of blood tests, X-rays, CT scans, MRI scans, ultrasounds, and lung function tests may also be done to monitor the condition. °TREATMENT  °Sickle  cell anemia may be treated with: °· Medicines. You may be given pain medicines, antibiotic medicines (to treat and prevent infections) or medicines to increase the production of certain types of hemoglobin. °· Fluids. °· Oxygen. °· Blood transfusions. °HOME CARE INSTRUCTIONS  °· Drink enough fluid to keep your urine clear or pale yellow. Increase your fluid intake in hot weather and during exercise. °· Do not smoke. Smoking lowers oxygen levels in the blood.   °· Only take over-the-counter or prescription medicines for pain, fever, or discomfort as directed by your health care provider. °· Take antibiotics as directed by your health care provider. Make sure you finish them it even if you start to feel better.   °· Take supplements as directed by your health care provider.   °· Consider wearing a medical alert bracelet. This tells anyone caring for you in an emergency of your condition.   °· When traveling, keep your medical information, health care provider's names, and the medicines you take with you at all times.   °· If you develop a fever, do not take medicines to reduce the fever right away. This could cover up a problem that is developing. Notify your health care provider. °· Keep all follow-up appointments with your health care provider. Sickle cell anemia requires regular medical care. °SEEK MEDICAL CARE IF: ° You have a fever. °SEEK IMMEDIATE MEDICAL CARE IF:  °· You feel dizzy or faint.   °· You have new abdominal pain, especially on the left side near the stomach area.   °· You develop a persistent, often uncomfortable and painful penile erection (priapism). If this is not treated immediately it   will lead to impotence.   °· You have numbness your arms or legs or you have a hard time moving them.   °· You have a hard time with speech.   °· You have a fever or persistent symptoms for more than 2-3 days.   °· You have a fever and your symptoms suddenly get worse.   °· You have signs or symptoms of infection.  These include:   °¨ Chills.   °¨ Abnormal tiredness (lethargy).   °¨ Irritability.   °¨ Poor eating.   °¨ Vomiting.   °· You develop pain that is not helped with medicine.   °· You develop shortness of breath. °· You have pain in your chest.   °· You are coughing up pus-like or bloody sputum.   °· You develop a stiff neck. °· Your feet or hands swell or have pain. °· Your abdomen appears bloated. °· You develop joint pain. °MAKE SURE YOU: °· Understand these instructions. °Document Released: 09/08/2005 Document Revised: 10/15/2013 Document Reviewed: 01/10/2013 °ExitCare® Patient Information ©2015 [employer]. This information is not intended to replace advice given to you by your health care provider. Make sure you discuss any questions you have with your health care provider. ° °

## 2015-01-07 NOTE — ED Notes (Signed)
Pain to back, ribs, shoulders since 3am-feels is [facility]

## 2015-01-07 NOTE — ED Provider Notes (Addendum)
CSN: [id number]     Arrival date & time 01/07/15  1347 History   First MD Initiated Contact with Patient 01/07/15 1456     Chief Complaint  Patient presents with  . Sickle Cell Pain Crisis      HPI   Patient presents for evaluation Of shoulder, chest, and back pain.  Symptoms since 3:00 this morning. Taking ibuprofen. Typically takes hydrocodone. Follows with Marthann Schiller [facility] the [facility]. Has not had an appointment for a while. States he called for an appointment last week and states he is "not heard back". Out of his medications. Per review of his records has infrequent flares. Denies atypical features now. No shortness of breath fever or sputum or cough. Right shoulder bilateral precordial chest and bilateral lower back paraspinal muscular. Past Medical History  Diagnosis Date  . Sickle cell disease    Past Surgical History  Procedure Laterality Date  . Cholecystectomy     No family history on file. History  Substance Use Topics  . Smoking status: Former Games developer  . Smokeless tobacco: Never Used  . Alcohol Use: Yes     Comment: occasionally    Review of Systems  Constitutional: Negative for fever, chills, diaphoresis, appetite change and fatigue.  HENT: Negative for mouth sores, sore throat and trouble swallowing.   Eyes: Negative for visual disturbance.  Respiratory: Negative for cough, chest tightness, shortness of breath and wheezing.   Cardiovascular: Positive for chest pain.  Gastrointestinal: Negative for nausea, vomiting, abdominal pain, diarrhea and abdominal distention.  Endocrine: Negative for polydipsia, polyphagia and polyuria.  Genitourinary: Negative for dysuria, frequency and hematuria.  Musculoskeletal: Positive for back pain. Negative for gait problem.       Shoulder pain  Skin: Negative for color change, pallor and rash.  Neurological: Negative for dizziness, syncope, light-headedness and headaches.  Hematological: Does not bruise/bleed  easily.  Psychiatric/Behavioral: Negative for behavioral problems and confusion.      Allergies  Shellfish allergy  Home Medications   Prior to Admission medications   Medication Sig Start Date End Date Taking? Authorizing Provider  folic acid (FOLVITE) 800 MCG tablet Take 1 tablet (800 mcg total) by mouth daily. 02/21/14   Massie Maroon, FNP  HYDROcodone-acetaminophen (NORCO/VICODIN) 5-325 MG per tablet Take 2 tablets by mouth every 4 (four) hours as needed. 01/07/15   Rolland Porter, MD  ibuprofen (ADVIL,MOTRIN) 800 MG tablet Take 1 tablet (800 mg total) by mouth every 8 (eight) hours as needed for mild pain or moderate pain. 07/16/14   Altha Harm, MD   BP 97/43 mmHg  Pulse 91  Temp(Src) 98.3 F (36.8 C) (Oral)  Ht 5\' 8"  (1.727 m)  Wt 145 lb (65.772 kg)  BMI 22.05 kg/m2  SpO2 100% Physical Exam  Constitutional: He is oriented to person, place, and time. He appears well-developed and well-nourished. No distress.  HENT:  Head: Normocephalic.  Eyes: Conjunctivae are normal. Pupils are equal, round, and reactive to light. No scleral icterus.  Neck: Normal range of motion. Neck supple. No thyromegaly present.  Cardiovascular: Normal rate and regular rhythm.  Exam reveals no gallop and no friction rub.   No murmur heard. Pulmonary/Chest: Effort normal and breath sounds normal. No respiratory distress. He has no wheezes. He has no rales.    Abdominal: Soft. Bowel sounds are normal. He exhibits no distension. There is no tenderness. There is no rebound.  Musculoskeletal: Normal range of motion.       Back:  Neurological:  He is alert and oriented to person, place, and time.  Skin: Skin is warm and dry. No rash noted.  Psychiatric: He has a normal mood and affect. His behavior is normal.    ED Course  Procedures (including critical care time) Labs Review Labs Reviewed  CBC WITH DIFFERENTIAL/PLATELET - Abnormal; Notable for the following:    HCT 35.9 (*)    MCV 76.9 (*)     MCHC 36.8 (*)    Platelets 149 (*)    All other components within normal limits  COMPREHENSIVE METABOLIC PANEL - Abnormal; Notable for the following:    Glucose, Bld 102 (*)    ALT 16 (*)    Total Bilirubin 1.7 (*)    Anion gap 3 (*)    All other components within normal limits  RETICULOCYTES - Abnormal; Notable for the following:    Retic Ct Pct 5.0 (*)    Retic Count, Manual 228.5 (*)    All other components within normal limits    Imaging Review Dg Chest 2 View  01/07/2015   CLINICAL DATA:  Chest pain.  Sickle cell disease  EXAM: CHEST  2 VIEW  COMPARISON:  August 13, 2013  FINDINGS: Lungs are clear. Heart size and pulmonary vascularity are normal. No adenopathy. There are multiple vertebral body endplate infarcts consistent with sickle cell disease. Sclerosis in both humeral heads is also consistent with bony changes of sickle cell disease. Spleen appears somewhat enlarged. There is mild upper thoracic dextroscoliosis.  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 Lungs clear. No adenopathy. Spleen appears enlarged.   Electronically Signed   By: Bretta Bang III M.D.   On: 01/07/2015 15:33     EKG Interpretation None      MDM   Final diagnoses:  Chest pain  Sickle-cell disease with vaso-occlusive pain    Plan is lab evaluation. Chest x-ray. Analgesia 6 anti-inflammatory is symptom control fluids reevaluation.  Labs are reassuring. No symptoms or findings clinically, radiographically to suggest acute chest. Not febrile or hypoxemic. Symptoms controlled after reasonable amount of IV meds:Dilaudid, Toradol, and fluids. Appropriate for discharge home. Offered him prescription for a 24-hour supply of pain medication and encouraged him to follow-up with Dr. Ashley Royalty.  Rolland Porter, MD 01/07/15 1812  Rolland Porter, MD 01/07/15 [phone]

## 2015-01-07 NOTE — ED Notes (Signed)
Dr. Fayrene Fearing gave VO to redose pt with dilaudid  IV now.

## 2015-01-09 ENCOUNTER — Non-Acute Institutional Stay (HOSPITAL_COMMUNITY)
Admission: AD | Admit: 2015-01-09 | Discharge: 2015-01-09 | Disposition: A | Discharge status: Home / Self Care | Payer: Self-pay | Attending: Internal Medicine | Admitting: Internal Medicine

## 2015-01-09 ENCOUNTER — Telehealth (HOSPITAL_COMMUNITY): Payer: Self-pay | Admitting: Hematology

## 2015-01-09 ENCOUNTER — Encounter (HOSPITAL_COMMUNITY): Payer: Self-pay | Admitting: *Deleted

## 2015-01-09 DIAGNOSIS — Z791 Long term (current) use of non-steroidal anti-inflammatories (NSAID): Secondary | ICD-10-CM | POA: Insufficient documentation

## 2015-01-09 DIAGNOSIS — D5700 Hb-SS disease with crisis, unspecified: Secondary | ICD-10-CM | POA: Diagnosis present

## 2015-01-09 DIAGNOSIS — Z79899 Other long term (current) drug therapy: Secondary | ICD-10-CM | POA: Insufficient documentation

## 2015-01-09 LAB — COMPREHENSIVE METABOLIC PANEL
ALBUMIN: 4.5 g/dL (ref 3.5–5.0)
ALK PHOS: 105 U/L (ref 38–126)
ALT: 16 U/L — ABNORMAL LOW (ref 17–63)
AST: 26 U/L (ref 15–41)
Anion gap: 5 (ref 5–15)
BILIRUBIN TOTAL: 1.2 mg/dL (ref 0.3–1.2)
BUN: 9 mg/dL (ref 6–20)
CHLORIDE: 107 mmol/L (ref 101–111)
CO2: 25 mmol/L (ref 22–32)
Calcium: 8.9 mg/dL (ref 8.9–10.3)
Creatinine, Ser: 0.83 mg/dL (ref 0.61–1.24)
GFR calc Af Amer: >60 mL/min (ref >60)
GFR calc non Af Amer: >60 mL/min (ref >60)
GLUCOSE: 96 mg/dL (ref 65–99)
POTASSIUM: 3.6 mmol/L (ref 3.5–5.1)
SODIUM: 137 mmol/L (ref 135–145)
Total Protein: 7.7 g/dL (ref 6.5–8.1)

## 2015-01-09 LAB — CBC WITH DIFFERENTIAL/PLATELET
Basophils Absolute: 0.1 10*3/uL (ref 0.0–0.1)
Basophils Relative: 1 % (ref 0–1)
Eosinophils Absolute: 0.1 10*3/uL (ref 0.0–0.7)
Eosinophils Relative: 1 % (ref 0–5)
HEMATOCRIT: 32.6 % — AB (ref 39.0–52.0)
HEMOGLOBIN: 11.5 g/dL — AB (ref 13.0–17.0)
Lymphocytes Relative: 30 % (ref 12–46)
Lymphs Abs: 2 10*3/uL (ref 0.7–4.0)
MCH: 27.3 pg (ref 26.0–34.0)
MCHC: 35.3 g/dL (ref 30.0–36.0)
MCV: 77.3 fL — AB (ref 78.0–100.0)
Monocytes Absolute: 1.2 10*3/uL — ABNORMAL HIGH (ref 0.1–1.0)
Monocytes Relative: 18 % — ABNORMAL HIGH (ref 3–12)
NEUTROS ABS: 3.2 10*3/uL (ref 1.7–7.7)
Neutrophils Relative %: 50 % (ref 43–77)
PLATELETS: 118 10*3/uL — AB (ref 150–400)
RBC: 4.22 MIL/uL (ref 4.22–5.81)
RDW: 15 % (ref 11.5–15.5)
WBC: 6.6 10*3/uL (ref 4.0–10.5)

## 2015-01-09 LAB — RETICULOCYTES
RBC.: 4.22 MIL/uL (ref 4.22–5.81)
RETIC COUNT ABSOLUTE: 177.2 10*3/uL (ref 19.0–186.0)
RETIC CT PCT: 4.2 % — AB (ref 0.4–3.1)

## 2015-01-09 MED ORDER — FOLIC ACID 1 MG PO TABS
1.0000 mg | ORAL_TABLET | Freq: Every day | ORAL | Status: DC
  Administered 2015-01-09: 1 mg via ORAL
  Filled 2015-01-09: qty 1

## 2015-01-09 MED ORDER — DIPHENHYDRAMINE HCL 12.5 MG/5ML PO ELIX
12.5000 mg | ORAL_SOLUTION | Freq: Four times a day (QID) | ORAL | Status: DC | PRN
  Administered 2015-01-09: 12.5 mg via ORAL
  Filled 2015-01-09: qty 5

## 2015-01-09 MED ORDER — NALOXONE HCL 0.4 MG/ML IJ SOLN: 0.4000 mg | INTRAMUSCULAR | Status: DC | PRN

## 2015-01-09 MED ORDER — SODIUM CHLORIDE 0.9 % IJ SOLN: 9.0000 mL | INTRAMUSCULAR | Status: DC | PRN

## 2015-01-09 MED ORDER — POLYETHYLENE GLYCOL 3350 17 G PO PACK: 17.0000 g | PACK | Freq: Every day | ORAL | Status: DC | PRN

## 2015-01-09 MED ORDER — SENNOSIDES-DOCUSATE SODIUM 8.6-50 MG PO TABS
1.0000 | ORAL_TABLET | Freq: Two times a day (BID) | ORAL | Status: DC
  Administered 2015-01-09: 1 via ORAL
  Filled 2015-01-09: qty 1

## 2015-01-09 MED ORDER — DEXTROSE-NACL 5-0.45 % IV SOLN
INTRAVENOUS | Status: DC
  Administered 2015-01-09: 10:00:00 via INTRAVENOUS

## 2015-01-09 MED ORDER — ONDANSETRON HCL 4 MG/2ML IJ SOLN: 4.0000 mg | Freq: Four times a day (QID) | INTRAMUSCULAR | Status: DC | PRN

## 2015-01-09 MED ORDER — SODIUM CHLORIDE 0.9 % IV SOLN: 12.5000 mg | Freq: Four times a day (QID) | INTRAVENOUS | Status: DC | PRN

## 2015-01-09 MED ORDER — HYDROMORPHONE 2 MG/ML HIGH CONCENTRATION IV PCA SOLN
INTRAVENOUS | Status: DC
  Administered 2015-01-09: 5 mg via INTRAVENOUS
  Administered 2015-01-09: 6.5 mg via INTRAVENOUS
  Administered 2015-01-09: 10:00:00 via INTRAVENOUS
  Filled 2015-01-09: qty 25

## 2015-01-09 NOTE — Telephone Encounter (Signed)
Spoke with Dr. Hyman Hopes and it is ok for patient to come to [facility].  Patient verbalizes understanding.

## 2015-01-09 NOTE — Progress Notes (Addendum)
Pt's pain # is now 4/10. Dr. Hyman Hopes notified an orders given for discharge to home. Pt has appt for Monday 01/13/15 at 10:30am to see NP Concepcion Living. Pt is alert and oriented and ambulatory at this time. Enc to cont to take pain meds as needed around the clock for pain. Okay to return to work on Monday January 13, 2015 after his appt here per Dr. Hyman Hopes.

## 2015-01-09 NOTE — Telephone Encounter (Signed)
Patient C/O pain to back, right leg and shoulder, that is 8/10 on pain scale.  Patient denies shortness of breath or difficulty breathing, denies abdominal pain, denies diarrhea or nausea.  Patient has taken ibuprofen  and hydrocodone without improvement.  I advised I would notify the physician and give him a call back.  Patient verbalizes understanding.

## 2015-01-09 NOTE — H&P (Signed)
[facility] History and Physical  William Jennings FAO:[id number] DOB: 06/30/1990 DOA: 01/09/2015  PCP: MATTHEWS,MICHELLE A., MD   Chief Complaint: Pain  HPI: William Jennings is a 24 y.o. male presents with c/o pain in back and legs x 2 days which has worsened despite use of oral pain medications. Pain to back, right leg and shoulder, that is 8/10 on pain scale. Patient denies shortness of breath or difficulty breathing, denies abdominal pain, denies diarrhea or nausea. Patient has taken ibuprofen  and hydrocodone without improvement.   Systemic Review: General: The patient denies anorexia, fever, weight loss Cardiac: Denies chest pain, syncope, palpitations, pedal edema  Respiratory: Denies cough, shortness of breath, wheezing GI: Denies severe indigestion/heartburn, abdominal pain, nausea, vomiting, diarrhea and constipation GU: Denies hematuria, incontinence, dysuria  Skin: Denies suspicious skin lesions Neurologic: Denies focal weakness or numbness, change in vision  Past Medical History  Diagnosis Date  . Sickle cell disease     Past Surgical History  Procedure Laterality Date  . Cholecystectomy      Allergies  Allergen Reactions  . Shellfish Allergy Anaphylaxis    No family history on file.    Prior to Admission medications   Medication Sig Start Date End Date Taking? Authorizing Provider  folic acid (FOLVITE) 800 MCG tablet Take 1 tablet (800 mcg total) by mouth daily. 02/21/14  Yes Massie Maroon, FNP  HYDROcodone-acetaminophen (NORCO/VICODIN) 5-325 MG per tablet Take 2 tablets by mouth every 4 (four) hours as needed. 01/07/15  Yes Rolland Porter, MD  ibuprofen (ADVIL,MOTRIN) 800 MG tablet Take 1 tablet (800 mg total) by mouth every 8 (eight) hours as needed for mild pain or moderate pain. 07/16/14  Yes Altha Harm, MD     Physical Exam: Filed Vitals:   01/09/15 0909  BP: 120/71  Pulse: 64  Temp: 98.1 F (36.7 C)  Resp: 16  Height:   (1.727 m)  Weight: 145 lb (65.772 kg)  SpO2: 100%     General: Alert, awake, afebrile, anicteric, not in obvious distress HEENT: Normocephalic and Atraumatic, Mucous membranes pink                PERRLA; EOM intact; No scleral icterus,                 Nares: Patent, Oropharynx: Clear, Fair Dentition                 Neck: FROM, no cervical lymphadenopathy, thyromegaly, carotid bruit or JVD;  Breasts: deferred CHEST WALL: No tenderness  CHEST: Normal respiration, clear to auscultation bilaterally  HEART: Regular rate and rhythm; no murmurs rubs or gallops  BACK: No kyphosis or scoliosis; no CVA tenderness  ABDOMEN: Positive Bowel Sounds, soft, non-tender; no masses, no organomegaly Rectal Exam: deferred EXTREMITIES: No cyanosis, clubbing, or edema Genitalia: not examined  SKIN:  no rash or ulceration  CNS: Alert and Oriented x 4, Nonfocal exam, CN 2-12 intact  Labs on Admission:  Basic Metabolic Panel:  Recent Labs Lab 01/07/15 1440  NA 137  K 3.9  CL 107  CO2 27  GLUCOSE 102*  BUN 7  CREATININE 0.90  CALCIUM 9.0   Liver Function Tests:  Recent Labs Lab 01/07/15 1440  AST 31  ALT 16*  ALKPHOS 91  BILITOT 1.7*  PROT 8.0  ALBUMIN 4.6   No results for input(s): LIPASE, AMYLASE in the last 168 hours. No results for input(s): AMMONIA in the last 168 hours. CBC:  Recent  Labs Lab 01/07/15 1440  WBC 8.4  NEUTROABS 3.7  HGB 13.2  HCT 35.9*  MCV 76.9*  PLT 149*   Cardiac Enzymes: No results for input(s): CKTOTAL, CKMB, CKMBINDEX, TROPONINI in the last 168 hours.  BNP (last 3 results) No results for input(s): BNP in the last 8760 hours.  ProBNP (last 3 results) No results for input(s): PROBNP in the last 8760 hours.  CBG: No results for input(s): GLUCAP in the last 168 hours.   Assessment/Plan Active Problems:   Sickle cell crisis    [facility]  IVF D5 .45% Saline @ 125 mls/hour  Weight based Dilaudid PCA  IV  Toradol  Monitor vitals very closely  Patient will be re-evaluated for pain in the context of function and relationship to baseline as care progresses.  If no significant relieve from pain (remains above 5/10) will transfer patient to inpatient services for further evaluation and management  Code Status: Full Family Communication: None DVT Prophylaxis: Ambulate as tolerated   Time spent: 45  Admir Candelas, MD, MHA, FACP, CPE   If 7PM-7AM, please contact night-coverage [email or web address] 01/09/2015, 9:34 AM

## 2015-01-13 ENCOUNTER — Ambulatory Visit: Payer: Self-pay | Admitting: Family Medicine

## 2015-01-13 NOTE — Discharge Summary (Signed)
Physician Discharge Summary  William Jennings:[id number] DOB: 09-Jan-1991 DOA: 01/09/2015  PCP: MATTHEWS,MICHELLE A., MD  Admit date: 01/09/2015 Discharge date: 01/09/2015  Time spent: 30 minutes  Discharge Diagnoses:  Active Problems:   Sickle cell crisis   Discharge Condition: Stable  Diet recommendation: Regular  Filed Weights   01/09/15 0909  Weight: 145 lb (65.772 kg)    History of present illness:  William Jennings is a 24 y.o. male presents with c/o pain in back and legs x 2 days which has worsened despite use of oral pain medications. Pain to back, right leg and shoulder, that is 8/10 on pain scale. Patient denies shortness of breath or difficulty breathing, denies abdominal pain, denies diarrhea or nausea. Patient has taken ibuprofen  and hydrocodone without improvement.  Hospital Course:  Patient was admitted to the day Hospital and treated with standard protocol for sickle cell pain management, patient got better with pain scale reduced to 4 out of 10. Patient was discharged home to continue home pain medications.   Discharge Exam: Filed Vitals:   01/09/15 1644  BP:   Pulse:   Temp:   Resp: 17   General: Alert, awake, oriented x3, in no acute distress.  HEENT: [place]/AT PEERL, EOMI, anicteric. Neck: Trachea midline, no masses, no thyromegal,y no JVD, no carotid bruit OROPHARYNX: Moist, No exudate/ erythema/lesions.  Heart: Regular rate and rhythm, without murmurs, rubs, gallops.  Lungs: Clear to auscultation, no wheezing or rhonchi noted. No increased vocal fremitus resonant to percussion  Abdomen: Soft, nontender, nondistended, positive bowel sounds, no masses no hepatosplenomegaly noted..  Neuro: No focal neurological deficits noted cranial nerves II through XII grossly intact. Strength normal in bilateral upper and lower extremities. Musculoskeletal: No warm swelling or erythema around joints, no spinal tenderness noted. Psychiatric: Patient  alert and oriented x3, good insight and cognition, good recent   Discharge Instructions  Discharge Medication List as of 01/09/2015  5:09 PM    CONTINUE these medications which have NOT CHANGED   Details  folic acid (FOLVITE) 800 MCG tablet Take 1 tablet (800 mcg total) by mouth daily., Starting 02/21/2014, Until Discontinued, Normal    HYDROcodone-acetaminophen (NORCO/VICODIN) 5-325 MG per tablet Take 2 tablets by mouth every 4 (four) hours as needed., Starting 01/07/2015, Until Discontinued, Print    ibuprofen (ADVIL,MOTRIN) 800 MG tablet Take 1 tablet (800 mg total) by mouth every 8 (eight) hours as needed for mild pain or moderate pain., Starting 07/16/2014, Until Discontinued, Normal       Allergies  Allergen Reactions  . Shellfish Allergy Anaphylaxis     The results of significant diagnostics from this hospitalization (including imaging, microbiology, ancillary and laboratory) are listed below for reference.    Significant Diagnostic Studies: Dg Chest 2 View  01/07/2015   CLINICAL DATA:  Chest pain.  Sickle cell disease  EXAM: CHEST  2 VIEW  COMPARISON:  August 13, 2013  FINDINGS: Lungs are clear. Heart size and pulmonary vascularity are normal. No adenopathy. There are multiple vertebral body endplate infarcts consistent with sickle cell disease. Sclerosis in both humeral heads is also consistent with bony changes of sickle cell disease. Spleen appears somewhat enlarged. There is mild upper thoracic dextroscoliosis.  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 Lungs clear. No adenopathy. Spleen appears enlarged.   Electronically Signed   By: Bretta Bang III M.D.   On: 01/07/2015 15:33    Microbiology: No results found for this or any previous visit (from the past 240  hour(s)).   Labs: Basic Metabolic Panel:  Recent Labs Lab 01/07/15 1440 01/09/15 0935  NA 137 137  K 3.9 3.6  CL 107 107  CO2 27 25  GLUCOSE 102* 96  BUN 7 9  CREATININE 0.90 0.83   CALCIUM 9.0 8.9   Liver Function Tests:  Recent Labs Lab 01/07/15 1440 01/09/15 0935  AST 31 26  ALT 16* 16*  ALKPHOS 91 105  BILITOT 1.7* 1.2  PROT 8.0 7.7  ALBUMIN 4.6 4.5   No results for input(s): LIPASE, AMYLASE in the last 168 hours. No results for input(s): AMMONIA in the last 168 hours. CBC:  Recent Labs Lab 01/07/15 1440 01/09/15 0935  WBC 8.4 6.6  NEUTROABS 3.7 3.2  HGB 13.2 11.5*  HCT 35.9* 32.6*  MCV 76.9* 77.3*  PLT 149* 118*   Cardiac Enzymes: No results for input(s): CKTOTAL, CKMB, CKMBINDEX, TROPONINI in the last 168 hours. BNP: BNP (last 3 results) No results for input(s): BNP in the last 8760 hours.  ProBNP (last 3 results) No results for input(s): PROBNP in the last 8760 hours.  CBG: No results for input(s): GLUCAP in the last 168 hours.  SignedJeanann Lewandowsky  [facility] 01/13/2015, 9:13 AM

## 2015-01-15 ENCOUNTER — Ambulatory Visit: Payer: Self-pay | Admitting: Family Medicine

## 2015-04-15 ENCOUNTER — Encounter (HOSPITAL_BASED_OUTPATIENT_CLINIC_OR_DEPARTMENT_OTHER): Payer: Self-pay

## 2015-04-15 ENCOUNTER — Emergency Department (HOSPITAL_BASED_OUTPATIENT_CLINIC_OR_DEPARTMENT_OTHER)
Admission: EM | Admit: 2015-04-15 | Discharge: 2015-04-15 | Disposition: A | Discharge status: Home / Self Care | Payer: Self-pay | Attending: Emergency Medicine | Admitting: Emergency Medicine

## 2015-04-15 DIAGNOSIS — Z79899 Other long term (current) drug therapy: Secondary | ICD-10-CM | POA: Insufficient documentation

## 2015-04-15 DIAGNOSIS — D5700 Hb-SS disease with crisis, unspecified: Secondary | ICD-10-CM | POA: Insufficient documentation

## 2015-04-15 DIAGNOSIS — Z87891 Personal history of nicotine dependence: Secondary | ICD-10-CM | POA: Insufficient documentation

## 2015-04-15 LAB — COMPREHENSIVE METABOLIC PANEL
ALK PHOS: 75 U/L (ref 38–126)
ALT: 15 U/L — ABNORMAL LOW (ref 17–63)
ANION GAP: 5 (ref 5–15)
AST: 26 U/L (ref 15–41)
Albumin: 4.2 g/dL (ref 3.5–5.0)
BILIRUBIN TOTAL: 1.2 mg/dL (ref 0.3–1.2)
BUN: 6 mg/dL (ref 6–20)
CALCIUM: 8.8 mg/dL — AB (ref 8.9–10.3)
CO2: 26 mmol/L (ref 22–32)
Chloride: 107 mmol/L (ref 101–111)
Creatinine, Ser: 0.8 mg/dL (ref 0.61–1.24)
GFR calc Af Amer: >60 mL/min (ref >60)
GFR calc non Af Amer: >60 mL/min (ref >60)
GLUCOSE: 106 mg/dL — AB (ref 65–99)
Potassium: 3.7 mmol/L (ref 3.5–5.1)
Sodium: 138 mmol/L (ref 135–145)
TOTAL PROTEIN: 7.3 g/dL (ref 6.5–8.1)

## 2015-04-15 LAB — CBC WITH DIFFERENTIAL/PLATELET
BASOS PCT: 0 %
Basophils Absolute: 0 10*3/uL (ref 0.0–0.1)
Eosinophils Absolute: 0 10*3/uL (ref 0.0–0.7)
Eosinophils Relative: 0 %
HEMATOCRIT: 32 % — AB (ref 39.0–52.0)
HEMOGLOBIN: 11.8 g/dL — AB (ref 13.0–17.0)
LYMPHS ABS: 2.4 10*3/uL (ref 0.7–4.0)
LYMPHS PCT: 26 %
MCH: 28.2 pg (ref 26.0–34.0)
MCHC: 36.9 g/dL — AB (ref 30.0–36.0)
MCV: 76.4 fL — AB (ref 78.0–100.0)
MONOS PCT: 10 %
Monocytes Absolute: 0.9 10*3/uL (ref 0.1–1.0)
NEUTROS ABS: 6.1 10*3/uL (ref 1.7–7.7)
NEUTROS PCT: 64 %
Platelets: 119 10*3/uL — ABNORMAL LOW (ref 150–400)
RBC: 4.19 MIL/uL — ABNORMAL LOW (ref 4.22–5.81)
RDW: 14.4 % (ref 11.5–15.5)
WBC: 9.5 10*3/uL (ref 4.0–10.5)

## 2015-04-15 LAB — RETICULOCYTES
RBC.: 4.66 MIL/uL (ref 4.22–5.81)
RETIC COUNT ABSOLUTE: 191.1 10*3/uL — AB (ref 19.0–186.0)
Retic Ct Pct: 4.1 % — ABNORMAL HIGH (ref 0.4–3.1)

## 2015-04-15 MED ORDER — HYDROMORPHONE HCL 1 MG/ML IJ SOLN: 0.0250 mg/kg | INTRAMUSCULAR | Status: DC

## 2015-04-15 MED ORDER — SODIUM CHLORIDE 0.9 % IV BOLUS (SEPSIS): 1000.0000 mL | Freq: Once | INTRAVENOUS | Status: DC

## 2015-04-15 MED ORDER — HYDROMORPHONE HCL 1 MG/ML IJ SOLN
1.0000 mg | Freq: Once | INTRAMUSCULAR | Status: AC
  Administered 2015-04-15: 1 mg via INTRAVENOUS
  Filled 2015-04-15: qty 1

## 2015-04-15 MED ORDER — SODIUM CHLORIDE 0.9 % IV BOLUS (SEPSIS)
1000.0000 mL | Freq: Once | INTRAVENOUS | Status: AC
  Administered 2015-04-15 (×2): 1000 mL via INTRAVENOUS

## 2015-04-15 MED ORDER — ONDANSETRON HCL 4 MG/2ML IJ SOLN
4.0000 mg | Freq: Once | INTRAMUSCULAR | Status: AC
  Administered 2015-04-15: 4 mg via INTRAVENOUS
  Filled 2015-04-15: qty 2

## 2015-04-15 MED ORDER — KETOROLAC TROMETHAMINE 30 MG/ML IJ SOLN
30.0000 mg | Freq: Once | INTRAMUSCULAR | Status: AC
  Administered 2015-04-15: 30 mg via INTRAVENOUS
  Filled 2015-04-15: qty 1

## 2015-04-15 NOTE — ED Notes (Addendum)
C/o pan to both legs and right arm x 1 week-reports as [facility] pain-pt NAD-steady gait-pt states he is followed by [facility]-has not been in approx 2 months-did not contact clinic in the last week for pain c/o-out of hydrocodone x approx 2 months

## 2015-04-15 NOTE — ED Provider Notes (Signed)
CSN: [id number]     Arrival date & time 04/15/15  1312 History   First MD Initiated Contact with Patient 04/15/15 1555     Chief Complaint  Patient presents with  . Sickle Cell Pain Crisis     (Consider location/radiation/quality/duration/timing/severity/associated sxs/prior Treatment) Patient is a 24 y.o. male presenting with sickle cell pain.  Sickle Cell Pain Crisis Pain location: bil legs. Severity:  Severe Onset quality:  Gradual Duration:  1 week Similar to previous crisis episodes: yes   Timing:  Constant Progression:  Unchanged Chronicity:  Recurrent Context: cold exposure   Relieved by:  Nothing Worsened by:  Activity and movement Associated symptoms: no chest pain, no cough, no fever, no headaches, no nausea, no shortness of breath and no vomiting     Past Medical History  Diagnosis Date  . Sickle cell disease [facility](HCC)    Past Surgical History  Procedure Laterality Date  . Cholecystectomy     No family history on file. Social History  Substance Use Topics  . Smoking status: Former Games developermoker  . Smokeless tobacco: Never Used  . Alcohol Use: No    Review of Systems  Constitutional: Negative for fever.  Respiratory: Negative for cough and shortness of breath.   Cardiovascular: Negative for chest pain.  Gastrointestinal: Negative for nausea and vomiting.  Neurological: Negative for headaches.  All other systems reviewed and are negative.     Allergies  Shellfish allergy  Home Medications   Prior to Admission medications   Medication Sig Start Date End Date Taking? Authorizing Provider  folic acid (FOLVITE) 800 MCG tablet Take 1 tablet (800 mcg total) by mouth daily. 02/21/14   Massie MaroonLachina M Hollis, FNP  ibuprofen (ADVIL,MOTRIN) 800 MG tablet Take 1 tablet (800 mg total) by mouth every 8 (eight) hours as needed for mild pain or moderate pain. 07/16/14   Altha HarmMichelle A Matthews, MD   BP 132/85 mmHg  Pulse 66  Temp(Src) 98.5 F (36.9 C) (Oral)  Resp 18  Ht 5\' 8"   (1.727 m)  Wt 140 lb (63.504 kg)  BMI 21.29 kg/m2  SpO2 100% Physical Exam  Constitutional: He is oriented to person, place, and time. He appears well-developed and well-nourished.  HENT:  Head: Normocephalic and atraumatic.  Eyes: Conjunctivae and EOM are normal.  Neck: Normal range of motion. Neck supple.  Cardiovascular: Normal rate, regular rhythm and normal heart sounds.   Pulmonary/Chest: Effort normal and breath sounds normal. No respiratory distress.  Abdominal: He exhibits no distension. There is no tenderness. There is no rebound and no guarding.  Musculoskeletal: Normal range of motion.  Tenderness in bil thighs and R arm  Neurological: He is alert and oriented to person, place, and time.  Skin: Skin is warm and dry.  Vitals reviewed.   ED Course  Procedures (including critical care time) Labs Review Labs Reviewed  CBC  COMPREHENSIVE METABOLIC PANEL    Imaging Review No results found. I have personally reviewed and evaluated these images and lab results as part of my medical decision-making.   EKG Interpretation None      MDM   Final diagnoses:  None    24 y.o. male with pertinent PMH of sickle cell presents with recurrent sickle cell pain. No chest pain, other signs of emergent pathology. Given 3 rounds of Dilaudid and Toradol with relief of symptoms. Patient requests discharge home. Discharged home in stable condition with standard return precautions..    I have reviewed all laboratory and imaging studies if ordered  as above  1. Sickle cell pain crisis [facility])         Mirian Mo, MD 04/17/15 [phone]

## 2015-04-15 NOTE — ED Notes (Signed)
Called for vital sign reassessment 3 times with no response.

## 2015-04-15 NOTE — ED Notes (Signed)
MD at bedside. 

## 2015-04-15 NOTE — Discharge Instructions (Signed)
Sickle Cell Anemia, Adult Sickle cell anemia is a condition in which red blood cells have an abnormal "sickle" shape. This abnormal shape shortens the cells' life span, which results in a lower than normal concentration of red blood cells in the blood. The sickle shape also causes the cells to clump together and block free blood flow through the blood vessels. As a result, the tissues and organs of the body do not receive enough oxygen. Sickle cell anemia causes organ damage and pain and increases the risk of infection. CAUSES  Sickle cell anemia is a genetic disorder. Those who receive two copies of the gene have the condition, and those who receive one copy have the trait. RISK FACTORS The sickle cell gene is most common in people whose families originated in [place]. Other areas of the globe where sickle cell trait occurs include the Mediterranean, [place] and [place], the [place], and the [place].  SIGNS AND SYMPTOMS  Pain, especially in the extremities, back, chest, or abdomen (common). The pain may start suddenly or may develop following an illness, especially if there is dehydration. Pain can also occur due to overexertion or exposure to extreme temperature changes.  Frequent severe bacterial infections, especially certain types of pneumonia and meningitis.  Pain and swelling in the hands and feet.  Decreased activity.   Loss of appetite.   Change in behavior.  Headaches.  Seizures.  Shortness of breath or difficulty breathing.  Vision changes.  Skin ulcers. Those with the trait may not have symptoms or they may have mild symptoms.  DIAGNOSIS  Sickle cell anemia is diagnosed with blood tests that demonstrate the genetic trait. It is often diagnosed during the newborn period, due to mandatory testing nationwide. A variety of blood tests, X-rays, CT scans, MRI scans, ultrasounds, and lung function tests may also be done to monitor the condition. TREATMENT  Sickle  cell anemia may be treated with:  Medicines. You may be given pain medicines, antibiotic medicines (to treat and prevent infections) or medicines to increase the production of certain types of hemoglobin.  Fluids.  Oxygen.  Blood transfusions. HOME CARE INSTRUCTIONS   Drink enough fluid to keep your urine clear or pale yellow. Increase your fluid intake in hot weather and during exercise.  Do not smoke. Smoking lowers oxygen levels in the blood.   Only take over-the-counter or prescription medicines for pain, fever, or discomfort as directed by your health care provider.  Take antibiotics as directed by your health care provider. Make sure you finish them it even if you start to feel better.   Take supplements as directed by your health care provider.   Consider wearing a medical alert bracelet. This tells anyone caring for you in an emergency of your condition.   When traveling, keep your medical information, health care provider's names, and the medicines you take with you at all times.   If you develop a fever, do not take medicines to reduce the fever right away. This could cover up a problem that is developing. Notify your health care provider.  Keep all follow-up appointments with your health care provider. Sickle cell anemia requires regular medical care. SEEK MEDICAL CARE IF: You have a fever. SEEK IMMEDIATE MEDICAL CARE IF:   You feel dizzy or faint.   You have new abdominal pain, especially on the left side near the stomach area.   You develop a persistent, often uncomfortable and painful penile erection (priapism). If this is not treated immediately it   will lead to impotence.   You have numbness your arms or legs or you have a hard time moving them.   You have a hard time with speech.   You have a fever or persistent symptoms for more than 2-3 days.   You have a fever and your symptoms suddenly get worse.   You have signs or symptoms of infection.  These include:   Chills.   Abnormal tiredness (lethargy).   Irritability.   Poor eating.   Vomiting.   You develop pain that is not helped with medicine.   You develop shortness of breath.  You have pain in your chest.   You are coughing up pus-like or bloody sputum.   You develop a stiff neck.  Your feet or hands swell or have pain.  Your abdomen appears bloated.  You develop joint pain. MAKE SURE YOU:  Understand these instructions.   This information is not intended to replace advice given to you by your health care provider. Make sure you discuss any questions you have with your health care provider.   Document Released: 09/08/2005 Document Revised: 06/21/2014 Document Reviewed: 01/10/2013 Elsevier Interactive Patient Education 2016 Elsevier Inc.  

## 2015-10-16 ENCOUNTER — Emergency Department (HOSPITAL_BASED_OUTPATIENT_CLINIC_OR_DEPARTMENT_OTHER)
Admission: EM | Admit: 2015-10-16 | Discharge: 2015-10-16 | Disposition: A | Discharge status: Home / Self Care | Payer: Self-pay | Attending: Emergency Medicine | Admitting: Emergency Medicine

## 2015-10-16 ENCOUNTER — Encounter (HOSPITAL_BASED_OUTPATIENT_CLINIC_OR_DEPARTMENT_OTHER): Payer: Self-pay | Admitting: *Deleted

## 2015-10-16 ENCOUNTER — Emergency Department (HOSPITAL_BASED_OUTPATIENT_CLINIC_OR_DEPARTMENT_OTHER): Payer: Self-pay

## 2015-10-16 DIAGNOSIS — D5700 Hb-SS disease with crisis, unspecified: Secondary | ICD-10-CM

## 2015-10-16 DIAGNOSIS — Z87891 Personal history of nicotine dependence: Secondary | ICD-10-CM | POA: Insufficient documentation

## 2015-10-16 LAB — COMPREHENSIVE METABOLIC PANEL
ALBUMIN: 4.6 g/dL (ref 3.5–5.0)
ALT: 16 U/L — ABNORMAL LOW (ref 17–63)
ANION GAP: 9 (ref 5–15)
AST: 34 U/L (ref 15–41)
Alkaline Phosphatase: 76 U/L (ref 38–126)
BUN: 7 mg/dL (ref 6–20)
CHLORIDE: 106 mmol/L (ref 101–111)
CO2: 23 mmol/L (ref 22–32)
Calcium: 9.1 mg/dL (ref 8.9–10.3)
Creatinine, Ser: 0.97 mg/dL (ref 0.61–1.24)
GFR calc Af Amer: >60 mL/min (ref >60)
GFR calc non Af Amer: >60 mL/min (ref >60)
GLUCOSE: 123 mg/dL — AB (ref 65–99)
POTASSIUM: 3.6 mmol/L (ref 3.5–5.1)
Sodium: 138 mmol/L (ref 135–145)
Total Bilirubin: 2.2 mg/dL — ABNORMAL HIGH (ref 0.3–1.2)
Total Protein: 7.8 g/dL (ref 6.5–8.1)

## 2015-10-16 LAB — CBC WITH DIFFERENTIAL/PLATELET
Basophils Absolute: 0 10*3/uL (ref 0.0–0.1)
Basophils Relative: 0 %
EOS PCT: 0 %
Eosinophils Absolute: 0 10*3/uL (ref 0.0–0.7)
HCT: 34.5 % — ABNORMAL LOW (ref 39.0–52.0)
HEMOGLOBIN: 13 g/dL (ref 13.0–17.0)
LYMPHS ABS: 4.4 10*3/uL — AB (ref 0.7–4.0)
LYMPHS PCT: 58 %
MCH: 29.1 pg (ref 26.0–34.0)
MCHC: 37.7 g/dL — ABNORMAL HIGH (ref 30.0–36.0)
MCV: 77.2 fL — AB (ref 78.0–100.0)
MONOS PCT: 6 %
Monocytes Absolute: 0.5 10*3/uL (ref 0.1–1.0)
NEUTROS ABS: 2.8 10*3/uL (ref 1.7–7.7)
Neutrophils Relative %: 36 %
PLATELETS: 194 10*3/uL (ref 150–400)
RBC: 4.47 MIL/uL (ref 4.22–5.81)
RDW: 14.4 % (ref 11.5–15.5)
WBC: 7.7 10*3/uL (ref 4.0–10.5)

## 2015-10-16 LAB — RETICULOCYTES
RBC.: 4.45 MIL/uL (ref 4.22–5.81)
RETIC COUNT ABSOLUTE: 213.6 10*3/uL — AB (ref 19.0–186.0)
RETIC CT PCT: 4.8 % — AB (ref 0.4–3.1)

## 2015-10-16 MED ORDER — HYDROMORPHONE HCL 1 MG/ML IJ SOLN
1.0000 mg | Freq: Once | INTRAMUSCULAR | Status: AC
  Administered 2015-10-16: 1 mg via INTRAVENOUS
  Filled 2015-10-16: qty 1

## 2015-10-16 MED ORDER — OXYCODONE-ACETAMINOPHEN 5-325 MG PO TABS: 1.0000 | ORAL_TABLET | ORAL | Status: DC | PRN

## 2015-10-16 MED ORDER — OXYCODONE-ACETAMINOPHEN 5-325 MG PO TABS
2.0000 | ORAL_TABLET | Freq: Once | ORAL | Status: AC
  Administered 2015-10-16: 2 via ORAL
  Filled 2015-10-16: qty 2

## 2015-10-16 MED ORDER — LEVOFLOXACIN 750 MG PO TABS
750.0000 mg | ORAL_TABLET | Freq: Once | ORAL | Status: AC
  Administered 2015-10-16: 750 mg via ORAL
  Filled 2015-10-16: qty 1

## 2015-10-16 MED ORDER — LEVOFLOXACIN 750 MG PO TABS: 750.0000 mg | ORAL_TABLET | Freq: Every day | ORAL | Status: DC

## 2015-10-16 MED ORDER — KETOROLAC TROMETHAMINE 30 MG/ML IJ SOLN
30.0000 mg | Freq: Once | INTRAMUSCULAR | Status: AC
  Administered 2015-10-16: 30 mg via INTRAVENOUS
  Filled 2015-10-16: qty 1

## 2015-10-16 NOTE — ED Notes (Signed)
Patient asking for his food to be heated up and patient wanting water. Both done for the patient

## 2015-10-16 NOTE — Discharge Instructions (Signed)
1. Medications: levaquin and pain medication, home medications 2. Treatment: rest, drink plenty of fluids 3. Follow Up: please followup with your primary doctor tomorrow morning for discussion of your diagnoses and further evaluation after today's visit; if you do not have a primary care doctor use the phone number listed in your discharge paperwork to find one; please return to the ER for chest pain, shortness of breath, new or worsening symptoms   Sickle Cell Anemia, Adult Sickle cell anemia is a condition where your red blood cells are shaped like sickles. Red blood cells carry oxygen through the body. Sickle-shaped red blood cells do not live as long as normal red blood cells. They also clump together and block blood from flowing through the blood vessels. These things prevent the body from getting enough oxygen. Sickle cell anemia causes organ damage and pain. It also increases the risk of infection. HOME CARE  Drink enough fluid to keep your pee (urine) clear or pale yellow. Drink more in hot weather and during exercise.  Do not smoke. Smoking lowers oxygen levels in the blood.  Only take over-the-counter or prescription medicines as told by your doctor.  Take antibiotic medicines as told by your doctor. Make sure you finish them even if you start to feel better.  Take supplements as told by your doctor.  Consider wearing a medical alert bracelet. This tells anyone caring for you in an emergency of your condition.  When traveling, keep your medical information, doctors' names, and the medicines you take with you at all times.  If you have a fever, do not take fever medicines right away. This could cover up a problem. Tell your doctor.   Keep all follow-up visits with your doctor. Sickle cell anemia requires regular medical care. GET HELP IF: You have a fever. GET HELP RIGHT AWAY IF:  You feel dizzy or faint.  You have new belly (abdominal) pain, especially on the left side near  the stomach area.  You have a lasting, often uncomfortable and painful erection of the penis (priapism). If it is not treated right away, you will become unable to have sex (impotence).  You have numbness in your arms or legs or you have a hard time moving them.  You have a hard time talking.  You have a fever or lasting symptoms for more than 2-3 days.  You have a fever and your symptoms suddenly get worse.  You have signs or symptoms of infection. These include:  Chills.  Being more tired than normal (lethargy).  Irritability.  Poor eating.  Throwing up (vomiting).  You have pain that is not helped with medicine.  You have shortness of breath.  You have pain in your chest.  You are coughing up pus-like or bloody mucus.  You have a stiff neck.  Your feet or hands swell or have pain.  Your belly looks bloated.  Your joints hurt. MAKE SURE YOU:  Understand these instructions.  Will watch your condition.  Will get help right away if you are not doing well or get worse.   This information is not intended to replace advice given to you by your health care provider. Make sure you discuss any questions you have with your health care provider.   Document Released: 03/21/2013 Document Revised: 10/15/2014 Document Reviewed: 03/21/2013 Elsevier Interactive Patient Education [employer].

## 2015-10-16 NOTE — ED Notes (Signed)
Sickle cell crisis. Pain in his right leg since yesterday.

## 2015-10-16 NOTE — ED Provider Notes (Signed)
CSN: [id number]     Arrival date & time 10/16/15  1714 History   First MD Initiated Contact with Patient 10/16/15 1719     Chief Complaint  Patient presents with  . Sickle Cell Pain Crisis    HPI   William Jennings is a 25 y.o. male with a PMH of sickle cell disease who presents to the ED with right lower extremity pain, which he states started yesterday and has been constant since that time. He reports his symptoms feel consistent with his sickle cell pain crisis. He states movement and walking exacerbate his pain. He has been taking 800 mg ibuprofen with mild symptom relief. He denies fever, chills, abdominal pain, nausea, vomiting, numbness, weakness, paresthesia, recent injury. He states he had intermittent chest pain yesterday, which he attributes to stress, and reports this is now resolved. He denies congestion or cough.   Past Medical History  Diagnosis Date  . Sickle cell disease [facility](HCC)    Past Surgical History  Procedure Laterality Date  . Cholecystectomy     No family history on file. Social History  Substance Use Topics  . Smoking status: Former Games developermoker  . Smokeless tobacco: Never Used  . Alcohol Use: No      Review of Systems  Constitutional: Negative for fever and chills.  HENT: Negative for congestion.   Respiratory: Negative for cough and shortness of breath.   Cardiovascular: Negative for chest pain.  Gastrointestinal: Negative for nausea, vomiting and abdominal pain.  Musculoskeletal: Positive for myalgias. Negative for back pain and neck pain.  Neurological: Negative for weakness and numbness.  All other systems reviewed and are negative.     Allergies  Shellfish allergy  Home Medications   Prior to Admission medications   Medication Sig Start Date End Date Taking? Authorizing Provider  folic acid (FOLVITE) 800 MCG tablet Take 1 tablet (800 mcg total) by mouth daily. 02/21/14   Massie MaroonLachina M Hollis, FNP  ibuprofen (ADVIL,MOTRIN) 800 MG tablet Take 1 tablet  (800 mg total) by mouth every 8 (eight) hours as needed for mild pain or moderate pain. 07/16/14   Altha HarmMichelle A Matthews, MD    BP 107/58 mmHg  Pulse 67  Temp(Src) 98.8 F (37.1 C) (Oral)  Resp 20  Ht 5\' 8"  (1.727 m)  Wt 65.772 kg  BMI 22.05 kg/m2  SpO2 99% Physical Exam  Constitutional: He is oriented to person, place, and time. He appears well-developed and well-nourished. No distress.  HENT:  Head: Normocephalic and atraumatic.  Right Ear: External ear normal.  Left Ear: External ear normal.  Nose: Nose normal.  Mouth/Throat: Uvula is midline, oropharynx is clear and moist and mucous membranes are normal.  Eyes: Conjunctivae, EOM and lids are normal. Pupils are equal, round, and reactive to light. Right eye exhibits no discharge. Left eye exhibits no discharge. No scleral icterus.  Neck: Normal range of motion. Neck supple.  Cardiovascular: Normal rate, regular rhythm, normal heart sounds, intact distal pulses and normal pulses.   Pulmonary/Chest: Effort normal and breath sounds normal. No respiratory distress. He has no wheezes. He has no rales.  Abdominal: Soft. Normal appearance and bowel sounds are normal. He exhibits no distension and no mass. There is no tenderness. There is no rigidity, no rebound and no guarding.  Musculoskeletal: Normal range of motion. He exhibits tenderness. He exhibits no edema.  Mild TTP to anterior aspect of right thigh and right knee. Full ROM. Strength and sensation intact. Distal pulses intact.  Neurological: He  is alert and oriented to person, place, and time. He has normal strength. No sensory deficit.  Skin: Skin is warm, dry and intact. No rash noted. He is not diaphoretic. No erythema. No pallor.  Psychiatric: He has a normal mood and affect. His speech is normal and behavior is normal.  Nursing note and vitals reviewed.   ED Course  Procedures (including critical care time)  Labs Review Labs Reviewed  COMPREHENSIVE METABOLIC PANEL -  Abnormal; Notable for the following:    Glucose, Bld 123 (*)    ALT 16 (*)    Total Bilirubin 2.2 (*)    All other components within normal limits  CBC WITH DIFFERENTIAL/PLATELET - Abnormal; Notable for the following:    HCT 34.5 (*)    MCV 77.2 (*)    MCHC 37.7 (*)    Lymphs Abs 4.4 (*)    All other components within normal limits  RETICULOCYTES - Abnormal; Notable for the following:    Retic Ct Pct 4.8 (*)    Retic Count, Manual 213.6 (*)    All other components within normal limits    Imaging Review Dg Chest 2 View  10/16/2015  CLINICAL DATA:  Sickle cell crisis.  Chest pain. EXAM: CHEST  2 VIEW COMPARISON:  01/07/2015 FINDINGS: Grossly unchanged cardiac silhouette and mediastinal contours. Potential developing airspace opacity within the right lower lung. There is unchanged mild diffuse slightly nodular thickening of the pulmonary interstitium. No pleural effusion or pneumothorax. No evidence of edema. Unchanged bones including stigmata of sickle cell disease throughout the thoracolumbar spine. Increased sclerosis involving the bilateral humeral heads. IMPRESSION: 1. Potential developing airspace opacity within the right lower lung which could be seen in the setting of acute chest syndrome in this patient with known sickle cell disease. 2. Stable osseous findings of sickle cell disease as above. Electronically Signed   By: Simonne Come M.D.   On: 10/16/2015 19:26   I have personally reviewed and evaluated these images and lab results as part of my medical decision-making.   EKG Interpretation None      MDM   Final diagnoses:  Sickle cell pain crisis (HCC)    25 year old male presents with sickle cell pain crisis. Notes pain in his right thigh and knee, which he states is consistent with his history of sickle cell pain crisis. Reports chest pain yesterday, which he attributes to stress, and states this is now resolved. Denies fever, chills, abdominal pain, nausea, vomiting,  numbness, weakness, paresthesia, recent injury.  Patient is afebrile. Vital signs stable. Heart RRR. Lungs clear to auscultation bilaterally. Abdomen soft, non-tender, non-distended. No rebound, guarding, or masses. TTP to anterior aspect of right thigh and right knee. Full ROM. Patient is NVI. No edema.  Labs pending. Patient given toradol and dilaudid.  CBC negative for leukocytosis or anemia. CMP remarkable for bilirubin 2.2. Reticulocyte 213. CXR remarkable for potential developing airspace opacity within the right lower lung which could be seen in the setting of acute chest, stable osseous findings of sickle cell disease as above.  Discussed findings with patient. On reassessment, he continues to deny chest pain. Vital signs stable. O2 sat 99% on RA. After 3 doses of dilaudid, he reports significant symptom improvement and is requesting to be discharged home. Spoke with patient regarding admission for further evaluation and management of opacity on CXR, though he continues to express that he would like to be discharged home and will follow-up with his PCP first thing in the  morning. Patient discussed with Dr. Verdie Mosher. Patient is non-toxic and well-appearing. Will treat with pain medication and antibiotic. Instructed him to follow-up with the sickle cell clinic in the AM. Strict return precautions discussed. Patient verbalizes his understanding and is in agreement with plan.  BP 107/58 mmHg  Pulse 67  Temp(Src) 98.8 F (37.1 C) (Oral)  Resp 20  Ht 5\' 8"  (1.727 m)  Wt 65.772 kg  BMI 22.05 kg/m2  SpO2 99%     Mady Gemma, PA-C 10/16/15 2324  Lavera Guise, MD 10/17/15 [id number]

## 2015-10-17 ENCOUNTER — Encounter (HOSPITAL_COMMUNITY): Payer: Self-pay | Admitting: *Deleted

## 2015-10-17 ENCOUNTER — Telehealth (HOSPITAL_COMMUNITY): Payer: Self-pay | Admitting: Hematology

## 2015-10-17 ENCOUNTER — Non-Acute Institutional Stay (HOSPITAL_COMMUNITY)
Admission: AD | Admit: 2015-10-17 | Discharge: 2015-10-17 | Disposition: A | Discharge status: Home / Self Care | Payer: Self-pay | Source: Ambulatory Visit | Attending: Internal Medicine | Admitting: Internal Medicine

## 2015-10-17 ENCOUNTER — Other Ambulatory Visit: Payer: Self-pay | Admitting: Internal Medicine

## 2015-10-17 ENCOUNTER — Non-Acute Institutional Stay (HOSPITAL_COMMUNITY): Payer: Self-pay

## 2015-10-17 DIAGNOSIS — D5701 Hb-SS disease with acute chest syndrome: Secondary | ICD-10-CM | POA: Insufficient documentation

## 2015-10-17 DIAGNOSIS — Z79899 Other long term (current) drug therapy: Secondary | ICD-10-CM | POA: Insufficient documentation

## 2015-10-17 DIAGNOSIS — D5700 Hb-SS disease with crisis, unspecified: Secondary | ICD-10-CM | POA: Diagnosis present

## 2015-10-17 LAB — CBC WITH DIFFERENTIAL/PLATELET
Basophils Absolute: 0 10*3/uL (ref 0.0–0.1)
Basophils Relative: 0 %
EOS ABS: 0 10*3/uL (ref 0.0–0.7)
Eosinophils Relative: 0 %
HEMATOCRIT: 34.6 % — AB (ref 39.0–52.0)
Hemoglobin: 12.8 g/dL — ABNORMAL LOW (ref 13.0–17.0)
LYMPHS ABS: 2.4 10*3/uL (ref 0.7–4.0)
Lymphocytes Relative: 31 %
MCH: 28.4 pg (ref 26.0–34.0)
MCHC: 37 g/dL — AB (ref 30.0–36.0)
MCV: 76.7 fL — ABNORMAL LOW (ref 78.0–100.0)
MONO ABS: 1.3 10*3/uL — AB (ref 0.1–1.0)
MONOS PCT: 17 %
NEUTROS ABS: 4 10*3/uL (ref 1.7–7.7)
Neutrophils Relative %: 51 %
Platelets: 201 10*3/uL (ref 150–400)
RBC: 4.51 MIL/uL (ref 4.22–5.81)
RDW: 15.3 % (ref 11.5–15.5)
WBC: 7.8 10*3/uL (ref 4.0–10.5)

## 2015-10-17 MED ORDER — SODIUM CHLORIDE 0.9 % IV SOLN: 12.5000 mg | Freq: Four times a day (QID) | INTRAVENOUS | Status: DC | PRN

## 2015-10-17 MED ORDER — SENNOSIDES-DOCUSATE SODIUM 8.6-50 MG PO TABS: 1.0000 | ORAL_TABLET | Freq: Two times a day (BID) | ORAL | Status: DC

## 2015-10-17 MED ORDER — FOLIC ACID 1 MG PO TABS
1.0000 mg | ORAL_TABLET | Freq: Every day | ORAL | Status: DC
  Administered 2015-10-17: 1 mg via ORAL
  Filled 2015-10-17: qty 1

## 2015-10-17 MED ORDER — POLYETHYLENE GLYCOL 3350 17 G PO PACK
17.0000 g | PACK | Freq: Every day | ORAL | Status: DC | PRN
  Filled 2015-10-17: qty 1

## 2015-10-17 MED ORDER — DEXTROSE-NACL 5-0.45 % IV SOLN
INTRAVENOUS | Status: DC
Start: 2015-10-17 — End: 2015-10-17
  Administered 2015-10-17: 12:00:00 via INTRAVENOUS

## 2015-10-17 MED ORDER — IBUPROFEN 800 MG PO TABS: 800.0000 mg | ORAL_TABLET | Freq: Three times a day (TID) | ORAL | Status: DC | PRN

## 2015-10-17 MED ORDER — ONDANSETRON HCL 4 MG/2ML IJ SOLN: 4.0000 mg | Freq: Four times a day (QID) | INTRAMUSCULAR | Status: DC | PRN

## 2015-10-17 MED ORDER — KETOROLAC TROMETHAMINE 30 MG/ML IJ SOLN
30.0000 mg | Freq: Four times a day (QID) | INTRAMUSCULAR | Status: DC
  Administered 2015-10-17: 30 mg via INTRAVENOUS
  Filled 2015-10-17: qty 1

## 2015-10-17 MED ORDER — DIPHENHYDRAMINE HCL 12.5 MG/5ML PO ELIX
12.5000 mg | ORAL_SOLUTION | Freq: Four times a day (QID) | ORAL | Status: DC | PRN
  Administered 2015-10-17 (×2): 12.5 mg via ORAL
  Filled 2015-10-17 (×2): qty 5

## 2015-10-17 MED ORDER — NALOXONE HCL 0.4 MG/ML IJ SOLN: 0.4000 mg | INTRAMUSCULAR | Status: DC | PRN

## 2015-10-17 MED ORDER — HYDROMORPHONE 1 MG/ML IV SOLN
INTRAVENOUS | Status: DC
  Administered 2015-10-17: 2.1 mg via INTRAVENOUS
  Administered 2015-10-17: 5.7 mg via INTRAVENOUS
  Administered 2015-10-17: 12:00:00 via INTRAVENOUS
  Filled 2015-10-17: qty 25

## 2015-10-17 MED ORDER — OXYCODONE-ACETAMINOPHEN 5-325 MG PO TABS: 1.0000 | ORAL_TABLET | Freq: Four times a day (QID) | ORAL | Status: DC | PRN

## 2015-10-17 MED ORDER — SODIUM CHLORIDE 0.9% FLUSH: 9.0000 mL | INTRAVENOUS | Status: DC | PRN

## 2015-10-17 NOTE — H&P (Signed)
[facility] History and Physical  William Jennings ZOX:[id number] DOB: 11/15/1990 DOA: 10/17/2015  PCP: MATTHEWS,MICHELLE A., MD   Chief Complaint: Pain in both arms and legs  HPI: William JarvisGeovanni D Baar is a 25 y.o. male with history of sickle cell disease presents to the day hospital with C/O pain to right leg that is 8/10 on pain scale. Patient states pain has been going on for 2 days.He went to ED yesterday afternoon and after treatment asked to be discharged home despite recommendation for admission based on CXR findings of evolving infiltrate concerning for ACS. Patient insists he has no chest pain, no SOB, no cough, no fever. But he continues to have pain in his legs especially the right. Patient denies N/V/D. 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 He manages his sickle cell very well and only occasionally takes percocet or oxycodone when prescribed.   Systemic Review: General: The patient denies anorexia, fever, weight loss Cardiac: Denies chest pain, syncope, palpitations, pedal edema  Respiratory: Denies cough, shortness of breath, wheezing GI: Denies severe indigestion/heartburn, abdominal pain, nausea, vomiting, diarrhea and constipation GU: Denies hematuria, incontinence, dysuria  Musculoskeletal: Denies arthritis  Skin: Denies suspicious skin lesions Neurologic: Denies focal weakness or numbness, change in vision  Past Medical History  Diagnosis Date  . Sickle cell disease [facility](HCC)     Past Surgical History  Procedure Laterality Date  . Cholecystectomy      Allergies  Allergen Reactions  . Shellfish Allergy Anaphylaxis    No family history on file.    Prior to Admission medications   Medication Sig Start Date End Date Taking? Authorizing Provider  ibuprofen (ADVIL,MOTRIN) 800 MG tablet Take 1 tablet (800 mg total) by mouth every 8 (eight) hours as needed for mild pain or moderate pain. 07/16/14  Yes Altha HarmMichelle A Matthews, MD   oxyCODONE-acetaminophen (PERCOCET/ROXICET) 5-325 MG tablet Take 1-2 tablets by mouth every 4 (four) hours as needed for severe pain. 10/16/15  Yes Mady GemmaElizabeth C Westfall, PA-C  folic acid (FOLVITE) 800 MCG tablet Take 1 tablet (800 mcg total) by mouth daily. 02/21/14   Massie MaroonLachina M Hollis, FNP  levofloxacin (LEVAQUIN) 750 MG tablet Take 1 tablet (750 mg total) by mouth daily. 10/16/15   Mady GemmaElizabeth C Westfall, PA-C     Physical Exam: Filed Vitals:   10/17/15 1037  BP: 123/83  Pulse: 51  Temp: 98.4 F (36.9 C)  TempSrc: Oral  Resp: 18  Height: 5\' 8"  (1.727 m)  Weight: 139 lb (63.05 kg)  SpO2: 100%    General: Alert, awake, afebrile, anicteric, not in obvious distress HEENT: Normocephalic and Atraumatic, Mucous membranes pink                PERRLA; EOM intact; No scleral icterus,                 Nares: Patent, Oropharynx: Clear, Fair Dentition                 Neck: FROM, no cervical lymphadenopathy, thyromegaly, carotid bruit or JVD;  CHEST WALL: No tenderness  CHEST: Normal respiration, clear to auscultation bilaterally  HEART: Regular rate and rhythm; no murmurs rubs or gallops  BACK: No kyphosis or scoliosis; no CVA tenderness  ABDOMEN: Positive Bowel Sounds, soft, non-tender; no masses, no organomegaly Rectal Exam: deferred EXTREMITIES: No cyanosis, clubbing, or edema SKIN:  no rash or ulceration  CNS: Alert and Oriented x 4, Nonfocal exam, CN 2-12 intact  Labs on Admission:  Basic Metabolic Panel:  Recent Labs Lab 10/16/15 1730  NA 138  K 3.6  CL 106  CO2 23  GLUCOSE 123*  BUN 7  CREATININE 0.97  CALCIUM 9.1   Liver Function Tests:  Recent Labs Lab 10/16/15 1730  AST 34  ALT 16*  ALKPHOS 76  BILITOT 2.2*  PROT 7.8  ALBUMIN 4.6   No results for input(s): LIPASE, AMYLASE in the last 168 hours. No results for input(s): AMMONIA in the last 168 hours. CBC:  Recent Labs Lab 10/16/15 1730  WBC 7.7  NEUTROABS 2.8  HGB 13.0  HCT 34.5*  MCV 77.2*  PLT 194    Cardiac Enzymes: No results for input(s): CKTOTAL, CKMB, CKMBINDEX, TROPONINI in the last 168 hours.  BNP (last 3 results) No results for input(s): BNP in the last 8760 hours.  ProBNP (last 3 results) No results for input(s): PROBNP in the last 8760 hours.  CBG: No results for input(s): GLUCAP in the last 168 hours.  CXR of 10/16/2015 IMPRESSION:  1. Potential developing airspace opacity within the right lower lung which could be seen in the setting of acute chest syndrome in this patient with known sickle cell disease. 2. Stable osseous findings of sickle cell disease as above.  Assessment/Plan Active Problems:   Sickle cell anemia with crisis (HCC)   Acute chest syndrome due to sickle cell crisis (HCC)  Repeat CXR today: IMPRESSION: No active cardiopulmonary disease. No appreciable residual right lower lung airspace consolidation   [facility] the [facility]  IVF D5 .45% Saline @ 150 mls/hour  Weight based Dilaudid PCA started within 30 minutes of admission  IV Toradol  30 mg Q 6 H  Monitor vitals very closely, Re-evaluate pain scale every hour  Oxygen by [place] 2L  Patient will be re-evaluated for pain in the context of function and relationship to baseline as care progresses.  If no significant relieve from pain (remains above 5/10) will transfer patient to inpatient services for further evaluation and management  Code Status: Full  Family Communication: None  DVT Prophylaxis: Ambulate as tolerated  Time spent: 35 Minutes  Olanrewaju Osborn, MD, MHA, FACP, FAAP, CPE  If 7PM-7AM, please contact night-coverage [email or web address] 10/17/2015, 11:26 AM

## 2015-10-17 NOTE — Telephone Encounter (Signed)
Advised I spoke to provider and [facility].  Patient verbalizes understanding.

## 2015-10-17 NOTE — Discharge Instructions (Signed)
Sickle Cell Anemia, Adult Sickle cell anemia is a condition in which red blood cells have an abnormal "sickle" shape. This abnormal shape shortens the cells' life span, which results in a lower than normal concentration of red blood cells in the blood. The sickle shape also causes the cells to clump together and block free blood flow through the blood vessels. As a result, the tissues and organs of the body do not receive enough oxygen. Sickle cell anemia causes organ damage and pain and increases the risk of infection. CAUSES  Sickle cell anemia is a genetic disorder. Those who receive two copies of the gene have the condition, and those who receive one copy have the trait. RISK FACTORS The sickle cell gene is most common in people whose families originated in [place]. Other areas of the globe where sickle cell trait occurs include the Mediterranean, [place] and [place], the [place], and the [place].  SIGNS AND SYMPTOMS  Pain, especially in the extremities, back, chest, or abdomen (common). The pain may start suddenly or may develop following an illness, especially if there is dehydration. Pain can also occur due to overexertion or exposure to extreme temperature changes.  Frequent severe bacterial infections, especially certain types of pneumonia and meningitis.  Pain and swelling in the hands and feet.  Decreased activity.   Loss of appetite.   Change in behavior.  Headaches.  Seizures.  Shortness of breath or difficulty breathing.  Vision changes.  Skin ulcers. Those with the trait may not have symptoms or they may have mild symptoms.  DIAGNOSIS  Sickle cell anemia is diagnosed with blood tests that demonstrate the genetic trait. It is often diagnosed during the newborn period, due to mandatory testing nationwide. A variety of blood tests, X-rays, CT scans, MRI scans, ultrasounds, and lung function tests may also be done to monitor the condition. TREATMENT  Sickle  cell anemia may be treated with:  Medicines. You may be given pain medicines, antibiotic medicines (to treat and prevent infections) or medicines to increase the production of certain types of hemoglobin.  Fluids.  Oxygen.  Blood transfusions. HOME CARE INSTRUCTIONS   Drink enough fluid to keep your urine clear or pale yellow. Increase your fluid intake in hot weather and during exercise.  Do not smoke. Smoking lowers oxygen levels in the blood.   Only take over-the-counter or prescription medicines for pain, fever, or discomfort as directed by your health care provider.  Take antibiotics as directed by your health care provider. Make sure you finish them it even if you start to feel better.   Take supplements as directed by your health care provider.   Consider wearing a medical alert bracelet. This tells anyone caring for you in an emergency of your condition.   When traveling, keep your medical information, health care provider's names, and the medicines you take with you at all times.   If you develop a fever, do not take medicines to reduce the fever right away. This could cover up a problem that is developing. Notify your health care provider.  Keep all follow-up appointments with your health care provider. Sickle cell anemia requires regular medical care. SEEK MEDICAL CARE IF: You have a fever. SEEK IMMEDIATE MEDICAL CARE IF:   You feel dizzy or faint.   You have new abdominal pain, especially on the left side near the stomach area.   You develop a persistent, often uncomfortable and painful penile erection (priapism). If this is not treated immediately it   will lead to impotence.   You have numbness your arms or legs or you have a hard time moving them.   You have a hard time with speech.   You have a fever or persistent symptoms for more than 2-3 days.   You have a fever and your symptoms suddenly get worse.   You have signs or symptoms of infection.  These include:   Chills.   Abnormal tiredness (lethargy).   Irritability.   Poor eating.   Vomiting.   You develop pain that is not helped with medicine.   You develop shortness of breath.  You have pain in your chest.   You are coughing up pus-like or bloody sputum.   You develop a stiff neck.  Your feet or hands swell or have pain.  Your abdomen appears bloated.  You develop joint pain. MAKE SURE YOU:  Understand these instructions.   This information is not intended to replace advice given to you by your health care provider. Make sure you discuss any questions you have with your health care provider.   Document Released: 09/08/2005 Document Revised: 06/21/2014 Document Reviewed: 01/10/2013 Elsevier Interactive Patient Education 2016 Elsevier Inc.  

## 2015-10-17 NOTE — Telephone Encounter (Signed)
Patient C/O pain to right leg that is 8/10 on pain scale.  Patient states pain has been going on for 2 days.  Went to ED yesterday afternoon and still not much improvement.  Patient denies N/V/D, chest pain or shortness of breath.  I advised I would notify the physician and give him a call back.  Patient verbalizes understanding.

## 2015-10-17 NOTE — Discharge Summary (Signed)
Physician Discharge Summary  William JarvisGeovanni D Jennings ZOX:[id number] DOB: 10/15/1990 DOA: 10/17/2015  PCP: MATTHEWS,MICHELLE A., MD  Admit date: 10/17/2015  Discharge date: 10/17/2015  Time spent: 30 minutes  Discharge Diagnoses:  Active Problems:   Sickle cell anemia with crisis (HCC)   Acute chest syndrome due to sickle cell crisis [facility](HCC)   Discharge Condition: Stable  Diet recommendation: Regular  Filed Weights   10/17/15 1037  Weight: 139 lb (63.05 kg)    History of present illness:  William Jennings is a 25 y.o. male with history of sickle cell disease presents to the day hospital with C/O pain to right leg that is 8/10 on pain scale. Patient states pain has been going on for 2 days.He went to ED yesterday afternoon and after treatment asked to be discharged home despite recommendation for admission based on CXR findings of evolving infiltrate concerning for ACS. Patient insists he has no chest pain, no SOB, no cough, no fever. But he continues to have pain in his legs especially the right. Patient denies N/V/D. 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 He manages his sickle cell very well and only occasionally takes percocet or oxycodone when prescribed.   Hospital Course:  William Jennings was admitted to the day hospital with sickle cell painful crisis. Patient was treated with weight based IV Dilaudid PCA, IV Toradol as well as IV fluids. His repeat CXR showed no cardiopulmonary disease.  Ezekial showed significant improvement symptomatically and pain went down from 8 to 5 out of 10 at the time of discharge.  William Jennings was advised to make appointment to re-establish care with [facility]. He was given a prescription of Percocet 5/325 and Ibuprofen, 60 tablets each. He signed a Pain Contract and promised to be accountable. He was discharged home in a good condition.  Discharge Exam: Filed Vitals:   10/17/15 1424 10/17/15 1525  BP: 114/43 106/51   Pulse: 70 74  Temp:    Resp: 17 9    General appearance: alert, cooperative and no distress Eyes: conjunctivae/corneas clear. PERRL, EOM's intact. Fundi benign. Neck: no adenopathy, no carotid bruit, no JVD, supple, symmetrical, trachea midline and thyroid not enlarged, symmetric, no tenderness/mass/nodules Back: symmetric, no curvature. ROM normal. No CVA tenderness. Resp: clear to auscultation bilaterally Chest wall: no tenderness Cardio: regular rate and rhythm, S1, S2 normal, no murmur, click, rub or gallop GI: soft, non-tender; bowel sounds normal; no masses, no organomegaly Extremities: extremities normal, atraumatic, no cyanosis or edema Pulses: 2+ and symmetric Skin: Skin color, texture, turgor normal. No rashes or lesions Neurologic: Grossly normal  Discharge Instructions We discussed the need for good hydration, monitoring of hydration status, avoidance of heat, cold, stress, and infection triggers. We discussed the need to be compliant with taking Hydrea. William Jennings was reminded of the need to seek medical attention of any symptoms of bleeding, anemia, or infection occurs.  Current Discharge Medication List    CONTINUE these medications which have NOT CHANGED   Details  folic acid (FOLVITE) 800 MCG tablet Take 1 tablet (800 mcg total) by mouth daily. Qty: 30 tablet, Refills: 6    ibuprofen (ADVIL,MOTRIN) 800 MG tablet Take 1 tablet (800 mg total) by mouth every 8 (eight) hours as needed for mild pain or moderate pain. Qty: 60 tablet, Refills: 3    levofloxacin (LEVAQUIN) 750 MG tablet Take 1 tablet (750 mg total) by mouth daily. Qty: 7 tablet, Refills: 0    oxyCODONE-acetaminophen (  PERCOCET/ROXICET) 5-325 MG tablet Take 1-2 tablets by mouth every 6 (six) hours as needed for severe pain. Qty: 60 tablet, Refills: 0       Allergies  Allergen Reactions  . Shellfish Allergy Anaphylaxis     Significant Diagnostic Studies: Dg Chest 2 View  10/17/2015  CLINICAL DATA:   Sickle cell disease.  Acute chest syndrome. EXAM: CHEST  2 VIEW COMPARISON:  Chest radiograph from one day prior. FINDINGS: Stable cardiomediastinal silhouette with normal heart size. No pneumothorax. No pleural effusion. Lungs appear clear, with no acute consolidative airspace disease and no pulmonary edema. Stable endplate changes in the spine characteristic of sickle cell disease. Stable sclerosis in the humeral heads bilaterally. Cholecystectomy clips are seen in the right upper quadrant of the abdomen. IMPRESSION: No active cardiopulmonary disease. No appreciable residual right lower lung airspace consolidation. Electronically Signed   By: Delbert Phenix M.D.   On: 10/17/2015 14:16   Dg Chest 2 View  10/16/2015  CLINICAL DATA:  Sickle cell crisis.  Chest pain. EXAM: CHEST  2 VIEW COMPARISON:  01/07/2015 FINDINGS: Grossly unchanged cardiac silhouette and mediastinal contours. Potential developing airspace opacity within the right lower lung. There is unchanged mild diffuse slightly nodular thickening of the pulmonary interstitium. No pleural effusion or pneumothorax. No evidence of edema. Unchanged bones including stigmata of sickle cell disease throughout the thoracolumbar spine. Increased sclerosis involving the bilateral humeral heads. IMPRESSION: 1. Potential developing airspace opacity within the right lower lung which could be seen in the setting of acute chest syndrome in this patient with known sickle cell disease. 2. Stable osseous findings of sickle cell disease as above. Electronically Signed   By: Simonne Come M.D.   On: 10/16/2015 19:26    Signed:  Jeanann Lewandowsky MD, MHA, FACP, FAAP, CPE   10/17/2015, 4:11 PM

## 2015-10-17 NOTE — Progress Notes (Signed)
Pt received to the [facility] for treatment. Pt stated his pain was an 8/10 on admission and down to 6/10 at discharge. Pt was treated with IV fluids, Dilaudid PCA and Toradol. At discharge, pt signed a opioid contract and was given prescriptions with verbal understanding how to take them. Pt was alert, oriented and ambulatory at discharge.

## 2016-01-05 ENCOUNTER — Telehealth (HOSPITAL_COMMUNITY): Payer: Self-pay | Admitting: Hematology

## 2016-01-05 ENCOUNTER — Emergency Department (HOSPITAL_BASED_OUTPATIENT_CLINIC_OR_DEPARTMENT_OTHER)
Admission: EM | Admit: 2016-01-05 | Discharge: 2016-01-05 | Disposition: A | Discharge status: Home / Self Care | Payer: Self-pay | Attending: Emergency Medicine | Admitting: Emergency Medicine

## 2016-01-05 ENCOUNTER — Encounter (HOSPITAL_BASED_OUTPATIENT_CLINIC_OR_DEPARTMENT_OTHER): Payer: Self-pay | Admitting: *Deleted

## 2016-01-05 DIAGNOSIS — D5700 Hb-SS disease with crisis, unspecified: Secondary | ICD-10-CM | POA: Insufficient documentation

## 2016-01-05 DIAGNOSIS — Z87891 Personal history of nicotine dependence: Secondary | ICD-10-CM | POA: Insufficient documentation

## 2016-01-05 LAB — CBC WITH DIFFERENTIAL/PLATELET
BASOS ABS: 0 10*3/uL (ref 0.0–0.1)
Basophils Relative: 0 %
EOS ABS: 0.1 10*3/uL (ref 0.0–0.7)
EOS PCT: 1 %
HCT: 32.2 % — ABNORMAL LOW (ref 39.0–52.0)
HEMOGLOBIN: 11.9 g/dL — AB (ref 13.0–17.0)
LYMPHS ABS: 2.4 10*3/uL (ref 0.7–4.0)
Lymphocytes Relative: 25 %
MCH: 29 pg (ref 26.0–34.0)
MCHC: 37 g/dL — ABNORMAL HIGH (ref 30.0–36.0)
MCV: 78.3 fL (ref 78.0–100.0)
Monocytes Absolute: 0.9 10*3/uL (ref 0.1–1.0)
Monocytes Relative: 9 %
NEUTROS PCT: 66 %
Neutro Abs: 6.4 10*3/uL (ref 1.7–7.7)
PLATELETS: 151 10*3/uL (ref 150–400)
RBC: 4.11 MIL/uL — AB (ref 4.22–5.81)
RDW: 13.8 % (ref 11.5–15.5)
WBC: 9.8 10*3/uL (ref 4.0–10.5)

## 2016-01-05 LAB — COMPREHENSIVE METABOLIC PANEL
ALBUMIN: 4.1 g/dL (ref 3.5–5.0)
ALK PHOS: 72 U/L (ref 38–126)
ALT: 16 U/L — ABNORMAL LOW (ref 17–63)
ANION GAP: 7 (ref 5–15)
AST: 30 U/L (ref 15–41)
BUN: 7 mg/dL (ref 6–20)
CALCIUM: 9.1 mg/dL (ref 8.9–10.3)
CO2: 25 mmol/L (ref 22–32)
Chloride: 108 mmol/L (ref 101–111)
Creatinine, Ser: 0.87 mg/dL (ref 0.61–1.24)
GFR calc Af Amer: >60 mL/min (ref >60)
GFR calc non Af Amer: >60 mL/min (ref >60)
GLUCOSE: 153 mg/dL — AB (ref 65–99)
Potassium: 3.7 mmol/L (ref 3.5–5.1)
SODIUM: 140 mmol/L (ref 135–145)
Total Bilirubin: 1.7 mg/dL — ABNORMAL HIGH (ref 0.3–1.2)
Total Protein: 6.9 g/dL (ref 6.5–8.1)

## 2016-01-05 LAB — RETICULOCYTES
RBC.: 4.2 MIL/uL — AB (ref 4.22–5.81)
RETIC COUNT ABSOLUTE: 205.8 10*3/uL — AB (ref 19.0–186.0)
RETIC CT PCT: 4.9 % — AB (ref 0.4–3.1)

## 2016-01-05 MED ORDER — HYDROMORPHONE HCL 1 MG/ML IJ SOLN
1.0000 mg | Freq: Once | INTRAMUSCULAR | Status: AC
Start: 2016-01-05 — End: 2016-01-05
  Administered 2016-01-05: 1 mg via INTRAVENOUS
  Filled 2016-01-05: qty 1

## 2016-01-05 MED ORDER — KETOROLAC TROMETHAMINE 30 MG/ML IJ SOLN
30.0000 mg | INTRAMUSCULAR | Status: AC
  Administered 2016-01-05: 30 mg via INTRAVENOUS
  Filled 2016-01-05: qty 1

## 2016-01-05 MED ORDER — HYDROMORPHONE HCL 1 MG/ML IJ SOLN
1.0000 mg | Freq: Once | INTRAMUSCULAR | Status: AC
  Administered 2016-01-05: 1 mg via INTRAVENOUS
  Filled 2016-01-05: qty 1

## 2016-01-05 MED ORDER — DEXTROSE-NACL 5-0.45 % IV SOLN
INTRAVENOUS | Status: DC
  Administered 2016-01-05: 14:00:00 via INTRAVENOUS

## 2016-01-05 NOTE — Telephone Encounter (Signed)
Patient C/O pain to legs and back.  Patient rates pain 8/10 on pain scale.  Patient denies chest pain, shortness of breath or difficulty breathing.  Patient denies N/D/V or abdominal pain.  Patient has taking oxycodone and 800mg  ibuprofen around 0200am.  Patient has taken anything else this morning. I advised I would notify the physician and give him a call back.  Patient verbalizes understanding.

## 2016-01-05 NOTE — ED Provider Notes (Signed)
[facility] Provider Note   CSN: [id number] Arrival date & time: 01/05/16  1331  First Provider Contact:  First MD Initiated Contact with Patient 01/05/16 1255        History   Chief Complaint Chief Complaint  Patient presents with  . Sickle Cell Pain Crisis    HPI William Jennings is a 25 y.o. male.  HPI William Jennings is a 25 y.o. male with PMH significant for sickle cell disease who presents with Gradual onset, constant, severe lower exttremity pain and low back pain consistent with previous sickle cell exacerbations. Patient reports taking 800 mg ibuprofen and oxycodone without relief. Last dose ibuprofen this morning, last dose oxycodone yesterday. He reports this pain began in his left leg now is both of his legs along with his lower back. He denies any injury or trauma. He denies any chest pain, shortness of breath, abdominal pain, fever, cough, or testicular pain. Exacerbating factors include palpation and walking. No modifying factors. He states he called the sickle cell clinic today, but since it was after 1 PM they told him to follow up tomorrow.  Past Medical History:  Diagnosis Date  . Sickle cell disease [facility])     Patient Active Problem List   Diagnosis Date Noted  . Acute chest syndrome due to sickle cell crisis (HCC)   . Sickle cell anemia with crisis (HCC) 02/21/2014  . Sickle cell anemia (HCC) 08/10/2013  . Chest pain 08/10/2013  . Dehydration 08/08/2013  . Sickle cell crisis (HCC) 08/06/2013  . 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 CHOLECYSTECTOMY         Home Medications    Prior to Admission medications   Medication Sig Start Date End Date Taking? Authorizing Provider  folic acid (FOLVITE) 800 MCG tablet Take 1 tablet (800 mcg total) by mouth daily. 02/21/14   Massie Maroon, FNP  ibuprofen (ADVIL,MOTRIN) 800 MG tablet  Take 1 tablet (800 mg total) by mouth every 8 (eight) hours as needed for mild pain or moderate pain. 10/17/15   Quentin Angst, MD    Family History History reviewed. No pertinent family history.  Social History Social History  Substance Use Topics  . Smoking status: Former Games developer  . Smokeless tobacco: Never Used  . Alcohol use Yes     Allergies   Shellfish allergy   Review of Systems Review of Systems All other systems negative unless otherwise stated in HPI   Physical Exam Updated Vital Signs BP 115/70 (BP Location: Left Arm)   Pulse 75   Temp 97.8 F (36.6 C) (Oral)   Resp 18   Ht  (1.727 m)   Wt 65.8 kg   SpO2 98%   BMI 22.05 kg/m   Physical Exam  Constitutional: He is oriented to person, place, and time. He appears well-developed and well-nourished.  Non-toxic appearance. He does not have a sickly appearance. He does not appear ill.  HENT:  Head: Normocephalic and atraumatic.  Mouth/Throat: Oropharynx is clear and moist.  Eyes: Conjunctivae are normal.  Neck: Normal range of motion. Neck supple.  Cardiovascular: Normal rate, regular rhythm and intact distal pulses.   Pulses:      Radial pulses are 2+ on the right side, and 2+ on the left side.       Dorsalis pedis pulses are 2+ on the right side, and  2+ on the left side.  Pulmonary/Chest: Effort normal and breath sounds normal. No accessory muscle usage or stridor. No respiratory distress. He has no wheezes. He has no rhonchi. He has no rales.  Abdominal: Soft. Bowel sounds are normal. He exhibits no distension and no mass. There is no tenderness. There is no guarding.  Musculoskeletal: Normal range of motion. He exhibits tenderness. He exhibits no edema.  Lymphadenopathy:    He has no cervical adenopathy.  Neurological: He is alert and oriented to person, place, and time.  Speech clear without dysarthria.  Skin: Skin is warm and dry. No pallor.  Psychiatric: He has a normal mood and affect. His  behavior is normal.     ED Treatments / Results  Labs (all labs ordered are listed, but only abnormal results are displayed) Labs Reviewed  CBC WITH DIFFERENTIAL/PLATELET - Abnormal; Notable for the following:       Result Value   RBC 4.11 (*)    Hemoglobin 11.9 (*)    HCT 32.2 (*)    MCHC 37.0 (*)    All other components within normal limits  RETICULOCYTES - Abnormal; Notable for the following:    Retic Ct Pct 4.9 (*)    RBC. 4.20 (*)    Retic Count, Manual 205.8 (*)    All other components within normal limits  COMPREHENSIVE METABOLIC PANEL - Abnormal; Notable for the following:    Glucose, Bld 153 (*)    ALT 16 (*)    Total Bilirubin 1.7 (*)    All other components within normal limits    EKG  EKG Interpretation None       Radiology No results found.  Procedures Procedures (including critical care time)  Medications Ordered in ED Medications  dextrose 5 %-0.45 % sodium chloride infusion ( Intravenous Rate/Dose Change 01/05/16 1422)  ketorolac (TORADOL) 30 MG/ML injection 30 mg (30 mg Intravenous Given 01/05/16 1419)  HYDROmorphone (DILAUDID) injection 1 mg (1 mg Intravenous Given 01/05/16 1419)  HYDROmorphone (DILAUDID) injection 1 mg (1 mg Intravenous Given 01/05/16 1520)  HYDROmorphone (DILAUDID) injection 1 mg (1 mg Intravenous Given 01/05/16 1603)     Initial Impression / Assessment and Plan / ED Course  I have reviewed the triage vital signs and the nursing notes.  Pertinent labs & imaging results that were available during my care of the patient were reviewed by me and considered in my medical decision making (see chart for details).  Clinical Course  Comment By Time  Continued pain, will give 2nd dose Diluadid  Cheri Fowler, PA-C 07/24 1451   Patient presents with sickle cell crisis. No chest pain, shortness breath or abdominal pain. VSS, NAD. Patient appears well, nontoxic or septic. Presentation consistent with acute chest syndrome. We'll give fluids,  pain control, and obtain labs.  Retic count 205.8, hgb stable, 11.9, elevated bili 1.7, but not elevation of liver enzymes.  Patient with improvement after 3 doses 1 mg dilaudid and toradol.  Patient has 800 mg ibuprofen and oxycodone at home.  Follow up [facility].  Return precautions discussed.  Patient agrees and acknowledges the above plan for discharge.  Case has been discussed with Dr. Fayrene Fearing who agrees with the above plan for discharge.    Final Clinical Impressions(s) / ED Diagnoses   Final diagnoses:  Sickle cell pain crisis [facility])    New Prescriptions New Prescriptions   No medications on file     Gwinda Maine 01/05/16 1644    Rolland Porter, MD 01/18/16  1935  

## 2016-01-05 NOTE — ED Triage Notes (Signed)
Pt c/o "sickle cell crisis"  X 3 days

## 2016-01-05 NOTE — Telephone Encounter (Signed)
Spoke with Dr. Hyman Hopes.  Advised patient to take pain medication as prescribed, increase fluids, and rest.  If pain is not improved, call in the morning for triage.  / Patient then states he has been doing this for 3 days and it has not improved. / Placed caller on hold and call Dr. Hyman Hopes back and discussed. / Advised patient it is ok to come to [facility]  Patient verbalizes understanding.

## 2016-01-05 NOTE — Telephone Encounter (Signed)
Called patient back since he had not arrived for treatment.  Patient states he had to get his kids ready. I explained that he needed to be here before 1p.m.  Patient states he couldn't make that time as his has to get kids to childcare.  I advised patient to call in the morning for triage.  Advised patient to have childcare and transportation already arranged.  Patient is in agreement.

## 2016-03-08 ENCOUNTER — Encounter (HOSPITAL_BASED_OUTPATIENT_CLINIC_OR_DEPARTMENT_OTHER): Payer: Self-pay | Admitting: *Deleted

## 2016-03-08 ENCOUNTER — Emergency Department (HOSPITAL_BASED_OUTPATIENT_CLINIC_OR_DEPARTMENT_OTHER)
Admission: EM | Admit: 2016-03-08 | Discharge: 2016-03-08 | Disposition: A | Discharge status: Home / Self Care | Payer: Self-pay | Attending: Emergency Medicine | Admitting: Emergency Medicine

## 2016-03-08 ENCOUNTER — Emergency Department (HOSPITAL_BASED_OUTPATIENT_CLINIC_OR_DEPARTMENT_OTHER): Payer: Self-pay

## 2016-03-08 DIAGNOSIS — Z79899 Other long term (current) drug therapy: Secondary | ICD-10-CM | POA: Insufficient documentation

## 2016-03-08 DIAGNOSIS — D5700 Hb-SS disease with crisis, unspecified: Secondary | ICD-10-CM | POA: Insufficient documentation

## 2016-03-08 DIAGNOSIS — Z87891 Personal history of nicotine dependence: Secondary | ICD-10-CM | POA: Insufficient documentation

## 2016-03-08 DIAGNOSIS — Z5181 Encounter for therapeutic drug level monitoring: Secondary | ICD-10-CM | POA: Insufficient documentation

## 2016-03-08 LAB — CBC WITH DIFFERENTIAL/PLATELET
BASOS ABS: 0 10*3/uL (ref 0.0–0.1)
BASOS PCT: 0 %
EOS ABS: 0.1 10*3/uL (ref 0.0–0.7)
EOS PCT: 1 %
HCT: 31 % — ABNORMAL LOW (ref 39.0–52.0)
Hemoglobin: 11.4 g/dL — ABNORMAL LOW (ref 13.0–17.0)
Lymphocytes Relative: 33 %
Lymphs Abs: 2.9 10*3/uL (ref 0.7–4.0)
MCH: 28.3 pg (ref 26.0–34.0)
MCHC: 36.8 g/dL — ABNORMAL HIGH (ref 30.0–36.0)
MCV: 76.9 fL — AB (ref 78.0–100.0)
MONO ABS: 0.9 10*3/uL (ref 0.1–1.0)
Monocytes Relative: 11 %
Neutro Abs: 4.9 10*3/uL (ref 1.7–7.7)
Neutrophils Relative %: 56 %
PLATELETS: 208 10*3/uL (ref 150–400)
RBC: 4.03 MIL/uL — ABNORMAL LOW (ref 4.22–5.81)
RDW: 14.3 % (ref 11.5–15.5)
WBC: 8.9 10*3/uL (ref 4.0–10.5)

## 2016-03-08 LAB — COMPREHENSIVE METABOLIC PANEL
ALBUMIN: 4.2 g/dL (ref 3.5–5.0)
ALK PHOS: 78 U/L (ref 38–126)
ALT: 12 U/L — ABNORMAL LOW (ref 17–63)
ANION GAP: 7 (ref 5–15)
AST: 23 U/L (ref 15–41)
BILIRUBIN TOTAL: 1.5 mg/dL — AB (ref 0.3–1.2)
BUN: 8 mg/dL (ref 6–20)
CALCIUM: 9 mg/dL (ref 8.9–10.3)
CO2: 25 mmol/L (ref 22–32)
Chloride: 107 mmol/L (ref 101–111)
Creatinine, Ser: 0.86 mg/dL (ref 0.61–1.24)
GFR calc Af Amer: >60 mL/min (ref >60)
GFR calc non Af Amer: >60 mL/min (ref >60)
GLUCOSE: 128 mg/dL — AB (ref 65–99)
Potassium: 3.6 mmol/L (ref 3.5–5.1)
Sodium: 139 mmol/L (ref 135–145)
TOTAL PROTEIN: 7.2 g/dL (ref 6.5–8.1)

## 2016-03-08 LAB — PROTIME-INR
INR: 1.19
PROTHROMBIN TIME: 15.2 s (ref 11.4–15.2)

## 2016-03-08 LAB — URINE MICROSCOPIC-ADD ON

## 2016-03-08 LAB — URINALYSIS, ROUTINE W REFLEX MICROSCOPIC
Bilirubin Urine: NEGATIVE
GLUCOSE, UA: NEGATIVE mg/dL
HGB URINE DIPSTICK: NEGATIVE
KETONES UR: NEGATIVE mg/dL
Nitrite: NEGATIVE
PROTEIN: NEGATIVE mg/dL
Specific Gravity, Urine: 1.013 (ref 1.005–1.030)
pH: 7 (ref 5.0–8.0)

## 2016-03-08 LAB — I-STAT CG4 LACTIC ACID, ED: LACTIC ACID, VENOUS: 0.79 mmol/L (ref 0.5–1.9)

## 2016-03-08 LAB — RETICULOCYTES
RBC.: 4.06 MIL/uL — ABNORMAL LOW (ref 4.22–5.81)
RETIC CT PCT: 5.7 % — AB (ref 0.4–3.1)
Retic Count, Absolute: 231.4 10*3/uL — ABNORMAL HIGH (ref 19.0–186.0)

## 2016-03-08 MED ORDER — KETOROLAC TROMETHAMINE 30 MG/ML IJ SOLN
30.0000 mg | Freq: Once | INTRAMUSCULAR | Status: AC
  Administered 2016-03-08: 30 mg via INTRAVENOUS
  Filled 2016-03-08: qty 1

## 2016-03-08 MED ORDER — HYDROMORPHONE HCL 1 MG/ML IJ SOLN
1.0000 mg | Freq: Once | INTRAMUSCULAR | Status: AC | PRN
  Administered 2016-03-08: 1 mg via INTRAVENOUS
  Filled 2016-03-08: qty 1

## 2016-03-08 MED ORDER — HYDROMORPHONE HCL 1 MG/ML IJ SOLN
1.0000 mg | Freq: Once | INTRAMUSCULAR | Status: AC
  Administered 2016-03-08: 1 mg via INTRAVENOUS
  Filled 2016-03-08: qty 1

## 2016-03-08 MED ORDER — SODIUM CHLORIDE 0.45 % IV SOLN
INTRAVENOUS | Status: DC
  Administered 2016-03-08: 14:00:00 via INTRAVENOUS

## 2016-03-08 MED ORDER — ONDANSETRON HCL 4 MG/2ML IJ SOLN
4.0000 mg | Freq: Once | INTRAMUSCULAR | Status: AC
  Administered 2016-03-08: 4 mg via INTRAVENOUS
  Filled 2016-03-08: qty 2

## 2016-03-08 NOTE — ED Triage Notes (Signed)
Sickle cell pain crisis x 3 days. Bilateral leg and right arm pain.denies CP

## 2016-03-08 NOTE — ED Provider Notes (Signed)
[facility] Provider Note   CSN: [id number] Arrival date & time: 03/08/16  1158     History   Chief Complaint Chief Complaint  Patient presents with  . Sickle Cell Pain Crisis    HPI William Jennings is a 25 y.o. male.  HPI Patient with known sickle cell disease comes in with three-day history of pain.  Been taking his medication but somewhat helping.  Denies fever or chills.  He has bilateral leg and arm pain denies chest pain.  Denies headache.  Denies Donald pain. Past Medical History:  Diagnosis Date  . Sickle cell disease [facility](HCC)     Patient Active Problem List   Diagnosis Date Noted  . Acute chest syndrome due to sickle cell crisis (HCC)   . Sickle cell anemia with crisis (HCC) 02/21/2014  . Sickle cell anemia (HCC) 08/10/2013  . Chest pain 08/10/2013  . Dehydration 08/08/2013  . Sickle cell crisis (HCC) 08/06/2013  . 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 CHOLECYSTECTOMY         Home Medications    Prior to Admission medications   Medication Sig Start Date End Date Taking? Authorizing Provider  folic acid (FOLVITE) 800 MCG tablet Take 1 tablet (800 mcg total) by mouth daily. 02/21/14   Massie MaroonLachina M Hollis, FNP  ibuprofen (ADVIL,MOTRIN) 800 MG tablet Take 1 tablet (800 mg total) by mouth every 8 (eight) hours as needed for mild pain or moderate pain. 10/17/15   Quentin Angstlugbemiga E Jegede, MD    Family History History reviewed. No pertinent family history.  Social History Social History  Substance Use Topics  . Smoking status: Former Games developermoker  . Smokeless tobacco: Never Used  . Alcohol use Yes     Allergies   Shellfish allergy   Review of Systems Review of Systems All other systems reviewed and are negative  Physical Exam Updated Vital Signs BP 104/56   Pulse 74   Temp 98.1 F (36.7 C) (Oral)   Resp 18   Ht 5\' 8"  (1.727 m)   Wt 145  lb (65.8 kg)   SpO2 99%   BMI 22.05 kg/m   Physical Exam Physical Exam  Nursing note and vitals reviewed. Constitutional: He is oriented to person, place, and time. He appears well-developed and well-nourished. No distress.  HENT:  Head: Normocephalic and atraumatic.  Eyes: Pupils are equal, round, and reactive to light.  Neck: Normal range of motion.  Cardiovascular: Normal rate and intact distal pulses.   Pulmonary/Chest: No respiratory distress.  Abdominal: Normal appearance. He exhibits no distension.  Musculoskeletal: Normal range of motion.  Neurological: He is alert and oriented to person, place, and time. No cranial nerve deficit.  Skin: Skin is warm and dry. No rash noted.  Psychiatric: He has a normal mood and affect. His behavior is normal.    ED Treatments / Results  Labs (all labs ordered are listed, but only abnormal results are displayed) Labs Reviewed  CBC WITH DIFFERENTIAL/PLATELET - Abnormal; Notable for the following:       Result Value   RBC 4.03 (*)    Hemoglobin 11.4 (*)    HCT 31.0 (*)    MCV 76.9 (*)    MCHC 36.8 (*)    All other components within normal limits  RETICULOCYTES - Abnormal; Notable for the following:    Retic Ct Pct 5.7 (*)  RBC. 4.06 (*)    Retic Count, Manual 231.4 (*)    All other components within normal limits  COMPREHENSIVE METABOLIC PANEL - Abnormal; Notable for the following:    Glucose, Bld 128 (*)    ALT 12 (*)    Total Bilirubin 1.5 (*)    All other components within normal limits  URINALYSIS, ROUTINE W REFLEX MICROSCOPIC (NOT AT [facility]) - Abnormal; Notable for the following:    Leukocytes, UA SMALL (*)    All other components within normal limits  URINE MICROSCOPIC-ADD ON - Abnormal; Notable for the following:    Squamous Epithelial / LPF 0-5 (*)    Bacteria, UA RARE (*)    All other components within normal limits  PROTIME-INR  I-STAT CG4 LACTIC ACID, ED  I-STAT CG4 LACTIC ACID, ED    EKG  EKG  Interpretation  Date/Time:  Monday March 08 2016 13:38:23 EDT Ventricular Rate:  82 PR Interval:    QRS Duration: 87 QT Interval:  338 QTC Calculation: 395 R Axis:   74 Text Interpretation:  Sinus rhythm Nonspecific T abnormalities, lateral leads Borderline ST elevation, anterior leads No significant change since last tracing Confirmed by Infiniti Hoefling  MD, Jamieson Hetland ([id number]) on 03/08/2016 2:31:47 PM       Radiology No results found.  Procedures Procedures (including critical care time)  Medications Ordered in ED Medications  HYDROmorphone (DILAUDID) injection 1 mg (1 mg Intravenous Given 03/08/16 1356)  ondansetron (ZOFRAN) injection 4 mg (4 mg Intravenous Given 03/08/16 1356)  HYDROmorphone (DILAUDID) injection 1 mg (1 mg Intravenous Given 03/08/16 1522)  ketorolac (TORADOL) 30 MG/ML injection 30 mg (30 mg Intravenous Given 03/08/16 1624)  HYDROmorphone (DILAUDID) injection 1 mg (1 mg Intravenous Given 03/08/16 1738)     Initial Impression / Assessment and Plan / ED Course  I have reviewed the triage vital signs and the nursing notes.  Pertinent labs & imaging results that were available during my care of the patient were reviewed by me and considered in my medical decision making (see chart for details).  Clinical Course      Final Clinical Impressions(s) / ED Diagnoses   Final diagnoses:  Sickle cell pain crisis [facility])    New Prescriptions Discharge Medication List as of 03/08/2016  7:01 PM       Nelva Nay, MD 03/14/16 [phone]

## 2016-03-08 NOTE — ED Notes (Signed)
Pt states he feels he can be d/c home. Dr. Mora Bellmanni notified

## 2016-03-08 NOTE — ED Provider Notes (Signed)
Patient signed out to me as pending pain control for sickle cell. He had received 2 doses of dilaudid.  I added toradol to the treatment.  Patient required a 3rd dose of dilaudid and he now feels comfortable going home.  He has pain meds at home.  Advised to call PCP within 3 days for follow up appt.  He appears well and in NAD.  VS remain within his normal limits and he is safe for DC.   Tomasita CrumbleAdeleke Baby Gieger, MD 03/08/16 1902

## 2016-04-02 ENCOUNTER — Encounter (HOSPITAL_BASED_OUTPATIENT_CLINIC_OR_DEPARTMENT_OTHER): Payer: Self-pay | Admitting: *Deleted

## 2016-04-02 ENCOUNTER — Emergency Department (HOSPITAL_BASED_OUTPATIENT_CLINIC_OR_DEPARTMENT_OTHER)
Admission: EM | Admit: 2016-04-02 | Discharge: 2016-04-02 | Disposition: A | Discharge status: Home / Self Care | Payer: Self-pay | Attending: Emergency Medicine | Admitting: Emergency Medicine

## 2016-04-02 DIAGNOSIS — Z87891 Personal history of nicotine dependence: Secondary | ICD-10-CM | POA: Insufficient documentation

## 2016-04-02 DIAGNOSIS — D5700 Hb-SS disease with crisis, unspecified: Secondary | ICD-10-CM | POA: Insufficient documentation

## 2016-04-02 LAB — COMPREHENSIVE METABOLIC PANEL
ALT: 17 U/L (ref 17–63)
AST: 27 U/L (ref 15–41)
Albumin: 4 g/dL (ref 3.5–5.0)
Alkaline Phosphatase: 73 U/L (ref 38–126)
Anion gap: 5 (ref 5–15)
BILIRUBIN TOTAL: 1.5 mg/dL — AB (ref 0.3–1.2)
BUN: 6 mg/dL (ref 6–20)
CALCIUM: 8.9 mg/dL (ref 8.9–10.3)
CHLORIDE: 109 mmol/L (ref 101–111)
CO2: 24 mmol/L (ref 22–32)
CREATININE: 0.8 mg/dL (ref 0.61–1.24)
Glucose, Bld: 104 mg/dL — ABNORMAL HIGH (ref 65–99)
Potassium: 4.1 mmol/L (ref 3.5–5.1)
Sodium: 138 mmol/L (ref 135–145)
TOTAL PROTEIN: 7.2 g/dL (ref 6.5–8.1)

## 2016-04-02 LAB — CBC WITH DIFFERENTIAL/PLATELET
BASOS ABS: 0 10*3/uL (ref 0.0–0.1)
Basophils Relative: 0 %
EOS ABS: 0 10*3/uL (ref 0.0–0.7)
Eosinophils Relative: 0 %
HCT: 32.4 % — ABNORMAL LOW (ref 39.0–52.0)
Hemoglobin: 11.8 g/dL — ABNORMAL LOW (ref 13.0–17.0)
LYMPHS ABS: 3.5 10*3/uL (ref 0.7–4.0)
Lymphocytes Relative: 28 %
MCH: 27.9 pg (ref 26.0–34.0)
MCHC: 36.4 g/dL — ABNORMAL HIGH (ref 30.0–36.0)
MCV: 76.6 fL — AB (ref 78.0–100.0)
MONO ABS: 1.4 10*3/uL — AB (ref 0.1–1.0)
Monocytes Relative: 11 %
NEUTROS ABS: 7.5 10*3/uL (ref 1.7–7.7)
NEUTROS PCT: 61 %
PLATELETS: 227 10*3/uL (ref 150–400)
RBC: 4.23 MIL/uL (ref 4.22–5.81)
RDW: 15.6 % — AB (ref 11.5–15.5)
WBC: 12.4 10*3/uL — AB (ref 4.0–10.5)
nRBC: 3 /100 WBC — ABNORMAL HIGH

## 2016-04-02 LAB — RETICULOCYTES
RBC.: 4.28 MIL/uL (ref 4.22–5.81)
RETIC CT PCT: 4.9 % — AB (ref 0.4–3.1)
Retic Count, Absolute: 209.7 10*3/uL — ABNORMAL HIGH (ref 19.0–186.0)

## 2016-04-02 LAB — I-STAT CG4 LACTIC ACID, ED: Lactic Acid, Venous: 1 mmol/L (ref 0.5–1.9)

## 2016-04-02 MED ORDER — HYDROMORPHONE HCL 1 MG/ML IJ SOLN
1.0000 mg | Freq: Once | INTRAMUSCULAR | Status: AC
  Administered 2016-04-02: 1 mg via INTRAVENOUS
  Filled 2016-04-02: qty 1

## 2016-04-02 MED ORDER — SODIUM CHLORIDE 0.9 % IV BOLUS (SEPSIS)
1000.0000 mL | Freq: Once | INTRAVENOUS | Status: AC
  Administered 2016-04-02: 1000 mL via INTRAVENOUS

## 2016-04-02 MED ORDER — KETOROLAC TROMETHAMINE 30 MG/ML IJ SOLN
30.0000 mg | Freq: Once | INTRAMUSCULAR | Status: AC
  Administered 2016-04-02: 30 mg via INTRAVENOUS
  Filled 2016-04-02: qty 1

## 2016-04-02 NOTE — ED Provider Notes (Signed)
[facility]   CSN: [id number] Arrival date & time: 04/02/16  1346  By signing my name below, I, Modena Jansky, attest that this documentation has been prepared under the direction and in the presence of non-physician practitioner, Rise Mu, PA-C. Electronically Signed: Modena Jansky, Scribe. 04/02/2016. 4:10 PM.  History   Chief Complaint Chief Complaint  Patient presents with  . Sickle Cell Pain Crisis   The history is provided by the patient and medical records. No language interpreter was used.   HPI Comments: William Jennings is a 25 y.o. male with a hx of sickle cell disease who presents to the Emergency Department complaining of  that started about 3 days ago. Per medical record, pt was seen in the ED on 03/08/16 for sickle cell pain. He reports the pain is similar to past episodes, which were resolved with Dilaudid. He states this morning he tried to manage the pain on his own so he did not got to a sickle cell clinic PTA. He reports associated symptoms of RLE pain, RUE pain, and back pain. He describes the pain as constant and moderate without any aggravating factors. He states that he has been taking ibuprofen, tylenol, and oxycodone without relief. He denies any recent trauma, headaches, nausea, vomiting, fever, chest pain, SOB, abdominal pain, dysuria, or other complaints.   Past Medical History:  Diagnosis Date  . Sickle cell disease [facility])     Patient Active Problem List   Diagnosis Date Noted  . Acute chest syndrome due to sickle cell crisis (HCC)   . Sickle cell anemia with crisis (HCC) 02/21/2014  . Sickle cell anemia (HCC) 08/10/2013  . Chest pain 08/10/2013  . Dehydration 08/08/2013  . Sickle cell crisis (HCC) 08/06/2013  . 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 CHOLECYSTECTOMY         Home Medications    Prior  to Admission medications   Medication Sig Start Date End Date Taking? Authorizing Provider  folic acid (FOLVITE) 800 MCG tablet Take 1 tablet (800 mcg total) by mouth daily. 02/21/14   Massie Maroon, FNP  ibuprofen (ADVIL,MOTRIN) 800 MG tablet Take 1 tablet (800 mg total) by mouth every 8 (eight) hours as needed for mild pain or moderate pain. 10/17/15   Quentin Angst, MD    Family History No family history on file.  Social History Social History  Substance Use Topics  . Smoking status: Former Games developer  . Smokeless tobacco: Never Used  . Alcohol use Yes     Allergies   Shellfish allergy   Review of Systems Review of Systems  Constitutional: Negative for chills and fever.  HENT: Negative for congestion and rhinorrhea.   Respiratory: Negative for cough and shortness of breath.   Cardiovascular: Negative for chest pain and leg swelling.  Gastrointestinal: Negative for abdominal pain, diarrhea, nausea and vomiting.  Genitourinary: Negative for dysuria, frequency and urgency.  Musculoskeletal: Positive for back pain and myalgias (RLE, RUE).  Skin: Negative.   Neurological: Negative for dizziness, syncope, weakness, numbness and headaches.  All other systems reviewed and are negative.    Physical Exam Updated Vital Signs BP 128/82 (BP Location: Left Arm)   Pulse (!) 58   Temp 98.3 F (36.8 C) (Oral)   Resp 16   Ht 5\' 8"  (1.727 m)   Wt 145 lb (65.8 kg)  SpO2 100%   BMI 22.05 kg/m   Physical Exam  Constitutional: He appears well-developed and well-nourished. No distress.  HENT:  Head: Normocephalic and atraumatic.  Nose: Nose normal.  Mouth/Throat: Uvula is midline, oropharynx is clear and moist and mucous membranes are normal.  Eyes: Conjunctivae are normal. Pupils are equal, round, and reactive to light.  Neck: Normal range of motion. Neck supple. No thyromegaly present.  Cardiovascular: Normal rate, regular rhythm, normal heart sounds and intact distal  pulses.  Exam reveals no gallop and no friction rub.   No murmur heard. Pulmonary/Chest: Effort normal. No respiratory distress. He has no wheezes. He has no rales.  Abdominal: Soft. Bowel sounds are normal. There is no tenderness. There is no rebound and no guarding.  Musculoskeletal: Normal range of motion. He exhibits no edema, tenderness or deformity.  Mild TTP to anterior aspect of right thigh and right knee. TTP of the right forearm. Full ROM. Strength and sensation intact. Distal pulses intact. No ttp of the lower back. No midline tenderness or paraspinal tenderness. Patient with full rom.   Lymphadenopathy:    He has no cervical adenopathy.  Neurological: He is alert.  Skin: Skin is warm and dry. Capillary refill takes less than 2 seconds.  Psychiatric: He has a normal mood and affect.  Nursing note and vitals reviewed.    ED Treatments / Results  DIAGNOSTIC STUDIES: Oxygen Saturation is 100% on RA, normal by my interpretation.    COORDINATION OF CARE: 4:15 PM- Pt advised of plan for treatment and pt agrees.  Labs (all labs ordered are listed, but only abnormal results are displayed) Labs Reviewed  COMPREHENSIVE METABOLIC PANEL - Abnormal; Notable for the following:       Result Value   Glucose, Bld 104 (*)    Total Bilirubin 1.5 (*)    All other components within normal limits  CBC WITH DIFFERENTIAL/PLATELET - Abnormal; Notable for the following:    WBC 12.4 (*)    Hemoglobin 11.8 (*)    HCT 32.4 (*)    MCV 76.6 (*)    MCHC 36.4 (*)    RDW 15.6 (*)    nRBC 3 (*)    Monocytes Absolute 1.4 (*)    All other components within normal limits  RETICULOCYTES - Abnormal; Notable for the following:    Retic Ct Pct 4.9 (*)    Retic Count, Manual 209.7 (*)    All other components within normal limits  I-STAT CG4 LACTIC ACID, ED    EKG  EKG Interpretation None       Radiology No results found.  Procedures Procedures (including critical care time)  Medications  Ordered in ED Medications  sodium chloride 0.9 % bolus 1,000 mL (0 mLs Intravenous Stopped 04/02/16 1741)  HYDROmorphone (DILAUDID) injection 1 mg (1 mg Intravenous Given 04/02/16 1626)  HYDROmorphone (DILAUDID) injection 1 mg (1 mg Intravenous Given 04/02/16 1756)  ketorolac (TORADOL) 30 MG/ML injection 30 mg (30 mg Intravenous Given 04/02/16 1738)  sodium chloride 0.9 % bolus 1,000 mL (0 mLs Intravenous Stopped 04/02/16 1921)     Initial Impression / Assessment and Plan / ED Course  I have reviewed the triage vital signs and the nursing notes.  Pertinent labs & imaging results that were available during my care of the patient were reviewed by me and considered in my medical decision making (see chart for details).  Clinical Course  Patient was supposed to ED with sickle cell pain. Patient states that his pain  is similar to sickle crisis in the past. Patient states that he was not able to make it to the sickle cell clinic and time today. States that he is usually able to manage his pain at home. Presentation consistent with acute chest syndrome. Patient denies any chest pain, shortness of breath, abdominal pain. His vital signs are stable. He is nontoxic appearing. Patient appears well lying in bed. Exam remarkable for right arm and leg pain. Patient was given fluids and pain medicine in ED. Patient's retic count was noted to be 209. Hemoglobin stable at 11.9. Mild leukocytosis noted. Patient is afebrile and not tachycardic. Lungs are clear to auscultation bilaterally. Patients pain was improved with 2 doses of Dilaudid and Toradol. Patient states that he is ready to be discharged. He will continue taking his ibuprofen and oxycodone at home as needed. He'll follow-up with the sickle cell clinic on Monday. He was given strict return precautions. He is hemodynamically stable. Patient discussed with Dr. Deeann CreeYelton who agrees with the above plan. Patient was understanding with plan. Discharged home in no  acute distress with stable vital signs.   Final Clinical Impressions(s) / ED Diagnoses   Final diagnoses:  Sickle cell crisis [facility](HCC)    New Prescriptions Discharge Medication List as of 04/02/2016  7:27 PM     I personally performed the services described in this documentation, which was scribed in my presence. The recorded information has been reviewed and is accurate.     Rise MuKenneth T Leaphart, PA-C 04/03/16 0205    Loren Raceravid Yelverton, MD 04/09/16 [phone]

## 2016-04-02 NOTE — ED Triage Notes (Signed)
States he has been having a sickle cell crisis for the past 3 days. He has been taking Ibuprofen.

## 2016-04-02 NOTE — Discharge Instructions (Signed)
All of your labs have been re assuring. You need to follow up with the Sickle cell clinic this week. Please return to the ED if your symptoms worsen or fail to improve or if you develop chest pain, sob, abd pain, nausea, emesis. You may continue to take your medication at home for pain.

## 2016-04-03 ENCOUNTER — Encounter (HOSPITAL_BASED_OUTPATIENT_CLINIC_OR_DEPARTMENT_OTHER): Payer: Self-pay | Admitting: Emergency Medicine

## 2016-04-03 ENCOUNTER — Emergency Department (HOSPITAL_BASED_OUTPATIENT_CLINIC_OR_DEPARTMENT_OTHER): Payer: Self-pay

## 2016-04-03 ENCOUNTER — Emergency Department (HOSPITAL_BASED_OUTPATIENT_CLINIC_OR_DEPARTMENT_OTHER)
Admission: EM | Admit: 2016-04-03 | Discharge: 2016-04-04 | Disposition: A | Discharge status: Home / Self Care | Payer: Self-pay | Attending: Emergency Medicine | Admitting: Emergency Medicine

## 2016-04-03 DIAGNOSIS — Z87891 Personal history of nicotine dependence: Secondary | ICD-10-CM | POA: Insufficient documentation

## 2016-04-03 DIAGNOSIS — Z791 Long term (current) use of non-steroidal anti-inflammatories (NSAID): Secondary | ICD-10-CM | POA: Insufficient documentation

## 2016-04-03 DIAGNOSIS — D5700 Hb-SS disease with crisis, unspecified: Secondary | ICD-10-CM | POA: Insufficient documentation

## 2016-04-03 LAB — CBC WITH DIFFERENTIAL/PLATELET
Basophils Absolute: 0.1 10*3/uL (ref 0.0–0.1)
Basophils Relative: 0 %
EOS ABS: 0.1 10*3/uL (ref 0.0–0.7)
Eosinophils Relative: 1 %
HEMATOCRIT: 34.6 % — AB (ref 39.0–52.0)
HEMOGLOBIN: 12.8 g/dL — AB (ref 13.0–17.0)
LYMPHS ABS: 5 10*3/uL — AB (ref 0.7–4.0)
Lymphocytes Relative: 37 %
MCH: 28.2 pg (ref 26.0–34.0)
MCHC: 37 g/dL — AB (ref 30.0–36.0)
MCV: 76.2 fL — ABNORMAL LOW (ref 78.0–100.0)
MONOS PCT: 12 %
Monocytes Absolute: 1.6 10*3/uL — ABNORMAL HIGH (ref 0.1–1.0)
NEUTROS PCT: 50 %
Neutro Abs: 6.8 10*3/uL (ref 1.7–7.7)
Platelets: 216 10*3/uL (ref 150–400)
RBC: 4.54 MIL/uL (ref 4.22–5.81)
RDW: 15.7 % — ABNORMAL HIGH (ref 11.5–15.5)
WBC: 13.5 10*3/uL — ABNORMAL HIGH (ref 4.0–10.5)

## 2016-04-03 LAB — COMPREHENSIVE METABOLIC PANEL
ALBUMIN: 4.3 g/dL (ref 3.5–5.0)
ALK PHOS: 76 U/L (ref 38–126)
ALT: 19 U/L (ref 17–63)
AST: 42 U/L — AB (ref 15–41)
Anion gap: 6 (ref 5–15)
BILIRUBIN TOTAL: 1.2 mg/dL (ref 0.3–1.2)
BUN: 7 mg/dL (ref 6–20)
CALCIUM: 9.4 mg/dL (ref 8.9–10.3)
CO2: 26 mmol/L (ref 22–32)
CREATININE: 0.9 mg/dL (ref 0.61–1.24)
Chloride: 106 mmol/L (ref 101–111)
GFR calc Af Amer: >60 mL/min (ref >60)
GFR calc non Af Amer: >60 mL/min (ref >60)
GLUCOSE: 108 mg/dL — AB (ref 65–99)
Potassium: 4 mmol/L (ref 3.5–5.1)
Sodium: 138 mmol/L (ref 135–145)
TOTAL PROTEIN: 7.6 g/dL (ref 6.5–8.1)

## 2016-04-03 MED ORDER — SODIUM CHLORIDE 0.45 % IV SOLN
INTRAVENOUS | Status: DC
  Administered 2016-04-03: 21:00:00 via INTRAVENOUS

## 2016-04-03 MED ORDER — HYDROMORPHONE HCL 1 MG/ML IJ SOLN
1.0000 mg | Freq: Once | INTRAMUSCULAR | Status: AC
  Administered 2016-04-03: 1 mg via INTRAVENOUS
  Filled 2016-04-03: qty 1

## 2016-04-03 MED ORDER — KETOROLAC TROMETHAMINE 30 MG/ML IJ SOLN
30.0000 mg | INTRAMUSCULAR | Status: AC
  Administered 2016-04-03: 30 mg via INTRAVENOUS
  Filled 2016-04-03: qty 1

## 2016-04-03 NOTE — ED Triage Notes (Signed)
Pt in c/o sickle pain onset 4 days ago. Was seen here yesterday and felt better so went home and was going to follow up with sickle cell clinic. Pt returns today because pain is worse than it was yesterday before he came in. Pt is alert, interactive, ambulatory in NAD.

## 2016-04-03 NOTE — ED Provider Notes (Signed)
[facility] Provider Note   CSN: [id number] Arrival date & time: 04/03/16  1959   By signing my name below, I, Teofilo PodMatthew P. Jamison, attest that this documentation has been prepared under the direction and in the presence of Benjiman CoreNathan Daaiyah Baumert, MD . Electronically Signed: Teofilo PodMatthew P. Jamison, ED Scribe. 04/03/2016. 8:36 PM.   History   Chief Complaint Chief Complaint  Patient presents with  . Sickle Cell Pain Crisis    The history is provided by the patient. No language interpreter was used.   HPI Comments:  William Jennings is a 25 y.o. male who presents to the Emergency Department complaining of worsening sickle cell pain x 4 days. Pt states that the pain is present in his right arm, right leg and back, and that his chest began to hurt today. Pt states that these episodes occur once every 3 months typically. Pt has taken ibuprofen 800mg , and 2 oxycodone every 6 hours with no relief.   Past Medical History:  Diagnosis Date  . Sickle cell disease [facility](HCC)     Patient Active Problem List   Diagnosis Date Noted  . Acute chest syndrome due to sickle cell crisis (HCC)   . Sickle cell anemia with crisis (HCC) 02/21/2014  . Sickle cell anemia (HCC) 08/10/2013  . Chest pain 08/10/2013  . Dehydration 08/08/2013  . Sickle cell crisis (HCC) 08/06/2013  . 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 CHOLECYSTECTOMY         Home Medications    Prior to Admission medications   Medication Sig Start Date End Date Taking? Authorizing Provider  folic acid (FOLVITE) 800 MCG tablet Take 1 tablet (800 mcg total) by mouth daily. 02/21/14   Massie MaroonLachina M Hollis, FNP  ibuprofen (ADVIL,MOTRIN) 800 MG tablet Take 1 tablet (800 mg total) by mouth every 8 (eight) hours as needed for mild pain or moderate pain. 10/17/15   Quentin Angstlugbemiga E Jegede, MD    Family History History reviewed. No  pertinent family history.  Social History Social History  Substance Use Topics  . Smoking status: Former Games developermoker  . Smokeless tobacco: Never Used  . Alcohol use Yes     Allergies   Shellfish allergy   Review of Systems Review of Systems  Cardiovascular: Positive for chest pain.  Musculoskeletal: Positive for back pain and myalgias.  All other systems reviewed and are negative.    Physical Exam Updated Vital Signs BP 111/59 (BP Location: Right Arm)   Pulse 63   Temp 98.7 F (37.1 C) (Oral)   Resp 16   Ht 5\' 8"  (1.727 m)   Wt 140 lb (63.5 kg)   SpO2 98%   BMI 21.29 kg/m   Physical Exam  Constitutional: He appears well-developed and well-nourished.  HENT:  Head: Normocephalic and atraumatic.  Eyes: Conjunctivae are normal.  Neck: Neck supple.  Cardiovascular: Normal rate and regular rhythm.   No murmur heard. Pulmonary/Chest: Effort normal and breath sounds normal. No respiratory distress.  Abdominal: Soft. There is no tenderness.  Musculoskeletal: He exhibits no edema or tenderness.  Neurological: He is alert.  Skin: Skin is warm and dry.  Psychiatric: He has a normal mood and affect.  Nursing note and vitals reviewed.    ED Treatments / Results  DIAGNOSTIC STUDIES:  Oxygen Saturation is 96% on RA, normal by my interpretation.    COORDINATION OF CARE:  8:32  PM Discussed treatment plan with pt at bedside and pt agreed to plan.   Labs (all labs ordered are listed, but only abnormal results are displayed) Labs Reviewed  CBC WITH DIFFERENTIAL/PLATELET - Abnormal; Notable for the following:       Result Value   WBC 13.5 (*)    Hemoglobin 12.8 (*)    HCT 34.6 (*)    MCV 76.2 (*)    MCHC 37.0 (*)    RDW 15.7 (*)    Lymphs Abs 5.0 (*)    Monocytes Absolute 1.6 (*)    All other components within normal limits  COMPREHENSIVE METABOLIC PANEL - Abnormal; Notable for the following:    Glucose, Bld 108 (*)    AST 42 (*)    All other components within  normal limits    EKG  EKG Interpretation None       Radiology Dg Chest 2 View  Result Date: 04/03/2016 CLINICAL DATA:  Four days of sickle cell pain. Similar symptoms every 3 months. EXAM: CHEST  2 VIEW COMPARISON:  Chest radiograph Oct 17, 2015 FINDINGS: Cardiomediastinal silhouette is normal. No pleural effusions or focal consolidations. Trachea projects midline and there is no pneumothorax. Sclerosis bilateral humeral heads most compatible with bone infarct. Fish type vertebral bodies. Surgical clips in the included right abdomen compatible with cholecystectomy. IMPRESSION: No acute cardiopulmonary process. Osseous findings of sickle cell disease. Electronically Signed   By: Awilda Metro M.D.   On: 04/03/2016 22:01    Procedures Procedures (including critical care time)  Medications Ordered in ED Medications  0.45 % sodium chloride infusion ( Intravenous Stopped 04/03/16 2246)  ketorolac (TORADOL) 30 MG/ML injection 30 mg (30 mg Intravenous Given 04/03/16 2058)  HYDROmorphone (DILAUDID) injection 1 mg (1 mg Intravenous Given 04/03/16 2058)  HYDROmorphone (DILAUDID) injection 1 mg (1 mg Intravenous Given 04/03/16 2231)  HYDROmorphone (DILAUDID) injection 1 mg (1 mg Intravenous Given 04/03/16 2313)     Initial Impression / Assessment and Plan / ED Course  I have reviewed the triage vital signs and the nursing notes.  Pertinent labs & imaging results that were available during my care of the patient were reviewed by me and considered in my medical decision making (see chart for details).  Clinical Course    Patient with sickle cell. Feels better after IV treatment. Labs reassuring. Seen yesterday for same. Will discharge home.  Final Clinical Impressions(s) / ED Diagnoses   Final diagnoses:  Sickle cell pain crisis [facility])    New Prescriptions New Prescriptions   No medications on file  I personally performed the services described in this documentation, which was  scribed in my presence. The recorded information has been reviewed and is accurate.       Benjiman Core, MD 04/03/16 [id number]

## 2016-04-05 ENCOUNTER — Telehealth (HOSPITAL_COMMUNITY): Payer: Self-pay | Admitting: *Deleted

## 2016-04-05 ENCOUNTER — Encounter (HOSPITAL_COMMUNITY): Payer: Self-pay | Admitting: Hematology

## 2016-04-05 ENCOUNTER — Non-Acute Institutional Stay (HOSPITAL_COMMUNITY)
Admission: AD | Admit: 2016-04-05 | Discharge: 2016-04-05 | Disposition: A | Discharge status: Home / Self Care | Payer: Self-pay | Source: Ambulatory Visit | Attending: Internal Medicine | Admitting: Internal Medicine

## 2016-04-05 DIAGNOSIS — D5700 Hb-SS disease with crisis, unspecified: Secondary | ICD-10-CM | POA: Diagnosis present

## 2016-04-05 DIAGNOSIS — M549 Dorsalgia, unspecified: Secondary | ICD-10-CM | POA: Insufficient documentation

## 2016-04-05 DIAGNOSIS — M79604 Pain in right leg: Secondary | ICD-10-CM | POA: Insufficient documentation

## 2016-04-05 DIAGNOSIS — M79601 Pain in right arm: Secondary | ICD-10-CM | POA: Insufficient documentation

## 2016-04-05 DIAGNOSIS — Z79899 Other long term (current) drug therapy: Secondary | ICD-10-CM | POA: Insufficient documentation

## 2016-04-05 DIAGNOSIS — Z91013 Allergy to seafood: Secondary | ICD-10-CM | POA: Insufficient documentation

## 2016-04-05 DIAGNOSIS — Z9049 Acquired absence of other specified parts of digestive tract: Secondary | ICD-10-CM | POA: Insufficient documentation

## 2016-04-05 MED ORDER — ONDANSETRON HCL 4 MG/2ML IJ SOLN: 4.0000 mg | Freq: Four times a day (QID) | INTRAMUSCULAR | Status: DC | PRN

## 2016-04-05 MED ORDER — KETOROLAC TROMETHAMINE 30 MG/ML IJ SOLN
30.0000 mg | Freq: Four times a day (QID) | INTRAMUSCULAR | Status: DC
  Administered 2016-04-05: 30 mg via INTRAVENOUS
  Filled 2016-04-05: qty 1

## 2016-04-05 MED ORDER — HYDROMORPHONE HCL 2 MG/ML IJ SOLN
1.0000 mg | INTRAMUSCULAR | Status: DC | PRN
  Filled 2016-04-05: qty 1

## 2016-04-05 MED ORDER — DEXTROSE-NACL 5-0.45 % IV SOLN
INTRAVENOUS | Status: DC
  Administered 2016-04-05: 12:00:00 via INTRAVENOUS

## 2016-04-05 MED ORDER — POLYETHYLENE GLYCOL 3350 17 G PO PACK: 17.0000 g | PACK | Freq: Every day | ORAL | Status: DC | PRN

## 2016-04-05 MED ORDER — HYDROMORPHONE 1 MG/ML IV SOLN
INTRAVENOUS | Status: DC
  Administered 2016-04-05: 7.8 mg via INTRAVENOUS
  Administered 2016-04-05: 12:00:00 via INTRAVENOUS
  Filled 2016-04-05: qty 25

## 2016-04-05 MED ORDER — NALOXONE HCL 0.4 MG/ML IJ SOLN: 0.4000 mg | INTRAMUSCULAR | Status: DC | PRN

## 2016-04-05 MED ORDER — SODIUM CHLORIDE 0.9% FLUSH
9.0000 mL | INTRAVENOUS | Status: DC | PRN
Start: 2016-04-05 — End: 2016-04-05

## 2016-04-05 MED ORDER — SODIUM CHLORIDE 0.9 % IV SOLN: 25.0000 mg | INTRAVENOUS | Status: DC | PRN

## 2016-04-05 MED ORDER — DIPHENHYDRAMINE HCL 25 MG PO CAPS: 25.0000 mg | ORAL_CAPSULE | ORAL | Status: DC | PRN

## 2016-04-05 MED ORDER — SENNOSIDES-DOCUSATE SODIUM 8.6-50 MG PO TABS: 1.0000 | ORAL_TABLET | Freq: Two times a day (BID) | ORAL | Status: DC

## 2016-04-05 NOTE — H&P (Signed)
[facility] History and Physical  William JarvisGeovanni D Dock [id number] DOB: Sep 20, 1990 DOA: 04/05/2016  PCP: No PCP Per Patient   Chief Complaint:  Chief Complaint  Patient presents with  . Sickle Cell Pain Crisis    HPI: William Jennings is a 25 y.o. male with history of sickle cell disease who presents to the day hospital today with C/O pain to his right arm, right leg and back, going on for about 5 days. He went to the ED on 10/20 and 10/21, received treatment per ED sickle cell pain protocol, but he felt like it was not enough to ease his pain. He states his pain is 8/10 and he had his last pain meds at 7:30 this morning, Oxycodone and Ibuprofen, with no sustained relief. He denies fever, chest pain, abd pain, diarrhea, nausea, vomiting or priapism. 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 He manages his sickle cell very well and only occasionally takes percocet or oxycodone when prescribed and as needed.  Systemic Review: General: The patient denies anorexia, fever, weight loss Cardiac: Denies chest pain, syncope, palpitations, pedal edema  Respiratory: Denies cough, shortness of breath, wheezing GI: Denies severe indigestion/heartburn, abdominal pain, nausea, vomiting, diarrhea and constipation GU: Denies hematuria, incontinence, dysuria  Musculoskeletal: Denies arthritis  Skin: Denies suspicious skin lesions Neurologic: Denies focal weakness or numbness, change in vision  Past Medical History:  Diagnosis Date  . Sickle cell disease (HCC)     Past Surgical History:  Procedure Laterality Date  . CHOLECYSTECTOMY      Allergies  Allergen Reactions  . Shellfish Allergy Anaphylaxis    No family history on file.    Prior to Admission medications   Medication Sig Start Date End Date Taking? Authorizing Provider  folic acid (FOLVITE) 800 MCG tablet Take 1 tablet (800 mcg total) by mouth daily. 02/21/14  Yes Massie MaroonLachina M Hollis, FNP  ibuprofen  (ADVIL,MOTRIN) 800 MG tablet Take 1 tablet (800 mg total) by mouth every 8 (eight) hours as needed for mild pain or moderate pain. 10/17/15  Yes Quentin Angstlugbemiga E Lexton Hidalgo, MD     Physical Exam: Vitals:   04/05/16 1109  BP: 123/78  Pulse: 76  Resp: 20  Temp: 98.2 F (36.8 C)  TempSrc: Oral  SpO2: 100%  Weight: 140 lb (63.5 kg)  Height: 5\' 8"  (1.727 m)    General: Alert, awake, afebrile, anicteric, not in obvious distress HEENT: Normocephalic and Atraumatic, Mucous membranes pink                PERRLA; EOM intact; No scleral icterus,                 Nares: Patent, Oropharynx: Clear, Fair Dentition                 Neck: FROM, no cervical lymphadenopathy, thyromegaly, carotid bruit or JVD;  CHEST WALL: No tenderness  CHEST: Normal respiration, clear to auscultation bilaterally  HEART: Regular rate and rhythm; no murmurs rubs or gallops  BACK: No kyphosis or scoliosis; no CVA tenderness  ABDOMEN: Positive Bowel Sounds, soft, non-tender; no masses, no organomegaly EXTREMITIES: No cyanosis, clubbing, or edema SKIN:  no rash or ulceration  CNS: Alert and Oriented x 4, Nonfocal exam, CN 2-12 intact  Labs on Admission:  Basic Metabolic Panel:  Recent Labs Lab 04/02/16 1604 04/03/16 2055  NA 138 138  K 4.1 4.0  CL 109 106  CO2 24 26  GLUCOSE 104* 108*  BUN  6 7  CREATININE 0.80 0.90  CALCIUM 8.9 9.4   Liver Function Tests:  Recent Labs Lab 04/02/16 1604 04/03/16 2055  AST 27 42*  ALT 17 19  ALKPHOS 73 76  BILITOT 1.5* 1.2  PROT 7.2 7.6  ALBUMIN 4.0 4.3   No results for input(s): LIPASE, AMYLASE in the last 168 hours. No results for input(s): AMMONIA in the last 168 hours. CBC:  Recent Labs Lab 04/02/16 1604 04/03/16 2055  WBC 12.4* 13.5*  NEUTROABS 7.5 6.8  HGB 11.8* 12.8*  HCT 32.4* 34.6*  MCV 76.6* 76.2*  PLT 227 216   Cardiac Enzymes: No results for input(s): CKTOTAL, CKMB, CKMBINDEX, TROPONINI in the last 168 hours.  BNP (last 3 results) No results for  input(s): BNP in the last 8760 hours.  ProBNP (last 3 results) No results for input(s): PROBNP in the last 8760 hours.  CBG: No results for input(s): GLUCAP in the last 168 hours.   Assessment/Plan Active Problems:   Sickle cell anemia with pain (HCC)   Admits to the Day Hospital  IVF D5 .45% Saline @ 125 mls/hour  Weight based Dilaudid PCA started within 30 minutes of admission + clinician assisted doses of dilaudid IV prn  IV Toradol 30 mg Q 6 H  Monitor vitals very closely, Re-evaluate pain scale every hour  2 L of Oxygen by [place]  Patient will be re-evaluated for pain in the context of function and relationship to baseline as care progresses.  If no significant relieve from pain (remains above 5/10) will transfer patient to inpatient services for further evaluation and management  Code Status: Full  Family Communication: None  DVT Prophylaxis: Ambulate as tolerated   Time spent: 35 Minutes  Aaliayah Miao, MD, MHA, FACP, FAAP, CPE  If 7PM-7AM, please contact night-coverage [email or web address] 04/05/2016, 11:29 AM

## 2016-04-05 NOTE — Telephone Encounter (Signed)
Patient called to see if he can come for treatment. Pt states he is having pain in his right arm, right leg and back. Stated that the pain has been going on for about 5 days. He went to the ED and received treatment, but felt like it was not enough to ease his pain. He states his pain is 8/10 and he had his last pain med at 7:30 this morning of Oxycodone and Ibuprofen. He denies fever, chest pain, abd pain, diarrhea, nausea, vomiting or priapism. Will check with provider and give him a call back. Pt voiced understanding.

## 2016-04-05 NOTE — Progress Notes (Signed)
[facility]. Pt states his pain was eight and in his right arm, right leg and back. Pt was treated with a heating pad, IV fluids, Dilaudid PCA and Toradol.. Pt tolerated treatment well. He was down to 4/10 on the pain scale at discharge. Discharge instructions given to patient with verbal understanding. Pt was alert, oriented and ambulatory at discharge.

## 2016-04-05 NOTE — Discharge Instructions (Signed)
Sickle Cell Anemia, Adult Sickle cell anemia is a condition in which red blood cells have an abnormal "sickle" shape. This abnormal shape shortens the cells' life span, which results in a lower than normal concentration of red blood cells in the blood. The sickle shape also causes the cells to clump together and block free blood flow through the blood vessels. As a result, the tissues and organs of the body do not receive enough oxygen. Sickle cell anemia causes organ damage and pain and increases the risk of infection. CAUSES  Sickle cell anemia is a genetic disorder. Those who receive two copies of the gene have the condition, and those who receive one copy have the trait. RISK FACTORS The sickle cell gene is most common in people whose families originated in [place]. Other areas of the globe where sickle cell trait occurs include the Mediterranean, [place] and [place], the [place], and the [place].  SIGNS AND SYMPTOMS  Pain, especially in the extremities, back, chest, or abdomen (common). The pain may start suddenly or may develop following an illness, especially if there is dehydration. Pain can also occur due to overexertion or exposure to extreme temperature changes.  Frequent severe bacterial infections, especially certain types of pneumonia and meningitis.  Pain and swelling in the hands and feet.  Decreased activity.   Loss of appetite.   Change in behavior.  Headaches.  Seizures.  Shortness of breath or difficulty breathing.  Vision changes.  Skin ulcers. Those with the trait may not have symptoms or they may have mild symptoms.  DIAGNOSIS  Sickle cell anemia is diagnosed with blood tests that demonstrate the genetic trait. It is often diagnosed during the newborn period, due to mandatory testing nationwide. A variety of blood tests, X-rays, CT scans, MRI scans, ultrasounds, and lung function tests may also be done to monitor the condition. TREATMENT  Sickle  cell anemia may be treated with:  Medicines. You may be given pain medicines, antibiotic medicines (to treat and prevent infections) or medicines to increase the production of certain types of hemoglobin.  Fluids.  Oxygen.  Blood transfusions. HOME CARE INSTRUCTIONS   Drink enough fluid to keep your urine clear or pale yellow. Increase your fluid intake in hot weather and during exercise.  Do not smoke. Smoking lowers oxygen levels in the blood.   Only take over-the-counter or prescription medicines for pain, fever, or discomfort as directed by your health care provider.  Take antibiotics as directed by your health care provider. Make sure you finish them it even if you start to feel better.   Take supplements as directed by your health care provider.   Consider wearing a medical alert bracelet. This tells anyone caring for you in an emergency of your condition.   When traveling, keep your medical information, health care provider's names, and the medicines you take with you at all times.   If you develop a fever, do not take medicines to reduce the fever right away. This could cover up a problem that is developing. Notify your health care provider.  Keep all follow-up appointments with your health care provider. Sickle cell anemia requires regular medical care. SEEK MEDICAL CARE IF: You have a fever. SEEK IMMEDIATE MEDICAL CARE IF:   You feel dizzy or faint.   You have new abdominal pain, especially on the left side near the stomach area.   You develop a persistent, often uncomfortable and painful penile erection (priapism). If this is not treated immediately it   will lead to impotence.   You have numbness your arms or legs or you have a hard time moving them.   You have a hard time with speech.   You have a fever or persistent symptoms for more than 2-3 days.   You have a fever and your symptoms suddenly get worse.   You have signs or symptoms of infection.  These include:   Chills.   Abnormal tiredness (lethargy).   Irritability.   Poor eating.   Vomiting.   You develop pain that is not helped with medicine.   You develop shortness of breath.  You have pain in your chest.   You are coughing up pus-like or bloody sputum.   You develop a stiff neck.  Your feet or hands swell or have pain.  Your abdomen appears bloated.  You develop joint pain. MAKE SURE YOU:  Understand these instructions.   This information is not intended to replace advice given to you by your health care provider. Make sure you discuss any questions you have with your health care provider.   Document Released: 09/08/2005 Document Revised: 06/21/2014 Document Reviewed: 01/10/2013 Elsevier Interactive Patient Education 2016 Elsevier Inc.  

## 2016-04-05 NOTE — Discharge Summary (Signed)
Physician Discharge Summary  William JarvisGeovanni D Jennings [id number] DOB: Sep 28, 1990 DOA: 04/05/2016  PCP: No PCP Per Patient  Admit date: 04/05/2016  Discharge date: 04/05/2016  Time spent: 30 minutes  Discharge Diagnoses:  Active Problems:   Sickle cell anemia with pain [facility](HCC)   Discharge Condition: Stable  Diet recommendation: Regular  Filed Weights   04/05/16 1109  Weight: 140 lb (63.5 kg)    History of present illness:  William Jennings is a 25 y.o. male with history of sickle cell disease who presents to the day hospital today with C/O pain to his right arm, right leg and back, going on for about 5 days. He went to the ED on 10/20 and 10/21, received treatment per ED sickle cell pain protocol, but he felt like it was not enough to ease his pain. He states his pain is 8/10 and he had his last pain meds at 7:30 this morning, Oxycodone and Ibuprofen, with no sustained relief. He denies fever, chest pain, abd pain, diarrhea, nausea, vomiting or priapism. 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 He manages his sickle cell very well and only occasionally takes percocet or oxycodone when prescribed and as needed.Patient has been going through some stress lately, including quitting his job last week and now actively applying.  Hospital Course:  William JarvisGeovanni D Jennings was admitted to the day hospital with sickle cell painful crisis. Patient was treated with weight based IV Dilaudid PCA, IV Toradol as well as IV fluids. William Jennings showed significant improvement symptomatically, pain improved from 8 to 4/10 at the time of discharge. Patient was discharged home is a hemodynamically stable condition. William Jennings will follow-up at the clinic as previously scheduled, continue with home medications as per prior to admission.  Discharge Instructions We discussed the need for good hydration, monitoring of hydration status, avoidance of heat, cold, stress, and infection triggers. We discussed the need  to be compliant with taking Hydrea. William Jennings was reminded of the need to seek medical attention of any symptoms of bleeding, anemia, or infection occurs.  Discharge Exam: Vitals:   04/05/16 1430 04/05/16 1535  BP: 96/60 94/60  Pulse: 72 81  Resp: 11 11  Temp:      General appearance: alert, cooperative and no distress Eyes: conjunctivae/corneas clear. PERRL, EOM's intact. Fundi benign. Neck: no adenopathy, no carotid bruit, no JVD, supple, symmetrical, trachea midline and thyroid not enlarged, symmetric, no tenderness/mass/nodules Back: symmetric, no curvature. ROM normal. No CVA tenderness. Resp: clear to auscultation bilaterally Chest wall: no tenderness Cardio: regular rate and rhythm, S1, S2 normal, no murmur, click, rub or gallop GI: soft, non-tender; bowel sounds normal; no masses, no organomegaly Extremities: extremities normal, atraumatic, no cyanosis or edema Pulses: 2+ and symmetric Skin: Skin color, texture, turgor normal. No rashes or lesions Neurologic: Grossly normal   Current Discharge Medication List    CONTINUE these medications which have NOT CHANGED   Details  folic acid (FOLVITE) 800 MCG tablet Take 1 tablet (800 mcg total) by mouth daily. Qty: 30 tablet, Refills: 6    ibuprofen (ADVIL,MOTRIN) 800 MG tablet Take 1 tablet (800 mg total) by mouth every 8 (eight) hours as needed for mild pain or moderate pain. Qty: 60 tablet, Refills: 3       Allergies  Allergen Reactions  . Shellfish Allergy Anaphylaxis    Significant Diagnostic Studies: Dg Chest 2 View  Result Date: 04/03/2016 CLINICAL DATA:  Four days of sickle cell pain. Similar symptoms every 3 months.  EXAM: CHEST  2 VIEW COMPARISON:  Chest radiograph Oct 17, 2015 FINDINGS: Cardiomediastinal silhouette is normal. No pleural effusions or focal consolidations. Trachea projects midline and there is no pneumothorax. Sclerosis bilateral humeral heads most compatible with bone infarct. Fish type vertebral  bodies. Surgical clips in the included right abdomen compatible with cholecystectomy. IMPRESSION: No acute cardiopulmonary process. Osseous findings of sickle cell disease. Electronically Signed   By: Awilda Metro M.D.   On: 04/03/2016 22:01    Signed:  Jeanann Lewandowsky MD, MHA, FACP, FAAP, CPE   04/05/2016, 4:37 PM

## 2016-04-06 ENCOUNTER — Telehealth (HOSPITAL_COMMUNITY): Payer: Self-pay | Admitting: *Deleted

## 2016-04-06 NOTE — Telephone Encounter (Signed)
Pt called after speaking with the provider to come in for treatment. Pt voiced understanding.

## 2016-05-17 ENCOUNTER — Encounter (HOSPITAL_BASED_OUTPATIENT_CLINIC_OR_DEPARTMENT_OTHER): Payer: Self-pay | Admitting: *Deleted

## 2016-05-17 ENCOUNTER — Emergency Department (HOSPITAL_BASED_OUTPATIENT_CLINIC_OR_DEPARTMENT_OTHER)
Admission: EM | Admit: 2016-05-17 | Discharge: 2016-05-17 | Disposition: A | Discharge status: Home / Self Care | Payer: Self-pay | Attending: Emergency Medicine | Admitting: Emergency Medicine

## 2016-05-17 DIAGNOSIS — Z87891 Personal history of nicotine dependence: Secondary | ICD-10-CM | POA: Insufficient documentation

## 2016-05-17 DIAGNOSIS — D5700 Hb-SS disease with crisis, unspecified: Secondary | ICD-10-CM | POA: Insufficient documentation

## 2016-05-17 LAB — CBC WITH DIFFERENTIAL/PLATELET
BAND NEUTROPHILS: 0 %
BASOS ABS: 0 10*3/uL (ref 0.0–0.1)
BLASTS: 0 %
Basophils Relative: 0 %
Eosinophils Absolute: 0.1 10*3/uL (ref 0.0–0.7)
Eosinophils Relative: 1 %
HEMATOCRIT: 32.7 % — AB (ref 39.0–52.0)
HEMOGLOBIN: 12.1 g/dL — AB (ref 13.0–17.0)
LYMPHS PCT: 42 %
Lymphs Abs: 3.9 10*3/uL (ref 0.7–4.0)
MCH: 28.2 pg (ref 26.0–34.0)
MCHC: 37 g/dL — AB (ref 30.0–36.0)
MCV: 76.2 fL — AB (ref 78.0–100.0)
Metamyelocytes Relative: 1 %
Monocytes Absolute: 1.2 10*3/uL — ABNORMAL HIGH (ref 0.1–1.0)
Monocytes Relative: 13 %
Myelocytes: 0 %
NEUTROS ABS: 4.2 10*3/uL (ref 1.7–7.7)
NEUTROS PCT: 43 %
PROMYELOCYTES ABS: 0 %
Platelets: 132 10*3/uL — ABNORMAL LOW (ref 150–400)
RBC: 4.29 MIL/uL (ref 4.22–5.81)
RDW: 14.9 % (ref 11.5–15.5)
WBC: 9.4 10*3/uL (ref 4.0–10.5)
nRBC: 0 /100 WBC

## 2016-05-17 LAB — BASIC METABOLIC PANEL
ANION GAP: 3 — AB (ref 5–15)
BUN: 6 mg/dL (ref 6–20)
CALCIUM: 8.7 mg/dL — AB (ref 8.9–10.3)
CO2: 27 mmol/L (ref 22–32)
Chloride: 107 mmol/L (ref 101–111)
Creatinine, Ser: 0.85 mg/dL (ref 0.61–1.24)
GLUCOSE: 85 mg/dL (ref 65–99)
POTASSIUM: 3.6 mmol/L (ref 3.5–5.1)
SODIUM: 137 mmol/L (ref 135–145)

## 2016-05-17 LAB — RETICULOCYTES
RBC.: 4.38 MIL/uL (ref 4.22–5.81)
RETIC COUNT ABSOLUTE: 219 10*3/uL — AB (ref 19.0–186.0)
Retic Ct Pct: 5 % — ABNORMAL HIGH (ref 0.4–3.1)

## 2016-05-17 MED ORDER — OXYCODONE-ACETAMINOPHEN 5-325 MG PO TABS
1.0000 | ORAL_TABLET | Freq: Once | ORAL | Status: AC
  Administered 2016-05-17: 1 via ORAL
  Filled 2016-05-17: qty 1

## 2016-05-17 MED ORDER — HYDROMORPHONE HCL 1 MG/ML IJ SOLN
2.0000 mg | INTRAMUSCULAR | Status: DC | PRN
  Administered 2016-05-17 (×3): 2 mg via INTRAVENOUS
  Filled 2016-05-17 (×3): qty 2

## 2016-05-17 NOTE — ED Provider Notes (Signed)
[facility]   CSN: [id number] Arrival date & time: 05/17/16  1424 By signing my name below, I, William Jennings, attest that this documentation has been prepared under the direction and in the presence of Fayrene HelperBowie Aydien Majette, PA-C. Electronically Signed: Linus GalasMaharshi Jennings, ED Scribe. 05/17/16. 4:16 PM.  History   Chief Complaint Chief Complaint  Patient presents with  . Sickle Cell Pain Crisis   The history is provided by the patient. No language interpreter was used.   HPI Comments: William Jennings is a 25 y.o. male who presents to the Emergency Department with a PMHx of Sickle Cell Disease complaining of throbbing bilateral leg pain and right arm pain that began 3 days ago. Pt states his pain is exacerbated with ambulation. Pt takes hydrrocodone and 800mg  ibuprofen but it has not provided him with any relief. He states his symptoms are consistent with his typical sickle cell flare which may have been triggered by the weather and lack of sleep. Pt denies any fevers, chills, CP, SOB, N/V/D or any other symptoms at this time.   Past Medical History:  Diagnosis Date  . Sickle cell disease [facility](HCC)     Patient Active Problem List   Diagnosis Date Noted  . Sickle cell anemia with pain (HCC) 04/05/2016  . Acute chest syndrome due to sickle cell crisis (HCC)   . Sickle cell anemia with crisis (HCC) 02/21/2014  . Sickle cell anemia (HCC) 08/10/2013  . Chest pain 08/10/2013  . Dehydration 08/08/2013  . Sickle cell crisis (HCC) 08/06/2013  . 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 CHOLECYSTECTOMY     Home Medications    Prior to Admission medications   Medication Sig Start Date End Date Taking? Authorizing Provider  folic acid (FOLVITE) 800 MCG tablet Take 1 tablet (800 mcg total) by mouth daily. 02/21/14   Massie MaroonLachina M Hollis, FNP  ibuprofen (ADVIL,MOTRIN) 800 MG  tablet Take 1 tablet (800 mg total) by mouth every 8 (eight) hours as needed for mild pain or moderate pain. 10/17/15   Quentin Angstlugbemiga E Jegede, MD    Family History No family history on file.  Social History Social History  Substance Use Topics  . Smoking status: Former Games developermoker  . Smokeless tobacco: Never Used  . Alcohol use Yes     Allergies   Shellfish allergy  Review of Systems Review of Systems  Constitutional: Negative for chills and fever.  Respiratory: Negative for shortness of breath.   Cardiovascular: Negative for chest pain.  Gastrointestinal: Negative for diarrhea, nausea and vomiting.  Musculoskeletal: Positive for myalgias.  All other systems reviewed and are negative.  Physical Exam Updated Vital Signs BP 116/74   Pulse (!) 54   Temp 98.4 F (36.9 C) (Oral)   Resp 20   Ht 5\' 8"  (1.727 m)   Wt 145 lb (65.8 kg)   SpO2 100%   BMI 22.05 kg/m   Physical Exam  Constitutional: He is oriented to person, place, and time. He appears well-developed and well-nourished.  HENT:  Head: Normocephalic.  Eyes: Conjunctivae are normal.  Cardiovascular: Normal rate, regular rhythm and normal heart sounds.   Pulmonary/Chest: Effort normal and breath sounds normal. No respiratory distress.  Neurological: He is alert and oriented to person, place, and time.  Skin: Skin is warm and dry.  Psychiatric: He has a normal mood and affect.  Nursing note and  vitals reviewed.  ED Treatments / Results  DIAGNOSTIC STUDIES: Oxygen Saturation is 100% on room air, normal by my interpretation.    COORDINATION OF CARE: 4:16 PM Discussed treatment plan with pt including management of pain at bedside and pt agreed to plan.  Labs (all labs ordered are listed, but only abnormal results are displayed) Labs Reviewed  BASIC METABOLIC PANEL  CBC WITH DIFFERENTIAL/PLATELET  RETICULOCYTES    EKG  EKG Interpretation None       Radiology No results found.  Procedures Procedures  (including critical care time)  Medications Ordered in ED Medications - No data to display   Initial Impression / Assessment and Plan / ED Course  I have reviewed the triage vital signs and the nursing notes.  Pertinent labs & imaging results that were available during my care of the patient were reviewed by me and considered in my medical decision making (see chart for details).  Clinical Course     BP 114/67   Pulse 85   Temp 98.4 F (36.9 C) (Oral)   Resp 17   Ht 5\' 8"  (1.727 m)   Wt 65.8 kg   SpO2 99%   BMI 22.05 kg/m    Final Clinical Impressions(s) / ED Diagnoses   Final diagnoses:  Sickle cell pain crisis (HCC)    New Prescriptions New Prescriptions   No medications on file   I personally performed the services described in this documentation, which was scribed in my presence. The recorded information has been reviewed and is accurate.   7:33 PM Pt here with sickle cell related pain.  No sxs concerning for acute chest.  Pt received 3 doses of dilaudid as well as 1 percocet and felt better.  Stable for discharge.  outpt f/u recommended.  Return precaution discussed.     Fayrene HelperBowie Taimane Stimmel, PA-C 05/17/16 1934    Canary Brimhristopher J Tegeler, MD 05/18/16 [phone]

## 2016-05-17 NOTE — ED Notes (Signed)
ED Provider at bedside. 

## 2016-05-17 NOTE — ED Triage Notes (Signed)
Body pain x 3 days. States he is having a sickle cell crisis. He is taking Vicodin and Ibuprofen. [facility]

## 2016-05-17 NOTE — ED Notes (Signed)
Pt verbalizes understanding of d/c instructions and denies any further need at this time. 

## 2016-06-19 DIAGNOSIS — D5700 Hb-SS disease with crisis, unspecified: Secondary | ICD-10-CM | POA: Insufficient documentation

## 2016-06-19 DIAGNOSIS — Z79899 Other long term (current) drug therapy: Secondary | ICD-10-CM | POA: Insufficient documentation

## 2016-06-19 DIAGNOSIS — Z87891 Personal history of nicotine dependence: Secondary | ICD-10-CM | POA: Diagnosis not present

## 2016-06-20 ENCOUNTER — Encounter (HOSPITAL_BASED_OUTPATIENT_CLINIC_OR_DEPARTMENT_OTHER): Payer: Self-pay | Admitting: Emergency Medicine

## 2016-06-20 ENCOUNTER — Emergency Department (HOSPITAL_BASED_OUTPATIENT_CLINIC_OR_DEPARTMENT_OTHER)
Admission: EM | Admit: 2016-06-20 | Discharge: 2016-06-20 | Disposition: A | Discharge status: Home / Self Care | Payer: Medicaid Other | Attending: Emergency Medicine | Admitting: Emergency Medicine

## 2016-06-20 DIAGNOSIS — D5700 Hb-SS disease with crisis, unspecified: Secondary | ICD-10-CM

## 2016-06-20 LAB — CBC WITH DIFFERENTIAL/PLATELET
BASOS ABS: 0 10*3/uL (ref 0.0–0.1)
Basophils Relative: 0 %
Eosinophils Absolute: 0.4 10*3/uL (ref 0.0–0.7)
Eosinophils Relative: 5 %
HEMATOCRIT: 33.7 % — AB (ref 39.0–52.0)
HEMOGLOBIN: 12.1 g/dL — AB (ref 13.0–17.0)
LYMPHS PCT: 15 %
Lymphs Abs: 1.3 10*3/uL (ref 0.7–4.0)
MCH: 27.4 pg (ref 26.0–34.0)
MCHC: 35.9 g/dL (ref 30.0–36.0)
MCV: 76.2 fL — ABNORMAL LOW (ref 78.0–100.0)
MONO ABS: 1.5 10*3/uL — AB (ref 0.1–1.0)
Monocytes Relative: 17 %
NEUTROS ABS: 5.6 10*3/uL (ref 1.7–7.7)
NEUTROS PCT: 63 %
Platelets: 140 10*3/uL — ABNORMAL LOW (ref 150–400)
RBC: 4.42 MIL/uL (ref 4.22–5.81)
RDW: 15.6 % — ABNORMAL HIGH (ref 11.5–15.5)
WBC: 8.9 10*3/uL (ref 4.0–10.5)

## 2016-06-20 LAB — COMPREHENSIVE METABOLIC PANEL
ALBUMIN: 4.5 g/dL (ref 3.5–5.0)
ALT: 18 U/L (ref 17–63)
ANION GAP: 6 (ref 5–15)
AST: 27 U/L (ref 15–41)
Alkaline Phosphatase: 87 U/L (ref 38–126)
BILIRUBIN TOTAL: 1.7 mg/dL — AB (ref 0.3–1.2)
BUN: 10 mg/dL (ref 6–20)
CALCIUM: 9.2 mg/dL (ref 8.9–10.3)
CO2: 27 mmol/L (ref 22–32)
Chloride: 105 mmol/L (ref 101–111)
Creatinine, Ser: 0.86 mg/dL (ref 0.61–1.24)
GFR calc non Af Amer: >60 mL/min (ref >60)
GLUCOSE: 100 mg/dL — AB (ref 65–99)
POTASSIUM: 3.7 mmol/L (ref 3.5–5.1)
Sodium: 138 mmol/L (ref 135–145)
TOTAL PROTEIN: 7.7 g/dL (ref 6.5–8.1)

## 2016-06-20 LAB — RETICULOCYTES
RBC.: 4.36 MIL/uL (ref 4.22–5.81)
RETIC COUNT ABSOLUTE: 244.2 10*3/uL — AB (ref 19.0–186.0)
Retic Ct Pct: 5.6 % — ABNORMAL HIGH (ref 0.4–3.1)

## 2016-06-20 MED ORDER — KETOROLAC TROMETHAMINE 15 MG/ML IJ SOLN
15.0000 mg | Freq: Once | INTRAMUSCULAR | Status: AC
  Administered 2016-06-20: 15 mg via INTRAVENOUS
  Filled 2016-06-20: qty 1

## 2016-06-20 MED ORDER — HYDROMORPHONE HCL 1 MG/ML IJ SOLN
1.0000 mg | Freq: Once | INTRAMUSCULAR | Status: AC
  Administered 2016-06-20: 1 mg via INTRAVENOUS
  Filled 2016-06-20: qty 1

## 2016-06-20 MED ORDER — SODIUM CHLORIDE 0.9 % IV BOLUS (SEPSIS)
1000.0000 mL | Freq: Once | INTRAVENOUS | Status: AC
  Administered 2016-06-20: 1000 mL via INTRAVENOUS

## 2016-06-20 MED ORDER — DIPHENHYDRAMINE HCL 50 MG/ML IJ SOLN
25.0000 mg | Freq: Once | INTRAMUSCULAR | Status: AC
  Administered 2016-06-20: 25 mg via INTRAVENOUS
  Filled 2016-06-20: qty 1

## 2016-06-20 MED ORDER — ONDANSETRON HCL 4 MG/2ML IJ SOLN
4.0000 mg | Freq: Once | INTRAMUSCULAR | Status: AC
  Administered 2016-06-20: 4 mg via INTRAVENOUS
  Filled 2016-06-20: qty 2

## 2016-06-20 MED ORDER — HYDROMORPHONE HCL 4 MG PO TABS: 4.0000 mg | ORAL_TABLET | ORAL | 0 refills | Status: DC | PRN

## 2016-06-20 NOTE — ED Notes (Signed)
After triage was complete, pt requested a wheelchair and began to limp on his left leg to get into the wheelchair.

## 2016-06-20 NOTE — ED Notes (Signed)
Pt walking to bathroom in NAD.

## 2016-06-20 NOTE — ED Notes (Signed)
Resting, lightly sleeping, spontaneously arousable. Rates pain 5/10, "one more dose and should be able/ready to go".

## 2016-06-20 NOTE — ED Notes (Signed)
Up to b/r, steady gait 

## 2016-06-20 NOTE — ED Triage Notes (Signed)
Pt reports "sickle cell pain" in his left leg since this morning.  Pt reports being out of his prescription pain medications and states he took 1000mg  actaminophen approx 2200 tonight with no relief.  Pt denies any nausea or vomiting.  Pt able to ambulate without difficulty.

## 2016-06-20 NOTE — ED Notes (Signed)
C/o L leg pain, relates to sickle cell crisis, rates 10/10, onset 4d ago, last crisis 3 weeks ago, pt of [facility]Janie [facility] [place], last seen 4 months ago, no relief with daily folic acid, ibuprofen, oxycodone or aleve, (denies: recent illness/sickness, injury, fever, nvd, dizziness, CP, sob, or other sx), "feel dehydrated". States (when asked), toradol, dilaudid and benadryl usually help". States, "Sx/crisis seem to be more frequent since the cold weather". CMS/ROM intact, alert, NAD, calm, relaxed.

## 2016-06-20 NOTE — ED Provider Notes (Addendum)
[facility] Provider Note: William DellJ. Lane Nikitta Sobiech, MD, [name]  CSN: [id number] MRN: [id number] ARRIVAL: 06/19/16 at 2354 ROOM: MH08/MH08   CHIEF COMPLAINT  Sickle Cell Pain Crisis   HISTORY OF PRESENT ILLNESS  William Jennings is a 26 y.o. male with a history of sickle cell disease. He is here with a sickle cell pain crisis which began about 4 days ago. The pain is located in his left leg, primarily the left thigh. [facility] the [facility] of this month for a Doppler of the left lower extremity was negative for DVT. He was treated with analgesics and discharged home with a prescription for Keflex and oxycodone. He is not sure why he was given a prescription for Keflex as he has not had a fever or other signs of infection. He has been self-medicating with oxycodone and ibuprofen at home the past 2 days but cannot get his pain under control. He describes the pain as severe and somewhat worse with movement of the left leg. It is consistent with previous sickle cell pain. He denies chest pain, shortness of breath, nausea, vomiting or diarrhea.  [place] with [place] [facility] [place] substances [place] reveals the patient has received Only one opioid prescription in the past year. A review of recent records shows that he did receive a second prescription 4 days ago which has not yet been reported to the database.   Past Medical History:  Diagnosis Date  . Sickle cell disease (HCC)     Past Surgical History:  Procedure Laterality Date  . CHOLECYSTECTOMY      No family history on file.  Social History  Substance Use Topics  . Smoking status: Former Games developermoker  . Smokeless tobacco: Never Used  . Alcohol use Yes    Prior to Admission medications   Medication Sig Start Date End Date Taking? Authorizing Provider  folic acid (FOLVITE) 800 MCG tablet Take 1 tablet (800 mcg total) by mouth daily. 02/21/14  Yes Massie MaroonLachina M Hollis, FNP  HYDROmorphone (DILAUDID) 4 MG  tablet Take 1 tablet (4 mg total) by mouth every 4 (four) hours as needed for severe pain. 06/20/16   Paula LibraJohn Loula Marcella, MD    Allergies Shellfish allergy   REVIEW OF SYSTEMS  Negative except as noted here or in the History of Present Illness.   PHYSICAL EXAMINATION  Initial Vital Signs Blood pressure 140/98, pulse 87, temperature 98.2 F (36.8 C), temperature source Oral, resp. rate 18, height 5\' 8"  (1.727 m), weight 145 lb (65.8 kg), SpO2 100 %.  Examination General: Well-developed, well-nourished male in no acute distress; appearance consistent with age of record HENT: normocephalic; atraumatic Eyes: pupils equal, round and reactive to light; extraocular muscles intact Neck: supple Heart: regular rate and rhythm Lungs: clear to auscultation bilaterally Abdomen: soft; nondistended; nontender; no masses or hepatosplenomegaly; bowel sounds present Extremities: No deformity; pulses normal; mild tenderness of left thigh without erythema or warmth Neurologic: Awake, alert and oriented; motor function intact in all extremities and symmetric; no facial droop Skin: Warm and dry Psychiatric: Normal mood and affect   RESULTS  Summary of this visit's results, reviewed by myself:   EKG Interpretation  Date/Time:    Ventricular Rate:    PR Interval:    QRS Duration:   QT Interval:    QTC Calculation:   R Axis:     Text Interpretation:        Laboratory Studies: Results for orders placed or performed during the  hospital encounter of 06/20/16 (from the past 24 hour(s))  Comprehensive metabolic panel     Status: Abnormal   Collection Time: 06/20/16 12:50 AM  Result Value Ref Range   Sodium 138 135 - 145 mmol/L   Potassium 3.7 3.5 - 5.1 mmol/L   Chloride 105 101 - 111 mmol/L   CO2 27 22 - 32 mmol/L   Glucose, Bld 100 (H) 65 - 99 mg/dL   BUN 10 6 - 20 mg/dL   Creatinine, Ser 1.61 0.61 - 1.24 mg/dL   Calcium 9.2 8.9 - 09.6 mg/dL   Total Protein 7.7 6.5 - 8.1 g/dL   Albumin 4.5 3.5  - 5.0 g/dL   AST 27 15 - 41 U/L   ALT 18 17 - 63 U/L   Alkaline Phosphatase 87 38 - 126 U/L   Total Bilirubin 1.7 (H) 0.3 - 1.2 mg/dL   GFR calc non Af Amer >60 >60 mL/min   GFR calc Af Amer >60 >60 mL/min   Anion gap 6 5 - 15  CBC with Differential     Status: Abnormal   Collection Time: 06/20/16 12:50 AM  Result Value Ref Range   WBC 8.9 4.0 - 10.5 K/uL   RBC 4.42 4.22 - 5.81 MIL/uL   Hemoglobin 12.1 (L) 13.0 - 17.0 g/dL   HCT 04.5 (L) 40.9 - 81.1 %   MCV 76.2 (L) 78.0 - 100.0 fL   MCH 27.4 26.0 - 34.0 pg   MCHC 35.9 30.0 - 36.0 g/dL   RDW 91.4 (H) 78.2 - 95.6 %   Platelets 140 (L) 150 - 400 K/uL   Neutrophils Relative % 63 %   Neutro Abs 5.6 1.7 - 7.7 K/uL   Lymphocytes Relative 15 %   Lymphs Abs 1.3 0.7 - 4.0 K/uL   Monocytes Relative 17 %   Monocytes Absolute 1.5 (H) 0.1 - 1.0 K/uL   Eosinophils Relative 5 %   Eosinophils Absolute 0.4 0.0 - 0.7 K/uL   Basophils Relative 0 %   Basophils Absolute 0.0 0.0 - 0.1 K/uL  Reticulocytes     Status: Abnormal   Collection Time: 06/20/16 12:50 AM  Result Value Ref Range   Retic Ct Pct 5.6 (H) 0.4 - 3.1 %   RBC. 4.36 4.22 - 5.81 MIL/uL   Retic Count, Manual 244.2 (H) 19.0 - 186.0 K/uL   Imaging Studies: No results found.  ED COURSE  Nursing notes and initial vitals signs, including pulse oximetry, reviewed.  Vitals:   06/20/16 0115 06/20/16 0130 06/20/16 0200 06/20/16 0300  BP: 124/89 119/79 119/69 (!) 93/53  Pulse: 73 82 95 83  Resp:      Temp:      TempSrc:      SpO2: 100% 99% 100% 95%  Weight:      Height:       3:14 AM Pain improved after IV fluids and medications.  PROCEDURES    ED DIAGNOSES     ICD-9-CM ICD-10-CM   1. Sickle cell pain crisis [facility]) 282.62 D57.00        Paula Libra, MD 06/20/16 2130    Paula Libra, MD 06/20/16 [id number]    Paula Libra, MD 06/20/16 [phone]

## 2016-06-21 ENCOUNTER — Telehealth (HOSPITAL_BASED_OUTPATIENT_CLINIC_OR_DEPARTMENT_OTHER): Payer: Self-pay | Admitting: *Deleted

## 2016-06-21 MED FILL — HYDROmorphone HCL 4 MG TABS: 4 | 2 days supply | Qty: 10 | Fill #0

## 2016-06-21 NOTE — Telephone Encounter (Signed)
Pt called states he was seen yesterday in ED and forgot to get note for work. Pt advised note would be left with registration for him to pick up.

## 2016-08-02 ENCOUNTER — Encounter (HOSPITAL_BASED_OUTPATIENT_CLINIC_OR_DEPARTMENT_OTHER): Payer: Self-pay | Admitting: *Deleted

## 2016-08-02 ENCOUNTER — Emergency Department (HOSPITAL_BASED_OUTPATIENT_CLINIC_OR_DEPARTMENT_OTHER)
Admission: EM | Admit: 2016-08-02 | Discharge: 2016-08-02 | Disposition: A | Discharge status: Home / Self Care | Payer: Medicaid Other | Attending: Emergency Medicine | Admitting: Emergency Medicine

## 2016-08-02 ENCOUNTER — Emergency Department (HOSPITAL_BASED_OUTPATIENT_CLINIC_OR_DEPARTMENT_OTHER): Payer: Medicaid Other

## 2016-08-02 DIAGNOSIS — Z87891 Personal history of nicotine dependence: Secondary | ICD-10-CM | POA: Diagnosis not present

## 2016-08-02 DIAGNOSIS — M542 Cervicalgia: Secondary | ICD-10-CM | POA: Diagnosis present

## 2016-08-02 DIAGNOSIS — D5700 Hb-SS disease with crisis, unspecified: Secondary | ICD-10-CM | POA: Diagnosis not present

## 2016-08-02 LAB — CBC WITH DIFFERENTIAL/PLATELET
Basophils Absolute: 0.1 10*3/uL (ref 0.0–0.1)
Basophils Relative: 1 %
EOS ABS: 0.1 10*3/uL (ref 0.0–0.7)
Eosinophils Relative: 1 %
HCT: 33 % — ABNORMAL LOW (ref 39.0–52.0)
HEMOGLOBIN: 12.2 g/dL — AB (ref 13.0–17.0)
LYMPHS ABS: 2.9 10*3/uL (ref 0.7–4.0)
Lymphocytes Relative: 34 %
MCH: 28.2 pg (ref 26.0–34.0)
MCHC: 37 g/dL — ABNORMAL HIGH (ref 30.0–36.0)
MCV: 76.2 fL — AB (ref 78.0–100.0)
MONOS PCT: 11 %
Monocytes Absolute: 0.9 10*3/uL (ref 0.1–1.0)
NEUTROS ABS: 4.7 10*3/uL (ref 1.7–7.7)
NEUTROS PCT: 55 %
Platelets: 178 10*3/uL (ref 150–400)
RBC: 4.33 MIL/uL (ref 4.22–5.81)
RDW: 14.9 % (ref 11.5–15.5)
WBC: 8.6 10*3/uL (ref 4.0–10.5)

## 2016-08-02 LAB — COMPREHENSIVE METABOLIC PANEL
ALBUMIN: 4.4 g/dL (ref 3.5–5.0)
ALK PHOS: 97 U/L (ref 38–126)
ALT: 25 U/L (ref 17–63)
AST: 39 U/L (ref 15–41)
Anion gap: 6 (ref 5–15)
BUN: 8 mg/dL (ref 6–20)
CALCIUM: 9.1 mg/dL (ref 8.9–10.3)
CO2: 27 mmol/L (ref 22–32)
CREATININE: 0.99 mg/dL (ref 0.61–1.24)
Chloride: 105 mmol/L (ref 101–111)
GFR calc Af Amer: >60 mL/min (ref >60)
GFR calc non Af Amer: >60 mL/min (ref >60)
GLUCOSE: 104 mg/dL — AB (ref 65–99)
Potassium: 4.1 mmol/L (ref 3.5–5.1)
Sodium: 138 mmol/L (ref 135–145)
Total Bilirubin: 1.8 mg/dL — ABNORMAL HIGH (ref 0.3–1.2)
Total Protein: 7.9 g/dL (ref 6.5–8.1)

## 2016-08-02 LAB — URINALYSIS, ROUTINE W REFLEX MICROSCOPIC
BILIRUBIN URINE: NEGATIVE
GLUCOSE, UA: NEGATIVE mg/dL
Hgb urine dipstick: NEGATIVE
KETONES UR: NEGATIVE mg/dL
Nitrite: NEGATIVE
PH: 7.5 (ref 5.0–8.0)
Protein, ur: NEGATIVE mg/dL
Specific Gravity, Urine: 1.012 (ref 1.005–1.030)

## 2016-08-02 LAB — RETICULOCYTES
RBC.: 4.31 MIL/uL (ref 4.22–5.81)
Retic Count, Absolute: 163.8 10*3/uL (ref 19.0–186.0)
Retic Ct Pct: 3.8 % — ABNORMAL HIGH (ref 0.4–3.1)

## 2016-08-02 LAB — URINALYSIS, MICROSCOPIC (REFLEX): RBC / HPF: NONE SEEN RBC/hpf (ref 0–5)

## 2016-08-02 LAB — LACTATE DEHYDROGENASE: LDH: 225 U/L — ABNORMAL HIGH (ref 98–192)

## 2016-08-02 MED ORDER — KETOROLAC TROMETHAMINE 15 MG/ML IJ SOLN
15.0000 mg | INTRAMUSCULAR | Status: AC
  Administered 2016-08-02: 15 mg via INTRAVENOUS
  Filled 2016-08-02: qty 1

## 2016-08-02 MED ORDER — SODIUM CHLORIDE 0.45 % IV SOLN
INTRAVENOUS | Status: DC
  Administered 2016-08-02: 17:00:00 via INTRAVENOUS

## 2016-08-02 MED ORDER — HYDROMORPHONE HCL 1 MG/ML IJ SOLN
1.0000 mg | INTRAMUSCULAR | Status: AC | PRN
  Administered 2016-08-02 (×3): 1 mg via INTRAVENOUS
  Filled 2016-08-02 (×3): qty 1

## 2016-08-02 MED ORDER — HYDROCODONE-ACETAMINOPHEN 5-325 MG PO TABS: 1.0000 | ORAL_TABLET | Freq: Four times a day (QID) | ORAL | 0 refills | Status: DC | PRN

## 2016-08-02 MED ORDER — HYDROMORPHONE HCL 1 MG/ML IJ SOLN
1.5000 mg | Freq: Once | INTRAMUSCULAR | Status: AC
Start: 2016-08-02 — End: 2016-08-02
  Administered 2016-08-02: 1.5 mg via INTRAVENOUS
  Filled 2016-08-02: qty 2

## 2016-08-02 NOTE — ED Triage Notes (Signed)
Pt reports that he is having a sickle cell crisis.  Generalized pain with 'heart' pain.  Pt ambulatory.  Noted to be talking on his phone in triage without distress.

## 2016-08-02 NOTE — ED Notes (Signed)
Patient transported to X-ray 

## 2016-08-02 NOTE — ED Notes (Signed)
Patient states he is having pain all over that is typical for his sickle cell pain, states his pain started at 10am today, and he is taking ibuprofen at home without relief. Patient states his pain has "spread to a 10 since he's had to wait in the ER". [facility], states he is currently out of his pain medication.

## 2016-08-02 NOTE — Discharge Instructions (Signed)
Follow these instructions at home: Drink enough fluid to keep your urine clear or pale yellow. Increase your fluid intake in hot weather and during exercise. Do not smoke. Smoking lowers oxygen levels in the blood. Only take over-the-counter or prescription medicines for pain, fever, or discomfort as directed by your health care provider. Take antibiotics as directed by your health care provider. Make sure you finish them it even if you start to feel better. Take supplements as directed by your health care provider. Consider wearing a medical alert bracelet. This tells anyone caring for you in an emergency of your condition. When traveling, keep your medical information, health care provider's names, and the medicines you take with you at all times. If you develop a fever, do not take medicines to reduce the fever right away. This could cover up a problem that is developing. Notify your health care provider. Keep all follow-up appointments with your health care provider. Sickle cell anemia requires regular medical care. Contact a health care provider if: You have a fever. Get help right away if: You feel dizzy or faint. You have new abdominal pain, especially on the left side near the stomach area. You develop a persistent, often uncomfortable and painful penile erection (priapism). If this is not treated immediately it will lead to impotence. You have numbness your arms or legs or you have a hard time moving them. You have a hard time with speech. You have a fever or persistent symptoms for more than 2-3 days. You have a fever and your symptoms suddenly get worse. You have signs or symptoms of infection. These include: Chills. Abnormal tiredness (lethargy). Irritability. Poor eating. Vomiting. You develop pain that is not helped with medicine. You develop shortness of breath. You have pain in your chest. You are coughing up pus-like or bloody sputum. You develop a stiff neck. Your  feet or hands swell or have pain. Your abdomen appears bloated. You develop joint pain.

## 2016-08-02 NOTE — ED Triage Notes (Deleted)
Urinary frequency and right sided flank pain x 2 weeks

## 2016-08-02 NOTE — ED Provider Notes (Signed)
[facility] Provider Note   CSN: [id number] Arrival date & time: 08/02/16  1459  By signing my name below, I, Arianna Nassar, attest that this documentation has been prepared under the direction and in the presence of Arthor Captain, PA-C.  Electronically Signed: Octavia Heir, ED Scribe. 08/02/16. 5:23 PM.    History   Chief Complaint Chief Complaint  Patient presents with  . Sickle Cell Pain Crisis   The history is provided by the patient. No language interpreter was used.   HPI Comments: William Jennings is a 26 y.o. male who has a PMhx of sickle cell anemia and acute chest syndrome presents to the Emergency Department complaining of a persistent, gradual worsening, sickle cell pain crisis that began this morning around 10 am. He expresses having pain in his neck, back, chest, and bilateral legs. Pt notes having acute chest pain in the past with his prior pain crisis but notes that it has not been for awhile. He believes that the pain triggered his pain crisis. He is on ibuprofen and 5-325 mg oxycodone that he takes q6h or prn for his pain. Pt says he ran out of oxycodone but has taken ibuprofen to alleviate his pain without relief. He denies fever.  Past Medical History:  Diagnosis Date  . Sickle cell disease [facility])     Patient Active Problem List   Diagnosis Date Noted  . Sickle cell anemia with pain (HCC) 04/05/2016  . Acute chest syndrome due to sickle cell crisis (HCC)   . Sickle cell anemia with crisis (HCC) 02/21/2014  . Sickle cell anemia (HCC) 08/10/2013  . Chest pain 08/10/2013  . Dehydration 08/08/2013  . Sickle cell crisis (HCC) 08/06/2013  . 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 CHOLECYSTECTOMY         Home Medications    Prior to Admission medications   Medication Sig Start Date End Date Taking? Authorizing Provider  folic acid  (FOLVITE) 800 MCG tablet Take 1 tablet (800 mcg total) by mouth daily. 02/21/14   Massie Maroon, FNP    Family History History reviewed. No pertinent family history.  Social History Social History  Substance Use Topics  . Smoking status: Former Games developer  . Smokeless tobacco: Never Used  . Alcohol use Yes     Allergies   Shellfish allergy   Review of Systems Review of Systems  A complete 10 system review of systems was obtained and all systems are negative except as noted in the HPI and PMH.    Physical Exam Updated Vital Signs BP 126/79 (BP Location: Right Arm)   Pulse 71   Temp 98.3 F (36.8 C) (Oral)   Resp 16   Ht 5\' 8"  (1.727 m)   Wt 65.8 kg   SpO2 100%   BMI 22.05 kg/m   Physical Exam  Constitutional: He is oriented to person, place, and time. He appears well-developed and well-nourished. No distress.  Pt is squirmish, appears uncomfortable  HENT:  Head: Normocephalic and atraumatic.  Eyes: Conjunctivae and EOM are normal. No scleral icterus.  Neck: Normal range of motion. Neck supple.  Cardiovascular: Normal rate, regular rhythm, normal heart sounds and intact distal pulses.   Pulmonary/Chest: Effort normal and breath sounds normal. No respiratory distress.  Abdominal: Soft. He exhibits no distension. There is no tenderness.  Musculoskeletal: Normal range of motion. He exhibits no edema.  Neurological: He is alert and oriented to person, place, and time.  Skin: Skin is warm and dry. He is not diaphoretic.  Psychiatric: He has a normal mood and affect. His behavior is normal. Judgment normal.  Nursing note and vitals reviewed.    ED Treatments / Results  DIAGNOSTIC STUDIES: Oxygen Saturation is 100% on RA, normal by my interpretation.  COORDINATION OF CARE:  5:05 PM Discussed treatment plan  with pt at bedside and pt agreed to plan.  Labs (all labs ordered are listed, but only abnormal results are displayed) Labs Reviewed  CBC WITH  DIFFERENTIAL/PLATELET  RETICULOCYTES  COMPREHENSIVE METABOLIC PANEL  URINALYSIS, ROUTINE W REFLEX MICROSCOPIC  LACTATE DEHYDROGENASE    EKG  EKG Interpretation None       Radiology No results found.  Procedures Procedures (including critical care time)  Medications Ordered in ED Medications  0.45 % sodium chloride infusion ( Intravenous New Bag/Given 08/02/16 1707)  HYDROmorphone (DILAUDID) injection 1 mg (1 mg Intravenous Given 08/02/16 1713)  ketorolac (TORADOL) 15 MG/ML injection 15 mg (15 mg Intravenous Given 08/02/16 1707)     Initial Impression / Assessment and Plan / ED Course  I have reviewed the triage vital signs and the nursing notes.  Pertinent labs & imaging results that were available during my care of the patient were reviewed by me and considered in my medical decision making (see chart for details).     Patient with apparent sickle cell crisis. Patient is hemolyzed. He has has an elevated reticulocyte count and LDH. He states that he is followed by Dr. Hyman HopesJegede, but has not been there in over 1 year. Patient does not have any pain medications at this time. He is feeling much better and has no signs of acute chest syndrome, sepsis, fever, chills, neurologic deficits. His labs are reassuring. Patient's pain is  4 out of 10, and initially was at 10 out of 10 pain. We'll discharge with a short course of Norco. Patient appears safe for discharge at this time. Discussed return precautions.  Final Clinical Impressions(s) / ED Diagnoses   Final diagnoses:  Sickle cell pain crisis (HCC)   I personally performed the services described in this documentation, which was scribed in my presence. The recorded information has been reviewed and is accurate.     New Prescriptions New Prescriptions   No medications on file     Arthor Captainbigail Jola Critzer, PA-C 08/03/16 0049    Loren Raceravid Yelverton, MD 08/03/16 [id number]

## 2016-08-02 NOTE — ED Notes (Signed)
HPPD/ security notified of pt not to drive d/t narc given

## 2016-08-02 NOTE — ED Notes (Signed)
Patient requested something for pain - notified patient's nurse

## 2016-08-03 ENCOUNTER — Telehealth (HOSPITAL_COMMUNITY): Payer: Self-pay | Admitting: *Deleted

## 2016-08-03 ENCOUNTER — Non-Acute Institutional Stay (HOSPITAL_COMMUNITY)
Admission: AD | Admit: 2016-08-03 | Discharge: 2016-08-03 | Disposition: A | Discharge status: Home / Self Care | Payer: Medicaid Other | Source: Ambulatory Visit | Attending: Internal Medicine | Admitting: Internal Medicine

## 2016-08-03 ENCOUNTER — Encounter (HOSPITAL_COMMUNITY): Payer: Self-pay | Admitting: Hematology

## 2016-08-03 DIAGNOSIS — D5700 Hb-SS disease with crisis, unspecified: Secondary | ICD-10-CM

## 2016-08-03 DIAGNOSIS — D57219 Sickle-cell/Hb-C disease with crisis, unspecified: Secondary | ICD-10-CM | POA: Diagnosis not present

## 2016-08-03 DIAGNOSIS — Z9049 Acquired absence of other specified parts of digestive tract: Secondary | ICD-10-CM | POA: Diagnosis not present

## 2016-08-03 DIAGNOSIS — Z79899 Other long term (current) drug therapy: Secondary | ICD-10-CM | POA: Insufficient documentation

## 2016-08-03 MED ORDER — NALOXONE HCL 0.4 MG/ML IJ SOLN: 0.4000 mg | INTRAMUSCULAR | Status: DC | PRN

## 2016-08-03 MED ORDER — ONDANSETRON HCL 4 MG/2ML IJ SOLN: 4.0000 mg | Freq: Four times a day (QID) | INTRAMUSCULAR | Status: DC | PRN

## 2016-08-03 MED ORDER — OXYCODONE HCL 5 MG PO TABS
5.0000 mg | ORAL_TABLET | Freq: Once | ORAL | Status: AC
  Administered 2016-08-03: 5 mg via ORAL
  Filled 2016-08-03: qty 1

## 2016-08-03 MED ORDER — KETOROLAC TROMETHAMINE 30 MG/ML IJ SOLN
30.0000 mg | Freq: Once | INTRAMUSCULAR | Status: AC
  Administered 2016-08-03: 30 mg via INTRAVENOUS
  Filled 2016-08-03: qty 1

## 2016-08-03 MED ORDER — HYDROMORPHONE HCL 2 MG/ML IJ SOLN
1.0000 mg | Freq: Once | INTRAMUSCULAR | Status: AC
  Administered 2016-08-03: 1 mg via INTRAVENOUS
  Filled 2016-08-03: qty 1

## 2016-08-03 MED ORDER — SODIUM CHLORIDE 0.9% FLUSH: 9.0000 mL | INTRAVENOUS | Status: DC | PRN

## 2016-08-03 MED ORDER — HYDROMORPHONE 1 MG/ML IV SOLN
INTRAVENOUS | Status: DC
  Administered 2016-08-03: 12:00:00 via INTRAVENOUS
  Administered 2016-08-03: 3.2 mg via INTRAVENOUS
  Filled 2016-08-03: qty 25

## 2016-08-03 MED ORDER — HYDROMORPHONE HCL 2 MG/ML IJ SOLN
2.0000 mg | Freq: Once | INTRAMUSCULAR | Status: AC
  Administered 2016-08-03: 2 mg via INTRAVENOUS
  Filled 2016-08-03: qty 1

## 2016-08-03 MED ORDER — HYDROMORPHONE HCL 2 MG/ML IJ SOLN
1.5000 mg | Freq: Once | INTRAMUSCULAR | Status: AC
  Administered 2016-08-03: 1.5 mg via INTRAVENOUS
  Filled 2016-08-03: qty 1

## 2016-08-03 MED ORDER — DIPHENHYDRAMINE HCL 12.5 MG/5ML PO ELIX: 12.5000 mg | ORAL_SOLUTION | Freq: Four times a day (QID) | ORAL | Status: DC | PRN

## 2016-08-03 MED ORDER — HYDROMORPHONE HCL 2 MG/ML IJ SOLN: 1.5000 mg | Freq: Once | INTRAMUSCULAR | Status: DC

## 2016-08-03 MED ORDER — DIPHENHYDRAMINE HCL 50 MG/ML IJ SOLN: 12.5000 mg | Freq: Four times a day (QID) | INTRAMUSCULAR | Status: DC | PRN

## 2016-08-03 MED ORDER — DEXTROSE-NACL 5-0.45 % IV SOLN
INTRAVENOUS | Status: DC
  Administered 2016-08-03: 09:00:00 via INTRAVENOUS

## 2016-08-03 NOTE — H&P (Signed)
[facility] History and Physical   Date: 08/03/2016  Patient name: William Jennings Medical record number: [id number] Date of birth: 1990/11/01 Age: 26 y.o. Gender: male PCP: No PCP Per Patient  Attending physician: Quentin Angstlugbemiga E Jegede, MD  Chief Complaint: Generalized pain  History of Present Illness: [name], a 26 year old male with a history of sickle cell anemia, HbSC presents complaining of generalized pain. He says that pain started several days ago. He attributes current pain crisis to changes in weather. He says that pain is 10/10. He was treated and evaluated in the emergency department on 08/02/2016, without sustained relief. He is opiate naive. He has been lost to follow up with primary provider and has not had opiate medications over the past several months. He last had Ibuprofen this am without relief. He denies headache, chest pain, shortness of breath, dysuria, nausea, vomiting, or diarrhea.    Meds: Prescriptions Prior to Admission  Medication Sig Dispense Refill Last Dose  . HYDROcodone-acetaminophen (NORCO) 5-325 MG tablet Take 1-2 tablets by mouth every 6 (six) hours as needed for moderate pain. 20 tablet 0 08/03/2016 at Unknown time  . folic acid (FOLVITE) 800 MCG tablet Take 1 tablet (800 mcg total) by mouth daily. 30 tablet 6 More than a month at Unknown time    Allergies: Shellfish allergy Past Medical History:  Diagnosis Date  . Sickle cell disease (HCC)    Past Surgical History:  Procedure Laterality Date  . CHOLECYSTECTOMY     No family history on file. Social History   Social History  . Marital status: Single    Spouse name: N/A  . Number of children: N/A  . Years of education: N/A   Occupational History  . Not on file.   Social History Main Topics  . Smoking status: Former Games developermoker  . Smokeless tobacco: Never Used  . Alcohol use Yes  . Drug use: No  . Sexual activity: Yes   Other Topics Concern  . Not on file    Social History Narrative  . No narrative on file    Review of Systems: Constitutional: positive for fatigue Eyes: negative Ears, nose, mouth, throat, and face: negative Respiratory: negative for cough and dyspnea on exertion Cardiovascular: negative for dyspnea, fatigue and lower extremity edema Gastrointestinal: negative for nausea Genitourinary:negative Hematologic/lymphatic: negative Musculoskeletal:positive for back pain, muscle weakness and myalgias Neurological: negative Behavioral/Psych: negative Endocrine: negative Allergic/Immunologic: negative  Physical Exam: Blood pressure 114/72, pulse 73, temperature 99.6 F (37.6 C), temperature source Oral, height 5\' 8"  (1.727 m), weight 145 lb (65.8 kg), SpO2 100 %. BP 128/88 (BP Location: Left Arm)   Pulse (!) 101   Temp 99.6 F (37.6 C) (Oral)   Resp 19   Ht 5\' 8"  (1.727 m)   Wt 145 lb (65.8 kg)   SpO2 96%   BMI 22.05 kg/m   General Appearance:    Alert, cooperative, moderate distress, appears stated age  Head:    Normocephalic, without obvious abnormality, atraumatic  Eyes:    PERRL, conjunctiva/corneas clear, EOM's intact, fundi    benign, both eyes       Ears:    Normal TM's and external ear canals, both ears  Nose:   Nares normal, septum midline, mucosa normal, no drainage    or sinus tenderness  Throat:   Lips, mucosa, and tongue normal; teeth and gums normal  Neck:   Supple, symmetrical, trachea midline, no adenopathy;       thyroid:  No enlargement/tenderness/nodules; no carotid   bruit or JVD  Back:     Symmetric, no curvature, ROM normal, no CVA tenderness  Lungs:     Clear to auscultation bilaterally, respirations unlabored  Chest wall:    No tenderness or deformity  Heart:    Regular rate and rhythm, S1 and S2 normal, no murmur, rub   or gallop  Abdomen:     Generalized tenderness on palpation,  bowel sounds active all four quadrants,    no masses, no organomegaly  Extremities:   Extremities normal,  atraumatic, no cyanosis or edema  Pulses:   2+ and symmetric all extremities  Skin:   Skin color, texture, turgor normal, no rashes or lesions  Lymph nodes:   Cervical, supraclavicular, and axillary nodes normal  Neurologic:   CNII-XII intact. Normal strength, sensation and reflexes      throughout    Lab results: Results for orders placed or performed during the hospital encounter of 08/02/16 (from the past 24 hour(s))  CBC WITH DIFFERENTIAL     Status: Abnormal   Collection Time: 08/02/16  5:00 PM  Result Value Ref Range   WBC 8.6 4.0 - 10.5 K/uL   RBC 4.33 4.22 - 5.81 MIL/uL   Hemoglobin 12.2 (L) 13.0 - 17.0 g/dL   HCT 16.1 (L) 09.6 - 04.5 %   MCV 76.2 (L) 78.0 - 100.0 fL   MCH 28.2 26.0 - 34.0 pg   MCHC 37.0 (H) 30.0 - 36.0 g/dL   RDW 40.9 81.1 - 91.4 %   Platelets 178 150 - 400 K/uL   Neutrophils Relative % 55 %   Neutro Abs 4.7 1.7 - 7.7 K/uL   Lymphocytes Relative 34 %   Lymphs Abs 2.9 0.7 - 4.0 K/uL   Monocytes Relative 11 %   Monocytes Absolute 0.9 0.1 - 1.0 K/uL   Eosinophils Relative 1 %   Eosinophils Absolute 0.1 0.0 - 0.7 K/uL   Basophils Relative 1 %   Basophils Absolute 0.1 0.0 - 0.1 K/uL   RBC Morphology TARGET CELLS   Reticulocytes     Status: Abnormal   Collection Time: 08/02/16  5:00 PM  Result Value Ref Range   Retic Ct Pct 3.8 (H) 0.4 - 3.1 %   RBC. 4.31 4.22 - 5.81 MIL/uL   Retic Count, Manual 163.8 19.0 - 186.0 K/uL  Comprehensive metabolic panel     Status: Abnormal   Collection Time: 08/02/16  5:00 PM  Result Value Ref Range   Sodium 138 135 - 145 mmol/L   Potassium 4.1 3.5 - 5.1 mmol/L   Chloride 105 101 - 111 mmol/L   CO2 27 22 - 32 mmol/L   Glucose, Bld 104 (H) 65 - 99 mg/dL   BUN 8 6 - 20 mg/dL   Creatinine, Ser 7.82 0.61 - 1.24 mg/dL   Calcium 9.1 8.9 - 95.6 mg/dL   Total Protein 7.9 6.5 - 8.1 g/dL   Albumin 4.4 3.5 - 5.0 g/dL   AST 39 15 - 41 U/L   ALT 25 17 - 63 U/L   Alkaline Phosphatase 97 38 - 126 U/L   Total Bilirubin 1.8 (H)  0.3 - 1.2 mg/dL   GFR calc non Af Amer >60 >60 mL/min   GFR calc Af Amer >60 >60 mL/min   Anion gap 6 5 - 15  Lactate dehydrogenase     Status: Abnormal   Collection Time: 08/02/16  5:00 PM  Result Value Ref Range   LDH  225 (H) 98 - 192 U/L  Urinalysis, Routine w reflex microscopic     Status: Abnormal   Collection Time: 08/02/16  7:18 PM  Result Value Ref Range   Color, Urine YELLOW YELLOW   APPearance CLEAR CLEAR   Specific Gravity, Urine 1.012 1.005 - 1.030   pH 7.5 5.0 - 8.0   Glucose, UA NEGATIVE NEGATIVE mg/dL   Hgb urine dipstick NEGATIVE NEGATIVE   Bilirubin Urine NEGATIVE NEGATIVE   Ketones, ur NEGATIVE NEGATIVE mg/dL   Protein, ur NEGATIVE NEGATIVE mg/dL   Nitrite NEGATIVE NEGATIVE   Leukocytes, UA TRACE (A) NEGATIVE  Urinalysis, Microscopic (reflex)     Status: Abnormal   Collection Time: 08/02/16  7:18 PM  Result Value Ref Range   RBC / HPF NONE SEEN 0 - 5 RBC/hpf   WBC, UA 6-30 0 - 5 WBC/hpf   Bacteria, UA FEW (A) NONE SEEN   Squamous Epithelial / LPF 0-5 (A) NONE SEEN    Imaging results:  Dg Chest 2 View  Result Date: 08/02/2016 CLINICAL DATA:  Acute onset of central chest pain. Sickle cell pain crisis. Initial encounter. EXAM: CHEST  2 VIEW COMPARISON:  Chest radiograph from 04/03/2016 FINDINGS: The lungs are well-aerated and clear. There is no evidence of focal opacification, pleural effusion or pneumothorax. The heart is normal in size; the mediastinal contour is within normal limits. No acute osseous abnormalities are seen. H-shaped vertebral bodies reflect the patient's sickle cell disease. IMPRESSION: No acute cardiopulmonary process seen. Electronically Signed   By: Roanna Raider M.D.   On: 08/02/2016 20:41     Assessment & Plan:  Patient will be admitted to the day infusion center for extended observation  Start IV D5.45 for cellular rehydration at 150/hr  Start Toradol 30 mg IV every 6 hours for inflammation.  Start Dilaudid PCA High Concentration  per weight based protocol.   Patient will be re-evaluated for pain intensity in the context of function and relationship to baseline as care progresses.  If no significant pain relief, will transfer patient to inpatient services for a higher level of care.   Reviewed labs, LDH elevated. All other labs consistent with baseline   Abra Lingenfelter M 08/03/2016, 9:22 AM

## 2016-08-03 NOTE — Telephone Encounter (Signed)
Pt called requesting to come [facility] for [facility]. Pt states his pain is all over and it is a 10/10. He last had his Ibuprofen at 6am. He denies fever, nausea, vomiting, diarrhea, abdominal pain or priapism. He states he went to the ED yesterday. Will put him on hold and check with the provider. Pt voiced understanding.  After speaking with the provider, pt was told that he could come in for treatment. Pt voiced understanding.

## 2016-08-03 NOTE — Discharge Summary (Signed)
[facility] Discharge Summary   Patient ID: William Jennings MRN: [id number] DOB/AGE: 1990/07/01 26 y.o.  Admit date: 08/03/2016 Discharge date: 08/03/2016  Primary Care Physician:  Massie MaroonHollis,Treavor Blomquist M, FNP  Admission Diagnoses:  Active Problems:   Sickle-cell/Hb-C disease with crisis [facility](HCC)   Discharge Medications:  Allergies as of 08/03/2016      Reactions   Shellfish Allergy Anaphylaxis      Medication List    TAKE these medications   folic acid 800 MCG tablet Commonly known as:  FOLVITE Take 1 tablet (800 mcg total) by mouth daily.   HYDROcodone-acetaminophen 5-325 MG tablet Commonly known as:  NORCO Take 1-2 tablets by mouth every 6 (six) hours as needed for moderate pain.        Consults:  None  Significant Diagnostic Studies:  Dg Chest 2 View  Result Date: 08/02/2016 CLINICAL DATA:  Acute onset of central chest pain. Sickle cell pain crisis. Initial encounter. EXAM: CHEST  2 VIEW COMPARISON:  Chest radiograph from 04/03/2016 FINDINGS: The lungs are well-aerated and clear. There is no evidence of focal opacification, pleural effusion or pneumothorax. The heart is normal in size; the mediastinal contour is within normal limits. No acute osseous abnormalities are seen. H-shaped vertebral bodies reflect the patient's sickle cell disease. IMPRESSION: No acute cardiopulmonary process seen. Electronically Signed   By: Roanna RaiderJeffery  Chang M.D.   On: 08/02/2016 20:41     [facility] Course: William Jennings was admitted to the day infusion center for a sickle cell pain crisis. Pt was treated with IVF, Toradol and IV analgesics. Pt was initially treated with weight based rapid re-doing and then transitioned to  PCA Dilaudid. He has total use of 3.2 mg  with 11 demands and 9 deliveries. Pain has decreased from 10 /10 to 4-5 /10. Pt will be discharged home in stable condition.   The patient was given clear instructions to go to ER or return to medical  center if symptoms do not improve, worsen or new problems develop. The patient verbalized understanding.  Physical Exam at Discharge:  BP 128/88 (BP Location: Left Arm)   Pulse (!) 101   Temp 99.6 F (37.6 C) (Oral)   Resp 19   Ht 5\' 8"  (1.727 m)   Wt 145 lb (65.8 kg)   SpO2 96%   BMI 22.05 kg/m    General Appearance:    Alert, cooperative, no distress, appears stated age  Head:    Normocephalic, without obvious abnormality, atraumatic  Eyes:    PERRL, conjunctiva/corneas clear, EOM's intact, fundi    benign, both eyes  Neck:   Supple, symmetrical, trachea midline, no adenopathy;    thyroid:  no enlargement/tenderness/nodules; no carotid   bruit or JVD  Back:     Symmetric, no curvature, ROM normal, no CVA tenderness  Lungs:     Clear to auscultation bilaterally, respirations unlabored  Chest Wall:    No tenderness or deformity   Heart:    Regular rate and rhythm, S1 and S2 normal, no murmur, rub   or gallop  Abdomen:     Soft, non-tender, bowel sounds active all four quadrants,    no masses, no organomegaly  Extremities:   Extremities normal, atraumatic, no cyanosis or edema  Pulses:   2+ and symmetric all extremities  Skin:   Skin color, texture, turgor normal, no rashes or lesions  Lymph nodes:   Cervical, supraclavicular, and axillary nodes normal  Neurologic:   CNII-XII intact,  normal strength, sensation and reflexes    throughout    Disposition at Discharge: 01-Home or Self Care  Discharge Orders: Discharge Instructions    Discharge patient    Complete by:  As directed    Discharge disposition:  01-Home or Self Care   Discharge patient date:  08/03/2016      Condition at Discharge:   Stable  Time spent on Discharge:  15 minutes  Signed: Areli Jowett M 08/03/2016, 3:07 PM

## 2016-08-03 NOTE — Progress Notes (Signed)
[facility] Patient came in c/o generalized pain and rated it 10/10 on pain scale. Patient received 3 doses of IV Dilaudid patient still c/o pain and rated it 7/10 on pain scale. Patient was then started on a Dilaudid PCA and received IV Toradol push. Patient reports pain level 6/10 at discharge. Discharge instructions given to patient and patient states an understanding. Patient alert, oriented and ambulatory at time of discharge.

## 2016-08-04 ENCOUNTER — Encounter (HOSPITAL_BASED_OUTPATIENT_CLINIC_OR_DEPARTMENT_OTHER): Payer: Self-pay | Admitting: *Deleted

## 2016-08-04 ENCOUNTER — Emergency Department (HOSPITAL_BASED_OUTPATIENT_CLINIC_OR_DEPARTMENT_OTHER)
Admission: EM | Admit: 2016-08-04 | Discharge: 2016-08-04 | Disposition: A | Discharge status: Home / Self Care | Payer: Medicaid Other | Attending: Emergency Medicine | Admitting: Emergency Medicine

## 2016-08-04 ENCOUNTER — Emergency Department (HOSPITAL_BASED_OUTPATIENT_CLINIC_OR_DEPARTMENT_OTHER): Payer: Medicaid Other

## 2016-08-04 DIAGNOSIS — Z87891 Personal history of nicotine dependence: Secondary | ICD-10-CM | POA: Diagnosis not present

## 2016-08-04 DIAGNOSIS — D5700 Hb-SS disease with crisis, unspecified: Secondary | ICD-10-CM

## 2016-08-04 DIAGNOSIS — D57219 Sickle-cell/Hb-C disease with crisis, unspecified: Secondary | ICD-10-CM | POA: Diagnosis not present

## 2016-08-04 DIAGNOSIS — R079 Chest pain, unspecified: Secondary | ICD-10-CM | POA: Diagnosis present

## 2016-08-04 LAB — COMPREHENSIVE METABOLIC PANEL
ALBUMIN: 4.6 g/dL (ref 3.5–5.0)
ALK PHOS: 177 U/L — AB (ref 38–126)
ALT: 40 U/L (ref 17–63)
AST: 61 U/L — ABNORMAL HIGH (ref 15–41)
Anion gap: 9 (ref 5–15)
BILIRUBIN TOTAL: 3 mg/dL — AB (ref 0.3–1.2)
BUN: 12 mg/dL (ref 6–20)
CALCIUM: 9.7 mg/dL (ref 8.9–10.3)
CO2: 23 mmol/L (ref 22–32)
CREATININE: 0.83 mg/dL (ref 0.61–1.24)
Chloride: 104 mmol/L (ref 101–111)
GFR calc Af Amer: >60 mL/min (ref >60)
GFR calc non Af Amer: >60 mL/min (ref >60)
GLUCOSE: 99 mg/dL (ref 65–99)
Potassium: 3.8 mmol/L (ref 3.5–5.1)
SODIUM: 136 mmol/L (ref 135–145)
TOTAL PROTEIN: 8.5 g/dL — AB (ref 6.5–8.1)

## 2016-08-04 LAB — CBC WITH DIFFERENTIAL/PLATELET
BASOS ABS: 0 10*3/uL (ref 0.0–0.1)
Basophils Relative: 0 %
Eosinophils Absolute: 0 10*3/uL (ref 0.0–0.7)
Eosinophils Relative: 0 %
HCT: 34.9 % — ABNORMAL LOW (ref 39.0–52.0)
HEMOGLOBIN: 12.9 g/dL — AB (ref 13.0–17.0)
LYMPHS ABS: 3.2 10*3/uL (ref 0.7–4.0)
Lymphocytes Relative: 24 %
MCH: 28.2 pg (ref 26.0–34.0)
MCHC: 37 g/dL — AB (ref 30.0–36.0)
MCV: 76.4 fL — AB (ref 78.0–100.0)
MONO ABS: 0.7 10*3/uL (ref 0.1–1.0)
MONOS PCT: 5 %
NEUTROS ABS: 9.4 10*3/uL — AB (ref 1.7–7.7)
Neutrophils Relative %: 71 %
PLATELETS: 158 10*3/uL (ref 150–400)
RBC: 4.57 MIL/uL (ref 4.22–5.81)
RDW: 14.9 % (ref 11.5–15.5)
WBC: 13.3 10*3/uL — AB (ref 4.0–10.5)

## 2016-08-04 LAB — RETICULOCYTES
RBC.: 4.59 MIL/uL (ref 4.22–5.81)
Retic Count, Absolute: 197.4 10*3/uL — ABNORMAL HIGH (ref 19.0–186.0)
Retic Ct Pct: 4.3 % — ABNORMAL HIGH (ref 0.4–3.1)

## 2016-08-04 MED ORDER — HYDROMORPHONE HCL 1 MG/ML IJ SOLN
0.5000 mg | INTRAMUSCULAR | Status: AC
  Administered 2016-08-04: 1 mg via INTRAVENOUS
  Filled 2016-08-04: qty 1

## 2016-08-04 MED ORDER — HYDROMORPHONE HCL 1 MG/ML IJ SOLN
0.5000 mg | INTRAMUSCULAR | Status: AC
Start: 2016-08-04 — End: 2016-08-04

## 2016-08-04 MED ORDER — HYDROMORPHONE HCL 1 MG/ML IJ SOLN: 0.5000 mg | INTRAMUSCULAR | Status: AC

## 2016-08-04 MED ORDER — DEXTROSE-NACL 5-0.45 % IV SOLN
INTRAVENOUS | Status: DC
  Administered 2016-08-04: 15:00:00 via INTRAVENOUS

## 2016-08-04 MED ORDER — DIPHENHYDRAMINE HCL 50 MG/ML IJ SOLN
25.0000 mg | Freq: Once | INTRAMUSCULAR | Status: AC
Start: 2016-08-04 — End: 2016-08-04
  Administered 2016-08-04: 25 mg via INTRAVENOUS
  Filled 2016-08-04: qty 1

## 2016-08-04 MED ORDER — ONDANSETRON HCL 4 MG/2ML IJ SOLN
4.0000 mg | INTRAMUSCULAR | Status: DC | PRN
  Administered 2016-08-04: 4 mg via INTRAVENOUS
  Filled 2016-08-04: qty 2

## 2016-08-04 MED ORDER — HYDROMORPHONE HCL 1 MG/ML IJ SOLN: 0.5000 mg | INTRAMUSCULAR | Status: DC

## 2016-08-04 MED ORDER — KETOROLAC TROMETHAMINE 30 MG/ML IJ SOLN
30.0000 mg | INTRAMUSCULAR | Status: AC
  Administered 2016-08-04: 30 mg via INTRAVENOUS
  Filled 2016-08-04: qty 1

## 2016-08-04 MED FILL — HYDROCODON-APAP 5-325: 5-325 | 3 days supply | Qty: 20 | Fill #0

## 2016-08-04 NOTE — ED Notes (Signed)
Pt ordering takeout, NAD noted

## 2016-08-04 NOTE — ED Provider Notes (Signed)
[facility] Provider Note   CSN: [id number] Arrival date & time: 08/04/16  1307     History   Chief Complaint Chief Complaint  Patient presents with  . Sickle Cell Pain Crisis    HPI William Jennings is a 26 y.o. male.  The history is provided by the patient.  Sickle Cell Pain Crisis  Location:  Back, upper extremity, lower extremity and chest Severity:  Moderate Onset quality:  Gradual Duration:  3 days Similar to previous crisis episodes: yes   Timing:  Constant Progression:  Waxing and waning Chronicity:  Recurrent Context: not alcohol consumption, not change in medication, not dehydration, not infection and not non-compliance   Relieved by:  Prescription drugs Associated symptoms: no chest pain, no cough, no fever, no nausea and no vomiting   Risk factors: prior acute chest     Past Medical History:  Diagnosis Date  . Sickle cell disease [facility])     Patient Active Problem List   Diagnosis Date Noted  . Sickle cell anemia with pain (HCC) 04/05/2016  . Acute chest syndrome due to sickle cell crisis (HCC)   . Sickle cell anemia with crisis (HCC) 02/21/2014  . Sickle cell anemia (HCC) 08/10/2013  . Chest pain 08/10/2013  . Dehydration 08/08/2013  . Sickle cell crisis (HCC) 08/06/2013  . 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 CHOLECYSTECTOMY         Home Medications    Prior to Admission medications   Medication Sig Start Date End Date Taking? Authorizing Provider  folic acid (FOLVITE) 800 MCG tablet Take 1 tablet (800 mcg total) by mouth daily. 02/21/14   Massie Maroon, FNP  HYDROcodone-acetaminophen (NORCO) 5-325 MG tablet Take 1-2 tablets by mouth every 6 (six) hours as needed for moderate pain. 08/02/16   Arthor Captain, PA-C    Family History History reviewed. No pertinent family history.  Social History Social History    Substance Use Topics  . Smoking status: Former Games developer  . Smokeless tobacco: Never Used  . Alcohol use Yes     Allergies   Shellfish allergy   Review of Systems Review of Systems  Constitutional: Negative for fever.  Respiratory: Negative for cough.   Cardiovascular: Negative for chest pain.  Gastrointestinal: Negative for nausea and vomiting.  Ten systems are reviewed and are negative for acute change except as noted in the HPI    Physical Exam Updated Vital Signs BP 134/97 (BP Location: Left Arm)   Pulse 96   Temp 97.9 F (36.6 C)   Resp 16   Ht 5\' 8"  (1.727 m)   Wt 145 lb (65.8 kg)   SpO2 99%   BMI 22.05 kg/m   Physical Exam  Constitutional: He is oriented to person, place, and time. He appears well-developed and well-nourished. No distress.  HENT:  Head: Normocephalic and atraumatic.  Nose: Nose normal.  Eyes: Conjunctivae and EOM are normal. Pupils are equal, round, and reactive to light. Right eye exhibits no discharge. Left eye exhibits no discharge. No scleral icterus.  Neck: Normal range of motion. Neck supple.  Cardiovascular: Normal rate and regular rhythm.  Exam reveals no gallop and no friction rub.   No murmur heard. Pulmonary/Chest: Effort normal and breath sounds normal. No stridor. No respiratory distress. He has no rales. He exhibits tenderness.    Abdominal: Soft. He exhibits no distension. There  is no tenderness.  Musculoskeletal: He exhibits no edema.       Right shoulder: He exhibits tenderness.       Left shoulder: He exhibits tenderness.       Thoracic back: He exhibits tenderness.       Back:       Right upper leg: He exhibits tenderness.       Left upper leg: He exhibits tenderness.       Right lower leg: He exhibits tenderness.       Left lower leg: He exhibits tenderness.  Neurological: He is alert and oriented to person, place, and time.  Skin: Skin is warm and dry. No rash noted. He is not diaphoretic. No erythema.   Psychiatric: He has a normal mood and affect.  Vitals reviewed.    ED Treatments / Results  Labs (all labs ordered are listed, but only abnormal results are displayed) Labs Reviewed  CBC WITH DIFFERENTIAL/PLATELET - Abnormal; Notable for the following:       Result Value   WBC 13.3 (*)    Hemoglobin 12.9 (*)    HCT 34.9 (*)    MCV 76.4 (*)    MCHC 37.0 (*)    Neutro Abs 9.4 (*)    All other components within normal limits  RETICULOCYTES - Abnormal; Notable for the following:    Retic Ct Pct 4.3 (*)    Retic Count, Manual 197.4 (*)    All other components within normal limits  COMPREHENSIVE METABOLIC PANEL - Abnormal; Notable for the following:    Total Protein 8.5 (*)    AST 61 (*)    Alkaline Phosphatase 177 (*)    Total Bilirubin 3.0 (*)    All other components within normal limits    EKG  EKG Interpretation None       Radiology Dg Chest 2 View  Result Date: 08/04/2016 CLINICAL DATA:  Sickle cell crisis.  Pain all over. EXAM: CHEST  2 VIEW COMPARISON:  08/02/2016 FINDINGS: The lungs are clear wiithout focal pneumonia, edema, pneumothorax or pleural effusion. The cardiopericardial silhouette is within normal limits for size. The visualized bony structures of the thorax are intact. Changes in the thoracolumbar spine compatible with reported history of sickle cell disease. IMPRESSION: No active cardiopulmonary disease. Electronically Signed   By: Kennith Center M.D.   On: 08/04/2016 14:30   Dg Chest 2 View  Result Date: 08/02/2016 CLINICAL DATA:  Acute onset of central chest pain. Sickle cell pain crisis. Initial encounter. EXAM: CHEST  2 VIEW COMPARISON:  Chest radiograph from 04/03/2016 FINDINGS: The lungs are well-aerated and clear. There is no evidence of focal opacification, pleural effusion or pneumothorax. The heart is normal in size; the mediastinal contour is within normal limits. No acute osseous abnormalities are seen. H-shaped vertebral bodies reflect the  patient's sickle cell disease. IMPRESSION: No acute cardiopulmonary process seen. Electronically Signed   By: Roanna Raider M.D.   On: 08/02/2016 20:41    Procedures Procedures (including critical care time)  Medications Ordered in ED Medications  dextrose 5 %-0.45 % sodium chloride infusion ( Intravenous New Bag/Given 08/04/16 1437)  HYDROmorphone (DILAUDID) injection 0.5-1 mg (not administered)    Or  HYDROmorphone (DILAUDID) injection 0.5-1 mg (not administered)  HYDROmorphone (DILAUDID) injection 0.5-1 mg (not administered)    Or  HYDROmorphone (DILAUDID) injection 0.5-1 mg (not administered)  ondansetron (ZOFRAN) injection 4 mg (4 mg Intravenous Given 08/04/16 1439)  ketorolac (TORADOL) 30 MG/ML injection 30 mg (30 mg  Intravenous Given 08/04/16 1441)  HYDROmorphone (DILAUDID) injection 0.5-1 mg (1 mg Intravenous Given 08/04/16 1443)    Or  HYDROmorphone (DILAUDID) injection 0.5-1 mg ( Subcutaneous See Alternative 08/04/16 1443)  HYDROmorphone (DILAUDID) injection 0.5-1 mg (1 mg Intravenous Given 08/04/16 1624)    Or  HYDROmorphone (DILAUDID) injection 0.5-1 mg ( Subcutaneous See Alternative 08/04/16 1624)  diphenhydrAMINE (BENADRYL) injection 25 mg (25 mg Intravenous Given 08/04/16 1438)     Initial Impression / Assessment and Plan / ED Course  I have reviewed the triage vital signs and the nursing notes.  Pertinent labs & imaging results that were available during my care of the patient were reviewed by me and considered in my medical decision making (see chart for details).     Per patient this is typical sickle cell crises. He was seen here 2 days ago for the same ruled out for acute chest and ACS. Labs grossly reassuring with only mild leukocytosis with stable hemoglobin and mildly elevated retake. No evidence of acute chest on chest x-ray. No evidence of aplastic or hemolytic crises. Vitamin with IV fluids and pain medicine. After the 2 doses patient had significant improvement  in his pain to the point where he order takeout from the room. He stated that he felt good enough to go home.  The patient is safe for discharge with strict return precautions.   Final Clinical Impressions(s) / ED Diagnoses   Final diagnoses:  Sickle cell pain crisis (HCC)   Disposition: Discharge  Condition: Good  I have discussed the results, Dx and Tx plan with the patient who expressed understanding and agree(s) with the plan. Discharge instructions discussed at great length. The patient was given strict return precautions who verbalized understanding of the instructions. No further questions at time of discharge.    New Prescriptions   No medications on file    Follow Up: Massie MaroonLachina M Hollis, FNP [address] [place] [address] [phone]  Schedule an appointment as soon as possible for a visit        Nira ConnPedro Eduardo Dixie Jafri, MD 08/04/16 [phone]

## 2016-08-04 NOTE — ED Triage Notes (Signed)
Pt c/o "sickle cell crisis" pt seen here 2 days ago and  [facility] yesterday for same

## 2016-08-05 ENCOUNTER — Encounter: Payer: Self-pay | Admitting: Family Medicine

## 2016-08-05 ENCOUNTER — Ambulatory Visit (INDEPENDENT_AMBULATORY_CARE_PROVIDER_SITE_OTHER): Payer: Medicaid Other | Admitting: Family Medicine

## 2016-08-05 ENCOUNTER — Non-Acute Institutional Stay (HOSPITAL_COMMUNITY)
Admission: AD | Admit: 2016-08-05 | Discharge: 2016-08-05 | Disposition: A | Discharge status: Home / Self Care | Payer: Medicaid Other | Source: Ambulatory Visit | Attending: Internal Medicine | Admitting: Internal Medicine

## 2016-08-05 ENCOUNTER — Encounter (HOSPITAL_COMMUNITY): Payer: Self-pay | Admitting: *Deleted

## 2016-08-05 VITALS — BP 134/77 | HR 116 | Temp 99.1°F | Resp 14 | Ht 68.0 in | Wt 141.0 lb

## 2016-08-05 DIAGNOSIS — Z79891 Long term (current) use of opiate analgesic: Secondary | ICD-10-CM

## 2016-08-05 DIAGNOSIS — D57219 Sickle-cell/Hb-C disease with crisis, unspecified: Secondary | ICD-10-CM

## 2016-08-05 DIAGNOSIS — Z79899 Other long term (current) drug therapy: Secondary | ICD-10-CM | POA: Insufficient documentation

## 2016-08-05 DIAGNOSIS — R82998 Other abnormal findings in urine: Secondary | ICD-10-CM

## 2016-08-05 DIAGNOSIS — Z87891 Personal history of nicotine dependence: Secondary | ICD-10-CM | POA: Diagnosis not present

## 2016-08-05 DIAGNOSIS — Z23 Encounter for immunization: Secondary | ICD-10-CM | POA: Diagnosis not present

## 2016-08-05 DIAGNOSIS — E559 Vitamin D deficiency, unspecified: Secondary | ICD-10-CM

## 2016-08-05 DIAGNOSIS — Z9049 Acquired absence of other specified parts of digestive tract: Secondary | ICD-10-CM | POA: Insufficient documentation

## 2016-08-05 DIAGNOSIS — D5700 Hb-SS disease with crisis, unspecified: Secondary | ICD-10-CM | POA: Diagnosis present

## 2016-08-05 DIAGNOSIS — R8299 Other abnormal findings in urine: Secondary | ICD-10-CM

## 2016-08-05 DIAGNOSIS — R52 Pain, unspecified: Secondary | ICD-10-CM | POA: Insufficient documentation

## 2016-08-05 LAB — POCT URINALYSIS DIP (DEVICE)
Bilirubin Urine: NEGATIVE
GLUCOSE, UA: NEGATIVE mg/dL
Hgb urine dipstick: NEGATIVE
KETONES UR: NEGATIVE mg/dL
Nitrite: NEGATIVE
PROTEIN: NEGATIVE mg/dL
Specific Gravity, Urine: 1.01 (ref 1.005–1.030)
Urobilinogen, UA: 4 mg/dL — ABNORMAL HIGH (ref 0.0–1.0)
pH: 6 (ref 5.0–8.0)

## 2016-08-05 MED ORDER — DIPHENHYDRAMINE HCL 25 MG PO CAPS: 25.0000 mg | ORAL_CAPSULE | ORAL | Status: DC | PRN

## 2016-08-05 MED ORDER — NALOXONE HCL 0.4 MG/ML IJ SOLN: 0.4000 mg | INTRAMUSCULAR | Status: DC | PRN

## 2016-08-05 MED ORDER — IBUPROFEN 600 MG PO TABS: 600.0000 mg | ORAL_TABLET | Freq: Three times a day (TID) | ORAL | 0 refills | Status: DC | PRN

## 2016-08-05 MED ORDER — HYDROMORPHONE 1 MG/ML IV SOLN
INTRAVENOUS | Status: DC
  Administered 2016-08-05: 8 mg via INTRAVENOUS
  Administered 2016-08-05: 13:00:00 via INTRAVENOUS
  Filled 2016-08-05: qty 25

## 2016-08-05 MED ORDER — DEXTROSE-NACL 5-0.45 % IV SOLN
INTRAVENOUS | Status: DC
  Administered 2016-08-05: 13:00:00 via INTRAVENOUS

## 2016-08-05 MED ORDER — ONDANSETRON HCL 4 MG/2ML IJ SOLN: 4.0000 mg | Freq: Four times a day (QID) | INTRAMUSCULAR | Status: DC | PRN

## 2016-08-05 MED ORDER — SODIUM CHLORIDE 0.9 % IV SOLN
25.0000 mg | INTRAVENOUS | Status: DC | PRN
  Filled 2016-08-05: qty 0.5

## 2016-08-05 MED ORDER — SODIUM CHLORIDE 0.9% FLUSH: 9.0000 mL | INTRAVENOUS | Status: DC | PRN

## 2016-08-05 MED ORDER — HYDROCODONE-ACETAMINOPHEN 10-325 MG PO TABS: 1.0000 | ORAL_TABLET | Freq: Four times a day (QID) | ORAL | 0 refills | Status: DC | PRN

## 2016-08-05 NOTE — Discharge Summary (Signed)
[facility] Discharge Summary   Patient ID: William Jennings MRN: [id number] DOB/AGE: 02-06-1991 26 y.o.  Admit date: 08/05/2016 Discharge date: 08/05/2016  Primary Care Physician:  Massie Maroon, FNP  Admission Diagnoses:  Active Problems:   Sickle cell anemia with crisis [facility])   Discharge Medications:  Allergies as of 08/05/2016      Reactions   Shellfish Allergy Anaphylaxis      Medication List    TAKE these medications   folic acid 800 MCG tablet Commonly known as:  FOLVITE Take 1 tablet (800 mcg total) by mouth daily.   HYDROcodone-acetaminophen 10-325 MG tablet Commonly known as:  NORCO Take 1 tablet by mouth every 6 (six) hours as needed.   ibuprofen 600 MG tablet Commonly known as:  ADVIL,MOTRIN Take 1 tablet (600 mg total) by mouth every 8 (eight) hours as needed.        Consults:  None  Significant Diagnostic Studies:  Dg Chest 2 View  Result Date: 08/04/2016 CLINICAL DATA:  Sickle cell crisis.  Pain all over. EXAM: CHEST  2 VIEW COMPARISON:  08/02/2016 FINDINGS: The lungs are clear wiithout focal pneumonia, edema, pneumothorax or pleural effusion. The cardiopericardial silhouette is within normal limits for size. The visualized bony structures of the thorax are intact. Changes in the thoracolumbar spine compatible with reported history of sickle cell disease. IMPRESSION: No active cardiopulmonary disease. Electronically Signed   By: Kennith Center M.D.   On: 08/04/2016 14:30   Dg Chest 2 View  Result Date: 08/02/2016 CLINICAL DATA:  Acute onset of central chest pain. Sickle cell pain crisis. Initial encounter. EXAM: CHEST  2 VIEW COMPARISON:  Chest radiograph from 04/03/2016 FINDINGS: The lungs are well-aerated and clear. There is no evidence of focal opacification, pleural effusion or pneumothorax. The heart is normal in size; the mediastinal contour is within normal limits. No acute osseous abnormalities are seen. H-shaped vertebral bodies  reflect the patient's sickle cell disease. IMPRESSION: No acute cardiopulmonary process seen. Electronically Signed   By: Roanna Raider M.D.   On: 08/02/2016 20:41     [facility] Course: [name] was transitioned to the day infusion center from primary care in sickle cell crisis. Patient was evaluated and treated in the ED on yesterday. Reviewed labs WBCs, LDH and bilirubin slightly elevated. Hemoglobin is consistent with baseline. Pt was treated with IVF and IV analgesics. Patient was treated with dilaudid per high concentration PCA. He has total use of 8 mg  with 19 demands and 16 deliveries. Pain has decreased from 7/10 to 4/10. Pt will be discharged home on Percocet 10-325 mg every 6 hours for moderate to severe pain as prescribed in primary care.   Patient is alert, oriented and ambulating.    The patient was given clear instructions to go to ER or return to medical center if symptoms do not improve, worsen or new problems develop. The patient verbalized understanding. Will notify patient with laboratory results.  Physical Exam at Discharge:  BP 130/68 (BP Location: Right Arm)   Pulse 98   Temp 98.7 F (37.1 C) (Oral)   Resp 16   SpO2 98%    General Appearance:    Alert, cooperative, no distress, appears stated age  Head:    Normocephalic, without obvious abnormality, atraumatic  Back:     Symmetric, no curvature, ROM normal, no CVA tenderness  Lungs:     Clear to auscultation bilaterally, respirations unlabored  Chest Wall:  No tenderness or deformity   Heart:    Regular rate and rhythm, S1 and S2 normal, no murmur, rub   or gallop  Extremities:   Extremities normal, atraumatic, no cyanosis or edema  Pulses:   2+ and symmetric all extremities  Skin:   Skin color, texture, turgor normal, no rashes or lesions  Lymph nodes:   Cervical, supraclavicular, and axillary nodes normal  Neurologic:   CNII-XII intact, normal strength, sensation and reflexes     throughout    Disposition at Discharge: 01-Home or Self Care  Discharge Orders: Discharge Instructions    Discharge patient    Complete by:  As directed    Discharge disposition:  01-Home or Self Care   Discharge patient date:  08/05/2016      Condition at Discharge:   Stable  Time spent on Discharge:  15 minutes  Signed: Yashira Offenberger M 08/05/2016, 3:42 PM

## 2016-08-05 NOTE — Progress Notes (Signed)
Subjective:    Patient ID: William JarvisGeovanni D Jennings, male    DOB: 1990/11/24, 26 y.o.   MRN: [id number]  HPI William Jennings, a 26 year old male with a history of sickle cell anemia, HbSC. Patient is having 7/10 generalized pain. Pain has been increased over the past 4 days. He attributes current pain crisis to changes in weather. Patient has been evaluated in the emergency department and the day infusion center without sustained relief. He has been managing pain at home with OTC Ibuprofen. He has been lost to follow up in primary care due to insurance constraints. He endorses headaches. Denies fatigue, shortness of breath, abdominal pain, dysuria, nausea, vomiting, diarrhea, or constipation.   Past Medical History:  Diagnosis Date  . Sickle cell disease (HCC)    Social History   Social History  . Marital status: Single    Spouse name: N/A  . Number of children: N/A  . Years of education: N/A   Occupational History  . Not on file.   Social History Main Topics  . Smoking status: Former Games developermoker  . Smokeless tobacco: Never Used  . Alcohol use Yes  . Drug use: No  . Sexual activity: Yes   Other Topics Concern  . Not on file   Social History Narrative  . No narrative on file   There is no immunization history on file for this patient. Review of Systems  Constitutional: Negative for fatigue.  HENT: Negative.   Eyes: Negative.   Respiratory: Negative.   Cardiovascular: Negative for palpitations and leg swelling.  Gastrointestinal: Negative.   Endocrine: Negative.  Negative for polydipsia, polyphagia and polyuria.  Genitourinary: Negative.   Musculoskeletal: Positive for back pain and myalgias.  Allergic/Immunologic: Negative.   Neurological: Positive for weakness and headaches. Negative for dizziness, syncope, facial asymmetry, light-headedness and numbness.  Psychiatric/Behavioral: Negative.        Objective:   Physical Exam  Constitutional: He is oriented to person, place,  and time. He appears well-developed and well-nourished. He appears ill.  HENT:  Head: Normocephalic and atraumatic.  Right Ear: Hearing and external ear normal.  Mouth/Throat: Oropharynx is clear and moist.  Eyes: Conjunctivae and EOM are normal. Pupils are equal, round, and reactive to light.  Neck: Normal range of motion. Neck supple.  Cardiovascular: Normal rate, regular rhythm, normal heart sounds and intact distal pulses.   Pulmonary/Chest: Effort normal and breath sounds normal.  Abdominal: Soft. Bowel sounds are normal.  Musculoskeletal: Normal range of motion.  Neurological: He is alert and oriented to person, place, and time. He has normal reflexes. No cranial nerve deficit. Coordination normal.  Skin: Skin is warm and dry.  Psychiatric: He has a normal mood and affect. His behavior is normal. Judgment and thought content normal.      BP 134/77 (BP Location: Right Arm, Patient Position: Sitting, Cuff Size: Normal)   Pulse (!) 116   Temp 99.1 F (37.3 C) (Oral)   Resp 14   Ht 5\' 8"  (1.727 m)   Wt 141 lb (64 kg)   SpO2 100%   BMI 21.44 kg/m  Assessment & Plan:  1. Sickle-cell/Hb-C disease with crisis [facility](HCC) Patient will transition to day infusion center for extended observation.  Patient will be admitted to the day infusion center for extended observation  Start IV D5.45 for cellular rehydration at 125/hr  Start Toradol 30 mg IV times 1  Start Dilaudid PCA High Concentration per weight based protocol.   Patient will be re-evaluated  for pain intensity in the context of function and relationship to baseline as care progresses.  If no significant pain relief, will transfer patient to inpatient services for a higher level of care.  Will start folic acid 1 mg daily to prevent aplastic bone marrow crises.   Pulmonary evaluation - Patient denies severe recurrent wheezes, shortness of breath with exercise, or persistent cough. If these symptoms develop, pulmonary function  tests with spirometry will be ordered, and if abnormal, plan on referral to Pulmonology for further evaluation.  Cardiac - Routine screening for pulmonary hypertension is not recommended. Reviewed most recent EKG from ER  Eye - High risk of proliferative retinopathy. Annual eye exam with retinal exam recommended to patient.  Immunization status - Tdap today  Acute and chronic painful episodes -  Reviewed [place] Substance Reporting system prior to prescribing opiate medications, no inconsistencies noted.   - HYDROcodone-acetaminophen (NORCO) 10-325 MG tablet; Take 1 tablet by mouth every 6 (six) hours as needed.  Dispense: 60 tablet; Refill: 0 - ibuprofen (ADVIL,MOTRIN) 600 MG tablet; Take 1 tablet (600 mg total) by mouth every 8 (eight) hours as needed.  Dispense: 30 tablet; Refill: 0 - Vitamin D, 25-hydroxy - Hemoglobinopathy Evaluation - Ambulatory referral to Ophthalmology  2. Vitamin D deficiency Vitamin D, 25-hydroxy  3. Urine leukocytes - Urine culture  4. Need for Tdap vaccination  - Tdap vaccine greater than or equal to 7yo IM  5. Chronic prescription opiate use - Pain Mgmt, Profile 8 w/Conf, U    RTC: 1 month for medication management   Massie Maroon, FNP

## 2016-08-05 NOTE — Progress Notes (Signed)
Pt discharged to home; discharge instructions explained, given, and signed; all questions answered; pt alert, oriented, and ambulatory; no complications noted 

## 2016-08-05 NOTE — H&P (Signed)
[facility] History and Physical   Date: 08/05/2016  Patient name: William Jennings Medical record number: [id number] Date of birth: 1991-03-12 Age: 26 y.o. Gender: male PCP: Massie Maroon, FNP  Attending physician: Quentin Angst, MD  Chief Complaint: Generalized pain  History of Present Illness: [name], a 26 year old male with a history of sickle cell anemia, HbSC presents complaining of generalized pain. He says that pain started several days ago. He attributes current pain crisis to changes in weather. He says that pain is 10/10. He was treated and evaluated in the emergency department on 08/04/2016, without sustained relief. He last had hydrocodone 5-325 around 7 am with minimal relief. He is transitioning from primary care for further workup and evaluation. He endorses headache. Patient denies chest pain, shortness of breath, dysuria, nausea, vomiting, or diarrhea.    Meds: Prescriptions Prior to Admission  Medication Sig Dispense Refill Last Dose  . folic acid (FOLVITE) 800 MCG tablet Take 1 tablet (800 mcg total) by mouth daily. (Patient not taking: Reported on 08/05/2016) 30 tablet 6 Not Taking  . HYDROcodone-acetaminophen (NORCO) 10-325 MG tablet Take 1 tablet by mouth every 6 (six) hours as needed. 60 tablet 0   . ibuprofen (ADVIL,MOTRIN) 600 MG tablet Take 1 tablet (600 mg total) by mouth every 8 (eight) hours as needed. 30 tablet 0     Allergies: Shellfish allergy Past Medical History:  Diagnosis Date  . Sickle cell disease (HCC)    Past Surgical History:  Procedure Laterality Date  . CHOLECYSTECTOMY     No family history on file. Social History   Social History  . Marital status: Single    Spouse name: N/A  . Number of children: N/A  . Years of education: N/A   Occupational History  . Not on file.   Social History Main Topics  . Smoking status: Former Games developer  . Smokeless tobacco: Never Used  . Alcohol use Yes  . Drug use:  No  . Sexual activity: Yes   Other Topics Concern  . Not on file   Social History Narrative  . No narrative on file    Review of Systems: Constitutional: positive for fatigue Eyes: negative Ears, nose, mouth, throat, and face: negative Respiratory: negative for cough and dyspnea on exertion Cardiovascular: negative for dyspnea, fatigue and lower extremity edema Gastrointestinal: negative for nausea Genitourinary:negative Hematologic/lymphatic: negative Musculoskeletal:positive for back pain, muscle weakness and myalgias Neurological: negative Behavioral/Psych: negative Endocrine: negative Allergic/Immunologic: negative  Physical Exam: There were no vitals taken for this visit. There were no vitals taken for this visit.  General Appearance:    Alert, cooperative, moderate distress, appears stated age  Head:    Normocephalic, without obvious abnormality, atraumatic  Eyes:    PERRL, conjunctiva/corneas clear, EOM's intact, fundi    benign, both eyes       Ears:    Normal TM's and external ear canals, both ears  Nose:   Nares normal, septum midline, mucosa normal, no drainage    or sinus tenderness  Throat:   Lips, mucosa, and tongue normal; teeth and gums normal  Neck:   Supple, symmetrical, trachea midline, no adenopathy;       thyroid:  No enlargement/tenderness/nodules; no carotid   bruit or JVD  Back:     Symmetric, no curvature, ROM normal, no CVA tenderness  Lungs:     Clear to auscultation bilaterally, respirations unlabored  Chest wall:    No tenderness or deformity  Heart:    Regular rate and rhythm, S1 and S2 normal, no murmur, rub   or gallop  Abdomen:     No abdominal tenderness,  bowel sounds active all four quadrants,    no masses, no organomegaly  Extremities:   Extremities normal, atraumatic, no cyanosis or edema  Pulses:   2+ and symmetric all extremities  Skin:   Skin color, texture, turgor normal, no rashes or lesions  Lymph nodes:   Cervical,  supraclavicular, and axillary nodes normal  Neurologic:   CNII-XII intact. Normal strength, sensation and reflexes      throughout    Lab results: Results for orders placed or performed during the hospital encounter of 08/04/16 (from the past 24 hour(s))  CBC WITH DIFFERENTIAL     Status: Abnormal   Collection Time: 08/04/16  2:27 PM  Result Value Ref Range   WBC 13.3 (H) 4.0 - 10.5 K/uL   RBC 4.57 4.22 - 5.81 MIL/uL   Hemoglobin 12.9 (L) 13.0 - 17.0 g/dL   HCT 16.134.9 (L) 09.639.0 - 04.552.0 %   MCV 76.4 (L) 78.0 - 100.0 fL   MCH 28.2 26.0 - 34.0 pg   MCHC 37.0 (H) 30.0 - 36.0 g/dL   RDW 40.914.9 81.111.5 - 91.415.5 %   Platelets 158 150 - 400 K/uL   Neutrophils Relative % 71 %   Lymphocytes Relative 24 %   Monocytes Relative 5 %   Eosinophils Relative 0 %   Basophils Relative 0 %   Neutro Abs 9.4 (H) 1.7 - 7.7 K/uL   Lymphs Abs 3.2 0.7 - 4.0 K/uL   Monocytes Absolute 0.7 0.1 - 1.0 K/uL   Eosinophils Absolute 0.0 0.0 - 0.7 K/uL   Basophils Absolute 0.0 0.0 - 0.1 K/uL   RBC Morphology STOMATOCYTES    WBC Morphology ATYPICAL LYMPHOCYTES    Smear Review LARGE PLATELETS PRESENT   Reticulocytes     Status: Abnormal   Collection Time: 08/04/16  2:27 PM  Result Value Ref Range   Retic Ct Pct 4.3 (H) 0.4 - 3.1 %   RBC. 4.59 4.22 - 5.81 MIL/uL   Retic Count, Manual 197.4 (H) 19.0 - 186.0 K/uL  Comprehensive metabolic panel     Status: Abnormal   Collection Time: 08/04/16  2:27 PM  Result Value Ref Range   Sodium 136 135 - 145 mmol/L   Potassium 3.8 3.5 - 5.1 mmol/L   Chloride 104 101 - 111 mmol/L   CO2 23 22 - 32 mmol/L   Glucose, Bld 99 65 - 99 mg/dL   BUN 12 6 - 20 mg/dL   Creatinine, Ser 7.820.83 0.61 - 1.24 mg/dL   Calcium 9.7 8.9 - 95.610.3 mg/dL   Total Protein 8.5 (H) 6.5 - 8.1 g/dL   Albumin 4.6 3.5 - 5.0 g/dL   AST 61 (H) 15 - 41 U/L   ALT 40 17 - 63 U/L   Alkaline Phosphatase 177 (H) 38 - 126 U/L   Total Bilirubin 3.0 (H) 0.3 - 1.2 mg/dL   GFR calc non Af Amer >60 >60 mL/min   GFR calc Af  Amer >60 >60 mL/min   Anion gap 9 5 - 15    Imaging results:  Dg Chest 2 View  Result Date: 08/04/2016 CLINICAL DATA:  Sickle cell crisis.  Pain all over. EXAM: CHEST  2 VIEW COMPARISON:  08/02/2016 FINDINGS: The lungs are clear wiithout focal pneumonia, edema, pneumothorax or pleural effusion. The cardiopericardial silhouette is within normal limits for  size. The visualized bony structures of the thorax are intact. Changes in the thoracolumbar spine compatible with reported history of sickle cell disease. IMPRESSION: No active cardiopulmonary disease. Electronically Signed   By: Kennith Center M.D.   On: 08/04/2016 14:30     Assessment & Plan:  Patient will be admitted to the day infusion center for extended observation  Start IV D5.45 for cellular rehydration at 125/hr  Start Toradol 30 mg IV every 6 hours for inflammation.  Start Dilaudid PCA High Concentration per weight based protocol.   Patient will be re-evaluated for pain intensity in the context of function and relationship to baseline as care progresses.  If no significant pain relief, will transfer patient to inpatient services for a higher level of care.   Reviewed labs and EKG from emergency room on 08/05/2016   Koray Soter M 08/05/2016, 12:12 PM

## 2016-08-05 NOTE — Discharge Instructions (Signed)
Sickle Cell Anemia, Adult °Sickle cell anemia is a condition where your red blood cells are shaped like sickles. Red blood cells carry oxygen through the body. Sickle-shaped red blood cells do not live as long as normal red blood cells. They also clump together and block blood from flowing through the blood vessels. These things prevent the body from getting enough oxygen. Sickle cell anemia causes organ damage and pain. It also increases the risk of infection. °Follow these instructions at home: °· Drink enough fluid to keep your pee (urine) clear or pale yellow. Drink more in hot weather and during exercise. °· Do not smoke. Smoking lowers oxygen levels in the blood. °· Only take over-the-counter or prescription medicines as told by your doctor. °· Take antibiotic medicines as told by your doctor. Make sure you finish them even if you start to feel better. °· Take supplements as told by your doctor. °· Consider wearing a medical alert bracelet. This tells anyone caring for you in an emergency of your condition. °· When traveling, keep your medical information, doctors' names, and the medicines you take with you at all times. °· If you have a fever, do not take fever medicines right away. This could cover up a problem. Tell your doctor. °· Keep all follow-up visits with your doctor. Sickle cell anemia requires regular medical care. °Contact a doctor if: °You have a fever. °Get help right away if: °· You feel dizzy or faint. °· You have new belly (abdominal) pain, especially on the left side near the stomach area. °· You have a lasting, often uncomfortable and painful erection of the penis (priapism). If it is not treated right away, you will become unable to have sex (impotence). °· You have numbness in your arms or legs or you have a hard time moving them. °· You have a hard time talking. °· You have a fever or lasting symptoms for more than 2-3 days. °· You have a fever and your symptoms suddenly get  worse. °· You have signs or symptoms of infection. These include: °? Chills. °? Being more tired than normal (lethargy). °? Irritability. °? Poor eating. °? Throwing up (vomiting). °· You have pain that is not helped with medicine. °· You have shortness of breath. °· You have pain in your chest. °· You are coughing up pus-like or bloody mucus. °· You have a stiff neck. °· Your feet or hands swell or have pain. °· Your belly looks bloated. °· Your joints hurt. °This information is not intended to replace advice given to you by your health care provider. Make sure you discuss any questions you have with your health care provider. °Document Released: 03/21/2013 Document Revised: 11/06/2015 Document Reviewed: 01/10/2013 °Elsevier Interactive Patient Education © 2017 Elsevier Inc. ° °

## 2016-08-06 LAB — VITAMIN D 25 HYDROXY (VIT D DEFICIENCY, FRACTURES): VIT D 25 HYDROXY: 6 ng/mL — AB (ref 30–100)

## 2016-08-06 NOTE — Patient Instructions (Addendum)
Sickle Cell Anemia, Adult Sickle cell anemia is a condition where your red blood cells are shaped like sickles. Red blood cells carry oxygen through the body. Sickle-shaped red blood cells do not live as long as normal red blood cells. They also clump together and block blood from flowing through the blood vessels. These things prevent the body from getting enough oxygen. Sickle cell anemia causes organ damage and pain. It also increases the risk of infection. Follow these instructions at home:  Drink enough fluid to keep your pee (urine) clear or pale yellow. Drink more in hot weather and during exercise.  Do not smoke. Smoking lowers oxygen levels in the blood.  Only take over-the-counter or prescription medicines as told by your doctor.  Take antibiotic medicines as told by your doctor. Make sure you finish them even if you start to feel better.  Take supplements as told by your doctor.  Consider wearing a medical alert bracelet. This tells anyone caring for you in an emergency of your condition.  When traveling, keep your medical information, doctors' names, and the medicines you take with you at all times.  If you have a fever, do not take fever medicines right away. This could cover up a problem. Tell your doctor.  Keep all follow-up visits with your doctor. Sickle cell anemia requires regular medical care. Contact a doctor if: You have a fever. Get help right away if:  You feel dizzy or faint.  You have new belly (abdominal) pain, especially on the left side near the stomach area.  You have a lasting, often uncomfortable and painful erection of the penis (priapism). If it is not treated right away, you will become unable to have sex (impotence).  You have numbness in your arms or legs or you have a hard time moving them.  You have a hard time talking.  You have a fever or lasting symptoms for more than 2-3 days.  You have a fever and your symptoms suddenly get  worse.  You have signs or symptoms of infection. These include:  Chills.  Being more tired than normal (lethargy).  Irritability.  Poor eating.  Throwing up (vomiting).  You have pain that is not helped with medicine.  You have shortness of breath.  You have pain in your chest.  You are coughing up pus-like or bloody mucus.  You have a stiff neck.  Your feet or hands swell or have pain.  Your belly looks bloated.  Your joints hurt. This information is not intended to replace advice given to you by your health care provider. Make sure you discuss any questions you have with your health care provider. Document Released: 03/21/2013 Document Revised: 11/06/2015 Document Reviewed: 01/10/2013 Elsevier Interactive Patient Education  2017 Elsevier Inc.  Vitamin D Deficiency Introduction Vitamin D deficiency is when your body does not have enough vitamin D. Vitamin D is important because:  It helps your body use other minerals that your body needs.  It helps keep your bones strong and healthy.  It may help to prevent some diseases.  It helps your heart and other muscles work well. You can get vitamin D by:  Eating foods with vitamin D in them.  Drinking or eating milk or other foods that have had vitamin D added to them.  Taking a vitamin D supplement.  Being in the sun. Not getting enough vitamin D can make your bones become soft. It can also cause other health problems. Follow these instructions at home:  Take medicines and supplements only as told by your doctor.  Eat foods that have vitamin D. These include:  Dairy products, cereals, or juices with added vitamin D. Check the label for vitamin D.  Fatty fish like salmon or trout.  Eggs.  Oysters.  Do not use tanning beds.  Stay at a healthy weight. Lose weight, if needed.  Keep all follow-up visits as told by your doctor. This is important. Contact a doctor if:  Your symptoms do not go  away.  You feel sick to your stomach (nauseous).  Youthrow up (vomit).  You poop less often than usual or you have trouble pooping (constipation). This information is not intended to replace advice given to you by your health care provider. Make sure you discuss any questions you have with your health care provider. Document Released: 05/20/2011 Document Revised: 11/06/2015 Document Reviewed: 10/16/2014  2017 Elsevier

## 2016-08-09 LAB — HEMOGLOBINOPATHY EVALUATION
HCT: 35.8 % — ABNORMAL LOW (ref 38.5–50.0)
HEMOGLOBIN C  MAP: 44.8 % — AB
HGB A2 QUANT: 4.5 % — AB (ref 1.8–3.5)
HGB A: 0 % — AB (ref >96.0)
Hemoglobin: 11.8 g/dL — ABNORMAL LOW (ref 13.2–17.1)
Hgb S Quant: 49.7 % — ABNORMAL HIGH
MCH: 27.7 pg (ref 27.0–33.0)
MCV: 84 fL (ref 80.0–100.0)
RBC: 4.26 MIL/uL (ref 4.20–5.80)
RDW: 15.6 % — ABNORMAL HIGH (ref 11.0–15.0)

## 2016-08-09 LAB — PAIN MGMT, PROFILE 8 W/CONF, U
6 Acetylmorphine: NEGATIVE ng/mL (ref <10)
Alcohol Metabolites: NEGATIVE ng/mL (ref <500)
Amphetamines: NEGATIVE ng/mL (ref <500)
BENZODIAZEPINES: NEGATIVE ng/mL (ref <100)
BUPRENORPHINE: NEGATIVE ng/mL (ref <5)
CREATININE: 129.1 mg/dL (ref >=20.0)
Cocaine Metabolite: NEGATIVE ng/mL (ref <150)
Codeine: NEGATIVE ng/mL (ref <50)
HYDROMORPHONE: 2124 ng/mL — AB (ref <50)
Hydrocodone: 539 ng/mL — ABNORMAL HIGH (ref <50)
MARIJUANA METABOLITE: 354 ng/mL — AB (ref <5)
MDMA: NEGATIVE ng/mL (ref <500)
MORPHINE: NEGATIVE ng/mL (ref <50)
Marijuana Metabolite: POSITIVE ng/mL — AB (ref <20)
Norhydrocodone: 953 ng/mL — ABNORMAL HIGH (ref <50)
OPIATES: POSITIVE ng/mL — AB (ref <100)
OXIDANT: NEGATIVE ug/mL (ref <200)
Oxycodone: NEGATIVE ng/mL (ref <100)
PH: 6.69 (ref 4.5–9.0)
Please note:: 0

## 2016-08-09 LAB — URINE CULTURE

## 2016-11-26 ENCOUNTER — Encounter (HOSPITAL_COMMUNITY): Payer: Self-pay | Admitting: *Deleted

## 2016-11-26 ENCOUNTER — Other Ambulatory Visit: Payer: Self-pay | Admitting: Family Medicine

## 2016-11-26 ENCOUNTER — Telehealth (HOSPITAL_COMMUNITY): Payer: Self-pay | Admitting: *Deleted

## 2016-11-26 ENCOUNTER — Non-Acute Institutional Stay (HOSPITAL_COMMUNITY)
Admission: AD | Admit: 2016-11-26 | Discharge: 2016-11-26 | Disposition: A | Discharge status: Home / Self Care | Payer: Medicaid Other | Source: Ambulatory Visit | Attending: Internal Medicine | Admitting: Internal Medicine

## 2016-11-26 DIAGNOSIS — D57219 Sickle-cell/Hb-C disease with crisis, unspecified: Secondary | ICD-10-CM

## 2016-11-26 DIAGNOSIS — D5700 Hb-SS disease with crisis, unspecified: Secondary | ICD-10-CM

## 2016-11-26 DIAGNOSIS — Z87891 Personal history of nicotine dependence: Secondary | ICD-10-CM | POA: Diagnosis not present

## 2016-11-26 HISTORY — DX: Sickle-cell disease without crisis: D57.1

## 2016-11-26 LAB — CBC WITH DIFFERENTIAL/PLATELET
Band Neutrophils: 0 %
Basophils Absolute: 0 10*3/uL (ref 0.0–0.1)
Basophils Relative: 0 %
Blasts: 0 %
Eosinophils Absolute: 0 10*3/uL (ref 0.0–0.7)
Eosinophils Relative: 0 %
HCT: 30.9 % — ABNORMAL LOW (ref 39.0–52.0)
Hemoglobin: 11.4 g/dL — ABNORMAL LOW (ref 13.0–17.0)
Lymphocytes Relative: 42 %
Lymphs Abs: 3.5 10*3/uL (ref 0.7–4.0)
MCH: 28.5 pg (ref 26.0–34.0)
MCHC: 36.9 g/dL — ABNORMAL HIGH (ref 30.0–36.0)
MCV: 77.3 fL — ABNORMAL LOW (ref 78.0–100.0)
Metamyelocytes Relative: 0 %
Monocytes Absolute: 1 10*3/uL (ref 0.1–1.0)
Monocytes Relative: 12 %
Myelocytes: 0 %
Neutro Abs: 3.2 10*3/uL (ref 1.7–7.7)
Neutrophils Relative %: 38 %
Other: 8 %
Platelets: 193 10*3/uL (ref 150–400)
Promyelocytes Absolute: 0 %
RBC: 4 MIL/uL — ABNORMAL LOW (ref 4.22–5.81)
RDW: 15.6 % — ABNORMAL HIGH (ref 11.5–15.5)
WBC: 8.4 10*3/uL (ref 4.0–10.5)
nRBC: 0 /100 WBC

## 2016-11-26 LAB — URINALYSIS, COMPLETE (UACMP) WITH MICROSCOPIC
BACTERIA UA: NONE SEEN
Bilirubin Urine: NEGATIVE
GLUCOSE, UA: NEGATIVE mg/dL
Hgb urine dipstick: NEGATIVE
KETONES UR: NEGATIVE mg/dL
NITRITE: NEGATIVE
PH: 6 (ref 5.0–8.0)
Protein, ur: NEGATIVE mg/dL
RBC / HPF: NONE SEEN RBC/hpf (ref 0–5)
SPECIFIC GRAVITY, URINE: 1.006 (ref 1.005–1.030)

## 2016-11-26 LAB — COMPREHENSIVE METABOLIC PANEL
ALT: 33 U/L (ref 17–63)
AST: 66 U/L — ABNORMAL HIGH (ref 15–41)
Albumin: 4.2 g/dL (ref 3.5–5.0)
Alkaline Phosphatase: 103 U/L (ref 38–126)
Anion gap: 6 (ref 5–15)
BUN: 8 mg/dL (ref 6–20)
CO2: 24 mmol/L (ref 22–32)
Calcium: 8.7 mg/dL — ABNORMAL LOW (ref 8.9–10.3)
Chloride: 108 mmol/L (ref 101–111)
Creatinine, Ser: 0.91 mg/dL (ref 0.61–1.24)
GFR calc Af Amer: >60 mL/min (ref >60)
GFR calc non Af Amer: >60 mL/min (ref >60)
Glucose, Bld: 122 mg/dL — ABNORMAL HIGH (ref 65–99)
Potassium: 4.9 mmol/L (ref 3.5–5.1)
Sodium: 138 mmol/L (ref 135–145)
Total Bilirubin: 1.2 mg/dL (ref 0.3–1.2)
Total Protein: 7.1 g/dL (ref 6.5–8.1)

## 2016-11-26 LAB — RETICULOCYTES
RBC.: 4 MIL/uL — AB (ref 4.22–5.81)
RETIC COUNT ABSOLUTE: 268 10*3/uL — AB (ref 19.0–186.0)
RETIC CT PCT: 6.7 % — AB (ref 0.4–3.1)

## 2016-11-26 MED ORDER — HYDROMORPHONE HCL 1 MG/ML IJ SOLN
2.0000 mg | Freq: Once | INTRAMUSCULAR | Status: AC
  Administered 2016-11-26: 2 mg via INTRAVENOUS
  Filled 2016-11-26: qty 2

## 2016-11-26 MED ORDER — DEXTROSE-NACL 5-0.45 % IV SOLN
INTRAVENOUS | Status: DC
  Administered 2016-11-26: 10:00:00 via INTRAVENOUS

## 2016-11-26 MED ORDER — IBUPROFEN 600 MG PO TABS: 600.0000 mg | ORAL_TABLET | Freq: Three times a day (TID) | ORAL | 2 refills | Status: DC | PRN

## 2016-11-26 MED ORDER — OXYCODONE HCL 5 MG PO TABS
10.0000 mg | ORAL_TABLET | Freq: Once | ORAL | Status: AC
  Administered 2016-11-26: 10 mg via ORAL
  Filled 2016-11-26: qty 2

## 2016-11-26 MED ORDER — KETOROLAC TROMETHAMINE 30 MG/ML IJ SOLN
30.0000 mg | Freq: Once | INTRAMUSCULAR | Status: AC
  Administered 2016-11-26: 30 mg via INTRAVENOUS
  Filled 2016-11-26: qty 1

## 2016-11-26 MED ORDER — OXYCODONE HCL 5 MG PO TABS: 5.0000 mg | ORAL_TABLET | Freq: Four times a day (QID) | ORAL | 0 refills | Status: DC | PRN

## 2016-11-26 MED ORDER — HYDROMORPHONE HCL 1 MG/ML IJ SOLN
1.0000 mg | Freq: Once | INTRAMUSCULAR | Status: AC
  Administered 2016-11-26: 1 mg via INTRAVENOUS
  Filled 2016-11-26: qty 1

## 2016-11-26 MED ORDER — FOLIC ACID 1 MG PO TABS: 1.0000 mg | ORAL_TABLET | Freq: Every day | ORAL | 11 refills | Status: AC

## 2016-11-26 NOTE — Progress Notes (Signed)
Reviewed [place] Substance Reporting system prior to prescribing opiate medications. No inconsistencies noted.    Meds ordered this encounter  Medications  . oxyCODONE (OXY IR/ROXICODONE) 5 MG immediate release tablet    Sig: Take 1 tablet (5 mg total) by mouth every 6 (six) hours as needed for severe pain.    Dispense:  30 tablet    Refill:  0    Order Specific Question:   Supervising Provider    Answer:   [name], OLUGBEMIGA E [id number]]  . ibuprofen (ADVIL,MOTRIN) 600 MG tablet    Sig: Take 1 tablet (600 mg total) by mouth every 8 (eight) hours as needed.    Dispense:  30 tablet    Refill:  2  . folic acid (FOLVITE) 1 MG tablet    Sig: Take 1 tablet (1 mg total) by mouth daily.    Dispense:  30 tablet    Refill:  11    Jaspal Pultz Rennis PettyMoore Lansing Sigmon  MSN, FNP-C [facility] [address]  [place], [place] [id number] [phone]

## 2016-11-26 NOTE — Progress Notes (Signed)
Pt [facility] for [facility]. Pt's pain # was 9/10 on admission and down to 3/10 at discharge. Pt was treated IV push Dilaudid, Toradol, IV fluids, and rest. Pt received d/c instructions with verbal understanding. He also received prescriptions for his pain med and other meds. He was alert,oriented and ambulatory at discharge. He was taken home by a friend.

## 2016-11-26 NOTE — Telephone Encounter (Signed)
Patient called requesting treatment at the [facility] c/o left leg pain x 2 days. Patient rates pain a 9/10 on pain scale. Patient denies fever, chest pain, N/V/D or Priapism. Patient reports left sided abdominal pain and think it is related to working out. Patient last took his home medications Motrin 800 mg at 7:15 this morning and Hydrocodone around 1 am with no relief.  Patient placed on a brief hold and provider notified. [facility] per Julianne HandlerLachina Hollis FNP. Patient states and understanding.

## 2016-11-26 NOTE — Discharge Instructions (Addendum)
°  Resume all home medications. Increase water intake to 64 ounces per day. Recommend balanced diet. Increase rest, fluid intake and handwashing. Follow up in office as scheduled.   Sickle Cell Anemia, Adult Sickle cell anemia is a condition where your red blood cells are shaped like sickles. Red blood cells carry oxygen through the body. Sickle-shaped red blood cells do not live as long as normal red blood cells. They also clump together and block blood from flowing through the blood vessels. These things prevent the body from getting enough oxygen. Sickle cell anemia causes organ damage and pain. It also increases the risk of infection. Follow these instructions at home:  Drink enough fluid to keep your pee (urine) clear or pale yellow. Drink more in hot weather and during exercise.  Do not smoke. Smoking lowers oxygen levels in the blood.  Only take over-the-counter or prescription medicines as told by your doctor.  Take antibiotic medicines as told by your doctor. Make sure you finish them even if you start to feel better.  Take supplements as told by your doctor.  Consider wearing a medical alert bracelet. This tells anyone caring for you in an emergency of your condition.  When traveling, keep your medical information, doctors' names, and the medicines you take with you at all times.  If you have a fever, do not take fever medicines right away. This could cover up a problem. Tell your doctor.  Keep all follow-up visits with your doctor. Sickle cell anemia requires regular medical care. Contact a doctor if: You have a fever. Get help right away if:  You feel dizzy or faint.  You have new belly (abdominal) pain, especially on the left side near the stomach area.  You have a lasting, often uncomfortable and painful erection of the penis (priapism). If it is not treated right away, you will become unable to have sex (impotence).  You have numbness in your arms or legs or you have a  hard time moving them.  You have a hard time talking.  You have a fever or lasting symptoms for more than 2-3 days.  You have a fever and your symptoms suddenly get worse.  You have signs or symptoms of infection. These include: ? Chills. ? Being more tired than normal (lethargy). ? Irritability. ? Poor eating. ? Throwing up (vomiting).  You have pain that is not helped with medicine.  You have shortness of breath.  You have pain in your chest.  You are coughing up pus-like or bloody mucus.  You have a stiff neck.  Your feet or hands swell or have pain.  Your belly looks bloated.  Your joints hurt. This information is not intended to replace advice given to you by your health care provider. Make sure you discuss any questions you have with your health care provider. Document Released: 03/21/2013 Document Revised: 11/06/2015 Document Reviewed: 01/10/2013 Elsevier Interactive Patient Education  2017 [employer]

## 2016-11-26 NOTE — H&P (Signed)
[facility] History and Physical   Date: 11/26/2016  Patient name: William Jennings Medical record number: [id number] Date of birth: [birth year] Age: 26 y.o. Gender: male PCP: Massie MaroonHollis, Ranyah Groeneveld M, FNP  Attending physician: [name], Olugbemiga E, MD  Chief Complaint: Left leg pain  History of Present Illness: [name], a 26 year old male with a history of sickle cell anemia, HbSC presents complaining of left leg pain that is consistent with sickle cell anemia. He says that he has had left leg pain for 3 days. He attributes current pain crisis to starting a new exercise regimen. He describes pain as intermittent and throbbing. His pain intensity is 9/10. He has not been taking home medications as prescribed. He denies headache, shortness of breath, fatigue, fever, dysuria, nausea, vomiting, or diabetes.   Meds: Prescriptions Prior to Admission  Medication Sig Dispense Refill Last Dose  . folic acid (FOLVITE) 800 MCG tablet Take 1 tablet (800 mcg total) by mouth daily. 30 tablet 6 08/04/2016 at Unknown time  . HYDROcodone-acetaminophen (NORCO) 10-325 MG tablet Take 1 tablet by mouth every 6 (six) hours as needed. 60 tablet 0 08/05/2016 at 0500  . ibuprofen (ADVIL,MOTRIN) 600 MG tablet Take 1 tablet (600 mg total) by mouth every 8 (eight) hours as needed. 30 tablet 0 08/05/2016 at 0500    Allergies: Shellfish allergy Past Medical History:  Diagnosis Date  . Sickle cell disease (HCC)    Past Surgical History:  Procedure Laterality Date  . CHOLECYSTECTOMY     No family history on file. Social History   Social History  . Marital status: Single    Spouse name: N/A  . Number of children: N/A  . Years of education: N/A   Occupational History  . Not on file.   Social History Main Topics  . Smoking status: Former Games developermoker  . Smokeless tobacco: Never Used  . Alcohol use Yes  . Drug use: No  . Sexual activity: Yes   Other Topics Concern  . Not on file   Social  History Narrative  . No narrative on file    Review of Systems:  Review of Systems  Constitutional: Negative.  Negative for chills, malaise/fatigue and weight loss.  HENT: Negative.   Eyes: Negative.   Respiratory: Negative for cough and shortness of breath.   Cardiovascular: Negative.   Gastrointestinal: Negative.  Negative for heartburn, nausea and vomiting.  Genitourinary: Negative.  Negative for dysuria.  Musculoskeletal: Positive for myalgias (left leg pain).  Skin: Negative.   Neurological: Negative.   Endo/Heme/Allergies: Negative.   Psychiatric/Behavioral: Negative.     Physical Exam: Blood pressure 116/62, pulse 79, temperature 98.3 F (36.8 C), temperature source Oral, resp. rate 20, height 5\' 8"  (1.727 m), weight 150 lb (68 kg), SpO2 100 %. Physical Exam  Constitutional: He is oriented to person, place, and time. He appears well-developed and well-nourished.  HENT:  Head: Normocephalic and atraumatic.  Eyes: Pupils are equal, round, and reactive to light.  Neck: Normal range of motion.  Cardiovascular: Normal rate, regular rhythm, normal heart sounds and intact distal pulses.   Pulmonary/Chest: Effort normal and breath sounds normal.  Abdominal: Soft. Bowel sounds are normal.  Musculoskeletal: Normal range of motion. He exhibits tenderness (left leg).  Neurological: He is alert and oriented to person, place, and time.  Skin: Skin is warm and dry.    Lab results: No results found for this or any previous visit (from the past 24 hour(s)).  Imaging results:  No results found.   Assessment & Plan:  Patient will be admitted to the day infusion center for extended observation  Start IV D5.45 for cellular rehydration at 125/hr  Start Toradol 30 mg IV every 6 hours for inflammation.  Start Dilaudid IV per clinician assisted dose  Patient will be re-evaluated for pain intensity in the context of function and relationship to baseline as care progresses.  If  no significant pain relief, will transfer patient to inpatient services for a higher level of care.   Will check CMP, reticulocytes,  and CBC w/differential   Anahli Arvanitis M 11/26/2016, 9:09 AM

## 2016-11-27 NOTE — Discharge Summary (Signed)
[facility] Discharge Summary   Patient ID: William Jennings MRN: [id number] DOB/AGE: Jun 09, 1991 26 y.o.  Admit date: 11/26/2016 Discharge date: 11/27/2016  Primary Care Physician:  Massie MaroonHollis, Melquisedec Journey M, FNP  Admission Diagnoses:  Active Problems:   Sickle cell crisis [facility](HCC)   Discharge Medications:  Allergies as of 11/26/2016      Reactions   Shellfish Allergy Anaphylaxis      Medication List    TAKE these medications   folic acid 1 MG tablet Commonly known as:  FOLVITE Take 1 tablet (1 mg total) by mouth daily.   ibuprofen 600 MG tablet Commonly known as:  ADVIL,MOTRIN Take 1 tablet (600 mg total) by mouth every 8 (eight) hours as needed.   oxyCODONE 5 MG immediate release tablet Commonly known as:  Oxy IR/ROXICODONE Take 1 tablet (5 mg total) by mouth every 6 (six) hours as needed for severe pain.        Consults:  None  Significant Diagnostic Studies:  No results found.   [facility] Course: [name], a 26 year old male with a history of sickle cell anemia, HbSC was admitted to the day infusion center for extended observation.  Reviewed labs liver enzymes mildly elevated.  All other labs consistent with baseline.   Pain Management:  D5.45 @ 150 for cellular rehydration Toradol 30 mg IV for inflammation Dilaudid IV per clinician assisted doses. He used a total of 5 mg.  Oxycodone IR 10 mg times one Pain intensity decreased from 9/10 to 3/10. He says that he can manage at home on medication regimen.  William Jennings is alert, oriented, and ambulating He will discharge home in stable condition  Discharge instructions: Liver enzymes elevated, will discontinue Percocet. Prescribed Oxycodone 5 mg every 6 hours as needed for moderate to severe pain.    Resume all other medications Increase water intake to 64 ounces daily Recommend balanced diet Continue exercise regimen when crisis dissipates    Physical Exam at  Discharge:  BP 96/62   Pulse 70   Temp 98.3 F (36.8 C) (Oral)   Resp 20   Ht 5\' 8"  (1.727 m)   Wt 150 lb (68 kg)   SpO2 100%   BMI 22.81 kg/m  Physical Exam  Constitutional: He is oriented to person, place, and time.  Cardiovascular: Normal rate, regular rhythm, normal heart sounds and intact distal pulses.   Pulmonary/Chest: Effort normal and breath sounds normal.  Abdominal: Soft. Bowel sounds are normal.  Neurological: He is alert and oriented to person, place, and time. Gait normal.  Skin: Skin is warm and dry.      Disposition at Discharge: 01-Home or Self Care  Discharge Orders: Discharge Instructions    Discharge patient    Complete by:  As directed    Discharge disposition:  01-Home or Self Care   Discharge patient date:  11/26/2016      Condition at Discharge:   Stable  Time spent on Discharge:  Greater than 30 minutes.  Signed: Luise Yamamoto M 11/27/2016, 3:05 PM

## 2016-12-24 ENCOUNTER — Telehealth (HOSPITAL_COMMUNITY): Payer: Self-pay | Admitting: Hematology

## 2016-12-24 ENCOUNTER — Encounter (HOSPITAL_COMMUNITY): Payer: Self-pay | Admitting: Hematology

## 2016-12-24 ENCOUNTER — Non-Acute Institutional Stay (HOSPITAL_COMMUNITY)
Admission: AD | Admit: 2016-12-24 | Discharge: 2016-12-24 | Disposition: A | Discharge status: Home / Self Care | Payer: Medicaid Other | Source: Ambulatory Visit | Attending: Internal Medicine | Admitting: Internal Medicine

## 2016-12-24 DIAGNOSIS — D57219 Sickle-cell/Hb-C disease with crisis, unspecified: Secondary | ICD-10-CM | POA: Diagnosis not present

## 2016-12-24 DIAGNOSIS — D5700 Hb-SS disease with crisis, unspecified: Secondary | ICD-10-CM | POA: Insufficient documentation

## 2016-12-24 DIAGNOSIS — Z87891 Personal history of nicotine dependence: Secondary | ICD-10-CM | POA: Insufficient documentation

## 2016-12-24 LAB — CBC WITH DIFFERENTIAL/PLATELET
BASOS ABS: 0 10*3/uL (ref 0.0–0.1)
Basophils Relative: 0 %
EOS ABS: 0.1 10*3/uL (ref 0.0–0.7)
EOS PCT: 1 %
HCT: 35.2 % — ABNORMAL LOW (ref 39.0–52.0)
HEMOGLOBIN: 12.9 g/dL — AB (ref 13.0–17.0)
Lymphocytes Relative: 36 %
Lymphs Abs: 1.9 10*3/uL (ref 0.7–4.0)
MCH: 28 pg (ref 26.0–34.0)
MCHC: 36.6 g/dL — ABNORMAL HIGH (ref 30.0–36.0)
MCV: 76.5 fL — ABNORMAL LOW (ref 78.0–100.0)
Monocytes Absolute: 0.7 10*3/uL (ref 0.1–1.0)
Monocytes Relative: 14 %
NEUTROS PCT: 49 %
Neutro Abs: 2.6 10*3/uL (ref 1.7–7.7)
PLATELETS: 135 10*3/uL — AB (ref 150–400)
RBC: 4.6 MIL/uL (ref 4.22–5.81)
RDW: 15.6 % — ABNORMAL HIGH (ref 11.5–15.5)
WBC: 5.4 10*3/uL (ref 4.0–10.5)

## 2016-12-24 LAB — RETICULOCYTES
RBC.: 4.6 MIL/uL (ref 4.22–5.81)
Retic Count, Absolute: 174.8 10*3/uL (ref 19.0–186.0)
Retic Ct Pct: 3.8 % — ABNORMAL HIGH (ref 0.4–3.1)

## 2016-12-24 LAB — COMPREHENSIVE METABOLIC PANEL
ALBUMIN: 4.4 g/dL (ref 3.5–5.0)
ALT: 18 U/L (ref 17–63)
AST: 35 U/L (ref 15–41)
Alkaline Phosphatase: 98 U/L (ref 38–126)
Anion gap: 7 (ref 5–15)
CHLORIDE: 106 mmol/L (ref 101–111)
CO2: 27 mmol/L (ref 22–32)
CREATININE: 0.87 mg/dL (ref 0.61–1.24)
Calcium: 9 mg/dL (ref 8.9–10.3)
GFR calc Af Amer: >60 mL/min (ref >60)
GFR calc non Af Amer: >60 mL/min (ref >60)
GLUCOSE: 92 mg/dL (ref 65–99)
Potassium: 4.6 mmol/L (ref 3.5–5.1)
SODIUM: 140 mmol/L (ref 135–145)
Total Bilirubin: 1.2 mg/dL (ref 0.3–1.2)
Total Protein: 7.4 g/dL (ref 6.5–8.1)

## 2016-12-24 MED ORDER — KETOROLAC TROMETHAMINE 30 MG/ML IJ SOLN
30.0000 mg | Freq: Once | INTRAMUSCULAR | Status: AC
Start: 2016-12-24 — End: 2016-12-24
  Administered 2016-12-24: 30 mg via INTRAVENOUS
  Filled 2016-12-24: qty 1

## 2016-12-24 MED ORDER — OXYCODONE HCL 5 MG PO TABS
5.0000 mg | ORAL_TABLET | ORAL | Status: AC
  Administered 2016-12-24: 5 mg via ORAL
  Filled 2016-12-24: qty 1

## 2016-12-24 MED ORDER — DEXTROSE-NACL 5-0.45 % IV SOLN
INTRAVENOUS | Status: DC
  Administered 2016-12-24: 11:00:00 via INTRAVENOUS

## 2016-12-24 MED ORDER — HYDROMORPHONE HCL 4 MG/ML IJ SOLN
2.0000 mg | Freq: Once | INTRAMUSCULAR | Status: AC
  Administered 2016-12-24: 2 mg via INTRAVENOUS
  Filled 2016-12-24: qty 1

## 2016-12-24 MED ORDER — DIPHENHYDRAMINE HCL 25 MG PO CAPS
25.0000 mg | ORAL_CAPSULE | Freq: Once | ORAL | Status: AC
Start: 2016-12-24 — End: 2016-12-24
  Administered 2016-12-24: 25 mg via ORAL
  Filled 2016-12-24: qty 1

## 2016-12-24 NOTE — H&P (Signed)
[facility] History and Physical   Date: 12/24/2016  Patient name: William Jennings Medical record number: [id number] Date of birth: 10/25/1990 Age: 26 y.o. Gender: male PCP: Massie Maroon, FNP  Attending physician: Quentin Angst, MD  Chief Complaint: Left hand arm and leg pain   History of Present Illness:  William Jennings is a 26 y.o. male with a diagnosis of Sickle Cell Anemia, type Hb-C  presents today with a complaint of left upper and lower extremity pain. William Jennings reports awakening this morning with 9/10 pain. He attempted relief of pain with home pain medication, however only achieved minimal relief. Ed characterizes pain as aching and throbbing. Patient denies headache, fever, shortness of breath, chest pain, dysuria, nausea, vomiting, or diarrhea.  William Jennings is being admitted to the day infusion center for extended observation.  Meds: Prescriptions Prior to Admission  Medication Sig Dispense Refill Last Dose  . folic acid (FOLVITE) 1 MG tablet Take 1 tablet (1 mg total) by mouth daily. 30 tablet 11   . ibuprofen (ADVIL,MOTRIN) 600 MG tablet Take 1 tablet (600 mg total) by mouth every 8 (eight) hours as needed. 30 tablet 2   . oxyCODONE (OXY IR/ROXICODONE) 5 MG immediate release tablet Take 1 tablet (5 mg total) by mouth every 6 (six) hours as needed for severe pain. 30 tablet 0     Allergies: Shellfish allergy Past Medical History:  Diagnosis Date  . Sickle cell anemia (HCC)   . Sickle cell disease (HCC)    Past Surgical History:  Procedure Laterality Date  . CHOLECYSTECTOMY     No family history on file. Social History   Social History  . Marital status: Single    Spouse name: N/A  . Number of children: N/A  . Years of education: N/A   Occupational History  . Not on file.   Social History Main Topics  . Smoking status: Former Games developer  . Smokeless tobacco: Never Used  . Alcohol use Yes  . Drug use: No  . Sexual activity: Yes    Other Topics Concern  . Not on file   Social History Narrative  . No narrative on file   Review of Systems: Respiratory: negative Cardiovascular: negative Gastrointestinal: negative Musculoskeletal:negative for arthralgias Neurological: negative  Physical Exam: Vitals:   12/24/16 1029  BP: (!) 101/59  Pulse: 65  Resp: 18  Temp: 98.3 F (36.8 C)   General Appearance:    Alert, cooperative, no distress, appears stated age  Head:    Normocephalic, without obvious abnormality, atraumatic  Eyes:    PERRL, conjunctiva/corneas clear, EOM's intact  Back:     Symmetric, no curvature, ROM normal, no CVA tenderness  Lungs:     Clear to auscultation bilaterally, respirations unlabored   Heart:    Regular rate and rhythm, S1 and S2 normal, no murmur, rub   or gallop  Abdomen:     Soft, non-tender, bowel sounds active all four quadrants,    no masses, no organomegaly  Extremities:   Extremities normal, atraumatic, no cyanosis or edema  Neurologic:   Normal strength, sensation    Lab results: Pending  Imaging results:  N/A   Assessment & Plan:  Patient will be admitted to the day infusion center for extended observation  Start IV D5.45 for cellular rehydration at 125/hr  Start Toradol 30 mg IV every 6 hours for inflammation.  Start hydromorphone 2 mg once. Will evaluate the necessity for additional doses.  Patient will be  re-evaluated for pain intensity in the context of function and relationship to baseline as care progresses.  If no significant pain relief, will transfer patient to inpatient services for a higher level of care.   Will check CBC w/differential, CMP, and Reticulocyte    Joaquin CourtsKimberly Cassandre Oleksy 12/24/2016, 10:20 AM

## 2016-12-24 NOTE — Progress Notes (Signed)
Pt discharged to home; discharge instructions given and signed; pt alert, oriented, ambulatory; all questions answered; no complications noted

## 2016-12-24 NOTE — Discharge Instructions (Signed)
-  Continue to hydrate and take prescribed home medications as ordered. °-Resume all home medications. °-Keep upcoming appointment  °-The patient was given clear instructions to go to ER or return to medical center if symptoms do not improve, worsen or new problems develop. The patient verbalized understanding. ° ° °Sickle Cell Anemia, Adult °Sickle cell anemia is a condition where your red blood cells are shaped like sickles. Red blood cells carry oxygen through the body. Sickle-shaped red blood cells do not live as long as normal red blood cells. They also clump together and block blood from flowing through the blood vessels. These things prevent the body from getting enough oxygen. Sickle cell anemia causes organ damage and pain. It also increases the risk of infection. °Follow these instructions at home: °· Drink enough fluid to keep your pee (urine) clear or pale yellow. Drink more in hot weather and during exercise. °· Do not smoke. Smoking lowers oxygen levels in the blood. °· Only take over-the-counter or prescription medicines as told by your doctor. °· Take antibiotic medicines as told by your doctor. Make sure you finish them even if you start to feel better. °· Take supplements as told by your doctor. °· Consider wearing a medical alert bracelet. This tells anyone caring for you in an emergency of your condition. °· When traveling, keep your medical information, doctors' names, and the medicines you take with you at all times. °· If you have a fever, do not take fever medicines right away. This could cover up a problem. Tell your doctor. °· Keep all follow-up visits with your doctor. Sickle cell anemia requires regular medical care. °Contact a doctor if: °You have a fever. °Get help right away if: °· You feel dizzy or faint. °· You have new belly (abdominal) pain, especially on the left side near the stomach area. °· You have a lasting, often uncomfortable and painful erection of the penis (priapism). If  it is not treated right away, you will become unable to have sex (impotence). °· You have numbness in your arms or legs or you have a hard time moving them. °· You have a hard time talking. °· You have a fever or lasting symptoms for more than 2-3 days. °· You have a fever and your symptoms suddenly get worse. °· You have signs or symptoms of infection. These include: °¨ Chills. °¨ Being more tired than normal (lethargy). °¨ Irritability. °¨ Poor eating. °¨ Throwing up (vomiting). °· You have pain that is not helped with medicine. °· You have shortness of breath. °· You have pain in your chest. °· You are coughing up pus-like or bloody mucus. °· You have a stiff neck. °· Your feet or hands swell or have pain. °· Your belly looks bloated. °· Your joints hurt. °This information is not intended to replace advice given to you by your health care provider. Make sure you discuss any questions you have with your health care provider. °Document Released: 03/21/2013 Document Revised: 11/06/2015 Document Reviewed: 01/10/2013 °Elsevier Interactive Patient Education © 2017 Elsevier Inc. ° °

## 2016-12-24 NOTE — Telephone Encounter (Signed)
Called patient back and advised that he can come [facility].

## 2016-12-24 NOTE — Telephone Encounter (Signed)
Patient C/O pain to left leg and left arm.  Patient rates pain 8/10 on pain scale.  Patient denies chest pain, shortness of breath, N/V/D, fever, or abdominal pain.  Patient states he has taken hydrocodone and Ibuprofen with no improvements.  I advised I would speak with provider and give him a call back.  Patient verbalizes understanding.

## 2016-12-24 NOTE — Discharge Summary (Signed)
[facility] Discharge Summary   Patient ID: William Jennings MRN: [id number] DOB/AGE: 1991-04-30 26 y.o.  Admit date: 12/24/2016 Discharge date: 12/25/2016  Primary Care Physician:  Massie MaroonHollis, Lachina M, FNP  Admission Diagnoses:  Active Problems:   Sickle-cell/Hb-C disease with crisis (HCC)   Sickle cell crisis [facility]HCC)  Discharge Medications:  Allergies as of 12/24/2016      Reactions   Shellfish Allergy Anaphylaxis      Medication List    TAKE these medications   folic acid 1 MG tablet Commonly known as:  FOLVITE Take 1 tablet (1 mg total) by mouth daily.   ibuprofen 600 MG tablet Commonly known as:  ADVIL,MOTRIN Take 1 tablet (600 mg total) by mouth every 8 (eight) hours as needed.   oxyCODONE 5 MG immediate release tablet Commonly known as:  Oxy IR/ROXICODONE Take 1 tablet (5 mg total) by mouth every 6 (six) hours as needed for severe pain.    Consults:  Not applicable  Significant Diagnostic Studies:  Results for orders placed or performed during the hospital encounter of 12/24/16  CBC with Differential/Platelet  Result Value Ref Range   WBC 5.4 4.0 - 10.5 K/uL   RBC 4.60 4.22 - 5.81 MIL/uL   Hemoglobin 12.9 (L) 13.0 - 17.0 g/dL   HCT 91.435.2 (L) 78.239.0 - 95.652.0 %   MCV 76.5 (L) 78.0 - 100.0 fL   MCH 28.0 26.0 - 34.0 pg   MCHC 36.6 (H) 30.0 - 36.0 g/dL   RDW 21.315.6 (H) 08.611.5 - 57.815.5 %   Platelets 135 (L) 150 - 400 K/uL   Neutrophils Relative % 49 %   Neutro Abs 2.6 1.7 - 7.7 K/uL   Lymphocytes Relative 36 %   Lymphs Abs 1.9 0.7 - 4.0 K/uL   Monocytes Relative 14 %   Monocytes Absolute 0.7 0.1 - 1.0 K/uL   Eosinophils Relative 1 %   Eosinophils Absolute 0.1 0.0 - 0.7 K/uL   Basophils Relative 0 %   Basophils Absolute 0.0 0.0 - 0.1 K/uL  Comprehensive metabolic panel  Result Value Ref Range   Sodium 140 135 - 145 mmol/L   Potassium 4.6 3.5 - 5.1 mmol/L   Chloride 106 101 - 111 mmol/L   CO2 27 22 - 32 mmol/L   Glucose, Bld 92 65 - 99 mg/dL   BUN <5  (L) 6 - 20 mg/dL   Creatinine, Ser 4.690.87 0.61 - 1.24 mg/dL   Calcium 9.0 8.9 - 62.910.3 mg/dL   Total Protein 7.4 6.5 - 8.1 g/dL   Albumin 4.4 3.5 - 5.0 g/dL   AST 35 15 - 41 U/L   ALT 18 17 - 63 U/L   Alkaline Phosphatase 98 38 - 126 U/L   Total Bilirubin 1.2 0.3 - 1.2 mg/dL   GFR calc non Af Amer >60 >60 mL/min   GFR calc Af Amer >60 >60 mL/min   Anion gap 7 5 - 15  Reticulocytes  Result Value Ref Range   Retic Ct Pct 3.8 (H) 0.4 - 3.1 %   RBC. 4.60 4.22 - 5.81 MIL/uL   Retic Count, Absolute 174.8 19.0 - 186.0 K/uL   [facility] Course: William Jennings is a 26 y.o. male with a diagnosis of Sickle Cell Anemia, type Hb-C  presents today with a complaint of left upper and lower extremity pain. Duffy reports awakening this morning with 9/10 pain. He attempted relief of pain with home pain medication, however only achieved minimal relief.  Yvon characterizes pain as aching and throbbing. Patient denies headache, fever, shortness of breath, chest pain, dysuria, nausea, vomiting, or diarrhea.  Deray was admitted to the day infusion center for extended observation and the following course of treatment was administered: R604VW@ 167ml/hr for cellular rehydration , Toradol 30 mg IV for inflammation, Hydromorphone 6 mg IV (3 divided doses), Oxycodone 5 mg PO (home medication) x 1 dose. Pain was reduced from 8/10 to 6/10. Torrin is alert, ambulatory, and stable for discharge home for self care. Labs are consistent with patient's baseline.  Physical Exam at Discharge: BP (!) 101/59 (BP Location: Left Arm, Patient Position: Supine, Cuff Size: Normal)   Pulse 65   Temp 98.3 F (36.8 C) (Oral)   Resp 18   Ht 5\' 8"  (1.727 m)   Wt 150 lb (68 kg)   SpO2 100%   BMI 22.81 kg/m   General Appearance:    Alert, cooperative, no distress, appears stated age  Head:    Normocephalic, without obvious abnormality, atraumatic  Eyes:    PERRL, conjunctiva/corneas clear, EOM's intact  Back:      Symmetric, no curvature, ROM normal, no CVA tenderness  Lungs:     Clear to auscultation bilaterally, respirations unlabored   Heart:    Regular rate and rhythm, S1 and S2 normal, no murmur, rub   or gallop  Abdomen:     Soft, non-tender, bowel sounds active all four quadrants,    no masses, no organomegaly  Extremities:   Extremities normal, atraumatic, no cyanosis or edema  Neurologic:   Normal strength, sensation      Disposition at Discharge: 01-Home or Self Care  Discharge Orders: -Continue to hydrate and take prescribed home medications as ordered. -Resume all home medications. -Keep upcoming appointment  -The patient was given clear instructions to go to ER or return to medical center if symptoms do not improve, worsen or new problems develop. The patient verbalized understanding.  Condition at Discharge:   Stable  Time spent on Discharge:  Greater than 20 minutes.  Signed: Joaquin Courts 12/25/2016, 12:42 PM

## 2017-05-24 ENCOUNTER — Emergency Department (HOSPITAL_BASED_OUTPATIENT_CLINIC_OR_DEPARTMENT_OTHER)
Admission: EM | Admit: 2017-05-24 | Discharge: 2017-05-24 | Disposition: A | Discharge status: Home / Self Care | Payer: Medicaid Other | Attending: Emergency Medicine | Admitting: Emergency Medicine

## 2017-05-24 ENCOUNTER — Encounter (HOSPITAL_BASED_OUTPATIENT_CLINIC_OR_DEPARTMENT_OTHER): Payer: Self-pay | Admitting: Emergency Medicine

## 2017-05-24 ENCOUNTER — Other Ambulatory Visit: Payer: Self-pay

## 2017-05-24 ENCOUNTER — Emergency Department (HOSPITAL_BASED_OUTPATIENT_CLINIC_OR_DEPARTMENT_OTHER): Payer: Medicaid Other

## 2017-05-24 DIAGNOSIS — R0602 Shortness of breath: Secondary | ICD-10-CM | POA: Insufficient documentation

## 2017-05-24 DIAGNOSIS — Z79899 Other long term (current) drug therapy: Secondary | ICD-10-CM | POA: Insufficient documentation

## 2017-05-24 DIAGNOSIS — Z87891 Personal history of nicotine dependence: Secondary | ICD-10-CM | POA: Insufficient documentation

## 2017-05-24 DIAGNOSIS — D5700 Hb-SS disease with crisis, unspecified: Secondary | ICD-10-CM | POA: Diagnosis not present

## 2017-05-24 LAB — CBC WITH DIFFERENTIAL/PLATELET
BASOS ABS: 0 10*3/uL (ref 0.0–0.1)
BLASTS: 0 %
Band Neutrophils: 0 %
Basophils Relative: 0 %
Eosinophils Absolute: 0.2 10*3/uL (ref 0.0–0.7)
Eosinophils Relative: 2 %
HEMATOCRIT: 32.5 % — AB (ref 39.0–52.0)
HEMOGLOBIN: 12.1 g/dL — AB (ref 13.0–17.0)
Lymphocytes Relative: 26 %
Lymphs Abs: 2.3 10*3/uL (ref 0.7–4.0)
MCH: 28.8 pg (ref 26.0–34.0)
MCHC: 37.2 g/dL — ABNORMAL HIGH (ref 30.0–36.0)
MCV: 77.4 fL — ABNORMAL LOW (ref 78.0–100.0)
MONOS PCT: 6 %
MYELOCYTES: 0 %
Metamyelocytes Relative: 2 %
Monocytes Absolute: 0.5 10*3/uL (ref 0.1–1.0)
Neutro Abs: 5.7 10*3/uL (ref 1.7–7.7)
Neutrophils Relative %: 64 %
Other: 0 %
Platelets: 147 10*3/uL — ABNORMAL LOW (ref 150–400)
Promyelocytes Absolute: 0 %
RBC: 4.2 MIL/uL — AB (ref 4.22–5.81)
RDW: 14.1 % (ref 11.5–15.5)
WBC: 8.7 10*3/uL (ref 4.0–10.5)
nRBC: 5 /100 WBC — ABNORMAL HIGH

## 2017-05-24 LAB — RETICULOCYTES
RBC.: 4.21 MIL/uL — ABNORMAL LOW (ref 4.22–5.81)
Retic Count, Absolute: 206.3 10*3/uL — ABNORMAL HIGH (ref 19.0–186.0)
Retic Ct Pct: 4.9 % — ABNORMAL HIGH (ref 0.4–3.1)

## 2017-05-24 LAB — URINALYSIS, ROUTINE W REFLEX MICROSCOPIC
Bilirubin Urine: NEGATIVE
Glucose, UA: NEGATIVE mg/dL
HGB URINE DIPSTICK: NEGATIVE
Ketones, ur: NEGATIVE mg/dL
Leukocytes, UA: NEGATIVE
Nitrite: NEGATIVE
Protein, ur: NEGATIVE mg/dL
SPECIFIC GRAVITY, URINE: 1.01 (ref 1.005–1.030)
pH: 6 (ref 5.0–8.0)

## 2017-05-24 LAB — COMPREHENSIVE METABOLIC PANEL
ALBUMIN: 4.4 g/dL (ref 3.5–5.0)
ALK PHOS: 102 U/L (ref 38–126)
ALT: 17 U/L (ref 17–63)
AST: 35 U/L (ref 15–41)
Anion gap: 5 (ref 5–15)
BILIRUBIN TOTAL: 1.6 mg/dL — AB (ref 0.3–1.2)
BUN: 13 mg/dL (ref 6–20)
CALCIUM: 8.8 mg/dL — AB (ref 8.9–10.3)
CO2: 23 mmol/L (ref 22–32)
Chloride: 108 mmol/L (ref 101–111)
Creatinine, Ser: 0.87 mg/dL (ref 0.61–1.24)
GFR calc Af Amer: >60 mL/min (ref >60)
GFR calc non Af Amer: >60 mL/min (ref >60)
GLUCOSE: 94 mg/dL (ref 65–99)
Potassium: 4.2 mmol/L (ref 3.5–5.1)
Sodium: 136 mmol/L (ref 135–145)
TOTAL PROTEIN: 7.5 g/dL (ref 6.5–8.1)

## 2017-05-24 MED ORDER — KETOROLAC TROMETHAMINE 30 MG/ML IJ SOLN
30.0000 mg | INTRAMUSCULAR | Status: AC
  Administered 2017-05-24: 30 mg via INTRAVENOUS
  Filled 2017-05-24: qty 1

## 2017-05-24 MED ORDER — MORPHINE SULFATE (PF) 4 MG/ML IV SOLN
4.0000 mg | INTRAVENOUS | Status: AC
  Administered 2017-05-24 (×3): 4 mg via INTRAVENOUS
  Filled 2017-05-24 (×3): qty 1

## 2017-05-24 MED ORDER — ONDANSETRON HCL 4 MG/2ML IJ SOLN
4.0000 mg | INTRAMUSCULAR | Status: DC | PRN
  Administered 2017-05-24: 4 mg via INTRAVENOUS
  Filled 2017-05-24: qty 2

## 2017-05-24 MED ORDER — OXYCODONE-ACETAMINOPHEN 5-325 MG PO TABS: 2.0000 | ORAL_TABLET | Freq: Four times a day (QID) | ORAL | 0 refills | Status: DC | PRN

## 2017-05-24 MED ORDER — SODIUM CHLORIDE 0.9 % IV BOLUS (SEPSIS)
1000.0000 mL | Freq: Once | INTRAVENOUS | Status: AC
  Administered 2017-05-24: 1000 mL via INTRAVENOUS

## 2017-05-24 NOTE — ED Triage Notes (Signed)
Pt having crisis pain from sickle cell.  Pt states he went out and played with his children in the snow yesterday and then it hit.  Tried to control at home but it continues.

## 2017-05-24 NOTE — ED Provider Notes (Signed)
TIME SEEN: 10:21 AM  CHIEF COMPLAINT: Sickle cell pain  HPI: Patient is a 26 year old male with history of sickle cell anemia who only takes ibuprofen at home who presents emergency department with pain in both upper legs and hips that is typical of his sickle cell pain that started yesterday after being out in the snow with his children.  He states that he has had some shortness of breath but no fevers, chest pain or cough.  He thinks he has had acute chest syndrome before as a child.  No history of MI, PE, DVT, stroke.  He is not sure of his normal hemoglobin.  States that he does not normally have to come to the hospital for his sickle cell pain.  States that Toradol, morphine normally help with his pain.  ROS: See HPI Constitutional: no fever  Eyes: no drainage  ENT: no runny nose   Cardiovascular:  no chest pain  Resp:  SOB  GI: no vomiting or diarrhea GU: no dysuria Integumentary: no rash  Allergy: no hives  Musculoskeletal: no leg swelling  Neurological: no slurred speech ROS otherwise negative  PAST MEDICAL HISTORY/PAST SURGICAL HISTORY:  Past Medical History:  Diagnosis Date  . Sickle cell anemia (HCC)   . Sickle cell disease (HCC)     MEDICATIONS:  Prior to Admission medications   Medication Sig Start Date End Date Taking? Authorizing Provider  ibuprofen (ADVIL,MOTRIN) 600 MG tablet Take 1 tablet (600 mg total) by mouth every 8 (eight) hours as needed. 11/26/16  Yes Massie MaroonHollis, Lachina M, FNP  folic acid (FOLVITE) 1 MG tablet Take 1 tablet (1 mg total) by mouth daily. 11/26/16 11/26/17  Massie MaroonHollis, Lachina M, FNP  oxyCODONE (OXY IR/ROXICODONE) 5 MG immediate release tablet Take 1 tablet (5 mg total) by mouth every 6 (six) hours as needed for severe pain. 11/26/16   Massie MaroonHollis, Lachina M, FNP    ALLERGIES:  Allergies  Allergen Reactions  . Shellfish Allergy Anaphylaxis    SOCIAL HISTORY:  Social History   Tobacco Use  . Smoking status: Former Games developermoker  . Smokeless tobacco: Never  Used  Substance Use Topics  . Alcohol use: Yes    FAMILY HISTORY: No family history on file.  EXAM: BP 113/65   Pulse 90   Temp 98.4 F (36.9 C)   Resp 16   Ht 5\' 8"  (1.727 m)   Wt 65.8 kg (145 lb)   SpO2 100%   BMI 22.05 kg/m  CONSTITUTIONAL: Alert and oriented and responds appropriately to questions. Well-appearing; well-nourished HEAD: Normocephalic EYES: Conjunctivae clear, pupils appear equal, EOMI ENT: normal nose; moist mucous membranes NECK: Supple, no meningismus, no nuchal rigidity, no LAD  CARD: RRR; S1 and S2 appreciated; no murmurs, no clicks, no rubs, no gallops RESP: Normal chest excursion without splinting or tachypnea; breath sounds clear and equal bilaterally; no wheezes, no rhonchi, no rales, no hypoxia or respiratory distress, speaking full sentences ABD/GI: Normal bowel sounds; non-distended; soft, non-tender, no rebound, no guarding, no peritoneal signs, no hepatosplenomegaly BACK:  The back appears normal and is non-tender to palpation, there is no CVA tenderness EXT: Normal ROM in all joints; non-tender to palpation; no edema; normal capillary refill; no cyanosis, no calf tenderness or swelling, compartments soft, no joint effusion, 2+ radial and DP pulses bilaterally    SKIN: Normal color for age and race; warm; no rash NEURO: Moves all extremities equally, normal sensation diffusely, normal gait PSYCH: The patient's mood and manner are appropriate. Grooming and personal  hygiene are appropriate.  MEDICAL DECISION MAKING: Patient here with sickle cell pain crisis.  Typical of his previous episodes.  He thinks that being in the snow with his children yesterday triggered his pain.  Will give IV fluids, Toradol, 3 rounds of morphine and Zofran.  Will check labs, urine and chest x-ray.  EKG shows no ischemic abnormality.  ED PROGRESS: Patient's urine shows no sign of infection and no dehydration.  Hemoglobin is 12.1.  He does have spherocytes and sickle cell  seen on his CBC.  Total bilirubin slightly elevated.  Elevated reticulocyte count.  Chest x-ray clear.  EKG shows no ischemic changes.  Patient reports feeling much better after 3 rounds of morphine 4 mg IV.  Will discharge with brief course of Percocet for pain control at home.  Discussed return precautions.  Patient is comfortable with plan for discharge.   At this time, I do not feel there is any life-threatening condition present. I have reviewed and discussed all results (EKG, imaging, lab, urine as appropriate) and exam findings with patient/family. I have reviewed nursing notes and appropriate previous records.  I feel the patient is safe to be discharged home without further emergent workup and can continue workup as an outpatient as needed. Discussed usual and customary return precautions. Patient/family verbalize understanding and are comfortable with this plan.  Outpatient follow-up has been provided if needed. All questions have been answered.      EKG Interpretation  Date/Time:  Tuesday May 24 2017 11:05:27 EST Ventricular Rate:  83 PR Interval:    QRS Duration: 84 QT Interval:  378 QTC Calculation: 445 R Axis:   83 Text Interpretation:  Sinus rhythm No significant change since last tracing Confirmed by Ward, Baxter HireKristen [phone] on 05/24/2017 11:09:14 AM         Ward, Layla MawKristen N, DO 05/24/17 1322

## 2017-05-25 ENCOUNTER — Ambulatory Visit (INDEPENDENT_AMBULATORY_CARE_PROVIDER_SITE_OTHER): Payer: Medicaid Other | Admitting: Family Medicine

## 2017-05-25 ENCOUNTER — Encounter: Payer: Self-pay | Admitting: Family Medicine

## 2017-05-25 VITALS — BP 120/68 | HR 98 | Temp 98.7°F | Resp 16 | Ht 68.0 in | Wt 149.0 lb

## 2017-05-25 DIAGNOSIS — D5700 Hb-SS disease with crisis, unspecified: Secondary | ICD-10-CM

## 2017-05-25 DIAGNOSIS — Z79891 Long term (current) use of opiate analgesic: Secondary | ICD-10-CM

## 2017-05-25 MED ORDER — OXYCODONE-ACETAMINOPHEN 5-325 MG PO TABS: 1.0000 | ORAL_TABLET | Freq: Four times a day (QID) | ORAL | 0 refills | Status: DC | PRN

## 2017-05-25 NOTE — Patient Instructions (Addendum)
  You are to return to clinic on tomorrow morning for sickle cell crisis.  Discussed the importance of drinking 64 ounces of water daily. The Importance of Water. To help prevent pain crises, it is important to drink plenty of water throughout the day. This is because dehydration of red blood cells may lead to the sickling process.     Sickle Cell Anemia, Adult Sickle cell anemia is a condition where your red blood cells are shaped like sickles. Red blood cells carry oxygen through the body. Sickle-shaped red blood cells do not live as long as normal red blood cells. They also clump together and block blood from flowing through the blood vessels. These things prevent the body from getting enough oxygen. Sickle cell anemia causes organ damage and pain. It also increases the risk of infection. Follow these instructions at home:  Drink enough fluid to keep your pee (urine) clear or pale yellow. Drink more in hot weather and during exercise.  Do not smoke. Smoking lowers oxygen levels in the blood.  Only take over-the-counter or prescription medicines as told by your doctor.  Take antibiotic medicines as told by your doctor. Make sure you finish them even if you start to feel better.  Take supplements as told by your doctor.  Consider wearing a medical alert bracelet. This tells anyone caring for you in an emergency of your condition.  When traveling, keep your medical information, doctors' names, and the medicines you take with you at all times.  If you have a fever, do not take fever medicines right away. This could cover up a problem. Tell your doctor.  Keep all follow-up visits with your doctor. Sickle cell anemia requires regular medical care. Contact a doctor if: You have a fever. Get help right away if:  You feel dizzy or faint.  You have new belly (abdominal) pain, especially on the left side near the stomach area.  You have a lasting, often uncomfortable and painful erection of  the penis (priapism). If it is not treated right away, you will become unable to have sex (impotence).  You have numbness in your arms or legs or you have a hard time moving them.  You have a hard time talking.  You have a fever or lasting symptoms for more than 2-3 days.  You have a fever and your symptoms suddenly get worse.  You have signs or symptoms of infection. These include: ? Chills. ? Being more tired than normal (lethargy). ? Irritability. ? Poor eating. ? Throwing up (vomiting).  You have pain that is not helped with medicine.  You have shortness of breath.  You have pain in your chest.  You are coughing up pus-like or bloody mucus.  You have a stiff neck.  Your feet or hands swell or have pain.  Your belly looks bloated.  Your joints hurt. This information is not intended to replace advice given to you by your health care provider. Make sure you discuss any questions you have with your health care provider. Document Released: 03/21/2013 Document Revised: 11/06/2015 Document Reviewed: 01/10/2013 Elsevier Interactive Patient Education  2017 [employer]

## 2017-05-25 NOTE — Progress Notes (Signed)
Subjective:    Patient ID: William Jennings, male    DOB: 08/07/1990, 26 y.o.   MRN: [id number]  HPI [name], a 26 year old male with a history of sickle cell anemia, HbSC. Patient is having 8/10 generalized pain. Pain has been increased over the past 3 days. He attributes current pain crisis to changes in weather. Patient has been evaluated in the emergency department on 05/24/2017. He has been managing pain at home with OTC Ibuprofen.  He endorses headaches. Denies fatigue, shortness of breath, abdominal pain, dysuria, nausea, vomiting, diarrhea, or constipation.   Past Medical History:  Diagnosis Date  . Sickle cell anemia (HCC)   . Sickle cell disease (HCC)    Social History   Socioeconomic History  . Marital status: Single    Spouse name: Not on file  . Number of children: Not on file  . Years of education: Not on file  . Highest education level: Not on file  Social Needs  . Financial resource strain: Not on file  . Food insecurity - worry: Not on file  . Food insecurity - inability: Not on file  . Transportation needs - medical: Not on file  . Transportation needs - non-medical: Not on file  Occupational History  . Not on file  Tobacco Use  . Smoking status: Former Games developermoker  . Smokeless tobacco: Never Used  Substance and Sexual Activity  . Alcohol use: Yes  . Drug use: No  . Sexual activity: Yes  Other Topics Concern  . Not on file  Social History Narrative  . Not on file   Immunization History  Administered Date(s) Administered  . Tdap 08/05/2016   Review of Systems  Constitutional: Negative for fatigue.  HENT: Negative.   Eyes: Negative.   Respiratory: Negative.   Cardiovascular: Negative for palpitations and leg swelling.  Gastrointestinal: Negative.   Endocrine: Negative.  Negative for polydipsia, polyphagia and polyuria.  Genitourinary: Negative.   Musculoskeletal: Positive for back pain and myalgias.  Allergic/Immunologic: Negative.    Neurological: Positive for weakness. Negative for dizziness, syncope, facial asymmetry, light-headedness and numbness.  Psychiatric/Behavioral: Negative.        Objective:   Physical Exam  Constitutional: He is oriented to person, place, and time. He appears well-developed and well-nourished. He appears ill.  HENT:  Head: Normocephalic and atraumatic.  Right Ear: Hearing and external ear normal.  Mouth/Throat: Oropharynx is clear and moist.  Eyes: Conjunctivae and EOM are normal. Pupils are equal, round, and reactive to light.  Neck: Normal range of motion. Neck supple.  Cardiovascular: Normal rate, regular rhythm, normal heart sounds and intact distal pulses.  Pulmonary/Chest: Effort normal and breath sounds normal.  Abdominal: Soft. Bowel sounds are normal.  Musculoskeletal:       Lumbar back: He exhibits decreased range of motion.  Neurological: He is alert and oriented to person, place, and time. He has normal reflexes. No cranial nerve deficit. Coordination normal.  Skin: Skin is warm and dry.  Psychiatric: He has a normal mood and affect. His behavior is normal. Judgment and thought content normal.      BP 120/68 (BP Location: Right Arm, Patient Position: Sitting, Cuff Size: Normal)   Pulse 98   Temp 98.7 F (37.1 C) (Oral)   Resp 16   Ht 5\' 8"  (1.727 m)   Wt 149 lb (67.6 kg)   SpO2 100%   BMI 22.66 kg/m  Assessment & Plan:   Hb-SS disease with crisis (HCC) Sickle  cell disease - Will continue folic acid 1 mg daily to prevent aplastic bone marrow crises.   Pulmonary evaluation - Patient denies severe recurrent wheezes, shortness of breath with exercise, or persistent cough. If these symptoms develop, pulmonary function tests with spirometry will be ordered, and if abnormal, plan on referral to Pulmonology for further evaluation.  Cardiac - Routine screening for pulmonary hypertension is not recommended.  Eye - High risk of proliferative retinopathy. Annual eye exam  with retinal exam recommended to patient.  Immunization status - Patient refuses vaccinations on today.   Acute and chronic painful episodes - We agreed on 60 pills per month. Pt states that he primarily utilizes Ibuprofen for mild pain related to sickle cell anemia. We discussed that pt is to receive her Schedule II prescriptions only from us. Pt is also aware that the prescription history is available to us online through the [facility]. Controlled substance agreement signed previously.  We reminded William Jennings that all patients receiving Schedule II narcotics must be seen for follow within one month of prescription being requested. We reviewed the terms of our pain agreement, including the need to keep medicines in a safe locked location away from children or pets, and the need to report excess sedation or constipation, measures to avoid constipation, and policies related to early refills and stolen prescriptions. According to the [place] [facility] program, we have reviewed details related to analgesia, adverse effects, aberrant behaviors. Reviewed [place] Substance Reporting system prior to prescribing opiate medication, no inconsistencies noted.     - oxyCODONE-acetaminophen (PERCOCET/ROXICET) 5-325 MG tablet; Take 1 tablet by mouth every 6 (six) hours as needed.  Dispense: 60 tablet; Refill: 0  Chronic prescription opiate use - ToxASSURE Select 13 (MW), Urine   RTC: Patient to follow up at day infusion center on 05/25/2017. Patient is to follow up in 2 months   Nolon NationsLachina Moore Hollis  MSN, FNP-C Patient Care [facility] [address]  [place], [place] [id number] [phone]

## 2017-05-26 ENCOUNTER — Encounter (HOSPITAL_COMMUNITY): Payer: Self-pay | Admitting: *Deleted

## 2017-05-26 ENCOUNTER — Non-Acute Institutional Stay (HOSPITAL_COMMUNITY)
Admission: AD | Admit: 2017-05-26 | Discharge: 2017-05-26 | Disposition: A | Discharge status: Home / Self Care | Payer: Medicaid Other | Source: Ambulatory Visit | Attending: Internal Medicine | Admitting: Internal Medicine

## 2017-05-26 DIAGNOSIS — D57219 Sickle-cell/Hb-C disease with crisis, unspecified: Secondary | ICD-10-CM | POA: Diagnosis present

## 2017-05-26 DIAGNOSIS — Z91013 Allergy to seafood: Secondary | ICD-10-CM | POA: Insufficient documentation

## 2017-05-26 LAB — COMPREHENSIVE METABOLIC PANEL
ALBUMIN: 4.3 g/dL (ref 3.5–5.0)
ALK PHOS: 99 U/L (ref 38–126)
ALT: 25 U/L (ref 17–63)
ANION GAP: 7 (ref 5–15)
AST: 41 U/L (ref 15–41)
BILIRUBIN TOTAL: 1.9 mg/dL — AB (ref 0.3–1.2)
BUN: 9 mg/dL (ref 6–20)
CALCIUM: 9.2 mg/dL (ref 8.9–10.3)
CO2: 24 mmol/L (ref 22–32)
Chloride: 107 mmol/L (ref 101–111)
Creatinine, Ser: 0.71 mg/dL (ref 0.61–1.24)
Glucose, Bld: 93 mg/dL (ref 65–99)
POTASSIUM: 4.5 mmol/L (ref 3.5–5.1)
Sodium: 138 mmol/L (ref 135–145)
TOTAL PROTEIN: 7.8 g/dL (ref 6.5–8.1)

## 2017-05-26 LAB — CBC
HEMATOCRIT: 31.8 % — AB (ref 39.0–52.0)
HEMOGLOBIN: 11.5 g/dL — AB (ref 13.0–17.0)
MCH: 28.6 pg (ref 26.0–34.0)
MCHC: 36.2 g/dL — AB (ref 30.0–36.0)
MCV: 79.1 fL (ref 78.0–100.0)
Platelets: 133 10*3/uL — ABNORMAL LOW (ref 150–400)
RBC: 4.02 MIL/uL — ABNORMAL LOW (ref 4.22–5.81)
RDW: 14.6 % (ref 11.5–15.5)
WBC: 5.3 10*3/uL (ref 4.0–10.5)

## 2017-05-26 MED ORDER — HYDROMORPHONE 1 MG/ML IV SOLN
INTRAVENOUS | Status: DC
  Administered 2017-05-26: 30 mg via INTRAVENOUS
  Administered 2017-05-26: 4.5 mg via INTRAVENOUS
  Filled 2017-05-26: qty 30

## 2017-05-26 MED ORDER — DEXTROSE-NACL 5-0.45 % IV SOLN
INTRAVENOUS | Status: DC
  Administered 2017-05-26: 11:00:00 via INTRAVENOUS

## 2017-05-26 MED ORDER — SODIUM CHLORIDE 0.9% FLUSH: 9.0000 mL | INTRAVENOUS | Status: DC | PRN

## 2017-05-26 MED ORDER — OXYCODONE HCL 5 MG PO TABS
5.0000 mg | ORAL_TABLET | Freq: Once | ORAL | Status: AC
  Administered 2017-05-26: 5 mg via ORAL
  Filled 2017-05-26: qty 1

## 2017-05-26 MED ORDER — DIPHENHYDRAMINE HCL 12.5 MG/5ML PO ELIX: 12.5000 mg | ORAL_SOLUTION | Freq: Four times a day (QID) | ORAL | Status: DC | PRN

## 2017-05-26 MED ORDER — DIPHENHYDRAMINE HCL 50 MG/ML IJ SOLN: 12.5000 mg | Freq: Four times a day (QID) | INTRAMUSCULAR | Status: DC | PRN

## 2017-05-26 MED ORDER — ONDANSETRON HCL 4 MG/2ML IJ SOLN: 4.0000 mg | Freq: Four times a day (QID) | INTRAMUSCULAR | Status: DC | PRN

## 2017-05-26 MED ORDER — NALOXONE HCL 0.4 MG/ML IJ SOLN: 0.4000 mg | INTRAMUSCULAR | Status: DC | PRN

## 2017-05-26 MED ORDER — KETOROLAC TROMETHAMINE 30 MG/ML IJ SOLN
15.0000 mg | Freq: Once | INTRAMUSCULAR | Status: AC
  Administered 2017-05-26: 15 mg via INTRAVENOUS
  Filled 2017-05-26: qty 1

## 2017-05-26 NOTE — Progress Notes (Signed)
Patient admitted to day hospital due to pain in lower back and hips rated 7/10. Patient placed on Full-Dose Dilaudid PCA, hydrated with IV fluids, given IV Toradol and PO Oxycodone 5 mg to treat pain. At discharge patient's pain down to 3/10. Discharge instructions given to patient. Patient reminded to follow up with primary doctor in 1 month. Patient expresses an understanding. Patient alert, oriented and ambulatory at discharge.

## 2017-05-26 NOTE — Discharge Instructions (Signed)
Sickle Cell Anemia, Adult °Sickle cell anemia is a condition in which red blood cells have an abnormal “sickle” shape. This abnormal shape shortens the cells’ life span, which results in a lower than normal concentration of red blood cells in the blood. The sickle shape also causes the cells to clump together and block free blood flow through the blood vessels. As a result, the tissues and organs of the body do not receive enough oxygen. Sickle cell anemia causes organ damage and pain and increases the risk of infection. °What are the causes? °Sickle cell anemia is a genetic disorder. Those who receive two copies of the gene have the condition, and those who receive one copy have the trait. °What increases the risk? °The sickle cell gene is most common in people whose families originated in [place]. Other areas of the globe where sickle cell trait occurs include the Mediterranean, [place] and [place], the [place], and the [place]. °What are the signs or symptoms? °· Pain, especially in the extremities, back, chest, or abdomen (common). The pain may start suddenly or may develop following an illness, especially if there is dehydration. Pain can also occur due to overexertion or exposure to extreme temperature changes. °· Frequent severe bacterial infections, especially certain types of pneumonia and meningitis. °· Pain and swelling in the hands and feet. °· Decreased activity. °· Loss of appetite. °· Change in behavior. °· Headaches. °· Seizures. °· Shortness of breath or difficulty breathing. °· Vision changes. °· Skin ulcers. °Those with the trait may not have symptoms or they may have mild symptoms. °How is this diagnosed? °Sickle cell anemia is diagnosed with blood tests that demonstrate the genetic trait. It is often diagnosed during the newborn period, due to mandatory testing nationwide. A variety of blood tests, X-rays, CT scans, MRI scans, ultrasounds, and lung function tests may also be done to  monitor the condition. °How is this treated? °Sickle cell anemia may be treated with: °· Medicines. You may be given pain medicines, antibiotic medicines (to treat and prevent infections) or medicines to increase the production of certain types of hemoglobin. °· Fluids. °· Oxygen. °· Blood transfusions. ° °Follow these instructions at home: °· Drink enough fluid to keep your urine clear or pale yellow. Increase your fluid intake in hot weather and during exercise. °· Do not smoke. Smoking lowers oxygen levels in the blood. °· Only take over-the-counter or prescription medicines for pain, fever, or discomfort as directed by your health care provider. °· Take antibiotics as directed by your health care provider. Make sure you finish them it even if you start to feel better. °· Take supplements as directed by your health care provider. °· Consider wearing a medical alert bracelet. This tells anyone caring for you in an emergency of your condition. °· When traveling, keep your medical information, health care provider's names, and the medicines you take with you at all times. °· If you develop a fever, do not take medicines to reduce the fever right away. This could cover up a problem that is developing. Notify your health care provider. °· Keep all follow-up appointments with your health care provider. Sickle cell anemia requires regular medical care. °Contact a health care provider if: °You have a fever. °Get help right away if: °· You feel dizzy or faint. °· You have new abdominal pain, especially on the left side near the stomach area. °· You develop a persistent, often uncomfortable and painful penile erection (priapism). If this is not   treated immediately it will lead to impotence. °· You have numbness your arms or legs or you have a hard time moving them. °· You have a hard time with speech. °· You have a fever or persistent symptoms for more than 2-3 days. °· You have a fever and your symptoms suddenly get  worse. °· You have signs or symptoms of infection. These include: °? Chills. °? Abnormal tiredness (lethargy). °? Irritability. °? Poor eating. °? Vomiting. °· You develop pain that is not helped with medicine. °· You develop shortness of breath. °· You have pain in your chest. °· You are coughing up pus-like or bloody sputum. °· You develop a stiff neck. °· Your feet or hands swell or have pain. °· Your abdomen appears bloated. °· You develop joint pain. °This information is not intended to replace advice given to you by your health care provider. Make sure you discuss any questions you have with your health care provider. °Document Released: 09/08/2005 Document Revised: 12/19/2015 Document Reviewed: 01/10/2013 °Elsevier Interactive Patient Education © 2017 Elsevier Inc. ° °

## 2017-05-26 NOTE — Discharge Summary (Signed)
[facility] Discharge Summary   Patient ID: William Jennings MRN: [id number] DOB/AGE: Oct 05, 1990 26 y.o.  Admit date: 05/26/2017 Discharge date: 05/26/2017  Primary Care Physician:  Massie MaroonHollis, Rheanna Sergent M, FNP  Admission Diagnoses:  Active Problems:   Sickle-cell/Hb-C disease with crisis [facility](HCC)  Discharge Medications:  Allergies as of 05/26/2017      Reactions   Shellfish Allergy Anaphylaxis      Medication List    TAKE these medications   folic acid 1 MG tablet Commonly known as:  FOLVITE Take 1 tablet (1 mg total) by mouth daily.   ibuprofen 600 MG tablet Commonly known as:  ADVIL,MOTRIN Take 1 tablet (600 mg total) by mouth every 8 (eight) hours as needed.   oxyCODONE 5 MG immediate release tablet Commonly known as:  Oxy IR/ROXICODONE Take 1 tablet (5 mg total) by mouth every 6 (six) hours as needed for severe pain.   oxyCODONE-acetaminophen 5-325 MG tablet Commonly known as:  PERCOCET/ROXICET Take 1 tablet by mouth every 6 (six) hours as needed.        Consults:  None  Significant Diagnostic Studies:  Dg Chest 2 View  Result Date: 05/24/2017 CLINICAL DATA:  Shortness of breath. Back pain. Sickle cell crisis. EXAM: CHEST  2 VIEW COMPARISON:  08/04/2016 FINDINGS: The heart size and mediastinal contours are within normal limits. Both lungs are clear. Sclerotic bones of multiple vertebral deformities are consistent with the patient's history of sickle cell disease. IMPRESSION: No acute abnormalities.  Osseous changes of sickle cell disease. Electronically Signed   By: Francene BoyersJames  Maxwell M.D.   On: 05/24/2017 10:56     [facility] Course:  William Jennings, a 26 year old male with a history of sickle cell anemia, HbSC presents complaining of pain primarily to lower back, which is consistent with typical sickle cell crisis.  On admission pain intensity was 6-7/10 described as constant and throbbing.  Patient was admitted to the day infusion center  for pain management and extended observation. Review laboratory results, consistent with previous values.   Patient is hemodynamically stable. Pain management: Patient started on hypotonic IV fluids for cellular rehydration Toradol 15 mg IV x1, for inflammation Full dose Dilaudid PCA, patient opiate nave.  Patient used a total of 4.1 mg with 12 demands and 12 deliveries.   Administer oxycodone 5 mg p.o. times 45 minutes prior to discharge Pain intensity decreased to 3 out of 10 Patient alert, oriented, and ambulating We will discharge home in stable condition    Discharge instructions:  Resume all home medications Follow up in clinic in 1 month  The patient was given clear instructions to go to ER or return to medical center if symptoms do not improve, worsen or new problems develop. The patient verbalized understanding.      Physical Exam at Discharge:  BP (!) 114/55 (BP Location: Left Arm)   Pulse 84   Temp 98.3 F (36.8 C) (Oral)   Resp 16   SpO2 95%  BP (!) 114/55 (BP Location: Left Arm)   Pulse 84   Temp 98.3 F (36.8 C) (Oral)   Resp 16   SpO2 95%   General Appearance:    Alert, cooperative, no distress, appears stated age  Head:    Normocephalic, without obvious abnormality, atraumatic  Back:     Symmetric, no curvature, ROM normal, no CVA tenderness  Lungs:     Clear to auscultation bilaterally, respirations unlabored  Chest wall:    No  tenderness or deformity  Heart:    Regular rate and rhythm, S1 and S2 normal, no murmur, rub   or gallop  Abdomen:     Soft, non-tender, bowel sounds active all four quadrants,    no masses, no organomegaly  Extremities:   Extremities normal, atraumatic, no cyanosis or edema  Pulses:   2+ and symmetric all extremities  Skin:   Skin color, texture, turgor normal, no rashes or lesions  Lymph nodes:   Cervical, supraclavicular, and axillary nodes normal  Neurologic:   CNII-XII intact. Normal strength, sensation and reflexes       throughout     Disposition at Discharge: 01-Home or Self Care  Discharge Orders: Discharge Instructions    Discharge patient   Complete by:  As directed    Discharge disposition:  01-Home or Self Care   Discharge patient date:  05/26/2017      Condition at Discharge:   Stable  Time spent on Discharge:  Greater than 30 minutes.  Signed: Samari Gorby M 05/26/2017, 3:50 PM

## 2017-05-26 NOTE — H&P (Signed)
[facility] History and Physical   Date: 05/26/2017  Patient name: William Jennings Medical record number: [id number] Date of birth: 1990-12-22 Age: 26 y.o. Gender: male PCP: Massie MaroonHollis, Wilian Kwong M, FNP  Attending physician: [name], Olugbemiga E, MD  Chief Complaint: Low back pain  History of Present Illness: William ClientGeovanni Jennings, a 26 year old male with a history of sickle cell anemia, HbSC presents complaining of pain primarily to lower back, which is consistent with typical sickle cell pain.  Patient was treated and evaluated at the emergency department on 05/24/2017.   Patient states that pain is been present over the past 4 days.  He attributes current pain crisis to changes in weather.  He has been taking Percocet 5 and 325 mg as prescribed without sustained relief.  He last had Percocet around 10 PM last night.  William Jennings is hydrating and taking all prescribed medications consistently.  Current pain intensity is 6-7/10 and is described as constant and throbbing.  Patient denies headache, chest pains, shortness of breath, nausea, vomiting, diarrhea or dysuria.  Meds: Medications Prior to Admission  Medication Sig Dispense Refill Last Dose  . folic acid (FOLVITE) 1 MG tablet Take 1 tablet (1 mg total) by mouth daily. (Patient not taking: Reported on 05/25/2017) 30 tablet 11 Not Taking  . ibuprofen (ADVIL,MOTRIN) 600 MG tablet Take 1 tablet (600 mg total) by mouth every 8 (eight) hours as needed. (Patient not taking: Reported on 05/25/2017) 30 tablet 2 Not Taking  . oxyCODONE (OXY IR/ROXICODONE) 5 MG immediate release tablet Take 1 tablet (5 mg total) by mouth every 6 (six) hours as needed for severe pain. (Patient not taking: Reported on 05/25/2017) 30 tablet 0 Not Taking  . oxyCODONE-acetaminophen (PERCOCET/ROXICET) 5-325 MG tablet Take 1 tablet by mouth every 6 (six) hours as needed. 60 tablet 0     Allergies: Shellfish allergy Past Medical History:  Diagnosis Date  . Sickle cell  anemia (HCC)   . Sickle cell disease (HCC)    Past Surgical History:  Procedure Laterality Date  . CHOLECYSTECTOMY     History reviewed. No pertinent family history. Social History   Socioeconomic History  . Marital status: Single    Spouse name: Not on file  . Number of children: Not on file  . Years of education: Not on file  . Highest education level: Not on file  Social Needs  . Financial resource strain: Not on file  . Food insecurity - worry: Not on file  . Food insecurity - inability: Not on file  . Transportation needs - medical: Not on file  . Transportation needs - non-medical: Not on file  Occupational History  . Not on file  Tobacco Use  . Smoking status: Former Games developermoker  . Smokeless tobacco: Never Used  Substance and Sexual Activity  . Alcohol use: Yes  . Drug use: No  . Sexual activity: Yes  Other Topics Concern  . Not on file  Social History Narrative  . Not on file    Review of Systems: Review of Systems  HENT: Negative.   Eyes: Negative.   Respiratory: Negative.   Cardiovascular: Negative.   Gastrointestinal: Negative for blood in stool, constipation, diarrhea and melena.  Genitourinary: Negative.   Musculoskeletal: Positive for back pain.  Skin: Negative.   Neurological: Negative.   Psychiatric/Behavioral: Negative.     Physical Exam: Blood pressure 114/65, pulse 83, temperature 98.3 F (36.8 C), temperature source Oral, resp. rate 16, SpO2 100 %. BP (!) 114/55 (  BP Location: Left Arm)   Pulse 84   Temp 98.3 F (36.8 C) (Oral)   Resp 16   SpO2 95%   General Appearance:    Alert, cooperative, no distress, appears stated age  Head:    Normocephalic, without obvious abnormality, atraumatic  Eyes:    PERRL, conjunctiva/corneas clear, EOM's intact, fundi    benign, both eyes       Ears:    Normal TM's and external ear canals, both ears  Nose:   Nares normal, septum midline, mucosa normal, no drainage    or sinus tenderness  Throat:   Lips,  mucosa, and tongue normal; teeth and gums normal  Neck:   Supple, symmetrical, trachea midline, no adenopathy;       thyroid:  No enlargement/tenderness/nodules; no carotid   bruit or JVD  Back:     Symmetric, no curvature, ROM normal, no CVA tenderness  Lungs:     Clear to auscultation bilaterally, respirations unlabored  Chest wall:    No tenderness or deformity  Heart:    Regular rate and rhythm, S1 and S2 normal, no murmur, rub   or gallop  Abdomen:     Soft, non-tender, bowel sounds active all four quadrants,    no masses, no organomegaly  Extremities:   Extremities normal, atraumatic, no cyanosis or edema  Pulses:   2+ and symmetric all extremities  Skin:   Skin color, texture, turgor normal, no rashes or lesions  Lymph nodes:   Cervical, supraclavicular, and axillary nodes normal  Neurologic:   CNII-XII intact. Normal strength, sensation and reflexes      throughout    Lab results: No results found for this or any previous visit (from the past 24 hour(s)).  Imaging results:  Dg Chest 2 View  Result Date: 05/24/2017 CLINICAL DATA:  Shortness of breath. Back pain. Sickle cell crisis. EXAM: CHEST  2 VIEW COMPARISON:  08/04/2016 FINDINGS: The heart size and mediastinal contours are within normal limits. Both lungs are clear. Sclerotic bones of multiple vertebral deformities are consistent with the patient's history of sickle cell disease. IMPRESSION: No acute abnormalities.  Osseous changes of sickle cell disease. Electronically Signed   By: Francene BoyersJames  Maxwell M.D.   On: 05/24/2017 10:56     Assessment & Plan:  Patient will be admitted to the day infusion center for extended observation  Start IV D5.45 for cellular rehydration at 125/hr  Start Toradol 15 mg IV every 6 hours for inflammation.  Start Dilaudid PCA High Concentration per weight based protocol.   Patient will be re-evaluated for pain intensity in the context of function and relationship to baseline as care  progresses.  If no significant pain relief, will transfer patient to inpatient services for a higher level of care.   Will check CMP and CBC w/differential  Shenea Giacobbe M 05/26/2017, 10:51 AM

## 2017-06-01 LAB — TOXASSURE SELECT 13 (MW), URINE

## 2017-06-06 ENCOUNTER — Other Ambulatory Visit: Payer: Self-pay

## 2017-06-06 ENCOUNTER — Encounter (HOSPITAL_BASED_OUTPATIENT_CLINIC_OR_DEPARTMENT_OTHER): Payer: Self-pay | Admitting: *Deleted

## 2017-06-06 ENCOUNTER — Emergency Department (HOSPITAL_BASED_OUTPATIENT_CLINIC_OR_DEPARTMENT_OTHER)
Admission: EM | Admit: 2017-06-06 | Discharge: 2017-06-07 | Disposition: A | Discharge status: Home / Self Care | Payer: Medicaid Other | Attending: Emergency Medicine | Admitting: Emergency Medicine

## 2017-06-06 ENCOUNTER — Emergency Department (HOSPITAL_BASED_OUTPATIENT_CLINIC_OR_DEPARTMENT_OTHER): Payer: Medicaid Other

## 2017-06-06 DIAGNOSIS — R52 Pain, unspecified: Secondary | ICD-10-CM | POA: Diagnosis present

## 2017-06-06 DIAGNOSIS — Z87891 Personal history of nicotine dependence: Secondary | ICD-10-CM | POA: Diagnosis not present

## 2017-06-06 DIAGNOSIS — D5700 Hb-SS disease with crisis, unspecified: Secondary | ICD-10-CM | POA: Insufficient documentation

## 2017-06-06 LAB — COMPREHENSIVE METABOLIC PANEL
ALBUMIN: 4.2 g/dL (ref 3.5–5.0)
ALT: 17 U/L (ref 17–63)
AST: 32 U/L (ref 15–41)
Alkaline Phosphatase: 115 U/L (ref 38–126)
Anion gap: 9 (ref 5–15)
BUN: 7 mg/dL (ref 6–20)
CHLORIDE: 105 mmol/L (ref 101–111)
CO2: 23 mmol/L (ref 22–32)
CREATININE: 0.98 mg/dL (ref 0.61–1.24)
Calcium: 8.9 mg/dL (ref 8.9–10.3)
GFR calc non Af Amer: >60 mL/min (ref >60)
GLUCOSE: 145 mg/dL — AB (ref 65–99)
Potassium: 3.7 mmol/L (ref 3.5–5.1)
SODIUM: 137 mmol/L (ref 135–145)
Total Bilirubin: 1.6 mg/dL — ABNORMAL HIGH (ref 0.3–1.2)
Total Protein: 7.5 g/dL (ref 6.5–8.1)

## 2017-06-06 LAB — CBC WITH DIFFERENTIAL/PLATELET
BASOS ABS: 0 10*3/uL (ref 0.0–0.1)
BASOS PCT: 0 %
EOS ABS: 0.1 10*3/uL (ref 0.0–0.7)
EOS PCT: 1 %
HEMATOCRIT: 31.1 % — AB (ref 39.0–52.0)
Hemoglobin: 11.4 g/dL — ABNORMAL LOW (ref 13.0–17.0)
Lymphocytes Relative: 25 %
Lymphs Abs: 3.2 10*3/uL (ref 0.7–4.0)
MCH: 28.3 pg (ref 26.0–34.0)
MCHC: 36.7 g/dL — AB (ref 30.0–36.0)
MCV: 77.2 fL — ABNORMAL LOW (ref 78.0–100.0)
Monocytes Absolute: 1.5 10*3/uL — ABNORMAL HIGH (ref 0.1–1.0)
Monocytes Relative: 12 %
NEUTROS ABS: 7.8 10*3/uL — AB (ref 1.7–7.7)
NEUTROS PCT: 62 %
Platelets: 215 10*3/uL (ref 150–400)
RBC: 4.03 MIL/uL — AB (ref 4.22–5.81)
RDW: 14.8 % (ref 11.5–15.5)
WBC: 12.6 10*3/uL — ABNORMAL HIGH (ref 4.0–10.5)

## 2017-06-06 MED ORDER — HYDROMORPHONE HCL 1 MG/ML IJ SOLN: 1.0000 mg | INTRAMUSCULAR | Status: AC

## 2017-06-06 MED ORDER — DEXTROSE-NACL 5-0.45 % IV SOLN
INTRAVENOUS | Status: DC
  Administered 2017-06-06: 23:00:00 via INTRAVENOUS

## 2017-06-06 MED ORDER — HYDROMORPHONE HCL 1 MG/ML IJ SOLN
0.5000 mg | INTRAMUSCULAR | Status: AC
  Administered 2017-06-06: 0.5 mg via INTRAVENOUS
  Filled 2017-06-06: qty 1

## 2017-06-06 MED ORDER — HYDROMORPHONE HCL 1 MG/ML IJ SOLN
1.0000 mg | INTRAMUSCULAR | Status: AC
  Administered 2017-06-06: 1 mg via INTRAVENOUS

## 2017-06-06 MED ORDER — HYDROMORPHONE HCL 1 MG/ML IJ SOLN
1.0000 mg | INTRAMUSCULAR | Status: AC
  Administered 2017-06-06: 1 mg via INTRAVENOUS
  Filled 2017-06-06 (×2): qty 1

## 2017-06-06 MED ORDER — DIPHENHYDRAMINE HCL 25 MG PO CAPS
25.0000 mg | ORAL_CAPSULE | Freq: Once | ORAL | Status: AC
Start: 2017-06-06 — End: 2017-06-06
  Administered 2017-06-06: 25 mg via ORAL
  Filled 2017-06-06: qty 1

## 2017-06-06 MED ORDER — KETOROLAC TROMETHAMINE 30 MG/ML IJ SOLN
30.0000 mg | INTRAMUSCULAR | Status: AC
  Administered 2017-06-06: 30 mg via INTRAVENOUS
  Filled 2017-06-06: qty 1

## 2017-06-06 MED ORDER — HYDROMORPHONE HCL 1 MG/ML IJ SOLN: 0.5000 mg | INTRAMUSCULAR | Status: AC

## 2017-06-06 NOTE — Discharge Instructions (Signed)
Continue your home medications as previously prescribed. Follow-up with your primary care provider for further evaluation. Return to ED for worsening pain, chest pain, fevers, coughing up blood, severe vomiting or abdominal pain, injuries or falls.

## 2017-06-06 NOTE — ED Notes (Signed)
Alert, NAD, calm, interactive, resps e/u, speaking in clear complete sentences, no dyspnea noted, skin W&D, VSS, c/o generalized pain c/w SCC, also sob, (denies: nausea, fever, dizziness or visual changes). EDPA into room. [facility]

## 2017-06-06 NOTE — ED Notes (Signed)
Alert, NAD, calm, "feel better", EDPA into room to update and assess pt.

## 2017-06-06 NOTE — ED Notes (Signed)
To xray via stretcher, no changes, NAD, calm, interactive, resps e/u.

## 2017-06-06 NOTE — ED Triage Notes (Signed)
Pt c/o chronic " sickle cell pain " x 2 days

## 2017-06-06 NOTE — ED Provider Notes (Signed)
[facility] EMERGENCY DEPARTMENT Provider Note   CSN: [id number] Arrival date & time: 06/06/17  2200     History   Chief Complaint Chief Complaint  Patient presents with  . Pain    HPI William Jennings is a 26 y.o. male with a past medical history of sickle cell disease, presents to ED for evaluation of bilateral upper and lower extremity pain, chest pain, back pain that began 2 days ago.  States that this feels different than his previous sickle cell crises and that usually he has one specific area of pain and is not diffuse like this.  He tried his home Percocet with no relief in his symptoms.  He does not have his oxycodone immediate release at home, although he was prescribed this by his sickle cell specialist.  He denies any fevers, shortness of breath, hemoptysis, prior MI, DVT or PE, nausea, vomiting, abdominal pain, bowel changes, urinary symptoms, wheezing, injuries or falls.  He is unsure if he has had acute chest syndrome in the past or as a child. Also reports compliance with his home folic acid.  HPI  Past Medical History:  Diagnosis Date  . Sickle cell anemia (HCC)   . Sickle cell disease [facility])     Patient Active Problem List   Diagnosis Date Noted  . Sickle cell anemia with pain (HCC) 04/05/2016  . Acute chest syndrome due to sickle cell crisis (HCC)   . Sickle cell anemia with crisis (HCC) 02/21/2014  . Sickle cell anemia (HCC) 08/10/2013  . Chest pain 08/10/2013  . Dehydration 08/08/2013  . Sickle cell crisis (HCC) 08/06/2013  . 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 CHOLECYSTECTOMY         Home Medications    Prior to Admission medications   Medication Sig Start Date End Date Taking? Authorizing Provider  oxyCODONE-acetaminophen (PERCOCET/ROXICET) 5-325 MG tablet Take 1 tablet by mouth every 6 (six) hours as needed. 05/25/17  Yes  Massie Maroon, FNP  folic acid (FOLVITE) 1 MG tablet Take 1 tablet (1 mg total) by mouth daily. Patient not taking: Reported on 05/25/2017 11/26/16 11/26/17  Massie Maroon, FNP  ibuprofen (ADVIL,MOTRIN) 600 MG tablet Take 1 tablet (600 mg total) by mouth every 8 (eight) hours as needed. Patient not taking: Reported on 05/25/2017 11/26/16   Massie Maroon, FNP  oxyCODONE (OXY IR/ROXICODONE) 5 MG immediate release tablet Take 1 tablet (5 mg total) by mouth every 6 (six) hours as needed for severe pain. Patient not taking: Reported on 05/25/2017 11/26/16   Massie Maroon, FNP    Family History History reviewed. No pertinent family history.  Social History Social History   Tobacco Use  . Smoking status: Former Games developer  . Smokeless tobacco: Never Used  Substance Use Topics  . Alcohol use: Yes    Comment: soc  . Drug use: No     Allergies   Shellfish allergy   Review of Systems Review of Systems  Constitutional: Negative for appetite change, chills and fever.  HENT: Negative for ear pain, rhinorrhea, sneezing and sore throat.   Eyes: Negative for photophobia and visual disturbance.  Respiratory: Negative for cough, chest tightness, shortness of breath and wheezing.   Cardiovascular: Positive for chest pain. Negative for palpitations.  Gastrointestinal: Negative for abdominal pain, blood in stool, constipation, diarrhea, nausea and vomiting.  Genitourinary: Negative for dysuria,  hematuria and urgency.  Musculoskeletal: Positive for back pain and myalgias.  Skin: Negative for rash.  Neurological: Negative for dizziness, weakness and light-headedness.     Physical Exam Updated Vital Signs BP 122/73   Pulse 91   Temp 98.8 F (37.1 C)   Resp (!) 25   Ht 5\' 8"  (1.727 m)   Wt 65.8 kg (145 lb)   SpO2 99%   BMI 22.05 kg/m   Physical Exam  Constitutional: He appears well-developed and well-nourished. No distress.  Does appear uncomfortable.  HENT:  Head:  Normocephalic and atraumatic.  Nose: Nose normal.  Eyes: Conjunctivae and EOM are normal. Right eye exhibits no discharge. Left eye exhibits no discharge. No scleral icterus.  Neck: Normal range of motion. Neck supple.  Cardiovascular: Regular rhythm, normal heart sounds and intact distal pulses. Tachycardia present. Exam reveals no gallop and no friction rub.  No murmur heard. Pulmonary/Chest: Effort normal and breath sounds normal. No respiratory distress.  Abdominal: Soft. Bowel sounds are normal. He exhibits no distension. There is no tenderness. There is no guarding.  Musculoskeletal: Normal range of motion. He exhibits tenderness. He exhibits no edema.  Diffuse tenderness to palpation of upper and lower extremities, torso including chest.  Neurological: He is alert. He exhibits normal muscle tone. Coordination normal.  Skin: Skin is warm and dry. No rash noted.  Psychiatric: He has a normal mood and affect.  Nursing note and vitals reviewed.    ED Treatments / Results  Labs (all labs ordered are listed, but only abnormal results are displayed) Labs Reviewed  COMPREHENSIVE METABOLIC PANEL - Abnormal; Notable for the following components:      Result Value   Glucose, Bld 145 (*)    Total Bilirubin 1.6 (*)    All other components within normal limits  CBC WITH DIFFERENTIAL/PLATELET - Abnormal; Notable for the following components:   WBC 12.6 (*)    RBC 4.03 (*)    Hemoglobin 11.4 (*)    HCT 31.1 (*)    MCV 77.2 (*)    MCHC 36.7 (*)    Neutro Abs 7.8 (*)    Monocytes Absolute 1.5 (*)    All other components within normal limits  RETICULOCYTES    EKG  EKG Interpretation  Date/Time:  Monday June 06 2017 22:10:52 EST Ventricular Rate:  99 PR Interval:    QRS Duration: 82 QT Interval:  332 QTC Calculation: 426 R Axis:   70 Text Interpretation:  Sinus rhythm Borderline T abnormalities, lateral leads Borderline ST elevation, anterior leads ST changes similar to prior.  No STEMI.  Confirmed by Alona BeneLong, Joshua [phone]([id number]) on 06/06/2017 10:38:54 PM       Radiology Dg Chest 2 View  Result Date: 06/06/2017 CLINICAL DATA:  26 year old male with chest pain. History of sickle cell. EXAM: CHEST  2 VIEW COMPARISON:  Chest radiograph dated 05/24/2017 FINDINGS: The lungs are clear. There is no pleural effusion or pneumothorax. The cardiac silhouette is within normal limits. Sclerotic changes of the humeral head as far as central depression of the vertebral body endplates. No acute osseous pathology. IMPRESSION: 1. No acute cardiopulmonary process. 2. Osseous stigmata of sickle cell disease. Electronically Signed   By: Elgie CollardArash  Radparvar M.D.   On: 06/06/2017 22:43    Procedures Procedures (including critical care time)  Medications Ordered in ED Medications  dextrose 5 %-0.45 % sodium chloride infusion ( Intravenous New Bag/Given 06/06/17 2244)  HYDROmorphone (DILAUDID) injection 1 mg (not administered)    Or  HYDROmorphone (DILAUDID) injection 1 mg (not administered)  ketorolac (TORADOL) 30 MG/ML injection 30 mg (30 mg Intravenous Given 06/06/17 2230)  HYDROmorphone (DILAUDID) injection 0.5 mg (0.5 mg Intravenous Given 06/06/17 2230)    Or  HYDROmorphone (DILAUDID) injection 0.5 mg ( Subcutaneous See Alternative 06/06/17 2230)  HYDROmorphone (DILAUDID) injection 1 mg (1 mg Intravenous Given 06/06/17 2301)    Or  HYDROmorphone (DILAUDID) injection 1 mg ( Subcutaneous See Alternative 06/06/17 2301)  diphenhydrAMINE (BENADRYL) capsule 25 mg (25 mg Oral Given 06/06/17 2230)     Initial Impression / Assessment and Plan / ED Course  I have reviewed the triage vital signs and the nursing notes.  Pertinent labs & imaging results that were available during my care of the patient were reviewed by me and considered in my medical decision making (see chart for details).     Patient, with a past medical history of sickle cell disease, who presents to ED for evaluation of  2-day history of diffuse pain in upper and lower extremities, back and chest.  Reports this is somewhat different than his usual pain crises which she states he does not have very common.  Usually he has one specific area of pain.  He denies any shortness of breath, hemoptysis, vomiting, fevers, wheezing, urinary symptoms, diarrhea or abdominal pain.  He has tried his home Percocet with no relief in symptoms.  He was prescribed medial release oxycodone however does not have this at home.  On physical exam patient does appear uncomfortable.  He has diffuse tenderness to palpation of the front and back of the torso as well as bilateral upper and lower extremities.  He is mildly tachycardic to low 100s.  He is satting at 99-100% on room air.  He is afebrile.  He is not tachypneic.  Due to presence of chest pain, EKG and chest x-ray were done.  Chest x-ray with no acute abnormalities.  EKG with tracing similar to previous.  Lab work shows stable hemoglobin hematocrit at 11.4 and 31.1 which appears similar to his baseline.  Mild leukocytosis as 12.6 which could be reactive.  CMP unremarkable. Patient reports much improvement in his symptoms with Toradol, 2 doses of Dilaudid and Benadryl given here in the ED.  He is resting comfortably on his bed and speaking on the phone and tolerating p.o. intake well.  States that he would like to try 1 more dose of pain medication to help completely resolve his symptoms.  Will reassess after last dose of pain medication.  Heart rate has improved.  Patient reports complete resolution of his symptoms with third dose of pain medication.  He continues to tolerate p.o. intake without difficulty.  I encouraged him to follow-up with his primary care provider and to continue his home medications as previously prescribed. I have low suspicion for acute chest syndrome or other severe, life threatening or infectious cause of his symptoms. Patient appears stable for discharge at this time.  Strict return precautions given.  Final Clinical Impressions(s) / ED Diagnoses   Final diagnoses:  Sickle cell pain crisis [facility](HCC)    ED Discharge Orders    None     Portions of this note were generated with Dragon dictation software. Dictation errors may occur despite best attempts at proofreading.    Dietrich PatesKhatri, Aneth Schlagel, PA-C 06/07/17 0003    Maia PlanLong, Joshua G, MD 06/07/17 [phone]

## 2017-06-07 LAB — RETICULOCYTES
RBC.: 4.03 MIL/uL — ABNORMAL LOW (ref 4.22–5.81)
RETIC COUNT ABSOLUTE: 221.7 10*3/uL — AB (ref 19.0–186.0)
Retic Ct Pct: 5.5 % — ABNORMAL HIGH (ref 0.4–3.1)

## 2017-06-10 ENCOUNTER — Telehealth: Payer: Self-pay | Admitting: Family Medicine

## 2017-06-10 ENCOUNTER — Ambulatory Visit: Payer: Medicaid Other | Admitting: Family Medicine

## 2017-06-10 NOTE — Telephone Encounter (Signed)
William Jennings, a 26 year old male with a history of sickle cell anemia, HbSC called complaining of increased pain that is not controlled on percocet 5-325 mg every 6 hours. Patient has been treated and evaluated in the emergency department 3 times over the past month. Patient advised to come in for an office visit today at 1:30 for medication management.   William NationsLachina Moore [name]  MSN, FNP-C Patient Care [facility] [address]  [place], [place] [id number] [phone]

## 2017-06-11 ENCOUNTER — Emergency Department (HOSPITAL_BASED_OUTPATIENT_CLINIC_OR_DEPARTMENT_OTHER): Payer: Medicaid Other

## 2017-06-11 ENCOUNTER — Encounter (HOSPITAL_BASED_OUTPATIENT_CLINIC_OR_DEPARTMENT_OTHER): Payer: Self-pay | Admitting: Emergency Medicine

## 2017-06-11 ENCOUNTER — Emergency Department (HOSPITAL_BASED_OUTPATIENT_CLINIC_OR_DEPARTMENT_OTHER)
Admission: EM | Admit: 2017-06-11 | Discharge: 2017-06-11 | Disposition: A | Discharge status: Home / Self Care | Payer: Medicaid Other | Attending: Emergency Medicine | Admitting: Emergency Medicine

## 2017-06-11 ENCOUNTER — Other Ambulatory Visit: Payer: Self-pay

## 2017-06-11 DIAGNOSIS — D5700 Hb-SS disease with crisis, unspecified: Secondary | ICD-10-CM

## 2017-06-11 DIAGNOSIS — D57219 Sickle-cell/Hb-C disease with crisis, unspecified: Secondary | ICD-10-CM | POA: Insufficient documentation

## 2017-06-11 DIAGNOSIS — Z9049 Acquired absence of other specified parts of digestive tract: Secondary | ICD-10-CM | POA: Insufficient documentation

## 2017-06-11 DIAGNOSIS — R079 Chest pain, unspecified: Secondary | ICD-10-CM | POA: Diagnosis present

## 2017-06-11 DIAGNOSIS — Z87891 Personal history of nicotine dependence: Secondary | ICD-10-CM | POA: Insufficient documentation

## 2017-06-11 DIAGNOSIS — R072 Precordial pain: Secondary | ICD-10-CM | POA: Insufficient documentation

## 2017-06-11 LAB — CBC WITH DIFFERENTIAL/PLATELET
Basophils Absolute: 0 10*3/uL (ref 0.0–0.1)
Basophils Relative: 0 %
EOS PCT: 0 %
Eosinophils Absolute: 0 10*3/uL (ref 0.0–0.7)
HEMATOCRIT: 28.3 % — AB (ref 39.0–52.0)
Hemoglobin: 10.2 g/dL — ABNORMAL LOW (ref 13.0–17.0)
LYMPHS ABS: 1.6 10*3/uL (ref 0.7–4.0)
LYMPHS PCT: 22 %
MCH: 27.3 pg (ref 26.0–34.0)
MCHC: 36 g/dL (ref 30.0–36.0)
MCV: 75.9 fL — AB (ref 78.0–100.0)
MONO ABS: 1.3 10*3/uL — AB (ref 0.1–1.0)
MONOS PCT: 17 %
NEUTROS ABS: 4.7 10*3/uL (ref 1.7–7.7)
Neutrophils Relative %: 61 %
PLATELETS: 136 10*3/uL — AB (ref 150–400)
RBC: 3.73 MIL/uL — ABNORMAL LOW (ref 4.22–5.81)
RDW: 15 % (ref 11.5–15.5)
WBC: 7.6 10*3/uL (ref 4.0–10.5)

## 2017-06-11 LAB — COMPREHENSIVE METABOLIC PANEL
ALBUMIN: 4 g/dL (ref 3.5–5.0)
ALK PHOS: 110 U/L (ref 38–126)
ALT: 47 U/L (ref 17–63)
ANION GAP: 10 (ref 5–15)
AST: 53 U/L — AB (ref 15–41)
BILIRUBIN TOTAL: 1.6 mg/dL — AB (ref 0.3–1.2)
BUN: 7 mg/dL (ref 6–20)
CO2: 22 mmol/L (ref 22–32)
Calcium: 8.9 mg/dL (ref 8.9–10.3)
Chloride: 104 mmol/L (ref 101–111)
Creatinine, Ser: 0.81 mg/dL (ref 0.61–1.24)
GFR calc Af Amer: >60 mL/min (ref >60)
GFR calc non Af Amer: >60 mL/min (ref >60)
GLUCOSE: 98 mg/dL (ref 65–99)
POTASSIUM: 3.5 mmol/L (ref 3.5–5.1)
SODIUM: 136 mmol/L (ref 135–145)
Total Protein: 7.8 g/dL (ref 6.5–8.1)

## 2017-06-11 LAB — RETICULOCYTES
RBC.: 3.81 MIL/uL — ABNORMAL LOW (ref 4.22–5.81)
RETIC COUNT ABSOLUTE: 114.3 10*3/uL (ref 19.0–186.0)
Retic Ct Pct: 3 % (ref 0.4–3.1)

## 2017-06-11 MED ORDER — DICYCLOMINE HCL 10 MG PO CAPS
10.0000 mg | ORAL_CAPSULE | Freq: Once | ORAL | Status: AC
  Administered 2017-06-11: 10 mg via ORAL
  Filled 2017-06-11: qty 1

## 2017-06-11 MED ORDER — GI COCKTAIL ~~LOC~~
30.0000 mL | Freq: Once | ORAL | Status: AC
  Administered 2017-06-11: 30 mL via ORAL
  Filled 2017-06-11: qty 30

## 2017-06-11 MED ORDER — HYDROMORPHONE HCL 1 MG/ML IJ SOLN: 1.0000 mg | INTRAMUSCULAR | Status: AC

## 2017-06-11 MED ORDER — HYDROMORPHONE HCL 1 MG/ML IJ SOLN
0.5000 mg | INTRAMUSCULAR | Status: AC
  Administered 2017-06-11: 0.5 mg via INTRAVENOUS

## 2017-06-11 MED ORDER — HYDROMORPHONE HCL 1 MG/ML IJ SOLN: 1.0000 mg | INTRAMUSCULAR | Status: DC

## 2017-06-11 MED ORDER — DEXTROSE-NACL 5-0.45 % IV SOLN
INTRAVENOUS | Status: DC
  Administered 2017-06-11: 14:00:00 via INTRAVENOUS

## 2017-06-11 MED ORDER — IOPAMIDOL (ISOVUE-370) INJECTION 76%
100.0000 mL | Freq: Once | INTRAVENOUS | Status: AC | PRN
  Administered 2017-06-11: 100 mL via INTRAVENOUS

## 2017-06-11 MED ORDER — DIPHENHYDRAMINE HCL 25 MG PO CAPS
25.0000 mg | ORAL_CAPSULE | ORAL | Status: DC | PRN
Start: 2017-06-11 — End: 2017-06-11
  Filled 2017-06-11: qty 1

## 2017-06-11 MED ORDER — ONDANSETRON HCL 4 MG/2ML IJ SOLN
4.0000 mg | INTRAMUSCULAR | Status: DC | PRN
  Filled 2017-06-11: qty 2

## 2017-06-11 MED ORDER — HYDROMORPHONE HCL 1 MG/ML IJ SOLN
0.5000 mg | INTRAMUSCULAR | Status: AC
  Filled 2017-06-11: qty 1

## 2017-06-11 MED ORDER — KETOROLAC TROMETHAMINE 30 MG/ML IJ SOLN
30.0000 mg | INTRAMUSCULAR | Status: AC
  Administered 2017-06-11: 30 mg via INTRAVENOUS
  Filled 2017-06-11: qty 1

## 2017-06-11 MED ORDER — HYDROMORPHONE HCL 1 MG/ML IJ SOLN
1.0000 mg | INTRAMUSCULAR | Status: AC
  Administered 2017-06-11: 1 mg via INTRAVENOUS
  Filled 2017-06-11: qty 1

## 2017-06-11 NOTE — ED Notes (Signed)
Patient transported to CT 

## 2017-06-11 NOTE — Discharge Instructions (Signed)

## 2017-06-11 NOTE — ED Triage Notes (Signed)
Pt c/o sickle cell crisis onset Monday. Pt seen here Tuesday for same. Pt continues to have chest pain described as pressure. Pt took oxycodone 5 mg and ibuprofen without relief of chest pain. Pt reports pain in legs and back has resolved.

## 2017-06-11 NOTE — ED Notes (Signed)
ED Provider at bedside. Dr. Long 

## 2017-06-11 NOTE — ED Provider Notes (Signed)
Emergency Department Provider Note   I have reviewed the triage vital signs and the nursing notes.   HISTORY  Chief Complaint Sickle Cell Pain Crisis   HPI William Jennings is a 26 y.o. male with PMH of Sickle Cell Disease presents emergency department for evaluation of sickle cell pain in the left chest.  Symptoms have been ongoing for the past 2-3 days.  He has some worsening pain with touching the area and movement.  He denies any fevers, shaking chills, significant dyspnea.  He does have some pain with breathing.  Patient states that he has sickle cell flares infrequently and tried some of his home pain medications prior to ED presentation.  He has been drinking lots of fluids.  He denies pain in the arms or legs which is where his sickle cell typically presents.    Past Medical History:  Diagnosis Date  . Sickle cell anemia (HCC)   . Sickle cell disease [facility](HCC)     Patient Active Problem List   Diagnosis Date Noted  . Sickle cell anemia with pain (HCC) 04/05/2016  . Acute chest syndrome due to sickle cell crisis (HCC)   . Sickle cell anemia with crisis (HCC) 02/21/2014  . Sickle cell anemia (HCC) 08/10/2013  . Chest pain 08/10/2013  . Dehydration 08/08/2013  . Sickle cell crisis (HCC) 08/06/2013  . 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 CHOLECYSTECTOMY      Current Outpatient Rx  . Order #: [id number] Class: Print  . Order #: [id number] Class: Print  . Order #: [id number] Class: Print  . Order #: [id number] Class: Print    Allergies Shellfish allergy  No family history on file.  Social History Social History   Tobacco Use  . Smoking status: Former Games developermoker  . Smokeless tobacco: Never Used  Substance Use Topics  . Alcohol use: Yes    Comment: soc  . Drug use: No    Review of Systems  Constitutional: No fever/chills Eyes: No visual changes. ENT:  No sore throat. Cardiovascular: Positive chest pain. Respiratory: Denies shortness of breath. Gastrointestinal: No abdominal pain.  No nausea, no vomiting.  No diarrhea.  No constipation. Genitourinary: Negative for dysuria. Musculoskeletal: Negative for back pain. Skin: Negative for rash. Neurological: Negative for headaches, focal weakness or numbness.  10-point ROS otherwise negative.  ____________________________________________   PHYSICAL EXAM:  VITAL SIGNS: ED Triage Vitals  Enc Vitals Group     BP 06/11/17 1312 113/71     Pulse Rate 06/11/17 1312 92     Resp 06/11/17 1312 20     Temp 06/11/17 1312 98.5 F (36.9 C)     Temp Source 06/11/17 1312 Oral     SpO2 06/11/17 1312 100 %     Weight 06/11/17 1336 150 lb (68 kg)     Pain Score 06/11/17 1318 8   Constitutional: Alert and oriented. Well appearing and in no acute distress. Eyes: Conjunctivae are normal. Head: Atraumatic. Nose: No congestion/rhinnorhea. Mouth/Throat: Mucous membranes are moist.  Oropharynx non-erythematous. Neck: No stridor.  Cardiovascular: Normal rate, regular rhythm. Good peripheral circulation. Grossly normal heart sounds.   Respiratory: Normal respiratory effort.  No retractions. Lungs CTAB. Gastrointestinal: Soft and nontender. No distention.  Musculoskeletal: No lower extremity tenderness nor edema. No gross deformities of extremities. Positive focal tenderness to palpation of the ribs over the left anterior, superior chest wall. No crepitus, ecchymosis, or other  abnormality.  Neurologic:  Normal speech and language. No gross focal neurologic deficits are appreciated.  Skin:  Skin is warm, dry and intact. No rash noted.  ____________________________________________   LABS (all labs ordered are listed, but only abnormal results are displayed)  Labs Reviewed  COMPREHENSIVE METABOLIC PANEL - Abnormal; Notable for the following components:      Result Value   AST 53 (*)    Total Bilirubin  1.6 (*)    All other components within normal limits  CBC WITH DIFFERENTIAL/PLATELET - Abnormal; Notable for the following components:   RBC 3.73 (*)    Hemoglobin 10.2 (*)    HCT 28.3 (*)    MCV 75.9 (*)    Platelets 136 (*)    Monocytes Absolute 1.3 (*)    All other components within normal limits  RETICULOCYTES - Abnormal; Notable for the following components:   RBC. 3.81 (*)    All other components within normal limits   ____________________________________________  EKG   EKG Interpretation  Date/Time:  Saturday June 11 2017 13:32:59 EST Ventricular Rate:  84 PR Interval:    QRS Duration: 88 QT Interval:  326 QTC Calculation: 386 R Axis:   55 Text Interpretation:  Sinus rhythm Nonspecific T abnormalities, lateral leads No STEMI. Similar to prior.  Confirmed by Alona BeneLong, Avi Archuleta [phone]) on 06/11/2017 3:01:46 PM       ____________________________________________  RADIOLOGY  Dg Chest 2 View  Result Date: 06/11/2017 CLINICAL DATA:  26 year old male with a history of sickle cell EXAM: CHEST  2 VIEW COMPARISON:  06/06/2017 FINDINGS: Cardiomediastinal silhouette within normal limits. No evidence of central vascular congestion. No interlobular septal thickening. No confluent airspace disease. No pleural effusion or pneumothorax. No displaced fracture. IMPRESSION: No radiographic evidence of acute cardiopulmonary disease Electronically Signed   By: Gilmer MorJaime  Wagner D.O.   On: 06/11/2017 14:20   Ct Angio Chest Pe W And/or Wo Contrast  Result Date: 06/11/2017 CLINICAL DATA:  History of sickle cell disease with chest pain for several days, initial encounter EXAM: CT ANGIOGRAPHY CHEST WITH CONTRAST TECHNIQUE: Multidetector CT imaging of the chest was performed using the standard protocol during bolus administration of intravenous contrast. Multiplanar CT image reconstructions and MIPs were obtained to evaluate the vascular anatomy. CONTRAST:  100mL ISOVUE-370 IOPAMIDOL (ISOVUE-370)  INJECTION 76% COMPARISON:  06/11/2017 FINDINGS: Cardiovascular: Thoracic aorta is within normal limits. Pulmonary artery demonstrates a normal branching pattern. No intraluminal filling defect to suggest pulmonary embolism is identified. No significant cardiac enlargement is seen. No coronary calcifications are noted. Mediastinum/Nodes: Thoracic inlet is within normal limits. No hilar or mediastinal adenopathy is noted. Mild residual thymus tissue is seen. The esophagus is within normal limits. Lungs/Pleura: Lungs are clear. No pleural effusion or pneumothorax. Upper Abdomen: No acute abnormality. Musculoskeletal: Multiple deformities are noted in the thoracic spine consistent with the given clinical history of sickle cell disease. Diffuse mottled density is noted throughout the bony structures consistent with the underlying clinical history. Review of the MIP images confirms the above findings. IMPRESSION: No evidence of pulmonary emboli. No acute abnormality is noted. Bony changes consistent with the given clinical history of sickle cell disease. Electronically Signed   By: Alcide CleverMark  Lukens M.D.   On: 06/11/2017 16:24    ____________________________________________   PROCEDURES  Procedure(s) performed:   Procedures  None ____________________________________________   INITIAL IMPRESSION / ASSESSMENT AND PLAN / ED COURSE  Pertinent labs & imaging results that were available during my care of the patient were reviewed by  me and considered in my medical decision making (see chart for details).  Presents emergency department for evaluation of left upper chest pain. Was seen last week for more diffuse symptoms but CP continues. Some pleuritic component but also tender to palpation. Plan for CXR, labs, pain control and reassess.   CTA negative. Labs unremarkable. Patient with relief in pain symptoms with pain meds. Will discharge home at this time.   At this time, I do not feel there is any  life-threatening condition present. I have reviewed and discussed all results (EKG, imaging, lab, urine as appropriate), exam findings with patient. I have reviewed nursing notes and appropriate previous records.  I feel the patient is safe to be discharged home without further emergent workup. Discussed usual and customary return precautions. Patient and family (if present) verbalize understanding and are comfortable with this plan.  Patient will follow-up with their primary care provider. If they do not have a primary care provider, information for follow-up has been provided to them. All questions have been answered.  ____________________________________________  FINAL CLINICAL IMPRESSION(S) / ED DIAGNOSES  Final diagnoses:  Sickle cell pain crisis (HCC)  Precordial chest pain     MEDICATIONS GIVEN DURING THIS VISIT:  Medications  dextrose 5 %-0.45 % sodium chloride infusion ( Intravenous Stopped 06/11/17 1645)  diphenhydrAMINE (BENADRYL) capsule 25-50 mg (not administered)  ondansetron (ZOFRAN) injection 4 mg (not administered)  HYDROmorphone (DILAUDID) injection 1 mg (not administered)    Or  HYDROmorphone (DILAUDID) injection 1 mg (not administered)  HYDROmorphone (DILAUDID) injection 1 mg (not administered)    Or  HYDROmorphone (DILAUDID) injection 1 mg (not administered)  ketorolac (TORADOL) 30 MG/ML injection 30 mg (30 mg Intravenous Given 06/11/17 1423)  HYDROmorphone (DILAUDID) injection 0.5 mg (0.5 mg Intravenous Given 06/11/17 1425)    Or  HYDROmorphone (DILAUDID) injection 0.5 mg ( Subcutaneous See Alternative 06/11/17 1425)  HYDROmorphone (DILAUDID) injection 1 mg (1 mg Intravenous Given 06/11/17 1459)    Or  HYDROmorphone (DILAUDID) injection 1 mg ( Subcutaneous See Alternative 06/11/17 1459)  gi cocktail (Maalox,Lidocaine,Donnatal) (30 mLs Oral Given 06/11/17 1511)  dicyclomine (BENTYL) capsule 10 mg (10 mg Oral Given 06/11/17 1526)  iopamidol (ISOVUE-370) 76 %  injection 100 mL (100 mLs Intravenous Contrast Given 06/11/17 1554)    Note:  This document was prepared using Dragon voice recognition software and may include unintentional dictation errors.  Alona Bene, MD Emergency Medicine    Niamh Rada, Arlyss Repress, MD 06/11/17 [phone]

## 2017-06-11 NOTE — ED Notes (Signed)
Pt given d/c instructions as per chart. Verbalizes understanding. No questions. 

## 2017-06-11 NOTE — ED Notes (Signed)
Dr. Jacqulyn BathLong advised pt c/o upper abdominal pain. Denies nausea. States he hasn't eaten. Snack provided

## 2017-06-11 NOTE — ED Notes (Signed)
Patient transported to X-ray 

## 2017-06-11 NOTE — ED Notes (Signed)
Family members x 2 given ice cream.

## 2017-06-22 ENCOUNTER — Non-Acute Institutional Stay (HOSPITAL_COMMUNITY)
Admission: AD | Admit: 2017-06-22 | Discharge: 2017-06-22 | Disposition: A | Discharge status: Home / Self Care | Payer: 59 | Source: Ambulatory Visit | Attending: Internal Medicine | Admitting: Internal Medicine

## 2017-06-22 ENCOUNTER — Telehealth (HOSPITAL_COMMUNITY): Payer: Self-pay | Admitting: *Deleted

## 2017-06-22 ENCOUNTER — Encounter (HOSPITAL_COMMUNITY): Payer: Self-pay | Admitting: Family Medicine

## 2017-06-22 DIAGNOSIS — D57219 Sickle-cell/Hb-C disease with crisis, unspecified: Secondary | ICD-10-CM | POA: Diagnosis present

## 2017-06-22 DIAGNOSIS — Z87891 Personal history of nicotine dependence: Secondary | ICD-10-CM | POA: Insufficient documentation

## 2017-06-22 LAB — COMPREHENSIVE METABOLIC PANEL
ALT: 19 U/L (ref 17–63)
ANION GAP: 6 (ref 5–15)
AST: 24 U/L (ref 15–41)
Albumin: 4.4 g/dL (ref 3.5–5.0)
Alkaline Phosphatase: 128 U/L — ABNORMAL HIGH (ref 38–126)
BUN: 10 mg/dL (ref 6–20)
CHLORIDE: 105 mmol/L (ref 101–111)
CO2: 26 mmol/L (ref 22–32)
CREATININE: 0.93 mg/dL (ref 0.61–1.24)
Calcium: 9.6 mg/dL (ref 8.9–10.3)
Glucose, Bld: 95 mg/dL (ref 65–99)
POTASSIUM: 4.1 mmol/L (ref 3.5–5.1)
SODIUM: 137 mmol/L (ref 135–145)
Total Bilirubin: 1.6 mg/dL — ABNORMAL HIGH (ref 0.3–1.2)
Total Protein: 8.2 g/dL — ABNORMAL HIGH (ref 6.5–8.1)

## 2017-06-22 LAB — CBC WITH DIFFERENTIAL/PLATELET
Basophils Absolute: 0 10*3/uL (ref 0.0–0.1)
Basophils Relative: 0 %
EOS ABS: 0.2 10*3/uL (ref 0.0–0.7)
Eosinophils Relative: 2 %
HCT: 31.2 % — ABNORMAL LOW (ref 39.0–52.0)
HEMOGLOBIN: 11.2 g/dL — AB (ref 13.0–17.0)
LYMPHS ABS: 1.7 10*3/uL (ref 0.7–4.0)
Lymphocytes Relative: 21 %
MCH: 27.9 pg (ref 26.0–34.0)
MCHC: 35.9 g/dL (ref 30.0–36.0)
MCV: 77.6 fL — ABNORMAL LOW (ref 78.0–100.0)
MONOS PCT: 13 %
Monocytes Absolute: 1 10*3/uL (ref 0.1–1.0)
NEUTROS ABS: 5.1 10*3/uL (ref 1.7–7.7)
NEUTROS PCT: 64 %
Platelets: 190 10*3/uL (ref 150–400)
RBC: 4.02 MIL/uL — AB (ref 4.22–5.81)
RDW: 18.3 % — ABNORMAL HIGH (ref 11.5–15.5)
WBC: 8 10*3/uL (ref 4.0–10.5)

## 2017-06-22 LAB — RETICULOCYTES
RBC.: 4.02 MIL/uL — AB (ref 4.22–5.81)
RETIC CT PCT: 6.4 % — AB (ref 0.4–3.1)
Retic Count, Absolute: 257.3 10*3/uL — ABNORMAL HIGH (ref 19.0–186.0)

## 2017-06-22 MED ORDER — DEXTROSE-NACL 5-0.45 % IV SOLN
INTRAVENOUS | Status: DC
  Administered 2017-06-22: 10:00:00 via INTRAVENOUS

## 2017-06-22 MED ORDER — KETOROLAC TROMETHAMINE 30 MG/ML IJ SOLN
30.0000 mg | Freq: Once | INTRAMUSCULAR | Status: AC
  Administered 2017-06-22: 30 mg via INTRAVENOUS
  Filled 2017-06-22: qty 1

## 2017-06-22 MED ORDER — DIPHENHYDRAMINE HCL 25 MG PO CAPS: 25.0000 mg | ORAL_CAPSULE | ORAL | Status: DC | PRN

## 2017-06-22 MED ORDER — HYDROMORPHONE 1 MG/ML IV SOLN
INTRAVENOUS | Status: DC
  Administered 2017-06-22: 30 mg via INTRAVENOUS
  Filled 2017-06-22: qty 30

## 2017-06-22 MED ORDER — SODIUM CHLORIDE 0.9 % IV SOLN
25.0000 mg | INTRAVENOUS | Status: DC | PRN
  Filled 2017-06-22: qty 0.5

## 2017-06-22 MED ORDER — SODIUM CHLORIDE 0.9% FLUSH: 9.0000 mL | INTRAVENOUS | Status: DC | PRN

## 2017-06-22 MED ORDER — ONDANSETRON HCL 4 MG/2ML IJ SOLN: 4.0000 mg | Freq: Four times a day (QID) | INTRAMUSCULAR | Status: DC | PRN

## 2017-06-22 MED ORDER — NALOXONE HCL 0.4 MG/ML IJ SOLN: 0.4000 mg | INTRAMUSCULAR | Status: DC | PRN

## 2017-06-22 NOTE — Discharge Instructions (Signed)
Sickle Cell Anemia, Adult °Sickle cell anemia is a condition where your red blood cells are shaped like sickles. Red blood cells carry oxygen through the body. Sickle-shaped red blood cells do not live as long as normal red blood cells. They also clump together and block blood from flowing through the blood vessels. These things prevent the body from getting enough oxygen. Sickle cell anemia causes organ damage and pain. It also increases the risk of infection. °Follow these instructions at home: °· Drink enough fluid to keep your pee (urine) clear or pale yellow. Drink more in hot weather and during exercise. °· Do not smoke. Smoking lowers oxygen levels in the blood. °· Only take over-the-counter or prescription medicines as told by your doctor. °· Take antibiotic medicines as told by your doctor. Make sure you finish them even if you start to feel better. °· Take supplements as told by your doctor. °· Consider wearing a medical alert bracelet. This tells anyone caring for you in an emergency of your condition. °· When traveling, keep your medical information, doctors' names, and the medicines you take with you at all times. °· If you have a fever, do not take fever medicines right away. This could cover up a problem. Tell your doctor. °· Keep all follow-up visits with your doctor. Sickle cell anemia requires regular medical care. °Contact a doctor if: °You have a fever. °Get help right away if: °· You feel dizzy or faint. °· You have new belly (abdominal) pain, especially on the left side near the stomach area. °· You have a lasting, often uncomfortable and painful erection of the penis (priapism). If it is not treated right away, you will become unable to have sex (impotence). °· You have numbness in your arms or legs or you have a hard time moving them. °· You have a hard time talking. °· You have a fever or lasting symptoms for more than 2-3 days. °· You have a fever and your symptoms suddenly get  worse. °· You have signs or symptoms of infection. These include: °? Chills. °? Being more tired than normal (lethargy). °? Irritability. °? Poor eating. °? Throwing up (vomiting). °· You have pain that is not helped with medicine. °· You have shortness of breath. °· You have pain in your chest. °· You are coughing up pus-like or bloody mucus. °· You have a stiff neck. °· Your feet or hands swell or have pain. °· Your belly looks bloated. °· Your joints hurt. °This information is not intended to replace advice given to you by your health care provider. Make sure you discuss any questions you have with your health care provider. °Document Released: 03/21/2013 Document Revised: 11/06/2015 Document Reviewed: 01/10/2013 °Elsevier Interactive Patient Education © 2017 Elsevier Inc. ° °

## 2017-06-22 NOTE — H&P (Signed)
[facility] History and Physical   Date: 06/22/2017  Patient name: William Jennings Medical record number: [id number] Date of birth: August 30, 1990 Age: 27 y.o. Gender: male PCP: Massie Maroon, FNP  Attending physician: Quentin Angst, MD  Chief Complaint: Bilateral leg pain   History of Present Illness:  William Jennings is a 27 y.o. male with a diagnosis of Sickle Cell Anemia, presents today with a complaint of bilateral leg pain x 3 days. Current pain intensity 9/10 and he characterizes current pain as persistent aching. He has taken his pain medication around the clock with only mild temporary relief of pain. William Jennings reports this pain episode is consistent with his typical sickle cell pain. Baseline hemoglobin ranges from 10-11. William Jennings at present, denies headache, fever, shortness of breath, chest pain, dysuria, nausea, vomiting, or diarrhea.  Patient is will be admitted to the day infusion center for extended observation and pain management.  Meds: Medications Prior to Admission  Medication Sig Dispense Refill Last Dose  . ibuprofen (ADVIL,MOTRIN) 600 MG tablet Take 1 tablet (600 mg total) by mouth every 8 (eight) hours as needed. 30 tablet 2 06/21/2017 at Unknown time  . oxyCODONE-acetaminophen (PERCOCET/ROXICET) 5-325 MG tablet Take 1 tablet by mouth every 6 (six) hours as needed. 60 tablet 0 06/22/2017 at 0800  . folic acid (FOLVITE) 1 MG tablet Take 1 tablet (1 mg total) by mouth daily. (Patient not taking: Reported on 05/25/2017) 30 tablet 11 Not Taking  . oxyCODONE (OXY IR/ROXICODONE) 5 MG immediate release tablet Take 1 tablet (5 mg total) by mouth every 6 (six) hours as needed for severe pain. 30 tablet 0 06/11/2017 at Unknown time    Allergies: Shellfish allergy Past Medical History:  Diagnosis Date  . Sickle cell anemia (HCC)   . Sickle cell disease (HCC)    Past Surgical History:  Procedure Laterality Date  . CHOLECYSTECTOMY     History reviewed. No  pertinent family history. Social History   Socioeconomic History  . Marital status: Single    Spouse name: Not on file  . Number of children: Not on file  . Years of education: Not on file  . Highest education level: Not on file  Social Needs  . Financial resource strain: Not on file  . Food insecurity - worry: Not on file  . Food insecurity - inability: Not on file  . Transportation needs - medical: Not on file  . Transportation needs - non-medical: Not on file  Occupational History  . Not on file  Tobacco Use  . Smoking status: Former Games developer  . Smokeless tobacco: Never Used  Substance and Sexual Activity  . Alcohol use: Yes    Comment: soc  . Drug use: No  . Sexual activity: Yes  Other Topics Concern  . Not on file  Social History Narrative  . Not on file    Review of Systems: Constitutional: negative Cardiovascular: negative Respiratory: negative Gastrointestinal: negative Musculoskeletal:positive for arthralgias Neurological: negative  Physical Exam: Blood pressure 121/77, pulse 71, temperature 98.3 F (36.8 C), resp. rate 16, SpO2 100 %.  General Appearance:    Alert, cooperative, no distress, appears stated age  Head:    Normocephalic, without obvious abnormality, atraumatic  Eyes:    PERRL, conjunctiva/corneas clear, EOM's intact  Back:     Symmetric, no curvature, ROM normal, no CVA tenderness  Lungs:     Clear to auscultation bilaterally, respirations unlabored  Chest Wall:    No tenderness or deformity  Heart:    Regular rate and rhythm, S1 and S2 normal, no murmur, rub   or gallop  Abdomen:     Soft, non-tender, bowel sounds active all four quadrants,    no masses, no organomegaly  Extremities:   Extremities normal, atraumatic, no cyanosis or edema  Neurologic:   Normal strength, sensation   Lab results: Pending   Imaging results:  N/A  Assessment & Plan:  Patient will be admitted to the day infusion center for extended observation  Start IV  D5.45 for cellular rehydration at 125/hr  Start Toradol 30 mg IV every 6 hours for inflammation.  Start Dilaudid PCA High Concentration per weight based protocol.   Patient will be re-evaluated for pain intensity in the context of function and relationship to baseline as care progresses.  If no significant pain relief, will transfer patient to inpatient services for a higher level of care.   Will check CMP, CBC w/differential, Reticulocyte count   Joaquin CourtsKimberly Minaal Struckman 06/22/2017, 9:25 AM

## 2017-06-22 NOTE — Discharge Summary (Signed)
[facility] Discharge Summary   Patient ID: William Jennings MRN: [id number] DOB/AGE: [birth year] 26 y.o.  Admit date: 06/22/2017 Discharge date: 06/22/2017  Primary Care Physician:  Massie Maroon, FNP  Admission Diagnoses:  Active Problems:   Sickle-cell/Hb-C disease with crisis [facility])   Discharge Diagnoses:     Sickle-cell/Hb-C disease with crisis [facility])  Discharge Medications: Allergies as of 06/22/2017      Reactions   Shellfish Allergy Anaphylaxis      Medication List    TAKE these medications   folic acid 1 MG tablet Commonly known as:  FOLVITE Take 1 tablet (1 mg total) by mouth daily.   ibuprofen 600 MG tablet Commonly known as:  ADVIL,MOTRIN Take 1 tablet (600 mg total) by mouth every 8 (eight) hours as needed.   oxyCODONE 5 MG immediate release tablet Commonly known as:  Oxy IR/ROXICODONE Take 1 tablet (5 mg total) by mouth every 6 (six) hours as needed for severe pain.   oxyCODONE-acetaminophen 5-325 MG tablet Commonly known as:  PERCOCET/ROXICET Take 1 tablet by mouth every 6 (six) hours as needed.      Consults:  N/A  Significant Diagnostic Studies:  Results for orders placed or performed during the hospital encounter of 06/22/17  Comprehensive metabolic panel  Result Value Ref Range   Sodium 137 135 - 145 mmol/L   Potassium 4.1 3.5 - 5.1 mmol/L   Chloride 105 101 - 111 mmol/L   CO2 26 22 - 32 mmol/L   Glucose, Bld 95 65 - 99 mg/dL   BUN 10 6 - 20 mg/dL   Creatinine, Ser 4.09 0.61 - 1.24 mg/dL   Calcium 9.6 8.9 - 81.1 mg/dL   Total Protein 8.2 (H) 6.5 - 8.1 g/dL   Albumin 4.4 3.5 - 5.0 g/dL   AST 24 15 - 41 U/L   ALT 19 17 - 63 U/L   Alkaline Phosphatase 128 (H) 38 - 126 U/L   Total Bilirubin 1.6 (H) 0.3 - 1.2 mg/dL   GFR calc non Af Amer >60 >60 mL/min   GFR calc Af Amer >60 >60 mL/min   Anion gap 6 5 - 15  CBC with Differential/Platelet  Result Value Ref Range   WBC 8.0 4.0 - 10.5 K/uL   RBC 4.02 (L) 4.22 - 5.81 MIL/uL   Hemoglobin 11.2 (L) 13.0 - 17.0 g/dL   HCT 91.4 (L) 78.2 - 95.6 %   MCV 77.6 (L) 78.0 - 100.0 fL   MCH 27.9 26.0 - 34.0 pg   MCHC 35.9 30.0 - 36.0 g/dL   RDW 21.3 (H) 08.6 - 57.8 %   Platelets 190 150 - 400 K/uL   Neutrophils Relative % 64 %   Neutro Abs 5.1 1.7 - 7.7 K/uL   Lymphocytes Relative 21 %   Lymphs Abs 1.7 0.7 - 4.0 K/uL   Monocytes Relative 13 %   Monocytes Absolute 1.0 0.1 - 1.0 K/uL   Eosinophils Relative 2 %   Eosinophils Absolute 0.2 0.0 - 0.7 K/uL   Basophils Relative 0 %   Basophils Absolute 0.0 0.0 - 0.1 K/uL  Reticulocytes  Result Value Ref Range   Retic Ct Pct 6.4 (H) 0.4 - 3.1 %   RBC. 4.02 (L) 4.22 - 5.81 MIL/uL   Retic Count, Absolute 257.3 (H) 19.0 - 186.0 K/uL   [facility] Course: William Jennings is a 27 y.o. male with a diagnosis of Sickle Cell Anemia, presented today with a complaint of  bilateral leg pain x 3 days. Current pain intensity 9/10 and he characterizes current pain as persistent aching. He has taken his pain medication around the clock with only mild temporary relief of pain. William Jennings reports this pain episode is consistent with his typical sickle cell pain. Baseline hemoglobin ranges from 10-11. William Jennings at present, denies headache, fever, shortness of breath, chest pain, dysuria, nausea, vomiting, or diarrhea.  Patient was admitted to the day infusion center for extended observation and pain management. The following was the course of treatment administered: Intravenous D5.45 @ 125 cc/hr administer for cellular rehydration. Toradol 30 mg intravenously for inflammation reduction.Placed on a High Concentration PCA per weight based protocol for pain control. Patient goal for self management achieved per patient. Hydromorphone total delivered 5.5 mg. Current pain intensity 4/10  improved from admission pain intensity of 9/10.Reviewed laboratory values, consistent with baseline. Patient is alert, oriented and ambulatory.   Physical Exam at  Discharge: BP 104/60 (BP Location: Right Arm)   Pulse 85   Temp 97.8 F (36.6 C)   Resp (!) 9   SpO2 98%  General Appearance:    Alert, cooperative, no distress, appears stated age  Head:    Normocephalic, without obvious abnormality, atraumatic  Eyes:    PERRL, conjunctiva/corneas clear, EOM's intact  Back:     Symmetric, no curvature, ROM normal, no CVA tenderness  Lungs:     Clear to auscultation bilaterally, respirations unlabored  Chest Wall:    No tenderness or deformity   Heart:    Regular rate and rhythm, S1 and S2 normal, no murmur, rub   or gallop  Abdomen:     Soft, non-tender, bowel sounds active all four quadrants,    no masses, no organomegaly  Extremities:   Extremities normal, atraumatic, no cyanosis or edema  Neurologic:   Normal strength, sensation   Disposition at Discharge: 01-Home or Self Care  Discharge Orders: -Continue to hydrate and take prescribed home medications as ordered. -Resume all home medications. -Keep upcoming appointment  -The patient was given clear instructions to go to ER or return to medical center if symptoms do not improve, worsen or new problems develop. The patient verbalized understanding.  Condition at Discharge:   Stable  Time spent on Discharge:  Greater than 30 minutes.  Signed: Joaquin CourtsKimberly Davell Beckstead 06/22/2017, 4:00 PM

## 2017-06-22 NOTE — Progress Notes (Signed)
Pt treated for sickle cell crisis pain [facility]/10 [facility]. Tolerated treatment well. Pain at discharge 4/10. Pt alert and ambulatory at discharge.  Ardyth GalAnderson, Rayan Dyal Ann, RN 06/22/2017

## 2017-06-22 NOTE — Telephone Encounter (Signed)
Received call from a messaging service on behalf of patient. Called pt back and he would like to be treated for sickle cell crisis. C/o 9/10 pain to both arms and R leg lasting 3 days. He has been drinking plenty of water and taking his Oxycodone around the clock as ordered with no relief. Message given to Jerrilyn CairoKim Harris, NP. Will call patient back with instructions.  Ardyth GalAnderson, Cecile Gillispie Ann, RN 06/22/2017

## 2017-06-22 NOTE — Telephone Encounter (Signed)
Pt calling back to find out whether he can be treated in day hospital. Jerrilyn CairoKim Harris, NP, advised that pt may come for treatment today. Pt notified, he states he is on his way now.  Ardyth GalAnderson, Kennedie Pardoe Ann, RN 06/22/2017

## 2017-07-14 ENCOUNTER — Emergency Department (HOSPITAL_BASED_OUTPATIENT_CLINIC_OR_DEPARTMENT_OTHER)
Admission: EM | Admit: 2017-07-14 | Discharge: 2017-07-14 | Disposition: A | Discharge status: Home / Self Care | Payer: 59 | Attending: Physician Assistant | Admitting: Physician Assistant

## 2017-07-14 ENCOUNTER — Encounter (HOSPITAL_BASED_OUTPATIENT_CLINIC_OR_DEPARTMENT_OTHER): Payer: Self-pay | Admitting: *Deleted

## 2017-07-14 ENCOUNTER — Other Ambulatory Visit: Payer: Self-pay

## 2017-07-14 DIAGNOSIS — Z79899 Other long term (current) drug therapy: Secondary | ICD-10-CM | POA: Diagnosis not present

## 2017-07-14 DIAGNOSIS — Z87891 Personal history of nicotine dependence: Secondary | ICD-10-CM | POA: Insufficient documentation

## 2017-07-14 DIAGNOSIS — D5700 Hb-SS disease with crisis, unspecified: Secondary | ICD-10-CM | POA: Diagnosis not present

## 2017-07-14 DIAGNOSIS — M79604 Pain in right leg: Secondary | ICD-10-CM | POA: Diagnosis present

## 2017-07-14 LAB — RETICULOCYTES
RBC.: 4.39 MIL/uL (ref 4.22–5.81)
Retic Count, Absolute: 201.9 10*3/uL — ABNORMAL HIGH (ref 19.0–186.0)
Retic Ct Pct: 4.6 % — ABNORMAL HIGH (ref 0.4–3.1)

## 2017-07-14 LAB — CBC WITH DIFFERENTIAL/PLATELET
Basophils Absolute: 0 10*3/uL (ref 0.0–0.1)
Basophils Relative: 0 %
Eosinophils Absolute: 0.1 10*3/uL (ref 0.0–0.7)
Eosinophils Relative: 1 %
HCT: 32.9 % — ABNORMAL LOW (ref 39.0–52.0)
Hemoglobin: 12.1 g/dL — ABNORMAL LOW (ref 13.0–17.0)
Lymphocytes Relative: 43 %
Lymphs Abs: 3.4 10*3/uL (ref 0.7–4.0)
MCH: 28 pg (ref 26.0–34.0)
MCHC: 36.8 g/dL — ABNORMAL HIGH (ref 30.0–36.0)
MCV: 76.2 fL — ABNORMAL LOW (ref 78.0–100.0)
Monocytes Absolute: 0.7 10*3/uL (ref 0.1–1.0)
Monocytes Relative: 9 %
Neutro Abs: 3.6 10*3/uL (ref 1.7–7.7)
Neutrophils Relative %: 47 %
Platelets: 126 10*3/uL — ABNORMAL LOW (ref 150–400)
RBC: 4.32 MIL/uL (ref 4.22–5.81)
RDW: 17.2 % — ABNORMAL HIGH (ref 11.5–15.5)
WBC: 7.9 10*3/uL (ref 4.0–10.5)

## 2017-07-14 LAB — BASIC METABOLIC PANEL
Anion gap: 6 (ref 5–15)
BUN: 9 mg/dL (ref 6–20)
CO2: 23 mmol/L (ref 22–32)
Calcium: 8.8 mg/dL — ABNORMAL LOW (ref 8.9–10.3)
Chloride: 108 mmol/L (ref 101–111)
Creatinine, Ser: 0.9 mg/dL (ref 0.61–1.24)
GFR calc Af Amer: >60 mL/min (ref >60)
GFR calc non Af Amer: >60 mL/min (ref >60)
Glucose, Bld: 94 mg/dL (ref 65–99)
Potassium: 3.9 mmol/L (ref 3.5–5.1)
Sodium: 137 mmol/L (ref 135–145)

## 2017-07-14 MED ORDER — KETOROLAC TROMETHAMINE 30 MG/ML IJ SOLN
30.0000 mg | INTRAMUSCULAR | Status: AC
  Administered 2017-07-14: 30 mg via INTRAVENOUS
  Filled 2017-07-14: qty 1

## 2017-07-14 MED ORDER — DIPHENHYDRAMINE HCL 25 MG PO CAPS: 25.0000 mg | ORAL_CAPSULE | ORAL | Status: DC | PRN

## 2017-07-14 MED ORDER — SODIUM CHLORIDE 0.45 % IV SOLN
INTRAVENOUS | Status: DC
  Administered 2017-07-14: 16:00:00 via INTRAVENOUS

## 2017-07-14 MED ORDER — HYDROMORPHONE HCL 1 MG/ML IJ SOLN
1.0000 mg | INTRAMUSCULAR | Status: AC
  Administered 2017-07-14: 1 mg via INTRAVENOUS
  Filled 2017-07-14: qty 1

## 2017-07-14 MED ORDER — HYDROMORPHONE HCL 1 MG/ML IJ SOLN: 1.0000 mg | INTRAMUSCULAR | Status: DC

## 2017-07-14 MED ORDER — HYDROMORPHONE HCL 1 MG/ML IJ SOLN: 1.0000 mg | INTRAMUSCULAR | Status: AC

## 2017-07-14 MED ORDER — HYDROMORPHONE HCL 1 MG/ML IJ SOLN
0.5000 mg | INTRAMUSCULAR | Status: AC
  Administered 2017-07-14: 0.5 mg via INTRAVENOUS
  Filled 2017-07-14: qty 1

## 2017-07-14 MED ORDER — HYDROMORPHONE HCL 1 MG/ML IJ SOLN: 0.5000 mg | INTRAMUSCULAR | Status: AC

## 2017-07-14 NOTE — ED Notes (Signed)
ED Provider at bedside. 

## 2017-07-14 NOTE — ED Triage Notes (Addendum)
Pt c/o right leg pain x 1 day  " sickle cell crisis" pt did not call sickle cell clinic

## 2017-07-14 NOTE — ED Notes (Signed)
Pt verbalizes understanding of d/c instructions and denies any further needs at this time. 

## 2017-07-14 NOTE — ED Notes (Signed)
Pt on cell phone/Facebook, does not physically appear to be in any pain at this time

## 2017-07-14 NOTE — ED Provider Notes (Signed)
[facility] EMERGENCY DEPARTMENT Provider Note   CSN: [id number] Arrival date & time: 07/14/17  1433     History   Chief Complaint Chief Complaint  Patient presents with  . Leg Pain    HPI William Jennings is a 27 y.o. male with history of sickle cell disease who presents with a 2-day history of right leg pain.  He reports his whole right leg hurts, however it is worse in his right shin and from his knee down.  He states this is typical of his normal flareups.  He has been getting more crises over the past couple months.  He believes it is due to the cold.  The past few flareups have been in his legs.  He denies any numbness or tingling.  He denies any new injury.  He also denies any chest pain, shortness of breath, fever, abdominal pain, nausea, vomiting, urinary symptoms.  He has been taking his oxycodone 5 mg every 6 hours without relief.  He states he only takes this medication when he is in pain.  He is a patient at the sickle cell clinic, however he states that the clinic cannot accept patients after noon each day.  HPI  Past Medical History:  Diagnosis Date  . Sickle cell anemia (HCC)   . Sickle cell disease [facility])     Patient Active Problem List   Diagnosis Date Noted  . Sickle cell anemia with pain (HCC) 04/05/2016  . Acute chest syndrome due to sickle cell crisis (HCC)   . Sickle cell anemia with crisis (HCC) 02/21/2014  . Sickle cell anemia (HCC) 08/10/2013  . Chest pain 08/10/2013  . Dehydration 08/08/2013  . Sickle cell crisis (HCC) 08/06/2013  . 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 CHOLECYSTECTOMY         Home Medications    Prior to Admission medications   Medication Sig Start Date End Date Taking? Authorizing Provider  folic acid (FOLVITE) 1 MG tablet Take 1 tablet (1 mg total) by mouth daily. Patient not taking: Reported on  05/25/2017 11/26/16 11/26/17  Massie Maroon, FNP  ibuprofen (ADVIL,MOTRIN) 600 MG tablet Take 1 tablet (600 mg total) by mouth every 8 (eight) hours as needed. 11/26/16   Massie Maroon, FNP  oxyCODONE (OXY IR/ROXICODONE) 5 MG immediate release tablet Take 1 tablet (5 mg total) by mouth every 6 (six) hours as needed for severe pain. 11/26/16   Massie Maroon, FNP  oxyCODONE-acetaminophen (PERCOCET/ROXICET) 5-325 MG tablet Take 1 tablet by mouth every 6 (six) hours as needed. 05/25/17   Massie Maroon, FNP    Family History History reviewed. No pertinent family history.  Social History Social History   Tobacco Use  . Smoking status: Former Games developer  . Smokeless tobacco: Never Used  Substance Use Topics  . Alcohol use: Yes    Comment: soc  . Drug use: No     Allergies   Shellfish allergy   Review of Systems Review of Systems  Constitutional: Negative for chills and fever.  HENT: Negative for facial swelling and sore throat.   Respiratory: Negative for shortness of breath.   Cardiovascular: Negative for chest pain.  Gastrointestinal: Negative for abdominal pain, nausea and vomiting.  Genitourinary: Negative for dysuria.  Musculoskeletal: Positive for arthralgias and myalgias. Negative for back pain.  Skin: Negative for rash and wound.  Neurological: Negative  for headaches.  Psychiatric/Behavioral: The patient is not nervous/anxious.      Physical Exam Updated Vital Signs BP 120/71   Pulse 90   Temp 98.3 F (36.8 C)   Resp 16   Ht 5\' 8"  (1.727 m)   Wt 65.8 kg (145 lb)   SpO2 99%   BMI 22.05 kg/m   Physical Exam  Constitutional: He appears well-developed and well-nourished. No distress.  HENT:  Head: Normocephalic and atraumatic.  Mouth/Throat: Oropharynx is clear and moist. No oropharyngeal exudate.  Eyes: Conjunctivae are normal. Pupils are equal, round, and reactive to light. Right eye exhibits no discharge. Left eye exhibits no discharge. No scleral  icterus.  Neck: Normal range of motion. Neck supple. No thyromegaly present.  Cardiovascular: Normal rate, regular rhythm, normal heart sounds and intact distal pulses. Exam reveals no gallop and no friction rub.  No murmur heard. Pulmonary/Chest: Effort normal and breath sounds normal. No stridor. No respiratory distress. He has no wheezes. He has no rales. He exhibits no tenderness.  Abdominal: Soft. Bowel sounds are normal. He exhibits no distension. There is no tenderness. There is no rebound and no guarding.  Musculoskeletal: He exhibits no edema.       Legs: Lymphadenopathy:    He has no cervical adenopathy.  Neurological: He is alert. Coordination normal.  Skin: Skin is warm and dry. No rash noted. He is not diaphoretic. No pallor.  Psychiatric: He has a normal mood and affect.  Nursing note and vitals reviewed.    ED Treatments / Results  Labs (all labs ordered are listed, but only abnormal results are displayed) Labs Reviewed  CBC WITH DIFFERENTIAL/PLATELET - Abnormal; Notable for the following components:      Result Value   Hemoglobin 12.1 (*)    HCT 32.9 (*)    MCV 76.2 (*)    MCHC 36.8 (*)    RDW 17.2 (*)    Platelets 126 (*)    All other components within normal limits  BASIC METABOLIC PANEL - Abnormal; Notable for the following components:   Calcium 8.8 (*)    All other components within normal limits  RETICULOCYTES - Abnormal; Notable for the following components:   Retic Ct Pct 4.6 (*)    Retic Count, Absolute 201.9 (*)    All other components within normal limits    EKG  EKG Interpretation None       Radiology No results found.  Procedures Procedures (including critical care time)  Medications Ordered in ED Medications  0.45 % sodium chloride infusion ( Intravenous New Bag/Given 07/14/17 1627)  HYDROmorphone (DILAUDID) injection 1 mg (not administered)    Or  HYDROmorphone (DILAUDID) injection 1 mg (not administered)  diphenhydrAMINE  (BENADRYL) capsule 25-50 mg (not administered)  ketorolac (TORADOL) 30 MG/ML injection 30 mg (30 mg Intravenous Given 07/14/17 1628)  HYDROmorphone (DILAUDID) injection 0.5 mg (0.5 mg Intravenous Given 07/14/17 1628)    Or  HYDROmorphone (DILAUDID) injection 0.5 mg ( Subcutaneous See Alternative 07/14/17 1628)  HYDROmorphone (DILAUDID) injection 1 mg (1 mg Intravenous Given 07/14/17 1700)    Or  HYDROmorphone (DILAUDID) injection 1 mg ( Subcutaneous See Alternative 07/14/17 1700)  HYDROmorphone (DILAUDID) injection 1 mg (1 mg Intravenous Given 07/14/17 1758)    Or  HYDROmorphone (DILAUDID) injection 1 mg ( Subcutaneous See Alternative 07/14/17 1758)     Initial Impression / Assessment and Plan / ED Course  I have reviewed the triage vital signs and the nursing notes.  Pertinent labs & imaging  results that were available during my care of the patient were reviewed by me and considered in my medical decision making (see chart for details).     Patient presenting with sickle cell crisis typical of his normal flareups.  No chest pain or shortness of breath or concern for acute chest syndrome.  Labs are stable from past, retic Ct 4.6, absolute 201.9.  Hemoglobin 12.1.  Patient is feeling much better after opioid nave sickle cell pain protocol.  Patient advised to follow-up with the sickle cell clinic for further management of his crisis.  Return precautions discussed.  Patient understands and agrees with plan.  Patient vitals stable throughout ED course and discharged in satisfactory condition.  Final Clinical Impressions(s) / ED Diagnoses   Final diagnoses:  Sickle cell crisis [facility](HCC)    ED Discharge Orders    None       Emi HolesLaw, Amonie Wisser M, PA-C 07/14/17 1858    Abelino DerrickMackuen, Courteney Lyn, MD 07/14/17 2310

## 2017-07-14 NOTE — Discharge Instructions (Signed)
Please follow-up with the sickle cell clinic for further management of your crisis.  Please return to emergency department if you develop any new or worsening symptoms.

## 2017-07-27 ENCOUNTER — Ambulatory Visit: Payer: Medicaid Other | Admitting: Family Medicine

## 2017-08-16 ENCOUNTER — Other Ambulatory Visit: Payer: Self-pay

## 2017-08-16 ENCOUNTER — Emergency Department (HOSPITAL_BASED_OUTPATIENT_CLINIC_OR_DEPARTMENT_OTHER)
Admission: EM | Admit: 2017-08-16 | Discharge: 2017-08-17 | Disposition: A | Discharge status: Home / Self Care | Payer: 59 | Attending: Emergency Medicine | Admitting: Emergency Medicine

## 2017-08-16 ENCOUNTER — Encounter (HOSPITAL_BASED_OUTPATIENT_CLINIC_OR_DEPARTMENT_OTHER): Payer: Self-pay

## 2017-08-16 DIAGNOSIS — Z79899 Other long term (current) drug therapy: Secondary | ICD-10-CM | POA: Insufficient documentation

## 2017-08-16 DIAGNOSIS — D5700 Hb-SS disease with crisis, unspecified: Secondary | ICD-10-CM | POA: Diagnosis not present

## 2017-08-16 DIAGNOSIS — M79602 Pain in left arm: Secondary | ICD-10-CM | POA: Diagnosis not present

## 2017-08-16 DIAGNOSIS — M79604 Pain in right leg: Secondary | ICD-10-CM | POA: Diagnosis present

## 2017-08-16 DIAGNOSIS — Z87891 Personal history of nicotine dependence: Secondary | ICD-10-CM | POA: Insufficient documentation

## 2017-08-16 DIAGNOSIS — M79605 Pain in left leg: Secondary | ICD-10-CM | POA: Diagnosis not present

## 2017-08-16 DIAGNOSIS — R110 Nausea: Secondary | ICD-10-CM | POA: Diagnosis not present

## 2017-08-16 DIAGNOSIS — M79601 Pain in right arm: Secondary | ICD-10-CM | POA: Diagnosis not present

## 2017-08-16 HISTORY — DX: Long term (current) use of opiate analgesic: Z79.891

## 2017-08-16 LAB — COMPREHENSIVE METABOLIC PANEL
ALBUMIN: 4.4 g/dL (ref 3.5–5.0)
ALT: 26 U/L (ref 17–63)
ANION GAP: 7 (ref 5–15)
AST: 38 U/L (ref 15–41)
Alkaline Phosphatase: 91 U/L (ref 38–126)
BUN: 9 mg/dL (ref 6–20)
CHLORIDE: 105 mmol/L (ref 101–111)
CO2: 25 mmol/L (ref 22–32)
Calcium: 8.9 mg/dL (ref 8.9–10.3)
Creatinine, Ser: 0.9 mg/dL (ref 0.61–1.24)
GFR calc Af Amer: >60 mL/min (ref >60)
GFR calc non Af Amer: >60 mL/min (ref >60)
GLUCOSE: 112 mg/dL — AB (ref 65–99)
POTASSIUM: 3.7 mmol/L (ref 3.5–5.1)
Sodium: 137 mmol/L (ref 135–145)
TOTAL PROTEIN: 8 g/dL (ref 6.5–8.1)
Total Bilirubin: 1.3 mg/dL — ABNORMAL HIGH (ref 0.3–1.2)

## 2017-08-16 MED ORDER — HYDROMORPHONE HCL 1 MG/ML IJ SOLN
2.0000 mg | INTRAMUSCULAR | Status: AC
  Administered 2017-08-17: 2 mg via INTRAVENOUS
  Filled 2017-08-16: qty 2

## 2017-08-16 MED ORDER — HYDROMORPHONE HCL 1 MG/ML IJ SOLN
2.0000 mg | INTRAMUSCULAR | Status: AC
  Administered 2017-08-16: 2 mg via INTRAVENOUS
  Filled 2017-08-16: qty 2

## 2017-08-16 MED ORDER — HYDROMORPHONE HCL 1 MG/ML IJ SOLN: 2.0000 mg | INTRAMUSCULAR | Status: AC

## 2017-08-16 MED ORDER — SODIUM CHLORIDE 0.45 % IV SOLN
INTRAVENOUS | Status: DC
  Administered 2017-08-16: 23:00:00 via INTRAVENOUS

## 2017-08-16 MED ORDER — ONDANSETRON HCL 4 MG/2ML IJ SOLN
4.0000 mg | INTRAMUSCULAR | Status: DC | PRN
  Administered 2017-08-16: 4 mg via INTRAVENOUS
  Filled 2017-08-16: qty 2

## 2017-08-16 MED ORDER — KETOROLAC TROMETHAMINE 30 MG/ML IJ SOLN
30.0000 mg | INTRAMUSCULAR | Status: AC
  Administered 2017-08-16: 30 mg via INTRAVENOUS
  Filled 2017-08-16: qty 1

## 2017-08-16 NOTE — ED Notes (Signed)
Bilateral leg and right arm pain worsening for the last two days, typical of his sickle cell pain.  Pt denies any chest pain, and denies fever.

## 2017-08-16 NOTE — ED Triage Notes (Signed)
C/o SSC-pain to bilat legs and right arm-NAD-steady gait

## 2017-08-16 NOTE — ED Provider Notes (Signed)
TIME SEEN: 11:03 PM  CHIEF COMPLAINT: Sickle cell pain  HPI: Patient is a 27 year old male with history of sickle cell disease who presents emergency department with 2 days of bilateral leg and arm pain.  No chest pain or shortness of breath.  No fever or cough.  No vomiting or diarrhea.  No rash or swelling noted to his extremities.  No numbness or tingling.  ROS: See HPI Constitutional: no fever  Eyes: no drainage  ENT: no runny nose   Cardiovascular:  no chest pain  Resp: no SOB  GI: no vomiting GU: no dysuria Integumentary: no rash  Allergy: no hives  Musculoskeletal: no leg swelling  Neurological: no slurred speech ROS otherwise negative  PAST MEDICAL HISTORY/PAST SURGICAL HISTORY:  Past Medical History:  Diagnosis Date  . Sickle cell anemia (HCC)   . Sickle cell disease (HCC)     MEDICATIONS:  Prior to Admission medications   Medication Sig Start Date End Date Taking? Authorizing Provider  folic acid (FOLVITE) 1 MG tablet Take 1 tablet (1 mg total) by mouth daily. Patient not taking: Reported on 05/25/2017 11/26/16 11/26/17  Massie MaroonHollis, Lachina M, FNP    ALLERGIES:  Allergies  Allergen Reactions  . Shellfish Allergy Anaphylaxis    SOCIAL HISTORY:  Social History   Tobacco Use  . Smoking status: Former Games developermoker  . Smokeless tobacco: Never Used  Substance Use Topics  . Alcohol use: Yes    Comment: occ    FAMILY HISTORY: No family history on file.  EXAM: BP 129/86   Pulse 89   Temp 98.2 F (36.8 C) (Oral)   Resp 20   Ht 5\' 8"  (1.727 m)   Wt 65.8 kg (145 lb)   SpO2 100%   BMI 22.05 kg/m  CONSTITUTIONAL: Alert and oriented and responds appropriately to questions. Well-appearing; well-nourished HEAD: Normocephalic EYES: Conjunctivae clear, pupils appear equal, EOMI ENT: normal nose; moist mucous membranes NECK: Supple, no meningismus, no nuchal rigidity, no LAD  CARD: RRR; S1 and S2 appreciated; no murmurs, no clicks, no rubs, no gallops RESP: Normal  chest excursion without splinting or tachypnea; breath sounds clear and equal bilaterally; no wheezes, no rhonchi, no rales, no hypoxia or respiratory distress, speaking full sentences ABD/GI: Normal bowel sounds; non-distended; soft, non-tender, no rebound, no guarding, no peritoneal signs, no hepatosplenomegaly BACK:  The back appears normal and is non-tender to palpation, there is no CVA tenderness EXT: Normal ROM in all joints; non-tender to palpation; no edema; normal capillary refill; no cyanosis, no calf tenderness or swelling, 2+ DP and radial pulses bilaterally, no redness or warmth to his extremities, compartments are soft SKIN: Normal color for age and race; warm; no rash NEURO: Moves all extremities equally, normal sensation diffusely PSYCH: The patient's mood and manner are appropriate. Grooming and personal hygiene are appropriate.  MEDICAL DECISION MAKING: Patient here with sickle cell pain.  Hemodynamically stable.  Neurovascular intact distally.  Will obtain labs.  Will give IV fluids, pain and nausea medicine.  ED PROGRESS: Patient's labs are reassuring.  Hemoglobin of 12.  He reports feeling much better and feels he will be able to go home after a fourth dose of Dilaudid.  He has pain medication at home and a PCP for follow-up.  In no distress, nontoxic appearing.  At this time, I do not feel there is any life-threatening condition present. I have reviewed and discussed all results (EKG, imaging, lab, urine as appropriate) and exam findings with patient/family. I have reviewed nursing  notes and appropriate previous records.  I feel the patient is safe to be discharged home without further emergent workup and can continue workup as an outpatient as needed. Discussed usual and customary return precautions. Patient/family verbalize understanding and are comfortable with this plan.  Outpatient follow-up has been provided if needed. All questions have been answered.      Neeta Storey, Layla Maw, DO 08/17/17 [phone]

## 2017-08-17 ENCOUNTER — Encounter (HOSPITAL_BASED_OUTPATIENT_CLINIC_OR_DEPARTMENT_OTHER): Payer: Self-pay | Admitting: Emergency Medicine

## 2017-08-17 LAB — CBC WITH DIFFERENTIAL/PLATELET
BASOS ABS: 0 10*3/uL (ref 0.0–0.1)
Basophils Relative: 0 %
EOS PCT: 2 %
Eosinophils Absolute: 0.2 10*3/uL (ref 0.0–0.7)
HEMATOCRIT: 33 % — AB (ref 39.0–52.0)
HEMOGLOBIN: 12.1 g/dL — AB (ref 13.0–17.0)
LYMPHS ABS: 4.4 10*3/uL — AB (ref 0.7–4.0)
LYMPHS PCT: 48 %
MCH: 28.4 pg (ref 26.0–34.0)
MCHC: 36.7 g/dL — ABNORMAL HIGH (ref 30.0–36.0)
MCV: 76.6 fL — AB (ref 78.0–100.0)
MONOS PCT: 10 %
Monocytes Absolute: 0.9 10*3/uL (ref 0.1–1.0)
NEUTROS ABS: 3.7 10*3/uL (ref 1.7–7.7)
Neutrophils Relative %: 40 %
Platelets: 108 10*3/uL — ABNORMAL LOW (ref 150–400)
RBC: 4.31 MIL/uL (ref 4.22–5.81)
RDW: 16.6 % — AB (ref 11.5–15.5)
WBC: 9.2 10*3/uL (ref 4.0–10.5)

## 2017-08-17 LAB — RETICULOCYTES
RBC.: 4.36 MIL/uL (ref 4.22–5.81)
RETIC CT PCT: 5.3 % — AB (ref 0.4–3.1)
Retic Count, Absolute: 231.1 10*3/uL — ABNORMAL HIGH (ref 19.0–186.0)

## 2017-08-17 MED ORDER — HYDROMORPHONE HCL 1 MG/ML IJ SOLN
2.0000 mg | Freq: Once | INTRAMUSCULAR | Status: AC
  Administered 2017-08-17: 2 mg via INTRAVENOUS
  Filled 2017-08-17: qty 2

## 2017-08-17 MED ORDER — DIPHENHYDRAMINE HCL 25 MG PO CAPS
50.0000 mg | ORAL_CAPSULE | Freq: Once | ORAL | Status: AC
  Administered 2017-08-17: 50 mg via ORAL
  Filled 2017-08-17: qty 2

## 2017-08-17 NOTE — ED Notes (Signed)
Pt is asleep

## 2017-08-17 NOTE — ED Notes (Signed)
Pt verbalizes understanding of d/c instructions and denies any further needs at this time. 

## 2017-08-18 ENCOUNTER — Ambulatory Visit: Payer: 59 | Admitting: Family Medicine

## 2017-08-18 ENCOUNTER — Other Ambulatory Visit: Payer: Self-pay | Admitting: Family Medicine

## 2017-08-18 ENCOUNTER — Other Ambulatory Visit: Payer: 59

## 2017-08-18 ENCOUNTER — Telehealth (HOSPITAL_COMMUNITY): Payer: Self-pay | Admitting: General Practice

## 2017-08-18 DIAGNOSIS — Z79891 Long term (current) use of opiate analgesic: Secondary | ICD-10-CM

## 2017-08-18 DIAGNOSIS — D5700 Hb-SS disease with crisis, unspecified: Secondary | ICD-10-CM

## 2017-08-18 MED ORDER — OXYCODONE-ACETAMINOPHEN 5-325 MG PO TABS: 1.0000 | ORAL_TABLET | Freq: Four times a day (QID) | ORAL | 0 refills | Status: DC | PRN

## 2017-08-18 NOTE — Progress Notes (Signed)
Reviewed [place] Substance Reporting system prior to prescribing opiate medications. No inconsistencies noted.   Meds ordered this encounter  Medications  . oxyCODONE-acetaminophen (PERCOCET/ROXICET) 5-325 MG tablet    Sig: Take 1 tablet by mouth every 6 (six) hours as needed for severe pain.    Dispense:  60 tablet    Refill:  0    Order Specific Question:   Supervising Provider    Answer:   [name], OLUGBEMIGA E [[id number]]     Nolon NationsLachina Moore Hollis  MSN, FNP-C [facility] [address]  [place], [place] [id number] [phone]

## 2017-08-18 NOTE — Telephone Encounter (Signed)
Patient called, complained of pain in the right leg,right arm and left leg that he rated as 9/10. Denied, fever, diarrhea, abdominal pain, nausea/vomitting. Last took Ibuprofen at 6 am today. Admitted running out of his prescribed Hydrocodone yesterday. Provider notified; per provider, patient can come in at 11:30 today. Patient notified and is on his way here.

## 2017-08-23 LAB — TOXASSURE SELECT 13 (MW), URINE

## 2017-11-20 ENCOUNTER — Other Ambulatory Visit: Payer: Self-pay

## 2017-11-20 ENCOUNTER — Encounter (HOSPITAL_BASED_OUTPATIENT_CLINIC_OR_DEPARTMENT_OTHER): Payer: Self-pay | Admitting: Emergency Medicine

## 2017-11-20 ENCOUNTER — Emergency Department (HOSPITAL_BASED_OUTPATIENT_CLINIC_OR_DEPARTMENT_OTHER)
Admission: EM | Admit: 2017-11-20 | Discharge: 2017-11-20 | Disposition: A | Discharge status: Home / Self Care | Payer: 59 | Attending: Emergency Medicine | Admitting: Emergency Medicine

## 2017-11-20 DIAGNOSIS — D5700 Hb-SS disease with crisis, unspecified: Secondary | ICD-10-CM | POA: Insufficient documentation

## 2017-11-20 DIAGNOSIS — Z87891 Personal history of nicotine dependence: Secondary | ICD-10-CM | POA: Diagnosis not present

## 2017-11-20 DIAGNOSIS — Z79899 Other long term (current) drug therapy: Secondary | ICD-10-CM | POA: Diagnosis not present

## 2017-11-20 LAB — CBC WITH DIFFERENTIAL/PLATELET
Basophils Absolute: 0 10*3/uL (ref 0.0–0.1)
Basophils Relative: 0 %
Eosinophils Absolute: 0 10*3/uL (ref 0.0–0.7)
Eosinophils Relative: 0 %
HEMATOCRIT: 32.2 % — AB (ref 39.0–52.0)
HEMOGLOBIN: 11.9 g/dL — AB (ref 13.0–17.0)
LYMPHS ABS: 2.5 10*3/uL (ref 0.7–4.0)
LYMPHS PCT: 22 %
MCH: 28.3 pg (ref 26.0–34.0)
MCHC: 37 g/dL — ABNORMAL HIGH (ref 30.0–36.0)
MCV: 76.7 fL — AB (ref 78.0–100.0)
MONO ABS: 1.6 10*3/uL (ref 0.1–1.0)
MONOS PCT: 14 %
NEUTROS ABS: 7.3 10*3/uL (ref 1.7–7.7)
Neutrophils Relative %: 64 %
Platelets: 150 10*3/uL (ref 150–400)
RBC: 4.2 MIL/uL — ABNORMAL LOW (ref 4.22–5.81)
RDW: 14.9 % (ref 11.5–15.5)
WBC: 11.5 10*3/uL — ABNORMAL HIGH (ref 4.0–10.5)

## 2017-11-20 LAB — COMPREHENSIVE METABOLIC PANEL
ALBUMIN: 4.1 g/dL (ref 3.5–5.0)
ALK PHOS: 102 U/L (ref 38–126)
ALT: 23 U/L (ref 17–63)
ANION GAP: 7 (ref 5–15)
AST: 34 U/L (ref 15–41)
BUN: 6 mg/dL (ref 6–20)
CALCIUM: 8.8 mg/dL — AB (ref 8.9–10.3)
CO2: 27 mmol/L (ref 22–32)
Chloride: 105 mmol/L (ref 101–111)
Creatinine, Ser: 1.01 mg/dL (ref 0.61–1.24)
GFR calc Af Amer: >60 mL/min (ref >60)
GFR calc non Af Amer: >60 mL/min (ref >60)
GLUCOSE: 99 mg/dL (ref 65–99)
Potassium: 3.8 mmol/L (ref 3.5–5.1)
SODIUM: 139 mmol/L (ref 135–145)
Total Bilirubin: 1.5 mg/dL — ABNORMAL HIGH (ref 0.3–1.2)
Total Protein: 7.7 g/dL (ref 6.5–8.1)

## 2017-11-20 LAB — RETICULOCYTES
RBC.: 4.35 MIL/uL (ref 4.22–5.81)
RETIC CT PCT: 3.9 % — AB (ref 0.4–3.1)
Retic Count, Absolute: 169.7 10*3/uL (ref 19.0–186.0)

## 2017-11-20 MED ORDER — KETOROLAC TROMETHAMINE 30 MG/ML IJ SOLN
15.0000 mg | Freq: Once | INTRAMUSCULAR | Status: AC
  Administered 2017-11-20: 15 mg via INTRAVENOUS
  Filled 2017-11-20: qty 1

## 2017-11-20 MED ORDER — HYDROMORPHONE HCL 1 MG/ML IJ SOLN: 1.0000 mg | INTRAMUSCULAR | Status: AC

## 2017-11-20 MED ORDER — SODIUM CHLORIDE 0.45 % IV SOLN
INTRAVENOUS | Status: DC
  Administered 2017-11-20: 16:00:00 via INTRAVENOUS

## 2017-11-20 MED ORDER — HYDROMORPHONE HCL 1 MG/ML IJ SOLN
1.0000 mg | INTRAMUSCULAR | Status: AC
  Administered 2017-11-20: 1 mg via INTRAVENOUS
  Filled 2017-11-20: qty 1

## 2017-11-20 MED ORDER — HYDROMORPHONE HCL 1 MG/ML IJ SOLN
0.5000 mg | INTRAMUSCULAR | Status: AC
  Administered 2017-11-20: 0.5 mg via INTRAVENOUS

## 2017-11-20 MED ORDER — DIPHENHYDRAMINE HCL 25 MG PO CAPS
25.0000 mg | ORAL_CAPSULE | ORAL | Status: DC | PRN
  Administered 2017-11-20: 25 mg via ORAL
  Filled 2017-11-20: qty 1

## 2017-11-20 MED ORDER — ONDANSETRON HCL 4 MG/2ML IJ SOLN
4.0000 mg | INTRAMUSCULAR | Status: DC | PRN
  Administered 2017-11-20: 4 mg via INTRAVENOUS
  Filled 2017-11-20: qty 2

## 2017-11-20 MED ORDER — HYDROMORPHONE HCL 1 MG/ML IJ SOLN: 1.0000 mg | INTRAMUSCULAR | Status: DC

## 2017-11-20 MED ORDER — HYDROMORPHONE HCL 1 MG/ML IJ SOLN: 0.5000 mg | INTRAMUSCULAR | Status: AC

## 2017-11-20 MED ORDER — HYDROMORPHONE HCL 1 MG/ML IJ SOLN
1.0000 mg | INTRAMUSCULAR | Status: AC
  Administered 2017-11-20: 1 mg via INTRAVENOUS
  Filled 2017-11-20 (×2): qty 1

## 2017-11-20 NOTE — Discharge Instructions (Signed)
We saw you in the ER for your sickle cell related pain. °The labs and imaging indicate no true crisis at this time. We understand you are in sickle cell related pain none the less, and advise to call your sickle cell doctor for a followup as soon as possible. °If your symptoms get worse, return to the ER. ° °

## 2017-11-20 NOTE — ED Notes (Signed)
ED Provider at bedside. 

## 2017-11-20 NOTE — ED Provider Notes (Signed)
[facility] EMERGENCY DEPARTMENT Provider Note   CSN: [id number] Arrival date & time: 11/20/17  1436     History   Chief Complaint Chief Complaint  Patient presents with  . Sickle Cell Pain Crisis    HPI William Jennings is a 27 y.o. male.  HPI  27 yo male comes in with cc of sickle cell pain crisis. Pt has hx of sickle cell anemia, and he reports that his pain in his arms and legs started 2 days ago. Pain is constant, throbbing and similar to his pain crisis. Pt has taken oxy and ibuprofen at home without relief.  Pt doesn't know what triggered the pain.  Past Medical History:  Diagnosis Date  . Chronic prescription opiate use   . Sickle cell anemia (HCC)   . Sickle cell disease [facility](HCC)     Patient Active Problem List   Diagnosis Date Noted  . Sickle cell anemia with pain (HCC) 04/05/2016  . Acute chest syndrome due to sickle cell crisis (HCC)   . Sickle cell anemia with crisis (HCC) 02/21/2014  . Sickle cell anemia (HCC) 08/10/2013  . Chest pain 08/10/2013  . Dehydration 08/08/2013  . Sickle cell crisis (HCC) 08/06/2013  . 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 CHOLECYSTECTOMY          Home Medications    Prior to Admission medications   Medication Sig Start Date End Date Taking? Authorizing Provider  folic acid (FOLVITE) 1 MG tablet Take 1 tablet (1 mg total) by mouth daily. Patient not taking: Reported on 05/25/2017 11/26/16 11/26/17  Massie MaroonHollis, Lachina M, FNP  oxyCODONE-acetaminophen (PERCOCET/ROXICET) 5-325 MG tablet Take 1 tablet by mouth every 6 (six) hours as needed for severe pain. 08/18/17   Massie MaroonHollis, Lachina M, FNP    Family History History reviewed. No pertinent family history.  Social History Social History   Tobacco Use  . Smoking status: Former Games developermoker  . Smokeless tobacco: Never Used  Substance Use Topics  . Alcohol use: Yes     Comment: occ  . Drug use: No     Allergies   Shellfish allergy   Review of Systems Review of Systems  Constitutional: Positive for activity change. Negative for fever.  Respiratory: Negative for cough and shortness of breath.   Cardiovascular: Negative for chest pain.  Musculoskeletal: Positive for arthralgias and myalgias.  Allergic/Immunologic: Positive for immunocompromised state.  Hematological: Does not bruise/bleed easily.  All other systems reviewed and are negative.    Physical Exam Updated Vital Signs BP 123/63   Pulse (!) 108   Temp 99.1 F (37.3 C) (Oral)   Resp 19   Ht 5\' 8"  (1.727 m)   Wt 72.6 kg (160 lb)   SpO2 98%   BMI 24.33 kg/m   Physical Exam  Constitutional: He is oriented to person, place, and time. He appears well-developed.  HENT:  Head: Atraumatic.  Neck: Neck supple.  Cardiovascular: Normal rate.  Pulmonary/Chest: Effort normal.  Abdominal: Soft.  Neurological: He is alert and oriented to person, place, and time.  Skin: Skin is warm.  Nursing note and vitals reviewed.    ED Treatments / Results  Labs (all labs ordered are listed, but only abnormal results are displayed) Labs Reviewed  CBC WITH DIFFERENTIAL/PLATELET - Abnormal; Notable for the following components:      Result Value   WBC 11.5 (*)  RBC 4.20 (*)    Hemoglobin 11.9 (*)    HCT 32.2 (*)    MCV 76.7 (*)    MCHC 37.0 (*)    All other components within normal limits  COMPREHENSIVE METABOLIC PANEL - Abnormal; Notable for the following components:   Calcium 8.8 (*)    Total Bilirubin 1.5 (*)    All other components within normal limits  RETICULOCYTES    EKG None  Radiology No results found.  Procedures Procedures (including critical care time)  Medications Ordered in ED Medications  0.45 % sodium chloride infusion ( Intravenous New Bag/Given 11/20/17 1609)  HYDROmorphone (DILAUDID) injection 1 mg (has no administration in time range)    Or    HYDROmorphone (DILAUDID) injection 1 mg (has no administration in time range)  diphenhydrAMINE (BENADRYL) capsule 25-50 mg (25 mg Oral Given 11/20/17 1608)  ondansetron (ZOFRAN) injection 4 mg (4 mg Intravenous Given 11/20/17 1609)  HYDROmorphone (DILAUDID) injection 0.5 mg (0.5 mg Intravenous Given 11/20/17 1614)    Or  HYDROmorphone (DILAUDID) injection 0.5 mg ( Subcutaneous See Alternative 11/20/17 1614)  HYDROmorphone (DILAUDID) injection 1 mg (1 mg Intravenous Given 11/20/17 1649)    Or  HYDROmorphone (DILAUDID) injection 1 mg ( Subcutaneous See Alternative 11/20/17 1649)  HYDROmorphone (DILAUDID) injection 1 mg (1 mg Intravenous Given 11/20/17 1757)    Or  HYDROmorphone (DILAUDID) injection 1 mg ( Subcutaneous See Alternative 11/20/17 1757)  ketorolac (TORADOL) 30 MG/ML injection 15 mg (15 mg Intravenous Given 11/20/17 1844)     Initial Impression / Assessment and Plan / ED Course  I have reviewed the triage vital signs and the nursing notes.  Pertinent labs & imaging results that were available during my care of the patient were reviewed by me and considered in my medical decision making (see chart for details).  Clinical Course as of Nov 21 1943  Wynelle Link Nov 20, 2017  1943 Upon reassessment, patient reports that the pain has improved significantly and he is comfortable going home. Strict return precautions discussed.   [AN]    Clinical Course User Index [AN] Derwood Kaplan, MD    Pt comes in with sickle cell related pain.  VSS and WNL -  hemodynamically stable  Pain appears vaso-occlusive tpye and typical of previous pain. Will start mild hydration with 1/2 NS while patient is getting her workup. Appropriate labs ordered. Pain control with weight based dilaudid started. We will reassess patient after 3 doses. Goal is to break the pain and see if patient feels comfortable going home.  Currently, there is no signs of severe decompensation clinically. Will continue to monitor closely. If we  are unable to control the pain, we will admit the patient.   Final Clinical Impressions(s) / ED Diagnoses   Final diagnoses:  Sickle cell pain crisis [facility])    ED Discharge Orders    None       Derwood Kaplan, MD 11/20/17 1945

## 2017-11-20 NOTE — ED Notes (Signed)
Pt reports his pain has decreased and he would like to be d/c

## 2017-11-20 NOTE — ED Triage Notes (Signed)
Patient states that he has been in crisis x 48 hours. The patient reports that he has generalized pain to his bilateral lower extremities

## 2018-01-02 ENCOUNTER — Other Ambulatory Visit: Payer: Self-pay

## 2018-01-02 ENCOUNTER — Encounter (HOSPITAL_BASED_OUTPATIENT_CLINIC_OR_DEPARTMENT_OTHER): Payer: Self-pay | Admitting: Emergency Medicine

## 2018-01-02 ENCOUNTER — Emergency Department (HOSPITAL_BASED_OUTPATIENT_CLINIC_OR_DEPARTMENT_OTHER)
Admission: EM | Admit: 2018-01-02 | Discharge: 2018-01-02 | Disposition: A | Discharge status: Home / Self Care | Payer: 59 | Attending: Emergency Medicine | Admitting: Emergency Medicine

## 2018-01-02 DIAGNOSIS — Z87891 Personal history of nicotine dependence: Secondary | ICD-10-CM | POA: Diagnosis not present

## 2018-01-02 DIAGNOSIS — D5700 Hb-SS disease with crisis, unspecified: Secondary | ICD-10-CM | POA: Diagnosis not present

## 2018-01-02 LAB — CBC WITH DIFFERENTIAL/PLATELET
Basophils Absolute: 0 10*3/uL (ref 0.0–0.1)
Basophils Relative: 0 %
EOS PCT: 1 %
Eosinophils Absolute: 0.1 10*3/uL (ref 0.0–0.7)
HEMATOCRIT: 33.9 % — AB (ref 39.0–52.0)
Hemoglobin: 12.7 g/dL — ABNORMAL LOW (ref 13.0–17.0)
LYMPHS ABS: 2.5 10*3/uL (ref 0.7–4.0)
Lymphocytes Relative: 34 %
MCH: 28.5 pg (ref 26.0–34.0)
MCHC: 37.5 g/dL — ABNORMAL HIGH (ref 30.0–36.0)
MCV: 76.2 fL — AB (ref 78.0–100.0)
MONOS PCT: 15 %
Monocytes Absolute: 1.1 10*3/uL — ABNORMAL HIGH (ref 0.1–1.0)
Neutro Abs: 3.7 10*3/uL (ref 1.7–7.7)
Neutrophils Relative %: 50 %
Platelets: 170 10*3/uL (ref 150–400)
RBC: 4.45 MIL/uL (ref 4.22–5.81)
RDW: 15.5 % (ref 11.5–15.5)
WBC: 7.4 10*3/uL (ref 4.0–10.5)

## 2018-01-02 LAB — I-STAT CHEM 8, ED
BUN: 7 mg/dL (ref 6–20)
CHLORIDE: 105 mmol/L (ref 98–111)
Calcium, Ion: 1.21 mmol/L (ref 1.15–1.40)
Creatinine, Ser: 0.9 mg/dL (ref 0.61–1.24)
GLUCOSE: 102 mg/dL — AB (ref 70–99)
HEMATOCRIT: 38 % — AB (ref 39.0–52.0)
Hemoglobin: 12.9 g/dL — ABNORMAL LOW (ref 13.0–17.0)
POTASSIUM: 4 mmol/L (ref 3.5–5.1)
SODIUM: 140 mmol/L (ref 135–145)
TCO2: 24 mmol/L (ref 22–32)

## 2018-01-02 LAB — RETICULOCYTES
RBC.: 4.41 MIL/uL (ref 4.22–5.81)
Retic Count, Absolute: 198.5 10*3/uL — ABNORMAL HIGH (ref 19.0–186.0)
Retic Ct Pct: 4.5 % — ABNORMAL HIGH (ref 0.4–3.1)

## 2018-01-02 MED ORDER — HYDROMORPHONE HCL 1 MG/ML IJ SOLN: 1.0000 mg | INTRAMUSCULAR | Status: DC

## 2018-01-02 MED ORDER — HYDROMORPHONE HCL 1 MG/ML IJ SOLN: 1.0000 mg | INTRAMUSCULAR | Status: AC

## 2018-01-02 MED ORDER — ONDANSETRON HCL 4 MG/2ML IJ SOLN
4.0000 mg | INTRAMUSCULAR | Status: DC | PRN
  Administered 2018-01-02: 4 mg via INTRAVENOUS
  Filled 2018-01-02: qty 2

## 2018-01-02 MED ORDER — SODIUM CHLORIDE 0.45 % IV SOLN
INTRAVENOUS | Status: DC
  Administered 2018-01-02: 09:00:00 via INTRAVENOUS

## 2018-01-02 MED ORDER — HYDROMORPHONE HCL 1 MG/ML IJ SOLN
1.0000 mg | INTRAMUSCULAR | Status: DC
  Administered 2018-01-02: 1 mg via INTRAVENOUS
  Filled 2018-01-02: qty 1

## 2018-01-02 MED ORDER — SODIUM CHLORIDE 0.45 % IV SOLN
INTRAVENOUS | Status: DC
  Administered 2018-01-02: 10:00:00 via INTRAVENOUS

## 2018-01-02 MED ORDER — HYDROMORPHONE HCL 1 MG/ML IJ SOLN: 0.5000 mg | Freq: Once | INTRAMUSCULAR | Status: DC

## 2018-01-02 MED ORDER — DIPHENHYDRAMINE HCL 25 MG PO CAPS
25.0000 mg | ORAL_CAPSULE | ORAL | Status: DC | PRN
  Administered 2018-01-02: 25 mg via ORAL
  Filled 2018-01-02: qty 1

## 2018-01-02 MED ORDER — HYDROMORPHONE HCL 1 MG/ML IJ SOLN
1.0000 mg | INTRAMUSCULAR | Status: AC
  Administered 2018-01-02: 1 mg via INTRAVENOUS

## 2018-01-02 MED ORDER — KETOROLAC TROMETHAMINE 30 MG/ML IJ SOLN
30.0000 mg | INTRAMUSCULAR | Status: AC
  Administered 2018-01-02: 30 mg via INTRAVENOUS
  Filled 2018-01-02: qty 1

## 2018-01-02 MED ORDER — HYDROMORPHONE HCL 1 MG/ML IJ SOLN
1.0000 mg | INTRAMUSCULAR | Status: AC
  Administered 2018-01-02: 1 mg via INTRAVENOUS
  Filled 2018-01-02 (×2): qty 1

## 2018-01-02 NOTE — ED Triage Notes (Signed)
Reports to ED for sickle cell pain.  C/o bilateral lower leg pain and lower back pain.

## 2018-01-02 NOTE — Discharge Instructions (Addendum)
You were seen in the emergency department today for pain related to your sickle cell anemia.  Your labs were all reassuring, your hemoglobin was 12.7.  Please take all your medications as prescribed.  Please call your hematologist to make an appointment for follow-up within the next 48 hours.  Return to the ER anytime for new or worsening symptoms including but not limited to fever, trouble breathing, chest pain, numbness, weakness, loss of control bowel or bladder function, trouble walking, or any other concerns that you may have.

## 2018-01-02 NOTE — ED Provider Notes (Signed)
[facility] EMERGENCY DEPARTMENT Provider Note   CSN: [id number] Arrival date & time: 01/02/18  0813     History   Chief Complaint Chief Complaint  Patient presents with  . Sickle Cell Pain Crisis    HPI William Jennings is a 27 y.o. male with a hx of sickle cell anemia who presents to the ED with complaints of back and bilateral lower extremity pain x 2 days. Patient states pain has been constant, progressively worsening, no specific alleviating/aggravating factors. States he is out of his oxycodone. He has tried ibuprofen without relief.  Pain is consistent with his typical sickle cell pain. Denies fever, chest pain, dyspnea, numbness, weakness,  incontinence to bowel/bladder, fever, chills, IV drug use, or hx of cancer. He has appointment to see his sickle cell doctor next week.   HPI  Past Medical History:  Diagnosis Date  . Chronic prescription opiate use   . Sickle cell anemia (HCC)   . Sickle cell disease [facility])     Patient Active Problem List   Diagnosis Date Noted  . Sickle cell anemia with pain (HCC) 04/05/2016  . Acute chest syndrome due to sickle cell crisis (HCC)   . Sickle cell anemia with crisis (HCC) 02/21/2014  . Sickle cell anemia (HCC) 08/10/2013  . Chest pain 08/10/2013  . Dehydration 08/08/2013  . Sickle cell crisis (HCC) 08/06/2013  . 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 CHOLECYSTECTOMY          Home Medications    Prior to Admission medications   Medication Sig Start Date End Date Taking? Authorizing Provider  oxyCODONE-acetaminophen (PERCOCET/ROXICET) 5-325 MG tablet Take 1 tablet by mouth every 6 (six) hours as needed for severe pain. 08/18/17   Massie Maroon, FNP    Family History History reviewed. No pertinent family history.  Social History Social History   Tobacco Use  . Smoking status: Former Games developer  .  Smokeless tobacco: Never Used  Substance Use Topics  . Alcohol use: Yes    Comment: occ  . Drug use: No     Allergies   Shellfish allergy   Review of Systems Review of Systems  Constitutional: Negative for fever.  Respiratory: Negative for shortness of breath.   Cardiovascular: Negative for chest pain.  Musculoskeletal: Positive for back pain and myalgias.  Neurological: Negative for weakness and numbness.       Negative for incontinence or saddle anesthesia  All other systems reviewed and are negative.    Physical Exam Updated Vital Signs BP 104/63   Pulse 60   Temp 99.2 F (37.3 C) (Oral)   Resp 17   Ht 5\' 8"  (1.727 m)   Wt 68 kg (150 lb)   SpO2 96%   BMI 22.81 kg/m   Physical Exam  Constitutional: He appears well-developed and well-nourished. No distress.  HENT:  Head: Normocephalic and atraumatic.  Eyes: Conjunctivae are normal. Right eye exhibits no discharge. Left eye exhibits no discharge.  Cardiovascular: Normal rate and regular rhythm.  No murmur heard. Pulmonary/Chest: Breath sounds normal. No respiratory distress. He has no wheezes. He has no rales.  Abdominal: Soft. He exhibits no distension. There is no tenderness.  Musculoskeletal:  No obvious deformity, appreciable swelling, erythema, or ecchymosis.  Back: Diffuse lumbar region tenderness, no point/focal vertebral tenderness.  Lower extremities: Normal range of motion to all joints.  Diffusely tender,  no point/focal area of tenderness.  Neurological: He is alert.  Clear speech.  5 out of 5 strength plantar dorsiflexion bilaterally.  Sensation grossly intact bilateral lower extremities.  Patellar DTRs are 2+ and symmetric.  Gait is intact.  Skin: Skin is warm and dry. No rash noted.  Psychiatric: He has a normal mood and affect. His behavior is normal.  Nursing note and vitals reviewed.    ED Treatments / Results  Labs Results for orders placed or performed during the hospital encounter of  01/02/18  CBC with Differential  Result Value Ref Range   WBC 7.4 4.0 - 10.5 K/uL   RBC 4.45 4.22 - 5.81 MIL/uL   Hemoglobin 12.7 (L) 13.0 - 17.0 g/dL   HCT 16.133.9 (L) 09.639.0 - 04.552.0 %   MCV 76.2 (L) 78.0 - 100.0 fL   MCH 28.5 26.0 - 34.0 pg   MCHC 37.5 (H) 30.0 - 36.0 g/dL   RDW 40.915.5 81.111.5 - 91.415.5 %   Platelets 170 150 - 400 K/uL   Neutrophils Relative % 50 %   Lymphocytes Relative 34 %   Monocytes Relative 15 %   Eosinophils Relative 1 %   Basophils Relative 0 %   Neutro Abs 3.7 1.7 - 7.7 K/uL   Lymphs Abs 2.5 0.7 - 4.0 K/uL   Monocytes Absolute 1.1 (H) 0.1 - 1.0 K/uL   Eosinophils Absolute 0.1 0.0 - 0.7 K/uL   Basophils Absolute 0.0 0.0 - 0.1 K/uL   RBC Morphology POLYCHROMASIA PRESENT   Reticulocytes  Result Value Ref Range   Retic Ct Pct 4.5 (H) 0.4 - 3.1 %   RBC. 4.41 4.22 - 5.81 MIL/uL   Retic Count, Absolute 198.5 (H) 19.0 - 186.0 K/uL  I-Stat Chem 8, ED  Result Value Ref Range   Sodium 140 135 - 145 mmol/L   Potassium 4.0 3.5 - 5.1 mmol/L   Chloride 105 98 - 111 mmol/L   BUN 7 6 - 20 mg/dL   Creatinine, Ser 7.820.90 0.61 - 1.24 mg/dL   Glucose, Bld 956102 (H) 70 - 99 mg/dL   Calcium, Ion 2.131.21 0.861.15 - 1.40 mmol/L   TCO2 24 22 - 32 mmol/L   Hemoglobin 12.9 (L) 13.0 - 17.0 g/dL   HCT 57.838.0 (L) 46.939.0 - 62.952.0 %   No results found. EKG None  Radiology No results found.  Procedures Procedures (including critical care time)  Medications Ordered in ED Medications  HYDROmorphone (DILAUDID) injection 1 mg (1 mg Intravenous Given 01/02/18 0940)    Or  HYDROmorphone (DILAUDID) injection 1 mg ( Subcutaneous See Alternative 01/02/18 0940)  HYDROmorphone (DILAUDID) injection 1 mg (1 mg Intravenous Given 01/02/18 0847)    Or  HYDROmorphone (DILAUDID) injection 1 mg ( Subcutaneous See Alternative 01/02/18 0847)  ketorolac (TORADOL) 30 MG/ML injection 30 mg (30 mg Intravenous Given 01/02/18 1026)    Initial Impression / Assessment and Plan / ED Course  I have reviewed the triage vital  signs and the nursing notes.  Pertinent labs & imaging results that were available during my care of the patient were reviewed by me and considered in my medical decision making (see chart for details).  Patient presents to the emergency department with complaints of sickle cell pain crisis.  Patient nontoxic-appearing, no apparent distress, vitals without significant abnormality. Patient diffusely tender on exam without point/focal tenderness.  There is no overlying erythema/warmth, patient is afebrile, doubt infectious etiology such as cellulitis, septic joint, or osteomyelitis. No back pain red flags. He  is without chest pain, dyspnea, or fevers to raise concern for acute chest syndrome.  Labs have been reviewed, patient's hemoglobin is baseline and actually somewhat improved at 12.7 when compared to previous on record. Reticulocytes similar to previous.  No significant electrolyte abnormalities.  Renal function within normal limits.  Sickle cell ED protocol utilized, on reevaluation patient is feeling improved and ready to go home.  I encouraged him to follow-up with his hematologist. I discussed results, need for follow-up, and return precautions with the patient. Provided opportunity for questions, patient confirmed understanding and is in agreement with plan.   Vitals:   01/02/18 0930 01/02/18 1033  BP: 104/63 116/79  Pulse: 60 61  Resp: 17 18  Temp:    SpO2: 96% 98%    Final Clinical Impressions(s) / ED Diagnoses   Final diagnoses:  Sickle cell anemia with pain [facility])    ED Discharge Orders    None       Cherly Anderson, PA-C 01/02/18 1921    Tilden Fossa, MD 01/03/18 [id number]

## 2018-01-03 ENCOUNTER — Emergency Department (HOSPITAL_BASED_OUTPATIENT_CLINIC_OR_DEPARTMENT_OTHER)
Admission: EM | Admit: 2018-01-03 | Discharge: 2018-01-03 | Disposition: A | Discharge status: Home / Self Care | Payer: 59 | Attending: Emergency Medicine | Admitting: Emergency Medicine

## 2018-01-03 ENCOUNTER — Encounter (HOSPITAL_BASED_OUTPATIENT_CLINIC_OR_DEPARTMENT_OTHER): Payer: Self-pay

## 2018-01-03 ENCOUNTER — Other Ambulatory Visit: Payer: Self-pay | Admitting: Family Medicine

## 2018-01-03 DIAGNOSIS — D5700 Hb-SS disease with crisis, unspecified: Secondary | ICD-10-CM

## 2018-01-03 DIAGNOSIS — Z87891 Personal history of nicotine dependence: Secondary | ICD-10-CM | POA: Insufficient documentation

## 2018-01-03 DIAGNOSIS — D57219 Sickle-cell/Hb-C disease with crisis, unspecified: Secondary | ICD-10-CM | POA: Insufficient documentation

## 2018-01-03 LAB — COMPREHENSIVE METABOLIC PANEL
ALBUMIN: 4.5 g/dL (ref 3.5–5.0)
ALT: 29 U/L (ref 0–44)
AST: 45 U/L — AB (ref 15–41)
Alkaline Phosphatase: 110 U/L (ref 38–126)
Anion gap: 8 (ref 5–15)
BILIRUBIN TOTAL: 1.6 mg/dL — AB (ref 0.3–1.2)
BUN: 7 mg/dL (ref 6–20)
CO2: 25 mmol/L (ref 22–32)
Calcium: 9.2 mg/dL (ref 8.9–10.3)
Chloride: 108 mmol/L (ref 98–111)
Creatinine, Ser: 0.96 mg/dL (ref 0.61–1.24)
GFR calc Af Amer: >60 mL/min (ref >60)
GFR calc non Af Amer: >60 mL/min (ref >60)
GLUCOSE: 105 mg/dL — AB (ref 70–99)
POTASSIUM: 4.2 mmol/L (ref 3.5–5.1)
Sodium: 141 mmol/L (ref 135–145)
Total Protein: 8 g/dL (ref 6.5–8.1)

## 2018-01-03 LAB — RETICULOCYTES
RBC.: 4.56 MIL/uL (ref 4.22–5.81)
Retic Count, Absolute: 205.2 10*3/uL — ABNORMAL HIGH (ref 19.0–186.0)
Retic Ct Pct: 4.5 % — ABNORMAL HIGH (ref 0.4–3.1)

## 2018-01-03 LAB — CBC WITH DIFFERENTIAL/PLATELET
BASOS PCT: 0 %
Basophils Absolute: 0 10*3/uL (ref 0.0–0.1)
EOS PCT: 1 %
Eosinophils Absolute: 0.1 10*3/uL (ref 0.0–0.7)
HEMATOCRIT: 35.2 % — AB (ref 39.0–52.0)
Hemoglobin: 13.2 g/dL (ref 13.0–17.0)
LYMPHS PCT: 26 %
Lymphs Abs: 2.4 10*3/uL (ref 0.7–4.0)
MCH: 28.6 pg (ref 26.0–34.0)
MCHC: 37.5 g/dL — AB (ref 30.0–36.0)
MCV: 76.4 fL — AB (ref 78.0–100.0)
MONOS PCT: 15 %
Monocytes Absolute: 1.4 10*3/uL — ABNORMAL HIGH (ref 0.1–1.0)
NEUTROS ABS: 5.3 10*3/uL (ref 1.7–7.7)
Neutrophils Relative %: 58 %
Platelets: 172 10*3/uL (ref 150–400)
RBC: 4.61 MIL/uL (ref 4.22–5.81)
RDW: 15.5 % (ref 11.5–15.5)
WBC: 9.2 10*3/uL (ref 4.0–10.5)

## 2018-01-03 MED ORDER — HYDROMORPHONE HCL 1 MG/ML IJ SOLN: 1.0000 mg | INTRAMUSCULAR | Status: AC

## 2018-01-03 MED ORDER — DEXTROSE-NACL 5-0.45 % IV SOLN
INTRAVENOUS | Status: DC
  Administered 2018-01-03: 10:00:00 via INTRAVENOUS

## 2018-01-03 MED ORDER — KETOROLAC TROMETHAMINE 15 MG/ML IJ SOLN
15.0000 mg | Freq: Once | INTRAMUSCULAR | Status: AC
  Administered 2018-01-03: 15 mg via INTRAVENOUS
  Filled 2018-01-03: qty 1

## 2018-01-03 MED ORDER — HYDROMORPHONE HCL 1 MG/ML IJ SOLN
1.0000 mg | INTRAMUSCULAR | Status: AC
  Administered 2018-01-03: 1 mg via INTRAVENOUS
  Filled 2018-01-03: qty 1

## 2018-01-03 MED ORDER — HYDROMORPHONE HCL 1 MG/ML IJ SOLN: 1.0000 mg | INTRAMUSCULAR | Status: DC

## 2018-01-03 MED ORDER — HYDROMORPHONE HCL 1 MG/ML IJ SOLN
1.0000 mg | INTRAMUSCULAR | Status: DC
  Administered 2018-01-03: 1 mg via INTRAVENOUS

## 2018-01-03 MED ORDER — HYDROMORPHONE HCL 1 MG/ML IJ SOLN
1.0000 mg | INTRAMUSCULAR | Status: AC
  Filled 2018-01-03: qty 1

## 2018-01-03 MED ORDER — ONDANSETRON HCL 4 MG/2ML IJ SOLN: 4.0000 mg | INTRAMUSCULAR | Status: DC | PRN

## 2018-01-03 MED ORDER — HYDROCODONE-ACETAMINOPHEN 5-325 MG PO TABS: 1.0000 | ORAL_TABLET | Freq: Four times a day (QID) | ORAL | 0 refills | Status: DC | PRN

## 2018-01-03 NOTE — ED Provider Notes (Signed)
[facility] EMERGENCY DEPARTMENT Provider Note   CSN: [id number] Arrival date & time: 01/03/18  4098     History   Chief Complaint Chief Complaint  Patient presents with  . Sickle Cell Pain Crisis    HPI William Jennings is a 27 y.o. male.  The history is provided by the patient. No language interpreter was used.  Sickle Cell Pain Crisis   William Jennings is a 27 y.o. male who presents to the Emergency Department complaining of sickle cell pain crisis. He has a history of sickle cell disease and reports of pain crisis that began about 3 to 4 days ago. He states that he has pain and bilateral legs and low back. He states his pain is similar to prior pain crises. He in the past is taken oxycodone five for his pain but he has been out of this medication. He presented to the ED yesterday for similar symptoms. He denies any fevers, chest pain, shortness of breath, nausea, vomiting, abdominal pain. He states that he contacted the sickle cell center today to try and get evaluated but they were closed. Past Medical History:  Diagnosis Date  . Chronic prescription opiate use   . Sickle cell anemia (HCC)   . Sickle cell disease [facility])     Patient Active Problem List   Diagnosis Date Noted  . Sickle cell anemia with pain (HCC) 04/05/2016  . Acute chest syndrome due to sickle cell crisis (HCC)   . Sickle cell anemia with crisis (HCC) 02/21/2014  . Sickle cell anemia (HCC) 08/10/2013  . Chest pain 08/10/2013  . Dehydration 08/08/2013  . Sickle cell crisis (HCC) 08/06/2013  . 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 CHOLECYSTECTOMY          Home Medications    Prior to Admission medications   Not on File    Family History No family history on file.  Social History Social History   Tobacco Use  . Smoking status: Former Games developer  . Smokeless tobacco: Never  Used  Substance Use Topics  . Alcohol use: Yes    Comment: occ  . Drug use: No     Allergies   Shellfish allergy   Review of Systems Review of Systems  All other systems reviewed and are negative.    Physical Exam Updated Vital Signs BP 102/65   Pulse (!) 55   Temp 98.4 F (36.9 C) (Oral)   Resp 17   Ht 5\' 8"  (1.727 m)   Wt 68 kg (150 lb)   SpO2 100%   BMI 22.81 kg/m   Physical Exam  Constitutional: He is oriented to person, place, and time. He appears well-developed and well-nourished.  HENT:  Head: Normocephalic and atraumatic.  Cardiovascular: Normal rate and regular rhythm.  No murmur heard. Pulmonary/Chest: Effort normal and breath sounds normal. No respiratory distress.  Abdominal: Soft. There is no tenderness. There is no rebound and no guarding.  Musculoskeletal: He exhibits no edema or tenderness.  Neurological: He is alert and oriented to person, place, and time.  Skin: Skin is warm and dry.  Psychiatric: He has a normal mood and affect. His behavior is normal.  Nursing note and vitals reviewed.    ED Treatments / Results  Labs (all labs ordered are listed, but only abnormal results are displayed) Labs Reviewed  CBC WITH DIFFERENTIAL/PLATELET - Abnormal; Notable  for the following components:      Result Value   HCT 35.2 (*)    MCV 76.4 (*)    MCHC 37.5 (*)    Monocytes Absolute 1.4 (*)    All other components within normal limits  RETICULOCYTES - Abnormal; Notable for the following components:   Retic Ct Pct 4.5 (*)    Retic Count, Absolute 205.2 (*)    All other components within normal limits  COMPREHENSIVE METABOLIC PANEL - Abnormal; Notable for the following components:   Glucose, Bld 105 (*)    AST 45 (*)    Total Bilirubin 1.6 (*)    All other components within normal limits    EKG None  Radiology No results found.  Procedures Procedures (including critical care time)  Medications Ordered in ED Medications  dextrose 5  %-0.45 % sodium chloride infusion ( Intravenous New Bag/Given 01/03/18 0959)  HYDROmorphone (DILAUDID) injection 1 mg (has no administration in time range)    Or  HYDROmorphone (DILAUDID) injection 1 mg (has no administration in time range)  HYDROmorphone (DILAUDID) injection 1 mg (1 mg Intravenous Given 01/03/18 1113)    Or  HYDROmorphone (DILAUDID) injection 1 mg ( Subcutaneous See Alternative 01/03/18 1113)  ondansetron (ZOFRAN) injection 4 mg (has no administration in time range)  ketorolac (TORADOL) 15 MG/ML injection 15 mg (has no administration in time range)  HYDROmorphone (DILAUDID) injection 1 mg (1 mg Intravenous Given 01/03/18 0954)    Or  HYDROmorphone (DILAUDID) injection 1 mg ( Subcutaneous See Alternative 01/03/18 0954)     Initial Impression / Assessment and Plan / ED Course  I have reviewed the triage vital signs and the nursing notes.  Pertinent labs & imaging results that were available during my care of the patient were reviewed by me and considered in my medical decision making (see chart for details).     Patient with history of sickle cell anemia here for evaluation of pain consistent with prior pain crises. Labs are stable compared to priors. His pain is improved following treatment in the emergency department. Contacted sickle cell center regarding close follow-up as he is out of his regular medications. He will have follow-up tomorrow for medication refills. Counseled patient on hydration, home care and outpatient follow-up as well as return precautions.  No concerning features for acute aplastic crisis, acute infection.   Final Clinical Impressions(s) / ED Diagnoses   Final diagnoses:  Sickle cell pain crisis [facility](HCC)    ED Discharge Orders    None       Tilden Fossaees, Vicie Cech, MD 01/03/18 1144

## 2018-01-03 NOTE — Discharge Instructions (Addendum)
Remember to drink plenty of fluids and stay well hydrated.

## 2018-01-03 NOTE — ED Triage Notes (Signed)
Pt c/o sickle cell crisis x3 days, pain to both legs

## 2018-01-03 NOTE — Progress Notes (Signed)
Reviewed [place] Substance Reporting system prior to prescribing opiate medications. No inconsistencies noted.   Meds ordered this encounter  Medications  . HYDROcodone-acetaminophen (NORCO/VICODIN) 5-325 MG tablet    Sig: Take 1 tablet by mouth every 6 (six) hours as needed for moderate pain.    Dispense:  30 tablet    Refill:  0    Order Specific Question:   Supervising Provider    Answer:   [name], [name]     William NationsLachina Moore Tianna Baus  APRN, MSN, FNP-C Patient Care [facility] [address]  [place], [place] [address] [phone]

## 2018-01-04 ENCOUNTER — Ambulatory Visit: Payer: 59 | Admitting: Family Medicine

## 2018-01-18 ENCOUNTER — Ambulatory Visit: Payer: 59 | Admitting: Family Medicine

## 2018-01-20 ENCOUNTER — Encounter: Payer: Self-pay | Admitting: Family Medicine

## 2018-01-20 ENCOUNTER — Ambulatory Visit (INDEPENDENT_AMBULATORY_CARE_PROVIDER_SITE_OTHER): Payer: 59 | Admitting: Family Medicine

## 2018-01-20 VITALS — BP 121/59 | HR 109 | Temp 98.3°F | Resp 16 | Ht 68.0 in | Wt 150.0 lb

## 2018-01-20 DIAGNOSIS — E559 Vitamin D deficiency, unspecified: Secondary | ICD-10-CM | POA: Diagnosis not present

## 2018-01-20 DIAGNOSIS — D5700 Hb-SS disease with crisis, unspecified: Secondary | ICD-10-CM

## 2018-01-20 DIAGNOSIS — Z113 Encounter for screening for infections with a predominantly sexual mode of transmission: Secondary | ICD-10-CM

## 2018-01-20 MED ORDER — IBUPROFEN 800 MG PO TABS: 800.0000 mg | ORAL_TABLET | Freq: Three times a day (TID) | ORAL | 0 refills | Status: DC | PRN

## 2018-01-20 MED ORDER — HYDROCODONE-ACETAMINOPHEN 5-325 MG PO TABS: 1.0000 | ORAL_TABLET | Freq: Four times a day (QID) | ORAL | 0 refills | Status: AC | PRN

## 2018-01-20 NOTE — Progress Notes (Signed)
[facility] SICKLE CELL CARE  SICKLE CELL ANEMIA FOLLOW UP VISIT PROVIDER: Mike Gip, FNP    [id number] William Jennings   Subjective :   Past Medical History:  Diagnosis Date  . Chronic prescription opiate use   . Sickle cell anemia (HCC)   . Sickle cell disease (HCC)     Social History   Socioeconomic History  . Marital status: Single    Spouse name: Not on file  . Number of children: Not on file  . Years of education: Not on file  . Highest education level: Not on file  Occupational History  . Not on file  Social Needs  . Financial resource strain: Not on file  . Food insecurity:    Worry: Not on file    Inability: Not on file  . Transportation needs:    Medical: Not on file    Non-medical: Not on file  Tobacco Use  . Smoking status: Former Games developer  . Smokeless tobacco: Never Used  Substance and Sexual Activity  . Alcohol use: Yes    Comment: occ  . Drug use: No  . Sexual activity: Not on file  Lifestyle  . Physical activity:    Days per week: Not on file    Minutes per session: Not on file  . Stress: Not on file  Relationships  . Social connections:    Talks on phone: Not on file    Gets together: Not on file    Attends religious service: Not on file    Active member of club or organization: Not on file    Attends meetings of clubs or organizations: Not on file    Relationship status: Not on file  . Intimate partner violence:    Fear of current or ex partner: Not on file    Emotionally abused: Not on file    Physically abused: Not on file    Forced sexual activity: Not on file  Other Topics Concern  . Not on file  Social History Narrative  . Not on file    HPI   William Jennings  is a 27 y.o.  who presents for a follow up for Sickle Cell Anemia. him last hospitalization was 7/23/209. he has had 7 or more ED Visits  in the past 12 months.  Pain regimen includes: Ibuprofen and Oxycodone Hydrea Therapy: No  Patient reports that he was taken off due to this making him sick.  Medication compliance: Yes  Pain today is 0/10 and is located: usually in the right leg and bilateral arms and back.  Patient drinks 64 ounces of fluid per day.   Patient would like to have STI testing today. Reports 3 male partners in the past 3 months. Patient denies dysuria or penile discharge.   Review of Systems  Constitutional: Negative.   HENT: Negative.   Eyes: Negative.   Respiratory: Negative.   Cardiovascular: Negative.   Gastrointestinal: Negative.   Genitourinary: Negative.   Musculoskeletal: Negative.   Skin: Negative.   Neurological: Negative.   Psychiatric/Behavioral: Negative.     Objective  BP (!) 121/59 (BP Location: Left Arm, Patient Position: Sitting, Cuff Size: Normal)   Pulse (!) 109   Temp 98.3 F (36.8 C) (Oral)   Resp 16   Ht 5\' 8"  (1.727 m)   Wt 150 lb (68 kg)   SpO2 97%   BMI 22.81 kg/m    Physical Exam  Constitutional: He is oriented to person,  place, and time. He appears well-developed and well-nourished.  HENT:  Head: Normocephalic and atraumatic.  Mouth/Throat: No oropharyngeal exudate.  Eyes: Pupils are equal, round, and reactive to light. Conjunctivae and EOM are normal.  Neck: Normal range of motion. Neck supple. No tracheal deviation present. No thyromegaly present.  Cardiovascular: Normal rate, regular rhythm, normal heart sounds and intact distal pulses. Exam reveals no friction rub.  No murmur heard. Pulmonary/Chest: Effort normal and breath sounds normal. No stridor. No respiratory distress. He has no wheezes.  Abdominal: Soft. Bowel sounds are normal. He exhibits no distension and no mass. There is no tenderness.  Musculoskeletal: Normal range of motion.  Lymphadenopathy:    He has no cervical adenopathy.  Neurological: He is alert and oriented to person, place, and time.  Skin: Skin is warm and dry.  Psychiatric: He has a normal mood and affect. His  behavior is normal. Judgment and thought content normal.  Nursing note and vitals reviewed.     Assessment   Encounter Diagnoses  Name Primary?  . Sickle cell pain crisis (HCC) Yes  . Vitamin D deficiency   . Sickle cell anemia with pain (HCC)   . Screen for STD (sexually transmitted disease)      Plan  1. Sickle cell pain crisis (HCC)  - HYDROcodone-acetaminophen (NORCO/VICODIN) 5-325 MG tablet; Take 1 tablet by mouth every 6 (six) hours as needed for up to 15 days for moderate pain.  Dispense: 60 tablet; Refill: 0 - ibuprofen (ADVIL,MOTRIN) 800 MG tablet; Take 1 tablet (800 mg total) by mouth every 8 (eight) hours as needed.  Dispense: 30 tablet; Refill: 0  2. Vitamin D deficiency - VITAMIN D 25 Hydroxy (Vit-D Deficiency, Fractures)  3. Sickle cell anemia with pain (HCC) - CBC with Differential - Comprehensive metabolic panel - Reticulocytes - [id number]+OXYCODONE+CRT-UNBUND  4. Screen for STD (sexually transmitted disease) - Chlamydia/GC NAA, Confirmation - HIV antibody (with reflex) - RPR   Return to care as scheduled and prn. Patient verbalized understanding and agreed with plan of care.   1. Sickle cell disease -  We discussed the need for good hydration, monitoring of hydration status, avoidance of heat, cold, stress, and infection triggers. We discussed the risks and benefits of Hydrea, including bone marrow suppression, the possibility of GI upset, skin ulcers, hair thinning, and teratogenicity. The patient was reminded of the need to seek medical attention of any symptoms of bleeding, anemia, or infection. Continue folic acid 1 mg daily to prevent aplastic bone marrow crises.   2. Pulmonary evaluation - Patient denies severe recurrent wheezes, shortness of breath with exercise, or persistent cough. If these symptoms develop, pulmonary function tests with spirometry will be ordered, and if abnormal, plan on referral to Pulmonology for further evaluation.  3. Cardiac -  Routine screening for pulmonary hypertension is not recommended.  4. Eye - High risk of proliferative retinopathy. Annual eye exam with retinal exam recommended to patient.  5. Immunization status -  Yearly influenza vaccination is recommended, as well as being up to date with Meningococcal and Pneumococcal vaccines.   6. Acute and chronic painful episodes - We agreed on NSAIDS and NORCO 5/325 Q6h prn . We discussed that pt is to receive Schedule II prescriptions only from us. Pt is also aware that the prescription history is available to us online through the [place] CS[place]. Controlled substance agreement signed (Date). We reminded (Pt) that all patients receiving Schedule II narcotics must be seen for follow within one month  of prescription being requested. We reviewed the terms of our pain agreement, including the need to keep medicines in a safe locked location away from children or pets, and the need to report excess sedation or constipation, measures to avoid constipation, and policies related to early refills and stolen prescriptions. According to the [place] [facility] program, we have reviewed details related to analgesia, adverse effects, aberrant behaviors.  7. Iron overload from chronic transfusion.  Not applicable. If this occurs will use Exjade for management.   8. Vitamin D deficiency - Drisdol 50,000 units weekly. Patient encouraged to take as prescribed.   The above recommendations are taken from the NIH Evidence-Based Management of Sickle Cell Disease: Expert Panel Report, 16109.   Ms. Andr L. Riley Lam, FNP-BC [facility] [address] [place], [place] [address] [phone]  This note has been created with Dragon speech recognition software and smart phrase technology. Any transcriptional errors are unintentional.

## 2018-01-20 NOTE — Patient Instructions (Signed)
Sexually Transmitted Disease A sexually transmitted disease (STD) is a disease or infection that may be passed (transmitted) from person to person, usually during sexual activity. This may happen by way of saliva, semen, blood, vaginal mucus, or urine. Common STDs include:  Gonorrhea.  Chlamydia.  Syphilis.  HIV and AIDS.  Genital herpes.  Hepatitis B and C.  Trichomonas.  Human papillomavirus (HPV).  Pubic lice.  Scabies.  Mites.  Bacterial vaginosis.  What are the causes? An STD may be caused by bacteria, a virus, or parasites. STDs are often transmitted during sexual activity if one person is infected. However, they may also be transmitted through nonsexual means. STDs may be transmitted after:  Sexual intercourse with an infected person.  Sharing sex toys with an infected person.  Sharing needles with an infected person or using unclean piercing or tattoo needles.  Having intimate contact with the genitals, mouth, or rectal areas of an infected person.  Exposure to infected fluids during birth.  What are the signs or symptoms? Different STDs have different symptoms. Some people may not have any symptoms. If symptoms are present, they may include:  Painful or bloody urination.  Pain in the pelvis, abdomen, vagina, anus, throat, or eyes.  A skin rash, itching, or irritation.  Growths, ulcerations, blisters, or sores in the genital and anal areas.  Abnormal vaginal discharge with or without bad odor.  Penile discharge in men.  Fever.  Pain or bleeding during sexual intercourse.  Swollen glands in the groin area.  Yellow skin and eyes (jaundice). This is seen with hepatitis.  Swollen testicles.  Infertility.  Sores and blisters in the mouth.  How is this diagnosed? To make a diagnosis, your health care provider may:  Take a medical history.  Perform a physical exam.  Take a sample of any discharge to examine.  Swab the throat, cervix,  opening to the penis, rectum, or vagina for testing.  Test a sample of your first morning urine.  Perform blood tests.  Perform a Pap test, if this applies.  Perform a colposcopy.  Perform a laparoscopy.  How is this treated? Treatment depends on the STD. Some STDs may be treated but not cured.  Chlamydia, gonorrhea, trichomonas, and syphilis can be cured with antibiotic medicine.  Genital herpes, hepatitis, and HIV can be treated, but not cured, with prescribed medicines. The medicines lessen symptoms.  Genital warts from HPV can be treated with medicine or by freezing, burning (electrocautery), or surgery. Warts may come back.  HPV cannot be cured with medicine or surgery. However, abnormal areas may be removed from the cervix, vagina, or vulva.  If your diagnosis is confirmed, your recent sexual partners need treatment. This is true even if they are symptom-free or have a negative culture or evaluation. They should not have sex until their health care providers say it is okay.  Your health care provider may test you for infection again 3 months after treatment.  How is this prevented? Take these steps to reduce your risk of getting an STD:  Use latex condoms, dental dams, and water-soluble lubricants during sexual activity. Do not use petroleum jelly or oils.  Avoid having multiple sex partners.  Do not have sex with someone who has other sex partners.  Do not have sex with anyone you do not know or who is at high risk for an STD.  Avoid risky sex practices that can break your skin.  Do not have sex if you have open sores  on your mouth or skin.  Avoid drinking too much alcohol or taking illegal drugs. Alcohol and drugs can affect your judgment and put you in a vulnerable position.  Avoid engaging in oral and anal sex acts.  Get vaccinated for HPV and hepatitis. If you have not received these vaccines in the past, talk to your health care provider about whether one or  both might be right for you.  If you are at risk of being infected with HIV, it is recommended that you take a prescription medicine daily to prevent HIV infection. This is called pre-exposure prophylaxis (PrEP). You are considered at risk if: ? You are a man who has sex with other men (MSM). ? You are a heterosexual man or woman and are sexually active with more than one partner. ? You take drugs by injection. ? You are sexually active with a partner who has HIV.  Talk with your health care provider about whether you are at high risk of being infected with HIV. If you choose to begin PrEP, you should first be tested for HIV. You should then be tested every 3 months for as long as you are taking PrEP.  Contact a health care provider if:  See your health care provider.  Tell your sexual partner(s). They should be tested and treated for any STDs.  Do not have sex until your health care provider says it is okay. Get help right away if: Contact your health care provider right away if:  You have severe abdominal pain.  You are a man and notice swelling or pain in your testicles.  You are a woman and notice swelling or pain in your vagina.  This information is not intended to replace advice given to you by your health care provider. Make sure you discuss any questions you have with your health care provider. Document Released: 08/21/2002 Document Revised: 12/19/2015 Document Reviewed: 12/19/2012 Elsevier Interactive Patient Education  2018 Elsevier Inc. Sickle Cell Anemia, Adult Sickle cell anemia is a condition where your red blood cells are shaped like sickles. Red blood cells carry oxygen through the body. Sickle-shaped red blood cells do not live as long as normal red blood cells. They also clump together and block blood from flowing through the blood vessels. These things prevent the body from getting enough oxygen. Sickle cell anemia causes organ damage and pain. It also increases the  risk of infection. Follow these instructions at home:  Drink enough fluid to keep your pee (urine) clear or pale yellow. Drink more in hot weather and during exercise.  Do not smoke. Smoking lowers oxygen levels in the blood.  Only take over-the-counter or prescription medicines as told by your doctor.  Take antibiotic medicines as told by your doctor. Make sure you finish them even if you start to feel better.  Take supplements as told by your doctor.  Consider wearing a medical alert bracelet. This tells anyone caring for you in an emergency of your condition.  When traveling, keep your medical information, doctors' names, and the medicines you take with you at all times.  If you have a fever, do not take fever medicines right away. This could cover up a problem. Tell your doctor.  Keep all follow-up visits with your doctor. Sickle cell anemia requires regular medical care. Contact a doctor if: You have a fever. Get help right away if:  You feel dizzy or faint.  You have new belly (abdominal) pain, especially on the left side near  the stomach area.  You have a lasting, often uncomfortable and painful erection of the penis (priapism). If it is not treated right away, you will become unable to have sex (impotence).  You have numbness in your arms or legs or you have a hard time moving them.  You have a hard time talking.  You have a fever or lasting symptoms for more than 2-3 days.  You have a fever and your symptoms suddenly get worse.  You have signs or symptoms of infection. These include: ? Chills. ? Being more tired than normal (lethargy). ? Irritability. ? Poor eating. ? Throwing up (vomiting).  You have pain that is not helped with medicine.  You have shortness of breath.  You have pain in your chest.  You are coughing up pus-like or bloody mucus.  You have a stiff neck.  Your feet or hands swell or have pain.  Your belly looks bloated.  Your joints  hurt. This information is not intended to replace advice given to you by your health care provider. Make sure you discuss any questions you have with your health care provider. Document Released: 03/21/2013 Document Revised: 11/06/2015 Document Reviewed: 01/10/2013 Elsevier Interactive Patient Education  2017 [employer]

## 2018-01-21 LAB — COMPREHENSIVE METABOLIC PANEL
ALT: 17 IU/L (ref 0–44)
AST: 23 IU/L (ref 0–40)
Albumin/Globulin Ratio: 1.8 (ref 1.2–2.2)
Albumin: 4.8 g/dL (ref 3.5–5.5)
Alkaline Phosphatase: 102 IU/L (ref 39–117)
BUN/Creatinine Ratio: 7 — ABNORMAL LOW (ref 9–20)
BUN: 7 mg/dL (ref 6–20)
Bilirubin Total: 1.3 mg/dL — ABNORMAL HIGH (ref 0.0–1.2)
CO2: 22 mmol/L (ref 20–29)
Calcium: 9.1 mg/dL (ref 8.7–10.2)
Chloride: 103 mmol/L (ref 96–106)
Creatinine, Ser: 0.98 mg/dL (ref 0.76–1.27)
GFR calc Af Amer: 120 mL/min/{1.73_m2} (ref >59)
GFR calc non Af Amer: 104 mL/min/{1.73_m2} (ref >59)
Globulin, Total: 2.7 g/dL (ref 1.5–4.5)
Glucose: 82 mg/dL (ref 65–99)
Potassium: 4.1 mmol/L (ref 3.5–5.2)
Sodium: 141 mmol/L (ref 134–144)
Total Protein: 7.5 g/dL (ref 6.0–8.5)

## 2018-01-21 LAB — CBC WITH DIFFERENTIAL/PLATELET
Basophils Absolute: 0 10*3/uL (ref 0.0–0.2)
Basos: 0 %
EOS (ABSOLUTE): 0 10*3/uL (ref 0.0–0.4)
Eos: 0 %
Hematocrit: 38.8 % (ref 37.5–51.0)
Hemoglobin: 12.5 g/dL — ABNORMAL LOW (ref 13.0–17.7)
Immature Grans (Abs): 0 10*3/uL (ref 0.0–0.1)
Immature Granulocytes: 0 %
Lymphocytes Absolute: 2.7 10*3/uL (ref 0.7–3.1)
Lymphs: 42 %
MCH: 27.7 pg (ref 26.6–33.0)
MCHC: 32.2 g/dL (ref 31.5–35.7)
MCV: 86 fL (ref 79–97)
Monocytes Absolute: 0.7 10*3/uL (ref 0.1–0.9)
Monocytes: 11 %
Neutrophils Absolute: 2.9 10*3/uL (ref 1.4–7.0)
Neutrophils: 47 %
Platelets: 171 10*3/uL (ref 150–450)
RBC: 4.52 x10E6/uL (ref 4.14–5.80)
RDW: 16.6 % — ABNORMAL HIGH (ref 12.3–15.4)
WBC: 6.3 10*3/uL (ref 3.4–10.8)

## 2018-01-21 LAB — MED LIST OPTION NOT SELECTED

## 2018-01-21 LAB — RETICULOCYTES: Retic Ct Pct: 4.5 % — ABNORMAL HIGH (ref 0.6–2.6)

## 2018-01-21 LAB — HIV ANTIBODY (ROUTINE TESTING W REFLEX): HIV Screen 4th Generation wRfx: NONREACTIVE

## 2018-01-21 LAB — RPR: RPR Ser Ql: NONREACTIVE

## 2018-01-21 LAB — VITAMIN D 25 HYDROXY (VIT D DEFICIENCY, FRACTURES): Vit D, 25-Hydroxy: 10.3 ng/mL — ABNORMAL LOW (ref 30.0–100.0)

## 2018-01-24 LAB — 737588 9+OXYCODONE+CRT-UNBUND
Amphetamine Scrn, Ur: NEGATIVE ng/mL
BARBITURATE SCREEN URINE: NEGATIVE ng/mL
BENZODIAZEPINE SCREEN, URINE: NEGATIVE ng/mL
Cocaine (Metab) Scrn, Ur: NEGATIVE ng/mL
Creatinine(Crt), U: 187.2 mg/dL (ref 20.0–300.0)
Methadone Screen, Urine: NEGATIVE ng/mL
OXYCODONE+OXYMORPHONE UR QL SCN: NEGATIVE ng/mL
Opiate Scrn, Ur: NEGATIVE ng/mL
Ph of Urine: 6.5 (ref 4.5–8.9)
Phencyclidine Qn, Ur: NEGATIVE ng/mL
Propoxyphene Scrn, Ur: NEGATIVE ng/mL

## 2018-01-24 LAB — CANNABINOID (GC/MS), URINE
Cannabinoid: POSITIVE — AB
Carboxy THC (GC/MS): >400 ng/mL

## 2018-01-25 ENCOUNTER — Other Ambulatory Visit: Payer: Self-pay | Admitting: Family Medicine

## 2018-01-25 ENCOUNTER — Telehealth: Payer: Self-pay

## 2018-01-25 MED ORDER — AZITHROMYCIN 500 MG PO TABS: 1000.0000 mg | ORAL_TABLET | Freq: Every day | ORAL | 0 refills | Status: AC

## 2018-01-25 NOTE — Telephone Encounter (Signed)
-----   Message from Mike GipAndre Douglas, FNP sent at 01/25/2018 11:46 AM EDT ----- Please inform patient that he is positive for chlamydia. I am sending the treatment to the pharmacy. He will need to notify partners and abstain from sex for 2 weeks. Partners will also need to seek treatment.

## 2018-01-25 NOTE — Telephone Encounter (Signed)
Called, no answer. Left a message for patient to return call. Thanks!  

## 2018-01-25 NOTE — Progress Notes (Signed)
Please inform patient that he is positive for chlamydia. I am sending the treatment to the pharmacy. He will need to notify partners and abstain from sex for 2 weeks. Partners will also need to seek treatment.

## 2018-01-25 NOTE — Telephone Encounter (Signed)
Patient called back and I advised that patient had tested positive for chlamydia. Advised that we have sent in azithromycin and to take as a single does. Advised that partners will need to be treated also and to avoid sex for 2 weeks. Patient verbalized understanding. Thanks!

## 2018-01-25 NOTE — Progress Notes (Signed)
[facility]

## 2018-01-26 LAB — CHLAMYDIA/GC NAA, CONFIRMATION
Chlamydia trachomatis, NAA: POSITIVE — AB
Neisseria gonorrhoeae, NAA: NEGATIVE

## 2018-01-26 LAB — C. TRACHOMATIS NAA, CONFIRM: C. trachomatis NAA, Confirm: POSITIVE — AB

## 2018-01-27 ENCOUNTER — Ambulatory Visit: Payer: 59 | Admitting: Family Medicine

## 2018-02-14 ENCOUNTER — Other Ambulatory Visit: Payer: Self-pay

## 2018-02-14 ENCOUNTER — Emergency Department (HOSPITAL_BASED_OUTPATIENT_CLINIC_OR_DEPARTMENT_OTHER)
Admission: EM | Admit: 2018-02-14 | Discharge: 2018-02-15 | Disposition: A | Discharge status: Home / Self Care | Payer: 59 | Attending: Emergency Medicine | Admitting: Emergency Medicine

## 2018-02-14 ENCOUNTER — Encounter (HOSPITAL_BASED_OUTPATIENT_CLINIC_OR_DEPARTMENT_OTHER): Payer: Self-pay

## 2018-02-14 DIAGNOSIS — Z87891 Personal history of nicotine dependence: Secondary | ICD-10-CM | POA: Diagnosis not present

## 2018-02-14 DIAGNOSIS — M79605 Pain in left leg: Secondary | ICD-10-CM | POA: Insufficient documentation

## 2018-02-14 DIAGNOSIS — D5700 Hb-SS disease with crisis, unspecified: Secondary | ICD-10-CM | POA: Insufficient documentation

## 2018-02-14 DIAGNOSIS — M79601 Pain in right arm: Secondary | ICD-10-CM | POA: Diagnosis present

## 2018-02-14 LAB — COMPREHENSIVE METABOLIC PANEL
ALBUMIN: 4.4 g/dL (ref 3.5–5.0)
ALK PHOS: 97 U/L (ref 38–126)
ALT: 21 U/L (ref 0–44)
ANION GAP: 9 (ref 5–15)
AST: 35 U/L (ref 15–41)
BUN: 7 mg/dL (ref 6–20)
CHLORIDE: 105 mmol/L (ref 98–111)
CO2: 26 mmol/L (ref 22–32)
CREATININE: 0.87 mg/dL (ref 0.61–1.24)
Calcium: 9 mg/dL (ref 8.9–10.3)
GFR calc non Af Amer: >60 mL/min (ref >60)
GLUCOSE: 125 mg/dL — AB (ref 70–99)
Potassium: 3.7 mmol/L (ref 3.5–5.1)
Sodium: 140 mmol/L (ref 135–145)
Total Bilirubin: 1.6 mg/dL — ABNORMAL HIGH (ref 0.3–1.2)
Total Protein: 7.6 g/dL (ref 6.5–8.1)

## 2018-02-14 LAB — CBC WITH DIFFERENTIAL/PLATELET
BASOS PCT: 0 %
Basophils Absolute: 0 10*3/uL (ref 0.0–0.1)
EOS PCT: 1 %
Eosinophils Absolute: 0.1 10*3/uL (ref 0.0–0.7)
HCT: 32.9 % — ABNORMAL LOW (ref 39.0–52.0)
Hemoglobin: 12.3 g/dL — ABNORMAL LOW (ref 13.0–17.0)
LYMPHS ABS: 4.3 10*3/uL — AB (ref 0.7–4.0)
LYMPHS PCT: 37 %
MCH: 28.8 pg (ref 26.0–34.0)
MCHC: 37.4 g/dL — ABNORMAL HIGH (ref 30.0–36.0)
MCV: 77 fL — AB (ref 78.0–100.0)
MONO ABS: 1.2 10*3/uL — AB (ref 0.1–1.0)
Monocytes Relative: 10 %
NEUTROS ABS: 5.9 10*3/uL (ref 1.7–7.7)
NEUTROS PCT: 52 %
PLATELETS: 177 10*3/uL (ref 150–400)
RBC: 4.27 MIL/uL (ref 4.22–5.81)
RDW: 14.7 % (ref 11.5–15.5)
WBC: 11.5 10*3/uL — ABNORMAL HIGH (ref 4.0–10.5)

## 2018-02-14 MED ORDER — KETOROLAC TROMETHAMINE 30 MG/ML IJ SOLN
15.0000 mg | Freq: Once | INTRAMUSCULAR | Status: AC
  Administered 2018-02-14: 15 mg via INTRAVENOUS
  Filled 2018-02-14: qty 1

## 2018-02-14 MED ORDER — DEXTROSE-NACL 5-0.9 % IV SOLN
Freq: Once | INTRAVENOUS | Status: AC
  Administered 2018-02-14: 23:00:00 via INTRAVENOUS

## 2018-02-14 MED ORDER — HYDROMORPHONE HCL 1 MG/ML IJ SOLN
1.0000 mg | Freq: Once | INTRAMUSCULAR | Status: AC
  Administered 2018-02-14: 1 mg via INTRAVENOUS
  Filled 2018-02-14: qty 1

## 2018-02-14 NOTE — ED Triage Notes (Signed)
C/o sickle cell crisis-pain to right arm and left leg-NAD-steady gait

## 2018-02-15 ENCOUNTER — Encounter (HOSPITAL_BASED_OUTPATIENT_CLINIC_OR_DEPARTMENT_OTHER): Payer: Self-pay | Admitting: Emergency Medicine

## 2018-02-15 ENCOUNTER — Telehealth (HOSPITAL_COMMUNITY): Payer: Self-pay

## 2018-02-15 ENCOUNTER — Telehealth (HOSPITAL_COMMUNITY): Payer: Self-pay | Admitting: General Practice

## 2018-02-15 LAB — RETICULOCYTES
RBC.: 4.27 MIL/uL (ref 4.22–5.81)
RETIC COUNT ABSOLUTE: 162.3 10*3/uL (ref 19.0–186.0)
Retic Ct Pct: 3.8 % — ABNORMAL HIGH (ref 0.4–3.1)

## 2018-02-15 MED ORDER — HYDROMORPHONE HCL 1 MG/ML IJ SOLN
1.0000 mg | Freq: Once | INTRAMUSCULAR | Status: AC
  Administered 2018-02-15: 1 mg via INTRAVENOUS

## 2018-02-15 MED ORDER — HYDROMORPHONE HCL 1 MG/ML IJ SOLN
INTRAMUSCULAR | Status: AC
  Filled 2018-02-15: qty 1

## 2018-02-15 NOTE — ED Provider Notes (Signed)
[facility] EMERGENCY DEPARTMENT Provider Note   CSN: [id number] Arrival date & time: 02/14/18  2114     History   Chief Complaint Chief Complaint  Patient presents with  . Sickle Cell Pain Crisis    HPI William Jennings is a 27 y.o. male.  The history is provided by the patient.  Sickle Cell Pain Crisis  Location:  Lower extremity and upper extremity Severity:  Severe Onset quality:  Gradual Duration:  2 days Similar to previous crisis episodes: yes   Timing:  Constant Progression:  Worsening Chronicity:  Recurrent Sickle cell genotype:  SS Frequency of attacks:  Every 2 months History of pulmonary emboli: no   Context: not alcohol consumption, not infection and not non-compliance   Relieved by:  Nothing Worsened by:  Nothing Ineffective treatments:  None tried Associated symptoms: no chest pain, no congestion, no cough, no fever, no nausea, no priapism, no sore throat, no swelling of legs and no vision change   Risk factors: no cholecystectomy and no hx of stroke     Past Medical History:  Diagnosis Date  . Chronic prescription opiate use   . Sickle cell anemia (HCC)   . Sickle cell disease [facility])     Patient Active Problem List   Diagnosis Date Noted  . Sickle cell anemia with pain (HCC) 04/05/2016  . Acute chest syndrome due to sickle cell crisis (HCC)   . Sickle cell anemia with crisis (HCC) 02/21/2014  . Sickle cell anemia (HCC) 08/10/2013  . Chest pain 08/10/2013  . Dehydration 08/08/2013  . Sickle cell crisis (HCC) 08/06/2013  . 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 CHOLECYSTECTOMY          Home Medications    Prior to Admission medications   Medication Sig Start Date End Date Taking? Authorizing Provider  ibuprofen (ADVIL,MOTRIN) 800 MG tablet Take 1 tablet (800 mg total) by mouth every 8 (eight) hours as needed. 01/20/18    Mike Gip, FNP    Family History No family history on file.  Social History Social History   Tobacco Use  . Smoking status: Former Games developer  . Smokeless tobacco: Never Used  Substance Use Topics  . Alcohol use: Yes    Comment: occ  . Drug use: No     Allergies   Shellfish allergy   Review of Systems Review of Systems  Constitutional: Negative for fever.  HENT: Negative for congestion and sore throat.   Respiratory: Negative for cough.   Cardiovascular: Negative for chest pain.  Gastrointestinal: Negative for abdominal pain and nausea.  Genitourinary: Negative for flank pain.  Musculoskeletal: Positive for arthralgias. Negative for back pain, neck pain and neck stiffness.  Skin: Negative for color change.  All other systems reviewed and are negative.    Physical Exam Updated Vital Signs BP (!) 114/58 (BP Location: Right Arm)   Pulse 62   Temp 97.7 F (36.5 C) (Oral)   Resp 14   Ht 5\' 8"  (1.727 m)   Wt 68.9 kg   SpO2 100%   BMI 23.11 kg/m   Physical Exam  Constitutional: He is oriented to person, place, and time. He appears well-developed and well-nourished. No distress.  HENT:  Head: Normocephalic and atraumatic.  Mouth/Throat: No oropharyngeal exudate.  Eyes: Pupils are equal, round, and reactive to light. Conjunctivae are normal.  Neck: Normal range of motion.  Neck supple.  Cardiovascular: Normal rate, regular rhythm, normal heart sounds and intact distal pulses.  Pulmonary/Chest: Effort normal and breath sounds normal. No stridor. He has no wheezes. He has no rales.  Abdominal: Soft. Bowel sounds are normal. He exhibits no mass. There is no tenderness. There is no rebound and no guarding.  Musculoskeletal: Normal range of motion. He exhibits no edema, tenderness or deformity.  Neurological: He is alert and oriented to person, place, and time. He displays normal reflexes.  Skin: Skin is warm and dry. Capillary refill takes less than 2 seconds.    Psychiatric: He has a normal mood and affect.     ED Treatments / Results  Labs (all labs ordered are listed, but only abnormal results are displayed) Results for orders placed or performed during the hospital encounter of 02/14/18  Comprehensive metabolic panel  Result Value Ref Range   Sodium 140 135 - 145 mmol/L   Potassium 3.7 3.5 - 5.1 mmol/L   Chloride 105 98 - 111 mmol/L   CO2 26 22 - 32 mmol/L   Glucose, Bld 125 (H) 70 - 99 mg/dL   BUN 7 6 - 20 mg/dL   Creatinine, Ser 1.61 0.61 - 1.24 mg/dL   Calcium 9.0 8.9 - 09.6 mg/dL   Total Protein 7.6 6.5 - 8.1 g/dL   Albumin 4.4 3.5 - 5.0 g/dL   AST 35 15 - 41 U/L   ALT 21 0 - 44 U/L   Alkaline Phosphatase 97 38 - 126 U/L   Total Bilirubin 1.6 (H) 0.3 - 1.2 mg/dL   GFR calc non Af Amer >60 >60 mL/min   GFR calc Af Amer >60 >60 mL/min   Anion gap 9 5 - 15  CBC with Differential  Result Value Ref Range   WBC 11.5 (H) 4.0 - 10.5 K/uL   RBC 4.27 4.22 - 5.81 MIL/uL   Hemoglobin 12.3 (L) 13.0 - 17.0 g/dL   HCT 04.5 (L) 40.9 - 81.1 %   MCV 77.0 (L) 78.0 - 100.0 fL   MCH 28.8 26.0 - 34.0 pg   MCHC 37.4 (H) 30.0 - 36.0 g/dL   RDW 91.4 78.2 - 95.6 %   Platelets 177 150 - 400 K/uL   Neutrophils Relative % 52 %   Lymphocytes Relative 37 %   Monocytes Relative 10 %   Eosinophils Relative 1 %   Basophils Relative 0 %   Neutro Abs 5.9 1.7 - 7.7 K/uL   Lymphs Abs 4.3 (H) 0.7 - 4.0 K/uL   Monocytes Absolute 1.2 (H) 0.1 - 1.0 K/uL   Eosinophils Absolute 0.1 0.0 - 0.7 K/uL   Basophils Absolute 0.0 0.0 - 0.1 K/uL   RBC Morphology TARGET CELLS   Reticulocytes  Result Value Ref Range   Retic Ct Pct 3.8 (H) 0.4 - 3.1 %   RBC. 4.27 4.22 - 5.81 MIL/uL   Retic Count, Absolute 162.3 19.0 - 186.0 K/uL   No results found.  EKG None  Radiology No results found.  Procedures Procedures (including critical care time)  Medications Ordered in ED Medications  HYDROmorphone (DILAUDID) injection 1 mg (1 mg Intravenous Given 02/14/18 2315)   dextrose 5 %-0.9 % sodium chloride infusion ( Intravenous New Bag/Given 02/14/18 2313)  ketorolac (TORADOL) 30 MG/ML injection 15 mg (15 mg Intravenous Given 02/14/18 2313)  HYDROmorphone (DILAUDID) injection 1 mg (1 mg Intravenous Given 02/15/18 0115)       Final Clinical Impressions(s) / ED Diagnoses   Final diagnoses:  Sickle cell anemia with pain (HCC)    Improved post medications.    Return for fevers >100.4 unrelieved by medication, shortness of breath, intractable vomiting, or diarrhea, Inability to tolerate liquids or food, cough, altered mental status or any concerns. No signs of systemic illness or infection. The patient is nontoxic-appearing on exam and vital signs are within normal limits.   I have reviewed the triage vital signs and the nursing notes. Pertinent labs &imaging results that were available during my care of the patient were reviewed by me and considered in my medical decision making (see chart for details).  After history, exam, and medical workup I feel the patient has been appropriately medically screened and is safe for discharge home. Pertinent diagnoses were discussed with the patient. Patient was given return precautions.   Rex Oesterle, MD 02/15/18 [phone]

## 2018-02-15 NOTE — Telephone Encounter (Signed)
Patient called, complained of pain in the left  leg and right arm that he rated at 8.5/10. Denied chest pain, fever, diarrhea, abdominal pain, nausea/vomitting. Admitted to having means of transportation without driving self after treatment. Last took Hydrocodone 5 mg. Was at the ER until about 02:00 am today.Provider notified; per provider, patient can come in for the treatment.. Patient notified verbalized understanding.

## 2018-02-15 NOTE — Telephone Encounter (Signed)
Patient has not shown up to day hospital for treatment as agreed.  Left message for him to call us back at [phone]

## 2018-02-16 ENCOUNTER — Encounter: Payer: Self-pay | Admitting: Family Medicine

## 2018-02-16 ENCOUNTER — Ambulatory Visit (INDEPENDENT_AMBULATORY_CARE_PROVIDER_SITE_OTHER): Payer: 59 | Admitting: Family Medicine

## 2018-02-16 VITALS — BP 123/71 | HR 74 | Temp 98.2°F | Resp 16 | Ht 68.0 in | Wt 148.0 lb

## 2018-02-16 DIAGNOSIS — D5700 Hb-SS disease with crisis, unspecified: Secondary | ICD-10-CM

## 2018-02-16 NOTE — Patient Instructions (Signed)
Sickle Cell Anemia, Adult °Sickle cell anemia is a condition where your red blood cells are shaped like sickles. Red blood cells carry oxygen through the body. Sickle-shaped red blood cells do not live as long as normal red blood cells. They also clump together and block blood from flowing through the blood vessels. These things prevent the body from getting enough oxygen. Sickle cell anemia causes organ damage and pain. It also increases the risk of infection. °Follow these instructions at home: °· Drink enough fluid to keep your pee (urine) clear or pale yellow. Drink more in hot weather and during exercise. °· Do not smoke. Smoking lowers oxygen levels in the blood. °· Only take over-the-counter or prescription medicines as told by your doctor. °· Take antibiotic medicines as told by your doctor. Make sure you finish them even if you start to feel better. °· Take supplements as told by your doctor. °· Consider wearing a medical alert bracelet. This tells anyone caring for you in an emergency of your condition. °· When traveling, keep your medical information, doctors' names, and the medicines you take with you at all times. °· If you have a fever, do not take fever medicines right away. This could cover up a problem. Tell your doctor. °· Keep all follow-up visits with your doctor. Sickle cell anemia requires regular medical care. °Contact a doctor if: °You have a fever. °Get help right away if: °· You feel dizzy or faint. °· You have new belly (abdominal) pain, especially on the left side near the stomach area. °· You have a lasting, often uncomfortable and painful erection of the penis (priapism). If it is not treated right away, you will become unable to have sex (impotence). °· You have numbness in your arms or legs or you have a hard time moving them. °· You have a hard time talking. °· You have a fever or lasting symptoms for more than 2-3 days. °· You have a fever and your symptoms suddenly get  worse. °· You have signs or symptoms of infection. These include: °? Chills. °? Being more tired than normal (lethargy). °? Irritability. °? Poor eating. °? Throwing up (vomiting). °· You have pain that is not helped with medicine. °· You have shortness of breath. °· You have pain in your chest. °· You are coughing up pus-like or bloody mucus. °· You have a stiff neck. °· Your feet or hands swell or have pain. °· Your belly looks bloated. °· Your joints hurt. °This information is not intended to replace advice given to you by your health care provider. Make sure you discuss any questions you have with your health care provider. °Document Released: 03/21/2013 Document Revised: 11/06/2015 Document Reviewed: 01/10/2013 °Elsevier Interactive Patient Education © 2017 Else[employer] Inc. ° °

## 2018-02-17 ENCOUNTER — Ambulatory Visit: Payer: 59 | Admitting: Family Medicine

## 2018-02-20 ENCOUNTER — Ambulatory Visit: Payer: 59 | Admitting: Family Medicine

## 2018-02-23 NOTE — Progress Notes (Signed)
  [facility]   Progress Note: General Provider: Mike GipAndre Whisper Kurka, FNP  SUBJECTIVE:   William JarvisGeovanni D Jennings is a 27 y.o. male who  has a past medical history of Chronic prescription opiate use, Sickle cell anemia (HCC), and Sickle cell disease (HCC).. Patient presents today for Follow-up (fmla paper work ) Patient presents for paperwork evaluation.Hx of sickle cell and needs FMLA paper work for his current employer.  No problems or concerns today Review of Systems  Constitutional: Negative.   Respiratory: Negative.   Cardiovascular: Negative.   Neurological: Negative.   Psychiatric/Behavioral: Negative.   All other systems reviewed and are negative.    OBJECTIVE: BP 123/71 (BP Location: Left Arm, Patient Position: Sitting, Cuff Size: Normal)   Pulse 74   Temp 98.2 F (36.8 C) (Oral)   Resp 16   Ht 5\' 8"  (1.727 m)   Wt 148 lb (67.1 kg)   SpO2 100%   BMI 22.50 kg/m   Physical Exam  Constitutional: He is oriented to person, place, and time. He appears well-developed and well-nourished. No distress.  Neurological: He is oriented to person, place, and time.  Psychiatric: He has a normal mood and affect. His behavior is normal. Judgment and thought content normal.  Nursing note and vitals reviewed.   ASSESSMENT/PLAN:  1. Sickle cell anemia with pain (HCC) Paperwork filled out. No medication changes      The patient was given clear instructions to go to ER or return to medical center if symptoms do not improve, worsen or new problems develop. The patient verbalized understanding and agreed with plan of care.   Ms. Freda Jacksonndr L. Riley Lamouglas, FNP-BC [facility] [address]  [place], [place] [address] [phone]     This note has been created with Dragon speech recognition software and smart phrase technology. Any transcriptional errors are unintentional.

## 2018-03-16 ENCOUNTER — Ambulatory Visit: Payer: 59 | Admitting: Family Medicine

## 2018-03-28 ENCOUNTER — Other Ambulatory Visit: Payer: Self-pay

## 2018-03-28 ENCOUNTER — Emergency Department (HOSPITAL_BASED_OUTPATIENT_CLINIC_OR_DEPARTMENT_OTHER)
Admission: EM | Admit: 2018-03-28 | Discharge: 2018-03-29 | Disposition: A | Discharge status: Home / Self Care | Payer: Medicaid Other | Attending: Emergency Medicine | Admitting: Emergency Medicine

## 2018-03-28 ENCOUNTER — Encounter (HOSPITAL_BASED_OUTPATIENT_CLINIC_OR_DEPARTMENT_OTHER): Payer: Self-pay

## 2018-03-28 DIAGNOSIS — D5700 Hb-SS disease with crisis, unspecified: Secondary | ICD-10-CM | POA: Diagnosis not present

## 2018-03-28 DIAGNOSIS — Z87891 Personal history of nicotine dependence: Secondary | ICD-10-CM | POA: Insufficient documentation

## 2018-03-28 DIAGNOSIS — M79604 Pain in right leg: Secondary | ICD-10-CM | POA: Diagnosis present

## 2018-03-28 NOTE — ED Provider Notes (Signed)
[facility] Provider Note: Lowella Dell, MD, [name]  CSN: [id number] MRN: [id number] ARRIVAL: 03/28/18 at 2339 ROOM: MH02/MH02   CHIEF COMPLAINT  Sickle Cell Pain Crisis   HISTORY OF PRESENT ILLNESS  03/28/18 11:58 PM William Jennings is a 27 y.o. male with a history of sickle cell disease.  He does not have frequent visits to the ED.  He is here with 2-day history of pain consistent with previous sickle pain.  The pain is in his right leg, left arm, back and bilateral lower ribs.  He rates his pain as a 9 out of 10 and it has not been relieved with his regular hydrocodone.  He denies fever, chills, shortness of breath, nausea, vomiting or diarrhea.   Past Medical History:  Diagnosis Date  . Chronic prescription opiate use   . Sickle cell anemia (HCC)   . Sickle cell disease (HCC)     Past Surgical History:  Procedure Laterality Date  . CHOLECYSTECTOMY      No family history on file.  Social History   Tobacco Use  . Smoking status: Former Games developer  . Smokeless tobacco: Never Used  Substance Use Topics  . Alcohol use: Yes    Comment: occ  . Drug use: No    Prior to Admission medications   Medication Sig Start Date End Date Taking? Authorizing Provider  ibuprofen (ADVIL,MOTRIN) 800 MG tablet Take 1 tablet (800 mg total) by mouth every 8 (eight) hours as needed. 01/20/18   Mike Gip, FNP    Allergies Shellfish allergy   REVIEW OF SYSTEMS  Negative except as noted here or in the History of Present Illness.   PHYSICAL EXAMINATION  Initial Vital Signs Blood pressure 123/80, pulse 75, temperature 98.2 F (36.8 C), temperature source Oral, resp. rate 16, height 5\' 8"  (1.727 m), weight 68.9 kg, SpO2 100 %.  Examination General: Well-developed, well-nourished male in no acute distress; appearance consistent with age of record HENT: normocephalic; atraumatic Eyes: pupils equal, round and reactive to light; extraocular muscles intact Neck: supple    Heart: regular rate and rhythm Lungs: clear to auscultation bilaterally Abdomen: soft; nondistended; nontender; no masses or hepatosplenomegaly; bowel sounds present Extremities: No deformity; full range of motion; pulses normal Neurologic: Awake, alert and oriented; motor function intact in all extremities and symmetric; no facial droop Skin: Warm and dry Psychiatric: Normal mood and affect   RESULTS  Summary of this visit's results, reviewed by myself:   EKG Interpretation  Date/Time:    Ventricular Rate:    PR Interval:    QRS Duration:   QT Interval:    QTC Calculation:   R Axis:     Text Interpretation:        Laboratory Studies: Results for orders placed or performed during the hospital encounter of 03/28/18 (from the past 24 hour(s))  CBC with Differential/Platelet     Status: Abnormal   Collection Time: 03/29/18 12:04 AM  Result Value Ref Range   WBC 9.8 4.0 - 10.5 K/uL   RBC 4.20 (L) 4.22 - 5.81 MIL/uL   Hemoglobin 12.0 (L) 13.0 - 17.0 g/dL   HCT 78.2 (L) 95.6 - 21.3 %   MCV 79.0 (L) 80.0 - 100.0 fL   MCH 28.6 26.0 - 34.0 pg   MCHC 36.1 (H) 30.0 - 36.0 g/dL   RDW 08.6 57.8 - 46.9 %   Platelets 152 150 - 400 K/uL   nRBC 1.7 (H) 0.0 - 0.2 %   Neutrophils  Relative % 46 %   Neutro Abs 4.5 1.7 - 7.7 K/uL   Lymphocytes Relative 43 %   Lymphs Abs 4.2 (H) 0.7 - 4.0 K/uL   Monocytes Relative 9 %   Monocytes Absolute 0.9 0.1 - 1.0 K/uL   Eosinophils Relative 1 %   Eosinophils Absolute 0.1 0.0 - 0.5 K/uL   Basophils Relative 0 %   Basophils Absolute 0.0 0.0 - 0.1 K/uL   Immature Granulocytes 1 %   Abs Immature Granulocytes 0.07 0.00 - 0.07 K/uL   Polychromasia PRESENT    Sickle Cells MARKED    Target Cells PRESENT   Basic metabolic panel     Status: Abnormal   Collection Time: 03/29/18 12:04 AM  Result Value Ref Range   Sodium 137 135 - 145 mmol/L   Potassium 3.9 3.5 - 5.1 mmol/L   Chloride 105 98 - 111 mmol/L   CO2 26 22 - 32 mmol/L   Glucose, Bld 130  (H) 70 - 99 mg/dL   BUN 10 6 - 20 mg/dL   Creatinine, Ser 1.61 0.61 - 1.24 mg/dL   Calcium 8.7 (L) 8.9 - 10.3 mg/dL   GFR calc non Af Amer >60 >60 mL/min   GFR calc Af Amer >60 >60 mL/min   Anion gap 6 5 - 15   Imaging Studies: No results found.  ED COURSE and MDM  Nursing notes and initial vitals signs, including pulse oximetry, reviewed.  Vitals:   03/28/18 2346 03/28/18 2347  BP:  123/80  Pulse:  75  Resp:  16  Temp:  98.2 F (36.8 C)  TempSrc:  Oral  SpO2:  100%  Weight: 68.9 kg   Height: 5\' 8"  (1.727 m)    1:20 AM Patient feeling better, ready to go home after third milligram of Dilaudid.  PROCEDURES    ED DIAGNOSES     ICD-10-CM   1. Sickle cell pain crisis (HCC) D57.00        Katelynne Revak, MD 03/29/18 0120

## 2018-03-28 NOTE — ED Triage Notes (Addendum)
Pt c/o his sickle cell pain, not relieved by his at home vicodin

## 2018-03-29 LAB — CBC WITH DIFFERENTIAL/PLATELET
ABS IMMATURE GRANULOCYTES: 0.07 10*3/uL (ref 0.00–0.07)
BASOS ABS: 0 10*3/uL (ref 0.0–0.1)
Basophils Relative: 0 %
Eosinophils Absolute: 0.1 10*3/uL (ref 0.0–0.5)
Eosinophils Relative: 1 %
HCT: 33.2 % — ABNORMAL LOW (ref 39.0–52.0)
Hemoglobin: 12 g/dL — ABNORMAL LOW (ref 13.0–17.0)
Immature Granulocytes: 1 %
LYMPHS ABS: 4.2 10*3/uL — AB (ref 0.7–4.0)
LYMPHS PCT: 43 %
MCH: 28.6 pg (ref 26.0–34.0)
MCHC: 36.1 g/dL — AB (ref 30.0–36.0)
MCV: 79 fL — ABNORMAL LOW (ref 80.0–100.0)
Monocytes Absolute: 0.9 10*3/uL (ref 0.1–1.0)
Monocytes Relative: 9 %
NEUTROS ABS: 4.5 10*3/uL (ref 1.7–7.7)
Neutrophils Relative %: 46 %
PLATELETS: 152 10*3/uL (ref 150–400)
RBC: 4.2 MIL/uL — ABNORMAL LOW (ref 4.22–5.81)
RDW: 14.7 % (ref 11.5–15.5)
WBC: 9.8 10*3/uL (ref 4.0–10.5)
nRBC: 1.7 % — ABNORMAL HIGH (ref 0.0–0.2)

## 2018-03-29 LAB — BASIC METABOLIC PANEL
Anion gap: 6 (ref 5–15)
BUN: 10 mg/dL (ref 6–20)
CALCIUM: 8.7 mg/dL — AB (ref 8.9–10.3)
CO2: 26 mmol/L (ref 22–32)
CREATININE: 0.97 mg/dL (ref 0.61–1.24)
Chloride: 105 mmol/L (ref 98–111)
Glucose, Bld: 130 mg/dL — ABNORMAL HIGH (ref 70–99)
Potassium: 3.9 mmol/L (ref 3.5–5.1)
SODIUM: 137 mmol/L (ref 135–145)

## 2018-03-29 LAB — RETICULOCYTES
Immature Retic Fract: 32.9 % — ABNORMAL HIGH (ref 2.3–15.9)
RBC.: 4.21 MIL/uL — ABNORMAL LOW (ref 4.22–5.81)
RETIC CT PCT: 4.2 % — AB (ref 0.4–3.1)
Retic Count, Absolute: 175.6 10*3/uL (ref 19.0–186.0)

## 2018-03-29 MED ORDER — HYDROMORPHONE HCL 1 MG/ML IJ SOLN
1.0000 mg | Freq: Once | INTRAMUSCULAR | Status: AC
  Administered 2018-03-29: 1 mg via INTRAVENOUS

## 2018-03-29 MED ORDER — HYDROMORPHONE HCL 1 MG/ML IJ SOLN
1.0000 mg | Freq: Once | INTRAMUSCULAR | Status: AC
  Administered 2018-03-29: 1 mg via INTRAVENOUS
  Filled 2018-03-29: qty 1

## 2018-03-29 MED ORDER — HYDROMORPHONE HCL 1 MG/ML IJ SOLN
INTRAMUSCULAR | Status: AC
  Filled 2018-03-29: qty 1

## 2018-03-29 MED ORDER — SODIUM CHLORIDE 0.9 % IV BOLUS
1000.0000 mL | Freq: Once | INTRAVENOUS | Status: AC
  Administered 2018-03-29: 1000 mL via INTRAVENOUS

## 2018-03-29 MED ORDER — ONDANSETRON HCL 4 MG/2ML IJ SOLN
4.0000 mg | Freq: Once | INTRAMUSCULAR | Status: AC
  Administered 2018-03-29: 4 mg via INTRAVENOUS
  Filled 2018-03-29: qty 2

## 2018-03-29 NOTE — ED Notes (Signed)
Pt has sickle cell and is c/o pain to his leg, arm and rib cage  Pt states pain started 2 days ago and has progressively gotten worse

## 2018-05-28 ENCOUNTER — Encounter (HOSPITAL_BASED_OUTPATIENT_CLINIC_OR_DEPARTMENT_OTHER): Payer: Self-pay | Admitting: Adult Health

## 2018-05-28 ENCOUNTER — Other Ambulatory Visit: Payer: Self-pay

## 2018-05-28 ENCOUNTER — Emergency Department (HOSPITAL_BASED_OUTPATIENT_CLINIC_OR_DEPARTMENT_OTHER)
Admission: EM | Admit: 2018-05-28 | Discharge: 2018-05-28 | Disposition: A | Discharge status: Home / Self Care | Payer: Medicaid Other | Attending: Emergency Medicine | Admitting: Emergency Medicine

## 2018-05-28 DIAGNOSIS — D5700 Hb-SS disease with crisis, unspecified: Secondary | ICD-10-CM

## 2018-05-28 DIAGNOSIS — Z79899 Other long term (current) drug therapy: Secondary | ICD-10-CM | POA: Insufficient documentation

## 2018-05-28 DIAGNOSIS — Z87891 Personal history of nicotine dependence: Secondary | ICD-10-CM | POA: Diagnosis not present

## 2018-05-28 LAB — CBC WITH DIFFERENTIAL/PLATELET
ABS IMMATURE GRANULOCYTES: 0.06 10*3/uL (ref 0.00–0.07)
BASOS ABS: 0 10*3/uL (ref 0.0–0.1)
Basophils Relative: 0 %
Eosinophils Absolute: 0 10*3/uL (ref 0.0–0.5)
Eosinophils Relative: 0 %
HCT: 34.7 % — ABNORMAL LOW (ref 39.0–52.0)
HEMOGLOBIN: 12.3 g/dL — AB (ref 13.0–17.0)
IMMATURE GRANULOCYTES: 1 %
LYMPHS ABS: 2.5 10*3/uL (ref 0.7–4.0)
LYMPHS PCT: 31 %
MCH: 27.1 pg (ref 26.0–34.0)
MCHC: 35.4 g/dL (ref 30.0–36.0)
MCV: 76.4 fL — ABNORMAL LOW (ref 80.0–100.0)
Monocytes Absolute: 0.8 10*3/uL (ref 0.1–1.0)
Monocytes Relative: 11 %
NEUTROS PCT: 57 %
NRBC: 0.8 % — AB (ref 0.0–0.2)
Neutro Abs: 4.5 10*3/uL (ref 1.7–7.7)
Platelets: 161 10*3/uL (ref 150–400)
RBC: 4.54 MIL/uL (ref 4.22–5.81)
RDW: 14.7 % (ref 11.5–15.5)
WBC: 7.9 10*3/uL (ref 4.0–10.5)

## 2018-05-28 LAB — BASIC METABOLIC PANEL
Anion gap: 6 (ref 5–15)
BUN: 7 mg/dL (ref 6–20)
CALCIUM: 8.9 mg/dL (ref 8.9–10.3)
CO2: 23 mmol/L (ref 22–32)
CREATININE: 0.8 mg/dL (ref 0.61–1.24)
Chloride: 109 mmol/L (ref 98–111)
GFR calc non Af Amer: >60 mL/min (ref >60)
Glucose, Bld: 92 mg/dL (ref 70–99)
Potassium: 3.6 mmol/L (ref 3.5–5.1)
SODIUM: 138 mmol/L (ref 135–145)

## 2018-05-28 LAB — RETICULOCYTES
IMMATURE RETIC FRACT: 22.8 % — AB (ref 2.3–15.9)
RBC.: 4.55 MIL/uL (ref 4.22–5.81)
RETIC COUNT ABSOLUTE: 153.8 10*3/uL (ref 19.0–186.0)
Retic Ct Pct: 3.4 % — ABNORMAL HIGH (ref 0.4–3.1)

## 2018-05-28 MED ORDER — DIPHENHYDRAMINE HCL 50 MG/ML IJ SOLN
25.0000 mg | Freq: Once | INTRAMUSCULAR | Status: AC
  Administered 2018-05-28: 25 mg via INTRAVENOUS
  Filled 2018-05-28: qty 1

## 2018-05-28 MED ORDER — HYDROMORPHONE HCL 1 MG/ML IJ SOLN
1.0000 mg | Freq: Once | INTRAMUSCULAR | Status: AC
  Administered 2018-05-28: 1 mg via INTRAVENOUS
  Filled 2018-05-28: qty 1

## 2018-05-28 MED ORDER — HYDROMORPHONE HCL 1 MG/ML IJ SOLN: 0.5000 mg | INTRAMUSCULAR | Status: AC

## 2018-05-28 MED ORDER — HYDROMORPHONE HCL 1 MG/ML IJ SOLN
1.0000 mg | INTRAMUSCULAR | Status: AC
  Administered 2018-05-28: 1 mg via INTRAVENOUS
  Filled 2018-05-28: qty 1

## 2018-05-28 MED ORDER — HYDROCODONE-ACETAMINOPHEN 5-325 MG PO TABS: 1.0000 | ORAL_TABLET | Freq: Four times a day (QID) | ORAL | 0 refills | Status: DC | PRN

## 2018-05-28 MED ORDER — KETOROLAC TROMETHAMINE 15 MG/ML IJ SOLN
15.0000 mg | Freq: Once | INTRAMUSCULAR | Status: AC
  Administered 2018-05-28: 15 mg via INTRAVENOUS
  Filled 2018-05-28: qty 1

## 2018-05-28 MED ORDER — HYDROMORPHONE HCL 1 MG/ML IJ SOLN: 1.0000 mg | INTRAMUSCULAR | Status: AC

## 2018-05-28 MED ORDER — ONDANSETRON HCL 4 MG/2ML IJ SOLN: 4.0000 mg | INTRAMUSCULAR | Status: DC | PRN

## 2018-05-28 NOTE — ED Notes (Signed)
Pt states pain is manageable at this time. 

## 2018-05-28 NOTE — Discharge Instructions (Signed)
Evaluated today for sickle cell pain.  Your pain improved in the department.  Please follow-up with the sickle cell clinic tomorrow for reevaluation.  Return to the ED for any worsening symptoms.

## 2018-05-28 NOTE — ED Triage Notes (Signed)
PResents with sickle cell crisis that began 2 days after taking care of his daughters for the past few days because they have been sick. HIs pain is in his left knee. He has been taking Ibuprofen and it has not been helping.

## 2018-05-28 NOTE — ED Provider Notes (Signed)
[facility] EMERGENCY DEPARTMENT Provider Note   CSN: [id number] Arrival date & time: 05/28/18  1214   History   Chief Complaint Chief Complaint  Patient presents with  . Sickle Cell Pain Crisis    HPI William Jennings is a 27 y.o. male with past medical history significant for sickle cell who presents for evaluation of pain.  Patient states he has not had his normal hydrocodone which he takes for his sickle cell.  Patient states he has developed left thigh pain as well as left knee pain.  States he does have pain to his right knee and right thigh as well, however this pain is not as severe.  States he has been taking Ibuprofen for symptoms.  Pain a 10/10.  Pain is constant.  Pain does not radiate.  Denies additional alleviating or aggravating symptoms.  Patient states his pain is typical of his sickle cell crisis pain.  Denies recent injuries or trauma.  Denies fever, chills, chest pain, shortness of breath, abdominal pain, diarrhea, constipation, dysuria, swelling to lower extremity, warmth, redness to lower extremities.  Has been able to ambulate without difficulty.  Patient states he was trying to hold off on pain medicine until Monday when the sickle cell center open, however states the pain was so intense he was not able to sleep yesterday evening.  History obtained from patient.  No interpreter was used.  HPI  Past Medical History:  Diagnosis Date  . Chronic prescription opiate use   . Sickle cell anemia (HCC)   . Sickle cell disease [facility])     Patient Active Problem List   Diagnosis Date Noted  . Sickle cell anemia with pain (HCC) 04/05/2016  . Acute chest syndrome due to sickle cell crisis (HCC)   . Sickle cell anemia with crisis (HCC) 02/21/2014  . Sickle cell anemia (HCC) 08/10/2013  . Chest pain 08/10/2013  . Dehydration 08/08/2013  . Sickle cell crisis (HCC) 08/06/2013  . 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 Hearing loss of left ear  01/22/2013    Past Surgical History:  Procedure Laterality Date  . CHOLECYSTECTOMY          Home Medications    Prior to Admission medications   Medication Sig Start Date End Date Taking? Authorizing Provider  HYDROcodone-acetaminophen (NORCO/VICODIN) 5-325 MG tablet Take 1-2 tablets by mouth every 6 (six) hours as needed for up to 3 days. 05/28/18 05/31/18  Ansen Sayegh A, PA-C  ibuprofen (ADVIL,MOTRIN) 800 MG tablet Take 1 tablet (800 mg total) by mouth every 8 (eight) hours as needed. 01/20/18   Mike Gip, FNP    Family History History reviewed. No pertinent family history.  Social History Social History   Tobacco Use  . Smoking status: Former Games developer  . Smokeless tobacco: Never Used  Substance Use Topics  . Alcohol use: Yes    Comment: occ  . Drug use: No     Allergies   Shellfish allergy   Review of Systems Review of Systems  Constitutional: Negative.   Respiratory: Negative.  Negative for cough and shortness of breath.   Cardiovascular: Negative for chest pain, palpitations and leg swelling.  Gastrointestinal: Negative.   Genitourinary: Negative.   Musculoskeletal: Negative for back pain, neck pain and neck stiffness.       Diffuse lower extremity pain  Skin: Negative.   Neurological: Negative.      Physical Exam Updated Vital Signs BP 116/68 (BP Location: Left Arm)   Pulse 72  Temp 98.1 F (36.7 C) (Oral)   Resp 16   Ht 5\' 8"  (1.727 m)   Wt 64.9 kg   SpO2 100%   BMI 21.74 kg/m   Physical Exam Vitals signs and nursing note reviewed.  Constitutional:      General: He is not in acute distress.    Appearance: He is well-developed. He is not ill-appearing, toxic-appearing or diaphoretic.  HENT:     Head: Atraumatic.  Eyes:     Pupils: Pupils are equal, round, and reactive to light.  Neck:     Musculoskeletal: Normal range of motion and neck supple.  Cardiovascular:     Rate and Rhythm: Normal rate and regular rhythm.     Pulses:  Normal pulses.     Heart sounds: Normal heart sounds. No murmur. No friction rub. No gallop.   Pulmonary:     Effort: Pulmonary effort is normal. No respiratory distress.     Breath sounds: Normal breath sounds. No stridor. No wheezing, rhonchi or rales.  Chest:     Chest wall: No tenderness.  Abdominal:     General: There is no distension.     Palpations: Abdomen is soft.     Tenderness: There is no abdominal tenderness.  Musculoskeletal: Normal range of motion.        General: No swelling, deformity or signs of injury.     Right lower leg: No edema.     Left lower leg: No edema.     Comments: Diffuse lower extremity tenderness.  No focal tenderness.  No evidence of edema, erythema or warmth of bilateral lower extremity.  No calf tenderness.  Full range of motion bilateral lower extremity.  5/5 strength bilateral lower extremity. Able to ambulate without difficulty in department.  Skin:    General: Skin is warm and dry.     Coloration: Skin is not pale.     Findings: No bruising, erythema, lesion or rash.  Neurological:     Mental Status: He is alert.      ED Treatments / Results  Labs (all labs ordered are listed, but only abnormal results are displayed) Labs Reviewed  CBC WITH DIFFERENTIAL/PLATELET - Abnormal; Notable for the following components:      Result Value   Hemoglobin 12.3 (*)    HCT 34.7 (*)    MCV 76.4 (*)    nRBC 0.8 (*)    All other components within normal limits  RETICULOCYTES - Abnormal; Notable for the following components:   Retic Ct Pct 3.4 (*)    Immature Retic Fract 22.8 (*)    All other components within normal limits  BASIC METABOLIC PANEL    EKG None  Radiology No results found.  Procedures Procedures (including critical care time)  Medications Ordered in ED Medications  HYDROmorphone (DILAUDID) injection 0.5 mg (has no administration in time range)    Or  HYDROmorphone (DILAUDID) injection 0.5 mg (has no administration in time  range)  ondansetron (ZOFRAN) injection 4 mg (has no administration in time range)  HYDROmorphone (DILAUDID) injection 1 mg (1 mg Intravenous Given 05/28/18 1256)    Or  HYDROmorphone (DILAUDID) injection 1 mg ( Subcutaneous See Alternative 05/28/18 1256)  HYDROmorphone (DILAUDID) injection 1 mg (1 mg Intravenous Given 05/28/18 1402)    Or  HYDROmorphone (DILAUDID) injection 1 mg ( Subcutaneous See Alternative 05/28/18 1402)  diphenhydrAMINE (BENADRYL) injection 25 mg (25 mg Intravenous Given 05/28/18 1252)  ketorolac (TORADOL) 15 MG/ML injection 15 mg (15 mg Intravenous Given  05/28/18 1512)  HYDROmorphone (DILAUDID) injection 1 mg (1 mg Intravenous Given 05/28/18 1512)     Initial Impression / Assessment and Plan / ED Course  I have reviewed the triage vital signs and the nursing notes.  Pertinent labs & imaging results that were available during my care of the patient were reviewed by me and considered in my medical decision making (see chart for details).  27 year old male appears otherwise well presents for evaluation of sickle cell crisis.  Pain consistent with prior sickle cell crises.  Afebrile, nonseptic, non-ill-appearing.  Denies chest pain or shortness of breath.  Low suspicion for acute chest syndrome.  Labs are stable compared to previous visits.  ED sickle cell protocol was utilized.  Pain has been improved with 3 doses of pain medicine. Patient states he does not want admission to the sickle cell clinic. Normal musculoskeletal exam, low suspicion for DVT causing pain. Compartment soft without edema, erythema or warmth. Neurovascularly intact.  Patient requesting to DC home and he will follow-up with his sickle cell center tomorrow morning for reevaluation.  Discussed with the patient hydration as well as pain management at home.  Does not have home oxycodone.  Will DC home with short course and have him follow-up tomorrow morning with the sickle cell clinic for additional medication.   Patient has not filled his Norco prescription since August of this year according to the narcotics database. Low suspicion for emergent pathology causing patient's symptoms at this time.  Patient  is hemodynamically stable and appropriate for DC home at this time.  Discussed return precautions. Patient has family driving him home. Patient voiced understanding and is agreeable for follow-up.    Final Clinical Impressions(s) / ED Diagnoses   Final diagnoses:  Sickle cell anemia with pain [facility](HCC)    ED Discharge Orders         Ordered    HYDROcodone-acetaminophen (NORCO/VICODIN) 5-325 MG tablet  Every 6 hours PRN     05/28/18 1630           Gena Laski A, PA-C 05/28/18 1639    Rolan BuccoBelfi, Melanie, MD 05/29/18 [phone]

## 2018-05-29 ENCOUNTER — Encounter (HOSPITAL_COMMUNITY): Payer: Self-pay | Admitting: *Deleted

## 2018-05-29 ENCOUNTER — Non-Acute Institutional Stay (HOSPITAL_COMMUNITY)
Admission: AD | Admit: 2018-05-29 | Discharge: 2018-05-29 | Disposition: A | Discharge status: Home / Self Care | Payer: Medicaid Other | Source: Ambulatory Visit | Attending: Internal Medicine | Admitting: Internal Medicine

## 2018-05-29 ENCOUNTER — Telehealth (HOSPITAL_COMMUNITY): Payer: Self-pay | Admitting: General Practice

## 2018-05-29 DIAGNOSIS — D5700 Hb-SS disease with crisis, unspecified: Secondary | ICD-10-CM | POA: Diagnosis present

## 2018-05-29 LAB — COMPREHENSIVE METABOLIC PANEL
ALT: 51 U/L — ABNORMAL HIGH (ref 0–44)
AST: 70 U/L — ABNORMAL HIGH (ref 15–41)
Albumin: 4.5 g/dL (ref 3.5–5.0)
Alkaline Phosphatase: 112 U/L (ref 38–126)
Anion gap: 11 (ref 5–15)
BUN: 6 mg/dL (ref 6–20)
CO2: 23 mmol/L (ref 22–32)
Calcium: 9.1 mg/dL (ref 8.9–10.3)
Chloride: 106 mmol/L (ref 98–111)
Creatinine, Ser: 0.88 mg/dL (ref 0.61–1.24)
GFR calc Af Amer: >60 mL/min (ref >60)
Glucose, Bld: 87 mg/dL (ref 70–99)
Potassium: 3.6 mmol/L (ref 3.5–5.1)
Sodium: 140 mmol/L (ref 135–145)
Total Bilirubin: 2 mg/dL — ABNORMAL HIGH (ref 0.3–1.2)
Total Protein: 7.8 g/dL (ref 6.5–8.1)

## 2018-05-29 LAB — CBC WITH DIFFERENTIAL/PLATELET
Abs Immature Granulocytes: 0.04 10*3/uL (ref 0.00–0.07)
BASOS PCT: 0 %
Basophils Absolute: 0 10*3/uL (ref 0.0–0.1)
Eosinophils Absolute: 0 10*3/uL (ref 0.0–0.5)
Eosinophils Relative: 0 %
HCT: 36.1 % — ABNORMAL LOW (ref 39.0–52.0)
Hemoglobin: 12.9 g/dL — ABNORMAL LOW (ref 13.0–17.0)
IMMATURE GRANULOCYTES: 1 %
Lymphocytes Relative: 28 %
Lymphs Abs: 2.4 10*3/uL (ref 0.7–4.0)
MCH: 27.6 pg (ref 26.0–34.0)
MCHC: 35.7 g/dL (ref 30.0–36.0)
MCV: 77.1 fL — ABNORMAL LOW (ref 80.0–100.0)
Monocytes Absolute: 1.1 10*3/uL — ABNORMAL HIGH (ref 0.1–1.0)
Monocytes Relative: 14 %
NEUTROS PCT: 57 %
Neutro Abs: 4.8 10*3/uL (ref 1.7–7.7)
Platelets: 174 10*3/uL (ref 150–400)
RBC: 4.68 MIL/uL (ref 4.22–5.81)
RDW: 14.7 % (ref 11.5–15.5)
WBC: 8.4 10*3/uL (ref 4.0–10.5)
nRBC: 0.7 % — ABNORMAL HIGH (ref 0.0–0.2)

## 2018-05-29 MED ORDER — HYDROCODONE-ACETAMINOPHEN 5-325 MG PO TABS: 1.0000 | ORAL_TABLET | Freq: Four times a day (QID) | ORAL | 0 refills | Status: DC | PRN

## 2018-05-29 MED ORDER — ACETAMINOPHEN 325 MG PO TABS
650.0000 mg | ORAL_TABLET | Freq: Once | ORAL | Status: AC
  Administered 2018-05-29: 650 mg via ORAL
  Filled 2018-05-29: qty 2

## 2018-05-29 MED ORDER — NALOXONE HCL 0.4 MG/ML IJ SOLN: 0.4000 mg | INTRAMUSCULAR | Status: DC | PRN

## 2018-05-29 MED ORDER — HYDROMORPHONE 1 MG/ML IV SOLN
INTRAVENOUS | Status: DC
  Administered 2018-05-29: 30 mg via INTRAVENOUS
  Administered 2018-05-29: 8 mg via INTRAVENOUS
  Filled 2018-05-29: qty 30

## 2018-05-29 MED ORDER — DEXTROSE-NACL 5-0.45 % IV SOLN
INTRAVENOUS | Status: DC
  Administered 2018-05-29: 10:00:00 via INTRAVENOUS

## 2018-05-29 MED ORDER — DIPHENHYDRAMINE HCL 25 MG PO CAPS
25.0000 mg | ORAL_CAPSULE | ORAL | Status: DC | PRN
  Administered 2018-05-29: 25 mg via ORAL
  Filled 2018-05-29: qty 1

## 2018-05-29 MED ORDER — KETOROLAC TROMETHAMINE 15 MG/ML IJ SOLN
15.0000 mg | Freq: Once | INTRAMUSCULAR | Status: AC
  Administered 2018-05-29: 15 mg via INTRAVENOUS
  Filled 2018-05-29: qty 1

## 2018-05-29 MED ORDER — SODIUM CHLORIDE 0.9% FLUSH: 9.0000 mL | INTRAVENOUS | Status: DC | PRN

## 2018-05-29 MED ORDER — ONDANSETRON HCL 4 MG/2ML IJ SOLN: 4.0000 mg | Freq: Four times a day (QID) | INTRAMUSCULAR | Status: DC | PRN

## 2018-05-29 MED ORDER — IBUPROFEN 800 MG PO TABS: 800.0000 mg | ORAL_TABLET | Freq: Three times a day (TID) | ORAL | 0 refills | Status: DC | PRN

## 2018-05-29 NOTE — Discharge Summary (Signed)
[facility] Dis[facility]   Patient ID: William Jennings MRN: [id number] DOB/AGE: 29/24/1992 29 y.o.  Admit date: 05/29/2018 Discharge date: 05/29/2018  Primary Care Physician:  Mike Gip, FNP  Admission Diagnoses:  Active Problems:   Sickle cell pain crisis [facility])   Discharge Medications:  Allergies as of 05/29/2018      Reactions   Shellfish Allergy Anaphylaxis      Medication List    TAKE these medications   HYDROcodone-acetaminophen 5-325 MG tablet Commonly known as:  NORCO/VICODIN Take 1 tablet by mouth every 6 (six) hours as needed. What changed:  how much to take   ibuprofen 800 MG tablet Commonly known as:  ADVIL,MOTRIN Take 1 tablet (800 mg total) by mouth every 8 (eight) hours as needed.        Consults:  None  Significant Diagnostic Studies:  No results found.   [facility] Course: A 27 year old male with a medical history significant for sickle cell anemia, hemoglobin [place] was admitted to sickle cell day infusion center for pain management and extended observation. All laboratory values reviewed, consistent with baseline Patient hemodynamically stable throughout admission  Hypotonic IV fluids initiated for cellular rehydration at 125 mL/h Toradol 15 mg IV x1 for inflammation PCA Dilaudid per weight-based protocol for pain management.  Patient used a total of 8 mg with 26 demands and 15 delivered.  Pain intensity decreased to 5/10. Patient alert, oriented, and ambulating. Patient will discharge home with family in stable condition.  Discharge instructions. Patient out of home medications.  Vicodin 5-325 mg #15 sent to pharmacy.  Patient advised to follow-up in primary care in 1 week to repeat labs and for pain management. Continue to hydrate with 64 ounces of water per day Avoid all stressors that precipitate sickle cell crisis   The patient was given clear instructions to go to ER or return to medical center  if symptoms do not improve, worsen or new problems develop. The patient verbalized understanding.    Physical Exam at Discharge:  BP (!) 111/57 (BP Location: Left Arm)   Pulse 64   Temp 98.6 F (37 C) (Oral)   Resp 12   SpO2 99%  Physical Exam Constitutional:      Appearance: Normal appearance.  HENT:     Head: Normocephalic.     Nose: Nose normal.     Mouth/Throat:     Mouth: Mucous membranes are moist.  Neck:     Musculoskeletal: Normal range of motion.  Cardiovascular:     Rate and Rhythm: Normal rate.     Pulses: Normal pulses.     Heart sounds: Normal heart sounds.  Pulmonary:     Effort: Pulmonary effort is normal.  Abdominal:     General: Abdomen is flat.  Musculoskeletal: Normal range of motion.  Skin:    General: Skin is warm and dry.  Neurological:     General: No focal deficit present.     Mental Status: He is alert. Mental status is at baseline.  Psychiatric:        Mood and Affect: Mood normal.     Disposition at Discharge: Discharge disposition: 01-Home or Self Care       Discharge Orders: Discharge Instructions    Discharge patient   Complete by:  As directed    Discharge disposition:  01-Home or Self Care   Discharge patient date:  05/29/2018      Condition at Discharge:   Stable  Time spent  on Discharge:  20 minutes  Signed:   Nolon NationsLachina Moore Sumayyah Custodio  APRN, MSN, FNP-C [facility] [address]  [place], [place] [address] [phone]  05/29/2018, 3:53 PM

## 2018-05-29 NOTE — Telephone Encounter (Signed)
Patient called, complained of pain in the left leg that he rated as 9/10. Denied chest pain, fever, diarrhea, abdominal pain, nausea/vomitting. Admitted to having means of transportation without driving self after treatment ("sister"). Last took Ibuprofen  800 mg at 05:00 today. "Ran out' of other pain prescription medications  having been in crisis for "72 hrs".  Provider notified; per provider, patient can come to the day hospital for treatment. Patient notified, verbalized understanding.

## 2018-05-29 NOTE — Progress Notes (Signed)
Patient admitted to day hospital for treatment of sickle cell pain crisis. Patient reported pain rated 9/10 in the left leg . Patient placed on Dilaudid PCA, given IV Toradol and hydrated with IV fluids. At discharge patient reported his pain at 5/10. Discharge instructions given to patient. Patient alert, oriented and ambulatory at discharge.

## 2018-05-29 NOTE — Discharge Instructions (Signed)
Sickle Cell Anemia, Adult °Sickle cell anemia is a condition where your red blood cells are shaped like sickles. Red blood cells carry oxygen through the body. Sickle-shaped red blood cells do not live as long as normal red blood cells. They also clump together and block blood from flowing through the blood vessels. These things prevent the body from getting enough oxygen. Sickle cell anemia causes organ damage and pain. It also increases the risk of infection. °Follow these instructions at home: °· Drink enough fluid to keep your pee (urine) clear or pale yellow. Drink more in hot weather and during exercise. °· Do not smoke. Smoking lowers oxygen levels in the blood. °· Only take over-the-counter or prescription medicines as told by your doctor. °· Take antibiotic medicines as told by your doctor. Make sure you finish them even if you start to feel better. °· Take supplements as told by your doctor. °· Consider wearing a medical alert bracelet. This tells anyone caring for you in an emergency of your condition. °· When traveling, keep your medical information, doctors' names, and the medicines you take with you at all times. °· If you have a fever, do not take fever medicines right away. This could cover up a problem. Tell your doctor. °· Keep all follow-up visits with your doctor. Sickle cell anemia requires regular medical care. °Contact a doctor if: °You have a fever. °Get help right away if: °· You feel dizzy or faint. °· You have new belly (abdominal) pain, especially on the left side near the stomach area. °· You have a lasting, often uncomfortable and painful erection of the penis (priapism). If it is not treated right away, you will become unable to have sex (impotence). °· You have numbness in your arms or legs or you have a hard time moving them. °· You have a hard time talking. °· You have a fever or lasting symptoms for more than 2-3 days. °· You have a fever and your symptoms suddenly get  worse. °· You have signs or symptoms of infection. These include: °? Chills. °? Being more tired than normal (lethargy). °? Irritability. °? Poor eating. °? Throwing up (vomiting). °· You have pain that is not helped with medicine. °· You have shortness of breath. °· You have pain in your chest. °· You are coughing up pus-like or bloody mucus. °· You have a stiff neck. °· Your feet or hands swell or have pain. °· Your belly looks bloated. °· Your joints hurt. °This information is not intended to replace advice given to you by your health care provider. Make sure you discuss any questions you have with your health care provider. °Document Released: 03/21/2013 Document Revised: 11/06/2015 Document Reviewed: 01/10/2013 °Elsevier Interactive Patient Education © 2017 Elsevier Inc. ° °

## 2018-05-29 NOTE — H&P (Signed)
[facility] History and Physical   Date: 05/29/2018  Patient name: William Jennings Medical record number: [id number] Date of birth: 08/15/1990 Age: 27 y.o. Gender: male PCP: Mike Gip, FNP  Attending physician: Quentin Angst, MD  Chief Complaint: Left leg pain  History of Present Illness: Dallin Mccorkel, a 27 year old male with a medical history significant for sickle cell anemia. Patient attributes current pain crisis to changes in weather.  Patient was last treated and evaluated in the emergency department on 05/28/2018.  He states that pain returned earlier this morning.  Patient also states that he is out home pain medications.  Current pain intensity is 7-8/10.  Left leg pain is characterized as constant and throbbing.  Patient states the pain is worsened by transitioning from sitting to standing.  Patient denies headache, shortness of breath, dysuria, nausea, vomiting, or diarrhea. Meds: Medications Prior to Admission  Medication Sig Dispense Refill Last Dose  . HYDROcodone-acetaminophen (NORCO/VICODIN) 5-325 MG tablet Take 1-2 tablets by mouth every 6 (six) hours as needed for up to 3 days. 10 tablet 0   . ibuprofen (ADVIL,MOTRIN) 800 MG tablet Take 1 tablet (800 mg total) by mouth every 8 (eight) hours as needed. 30 tablet 0 Taking    Allergies: Shellfish allergy Past Medical History:  Diagnosis Date  . Chronic prescription opiate use   . Sickle cell anemia (HCC)   . Sickle cell disease (HCC)    Past Surgical History:  Procedure Laterality Date  . CHOLECYSTECTOMY     No family history on file. Social History   Socioeconomic History  . Marital status: Single    Spouse name: Not on file  . Number of children: Not on file  . Years of education: Not on file  . Highest education level: Not on file  Occupational History  . Not on file  Social Needs  . Financial resource strain: Not on file  . Food insecurity:    Worry: Not on file   Inability: Not on file  . Transportation needs:    Medical: Not on file    Non-medical: Not on file  Tobacco Use  . Smoking status: Former Games developer  . Smokeless tobacco: Never Used  Substance and Sexual Activity  . Alcohol use: Yes    Comment: occ  . Drug use: No  . Sexual activity: Not on file  Lifestyle  . Physical activity:    Days per week: Not on file    Minutes per session: Not on file  . Stress: Not on file  Relationships  . Social connections:    Talks on phone: Not on file    Gets together: Not on file    Attends religious service: Not on file    Active member of club or organization: Not on file    Attends meetings of clubs or organizations: Not on file    Relationship status: Not on file  . Intimate partner violence:    Fear of current or ex partner: Not on file    Emotionally abused: Not on file    Physically abused: Not on file    Forced sexual activity: Not on file  Other Topics Concern  . Not on file  Social History Narrative  . Not on file   Review of Systems  Constitutional: Negative.  Negative for chills and fever.  HENT: Negative.   Respiratory: Negative.   Cardiovascular: Negative.  Negative for chest pain and palpitations.  Musculoskeletal: Positive for joint pain (  left leg). Negative for neck pain.  Skin: Negative.   Neurological: Negative.   Psychiatric/Behavioral: Negative.    Physical Exam: There were no vitals taken for this visit. Physical Exam Constitutional:      Appearance: Normal appearance.  HENT:     Head: Normocephalic.     Right Ear: Tympanic membrane normal.     Left Ear: Tympanic membrane normal.     Nose: Nose normal.     Mouth/Throat:     Mouth: Mucous membranes are dry.  Eyes:     Pupils: Pupils are equal, round, and reactive to light.  Cardiovascular:     Rate and Rhythm: Normal rate and regular rhythm.     Pulses: Normal pulses.     Heart sounds: Normal heart sounds.  Pulmonary:     Effort: Pulmonary effort is  normal.     Breath sounds: Normal breath sounds.  Musculoskeletal:     Left upper leg: He exhibits no swelling and no edema.     Left lower leg: He exhibits tenderness. He exhibits no swelling.  Neurological:     Mental Status: He is alert.      Lab results: Results for orders placed or performed during the hospital encounter of 05/28/18 (from the past 24 hour(s))  CBC WITH DIFFERENTIAL     Status: Abnormal   Collection Time: 05/28/18 12:41 PM  Result Value Ref Range   WBC 7.9 4.0 - 10.5 K/uL   RBC 4.54 4.22 - 5.81 MIL/uL   Hemoglobin 12.3 (L) 13.0 - 17.0 g/dL   HCT 24.4 (L) 01.0 - 27.2 %   MCV 76.4 (L) 80.0 - 100.0 fL   MCH 27.1 26.0 - 34.0 pg   MCHC 35.4 30.0 - 36.0 g/dL   RDW 53.6 64.4 - 03.4 %   Platelets 161 150 - 400 K/uL   nRBC 0.8 (H) 0.0 - 0.2 %   Neutrophils Relative % 57 %   Neutro Abs 4.5 1.7 - 7.7 K/uL   Lymphocytes Relative 31 %   Lymphs Abs 2.5 0.7 - 4.0 K/uL   Monocytes Relative 11 %   Monocytes Absolute 0.8 0.1 - 1.0 K/uL   Eosinophils Relative 0 %   Eosinophils Absolute 0.0 0.0 - 0.5 K/uL   Basophils Relative 0 %   Basophils Absolute 0.0 0.0 - 0.1 K/uL   Immature Granulocytes 1 %   Abs Immature Granulocytes 0.06 0.00 - 0.07 K/uL  Reticulocytes     Status: Abnormal   Collection Time: 05/28/18 12:41 PM  Result Value Ref Range   Retic Ct Pct 3.4 (H) 0.4 - 3.1 %   RBC. 4.55 4.22 - 5.81 MIL/uL   Retic Count, Absolute 153.8 19.0 - 186.0 K/uL   Immature Retic Fract 22.8 (H) 2.3 - 15.9 %  Basic metabolic panel     Status: None   Collection Time: 05/28/18 12:41 PM  Result Value Ref Range   Sodium 138 135 - 145 mmol/L   Potassium 3.6 3.5 - 5.1 mmol/L   Chloride 109 98 - 111 mmol/L   CO2 23 22 - 32 mmol/L   Glucose, Bld 92 70 - 99 mg/dL   BUN 7 6 - 20 mg/dL   Creatinine, Ser 7.42 0.61 - 1.24 mg/dL   Calcium 8.9 8.9 - 59.5 mg/dL   GFR calc non Af Amer >60 >60 mL/min   GFR calc Af Amer >60 >60 mL/min   Anion gap 6 5 - 15    Imaging results:  No  results found.   Assessment & Plan:  Patient will be admitted to the day infusion center for extended observation  Start IV D5.45 for cellular rehydration at 125/hr  Start Toradol 15 mg IV every 6 hours for inflammation.  Tylenol 650 mg   Start Dilaudid PCA High Concentration per weight based protocol.   Patient will be re-evaluated for pain intensity in the context of function and relationship to baseline as care progresses.  If no significant pain relief, will transfer patient to inpatient services for a higher level of care.   Will check CMP,  reticulocytes and CBC w/differential  Nolon NationsLachina Moore Pedram Goodchild  APRN, MSN, FNP-C [facility] [address]  [place], [place] [address] [phone]  05/29/2018, 9:56 AM

## 2018-05-30 ENCOUNTER — Telehealth (HOSPITAL_COMMUNITY): Payer: Self-pay | Admitting: *Deleted

## 2018-05-30 NOTE — Telephone Encounter (Signed)
Patient called requesting to come to the day infusion hospital for sickle cell pain. Patient reports pain in left leg rated 9/10. Reports last taking pain medication at 8:00 am. Denies fever, chest pain, nausea, vomiting, diarrhea, abdominal pain and priapism. Armeniahina, FNP notified. There are no more beds available in the day hospital. Provider advised patient to take prescribed pain medications every 4 hours and hydrate with 64 ounces of water. Patient is to call back in the morning if pain has not gotten any better.  Patient advised and expresses an understanding.

## 2018-05-31 ENCOUNTER — Encounter (HOSPITAL_COMMUNITY): Payer: Self-pay | Admitting: General Practice

## 2018-05-31 ENCOUNTER — Telehealth (HOSPITAL_COMMUNITY): Payer: Self-pay | Admitting: *Deleted

## 2018-05-31 ENCOUNTER — Non-Acute Institutional Stay (HOSPITAL_COMMUNITY)
Admission: AD | Admit: 2018-05-31 | Discharge: 2018-05-31 | Disposition: A | Discharge status: Home / Self Care | Payer: Medicaid Other | Source: Ambulatory Visit | Attending: Internal Medicine | Admitting: Internal Medicine

## 2018-05-31 DIAGNOSIS — D5700 Hb-SS disease with crisis, unspecified: Secondary | ICD-10-CM | POA: Insufficient documentation

## 2018-05-31 DIAGNOSIS — Z79891 Long term (current) use of opiate analgesic: Secondary | ICD-10-CM | POA: Insufficient documentation

## 2018-05-31 DIAGNOSIS — Z87891 Personal history of nicotine dependence: Secondary | ICD-10-CM | POA: Diagnosis not present

## 2018-05-31 DIAGNOSIS — M79605 Pain in left leg: Secondary | ICD-10-CM | POA: Diagnosis present

## 2018-05-31 DIAGNOSIS — Z791 Long term (current) use of non-steroidal anti-inflammatories (NSAID): Secondary | ICD-10-CM | POA: Insufficient documentation

## 2018-05-31 LAB — CBC WITH DIFFERENTIAL/PLATELET
Abs Immature Granulocytes: 0.03 10*3/uL (ref 0.00–0.07)
Basophils Absolute: 0 10*3/uL (ref 0.0–0.1)
Basophils Relative: 0 %
EOS ABS: 0.3 10*3/uL (ref 0.0–0.5)
Eosinophils Relative: 3 %
HCT: 36 % — ABNORMAL LOW (ref 39.0–52.0)
HEMOGLOBIN: 13 g/dL (ref 13.0–17.0)
Immature Granulocytes: 0 %
Lymphocytes Relative: 25 %
Lymphs Abs: 2.1 10*3/uL (ref 0.7–4.0)
MCH: 27.5 pg (ref 26.0–34.0)
MCHC: 36.1 g/dL — AB (ref 30.0–36.0)
MCV: 76.1 fL — ABNORMAL LOW (ref 80.0–100.0)
Monocytes Absolute: 1.3 10*3/uL — ABNORMAL HIGH (ref 0.1–1.0)
Monocytes Relative: 15 %
Neutro Abs: 4.8 10*3/uL (ref 1.7–7.7)
Neutrophils Relative %: 57 %
Platelets: 167 10*3/uL (ref 150–400)
RBC: 4.73 MIL/uL (ref 4.22–5.81)
RDW: 14.6 % (ref 11.5–15.5)
WBC: 8.5 10*3/uL (ref 4.0–10.5)
nRBC: 0 % (ref 0.0–0.2)

## 2018-05-31 LAB — COMPREHENSIVE METABOLIC PANEL
ALT: 45 U/L — ABNORMAL HIGH (ref 0–44)
AST: 41 U/L (ref 15–41)
Albumin: 4.5 g/dL (ref 3.5–5.0)
Alkaline Phosphatase: 130 U/L — ABNORMAL HIGH (ref 38–126)
Anion gap: 9 (ref 5–15)
BUN: 8 mg/dL (ref 6–20)
CO2: 24 mmol/L (ref 22–32)
Calcium: 9.1 mg/dL (ref 8.9–10.3)
Chloride: 103 mmol/L (ref 98–111)
Creatinine, Ser: 0.85 mg/dL (ref 0.61–1.24)
GFR calc Af Amer: >60 mL/min (ref >60)
Glucose, Bld: 96 mg/dL (ref 70–99)
Potassium: 3.6 mmol/L (ref 3.5–5.1)
Sodium: 136 mmol/L (ref 135–145)
Total Bilirubin: 2.1 mg/dL — ABNORMAL HIGH (ref 0.3–1.2)
Total Protein: 7.8 g/dL (ref 6.5–8.1)

## 2018-05-31 MED ORDER — SODIUM CHLORIDE 0.9% FLUSH: 9.0000 mL | INTRAVENOUS | Status: DC | PRN

## 2018-05-31 MED ORDER — DEXTROSE-NACL 5-0.45 % IV SOLN
INTRAVENOUS | Status: DC
  Administered 2018-05-31: 10:00:00 via INTRAVENOUS

## 2018-05-31 MED ORDER — OXYCODONE HCL 5 MG PO TABS
10.0000 mg | ORAL_TABLET | Freq: Once | ORAL | Status: AC
  Administered 2018-05-31: 10 mg via ORAL
  Filled 2018-05-31: qty 2

## 2018-05-31 MED ORDER — NALOXONE HCL 0.4 MG/ML IJ SOLN: 0.4000 mg | INTRAMUSCULAR | Status: DC | PRN

## 2018-05-31 MED ORDER — ONDANSETRON HCL 4 MG/2ML IJ SOLN: 4.0000 mg | Freq: Four times a day (QID) | INTRAMUSCULAR | Status: DC | PRN

## 2018-05-31 MED ORDER — KETOROLAC TROMETHAMINE 30 MG/ML IJ SOLN
15.0000 mg | Freq: Once | INTRAMUSCULAR | Status: AC
  Administered 2018-05-31: 15 mg via INTRAVENOUS
  Filled 2018-05-31: qty 1

## 2018-05-31 MED ORDER — HYDROMORPHONE 1 MG/ML IV SOLN
INTRAVENOUS | Status: DC
  Administered 2018-05-31: 30 mg via INTRAVENOUS
  Administered 2018-05-31: 7.5 mg via INTRAVENOUS
  Filled 2018-05-31: qty 30

## 2018-05-31 MED ORDER — DIPHENHYDRAMINE HCL 25 MG PO CAPS: 25.0000 mg | ORAL_CAPSULE | ORAL | Status: DC | PRN

## 2018-05-31 NOTE — Discharge Summary (Signed)
[facility] Discharge Summary   Patient ID: WM SAHAGUN MRN: [id number] DOB/AGE: 17-Dec-1990 27 y.o.  Admit date: 05/31/2018 Discharge date: 05/31/2018  Primary Care Physician:  Mike Gip, FNP  Admission Diagnoses:  Active Problems:   Sickle cell pain crisis [facility])   Discharge Medications:  Allergies as of 05/31/2018      Reactions   Shellfish Allergy Anaphylaxis      Medication List    TAKE these medications   HYDROcodone-acetaminophen 5-325 MG tablet Commonly known as:  NORCO/VICODIN Take 1 tablet by mouth every 6 (six) hours as needed.   ibuprofen 800 MG tablet Commonly known as:  ADVIL,MOTRIN Take 1 tablet (800 mg total) by mouth every 8 (eight) hours as needed.        Consults:  None  Significant Diagnostic Studies:  No results found.   [facility] Course: A 27 year old male with a medical history significant for sickle cell anemia, hemoglobin [place] was admitted to sickle cell day infusion center for pain management and extended observation. All laboratory values reviewed, consistent with baseline Patient hemodynamically stable throughout admission  Hypotonic IV fluids initiated for cellular rehydration at 75 mL/h Toradol 15 mg IV x1 for inflammation PCA Dilaudid per weight-based protocol for pain management.  Patient used a total of 7.5 mg with 15 demands and 14 delivered. Oxycodone 5 mg times one  Pain intensity decreased to 4/10. Patient alert, oriented, and ambulating. Patient will discharge home with family in stable condition.  Discharge instructions. Patient advised to follow-up in primary care in 1 week to repeat labs and for pain management. Continue to hydrate with 64 ounces of water per day Avoid all stressors that precipitate sickle cell crisis   The patient was given clear instructions to go to ER or return to medical center if symptoms do not improve, worsen or new problems develop. The patient  verbalized understanding.    Physical Exam at Discharge:  BP (!) 107/55 (BP Location: Left Arm)   Pulse 87   Temp 97.9 F (36.6 C) (Oral)   Resp 17   SpO2 96%  Physical Exam Constitutional:      Appearance: Normal appearance.  HENT:     Head: Normocephalic.     Nose: Nose normal.     Mouth/Throat:     Mouth: Mucous membranes are moist.  Neck:     Musculoskeletal: Normal range of motion.  Cardiovascular:     Rate and Rhythm: Normal rate.     Pulses: Normal pulses.     Heart sounds: Normal heart sounds.  Pulmonary:     Effort: Pulmonary effort is normal.  Abdominal:     General: Abdomen is flat.  Musculoskeletal: Normal range of motion.  Skin:    General: Skin is warm and dry.  Neurological:     General: No focal deficit present.     Mental Status: He is alert. Mental status is at baseline.  Psychiatric:        Mood and Affect: Mood normal.     Disposition at Discharge: Discharge disposition: 01-Home or Self Care       Discharge Orders: Discharge Instructions    Discharge patient   Complete by:  As directed    Discharge disposition:  01-Home or Self Care   Discharge patient date:  05/31/2018      Condition at Discharge:   Stable  Time spent on Discharge:  20 minutes  Signed:   [name]  APRN, MSN, FNP-C  [facility] [address]  [place], [place] [address] [phone]  05/31/2018, 3:05 PM

## 2018-05-31 NOTE — Telephone Encounter (Signed)
Patient called in. Complains of pain in L leg rates 8/10. Denied chest pain, abd pain, fever, N/V/D. Wants to come in for treatment. Last took Oxycodone 5mg  1 tab at 0600. Was last seen in the ED  2 days ago- 05/29/18. Sister will take him home after his treatment. Armeniahina Hollis NP notified re: this matter and advised for the patient to come in for treatment. This Clinical research associatewriter called him back to come in to day hospital, verbalized understanding.

## 2018-05-31 NOTE — Discharge Instructions (Addendum)
Sickle Cell Anemia, Adult °Sickle cell anemia is a condition where your red blood cells are shaped like sickles. Red blood cells carry oxygen through the body. Sickle-shaped cells do not live as long as normal red blood cells. They also clump together and block blood from flowing through the blood vessels. This prevents the body from getting enough oxygen. Sickle cell anemia causes organ damage and pain. It also increases the risk of infection. °Follow these instructions at home: °Medicines °· Take over-the-counter and prescription medicines only as told by your doctor. °· If you were prescribed an antibiotic medicine, take it as told by your doctor. Do not stop taking the antibiotic even if you start to feel better. °· If you develop a fever, do not take medicines to lower the fever right away. Tell your doctor about the fever. °Managing pain, stiffness, and swelling °· Try these methods to help with pain: °? Use a heating pad. °? Take a warm bath. °? Distract yourself, such as by watching TV. °Eating and drinking °· Drink enough fluid to keep your pee (urine) clear or pale yellow. Drink more in hot weather and during exercise. °· Limit or avoid alcohol. °· Eat a healthy diet. Eat plenty of fruits, vegetables, whole grains, and lean protein. °· Take vitamins and supplements as told by your doctor. °Traveling °· When traveling, keep these with you: °? Your medical information. °? The names of your doctors. °? Your medicines. °· If you need to take an airplane, talk to your doctor first. °Activity °· Rest often. °· Avoid exercises that make your heart beat much faster, such as jogging. °General instructions °· Do not use products that have nicotine or tobacco, such as cigarettes and e-cigarettes. If you need help quitting, ask your doctor. °· Consider wearing a medical alert bracelet. °· Avoid being in high places (high altitudes), such as mountains. °· Avoid very hot or cold temperatures. °· Avoid places where the  temperature changes a lot. °· Keep all follow-up visits as told by your doctor. This is important. °Contact a doctor if: °· A joint hurts. °· Your feet or hands hurt or swell. °· You feel tired (fatigued). °Get help right away if: °· You have symptoms of infection. These include: °? Fever. °? Chills. °? Being very tired. °? Irritability. °? Poor eating. °? Throwing up (vomiting). °· You feel dizzy or faint. °· You have new stomach pain, especially on the left side. °· You have a an erection (priapism) that lasts more than 4 hours. °· You have numbness in your arms or legs. °· You have a hard time moving your arms or legs. °· You have trouble talking. °· You have pain that does not go away when you take medicine. °· You are short of breath. °· You are breathing fast. °· You have a long-term cough. °· You have pain in your chest. °· You have a bad headache. °· You have a stiff neck. °· Your stomach looks bloated even though you did not eat much. °· Your skin is pale. °· You suddenly cannot see well. °Summary °· Sickle cell anemia is a condition where your red blood cells are shaped like sickles. °· Follow your doctor's advice on ways to manage pain, food to eat, activities to do, and steps to take for safe travel. °· Get medical help right away if you have any signs of infection, such as a fever. °This information is not intended to replace advice given to you by your   health care provider. Make sure you discuss any questions you have with your health care provider. Document Released: 03/21/2013 Document Revised: 07/06/2016 Document Reviewed: 07/06/2016 Elsevier Interactive Patient Education  2019 [employer].   Discharge instructions. Patient advised to follow-up in primary care in 1 week to repeat labs and for pain management. Continue to hydrate with 64 ounces of water per day Avoid all stressors that precipitate sickle cell crisis   The patient was given clear instructions to go to ER or return to  medical center if symptoms do not improve, worsen or new problems develop.     Sickle Cell Anemia, Adult Sickle cell anemia is a condition where your red blood cells are shaped like sickles. Red blood cells carry oxygen through the body. Sickle-shaped cells do not live as long as normal red blood cells. They also clump together and block blood from flowing through the blood vessels. This prevents the body from getting enough oxygen. Sickle cell anemia causes organ damage and pain. It also increases the risk of infection. Follow these instructions at home: Medicines  Take over-the-counter and prescription medicines only as told by your doctor.  If you were prescribed an antibiotic medicine, take it as told by your doctor. Do not stop taking the antibiotic even if you start to feel better.  If you develop a fever, do not take medicines to lower the fever right away. Tell your doctor about the fever. Managing pain, stiffness, and swelling  Try these methods to help with pain: ? Use a heating pad. ? Take a warm bath. ? Distract yourself, such as by watching TV. Eating and drinking  Drink enough fluid to keep your pee (urine) clear or pale yellow. Drink more in hot weather and during exercise.  Limit or avoid alcohol.  Eat a healthy diet. Eat plenty of fruits, vegetables, whole grains, and lean protein.  Take vitamins and supplements as told by your doctor. Traveling  When traveling, keep these with you: ? Your medical information. ? The names of your doctors. ? Your medicines.  If you need to take an airplane, talk to your doctor first. Activity  Rest often.  Avoid exercises that make your heart beat much faster, such as jogging. General instructions  Do not use products that have nicotine or tobacco, such as cigarettes and e-cigarettes. If you need help quitting, ask your doctor.  Consider wearing a medical alert bracelet.  Avoid being in high places (high altitudes), such  as mountains.  Avoid very hot or cold temperatures.  Avoid places where the temperature changes a lot.  Keep all follow-up visits as told by your doctor. This is important. Contact a doctor if:  A joint hurts.  Your feet or hands hurt or swell.  You feel tired (fatigued). Get help right away if:  You have symptoms of infection. These include: ? Fever. ? Chills. ? Being very tired. ? Irritability. ? Poor eating. ? Throwing up (vomiting).  You feel dizzy or faint.  You have new stomach pain, especially on the left side.  You have a an erection (priapism) that lasts more than 4 hours.  You have numbness in your arms or legs.  You have a hard time moving your arms or legs.  You have trouble talking.  You have pain that does not go away when you take medicine.  You are short of breath.  You are breathing fast.  You have a long-term cough.  You have pain in your chest.  You have a bad headache.  You have a stiff neck.  Your stomach looks bloated even though you did not eat much.  Your skin is pale.  You suddenly cannot see well. Summary  Sickle cell anemia is a condition where your red blood cells are shaped like sickles.  Follow your doctor's advice on ways to manage pain, food to eat, activities to do, and steps to take for safe travel.  Get medical help right away if you have any signs of infection, such as a fever. This information is not intended to replace advice given to you by your health care provider. Make sure you discuss any questions you have with your health care provider. Document Released: 03/21/2013 Document Revised: 07/06/2016 Document Reviewed: 07/06/2016 Elsevier Interactive Patient Education  2019 [employer]  Sickle Cell Anemia, Adult Sickle cell anemia is a condition where your red blood cells are shaped like sickles. Red blood cells carry oxygen through the body. Sickle-shaped cells do not live as long as normal red blood cells.  They also clump together and block blood from flowing through the blood vessels. This prevents the body from getting enough oxygen. Sickle cell anemia causes organ damage and pain. It also increases the risk of infection. Follow these instructions at home: Medicines  Take over-the-counter and prescription medicines only as told by your doctor.  If you were prescribed an antibiotic medicine, take it as told by your doctor. Do not stop taking the antibiotic even if you start to feel better.  If you develop a fever, do not take medicines to lower the fever right away. Tell your doctor about the fever. Managing pain, stiffness, and swelling  Try these methods to help with pain: ? Use a heating pad. ? Take a warm bath. ? Distract yourself, such as by watching TV. Eating and drinking  Drink enough fluid to keep your pee (urine) clear or pale yellow. Drink more in hot weather and during exercise.  Limit or avoid alcohol.  Eat a healthy diet. Eat plenty of fruits, vegetables, whole grains, and lean protein.  Take vitamins and supplements as told by your doctor. Traveling  When traveling, keep these with you: ? Your medical information. ? The names of your doctors. ? Your medicines.  If you need to take an airplane, talk to your doctor first. Activity  Rest often.  Avoid exercises that make your heart beat much faster, such as jogging. General instructions  Do not use products that have nicotine or tobacco, such as cigarettes and e-cigarettes. If you need help quitting, ask your doctor.  Consider wearing a medical alert bracelet.  Avoid being in high places (high altitudes), such as mountains.  Avoid very hot or cold temperatures.  Avoid places where the temperature changes a lot.  Keep all follow-up visits as told by your doctor. This is important. Contact a doctor if:  A joint hurts.  Your feet or hands hurt or swell.  You feel tired (fatigued). Get help right away  if:  You have symptoms of infection. These include: ? Fever. ? Chills. ? Being very tired. ? Irritability. ? Poor eating. ? Throwing up (vomiting).  You feel dizzy or faint.  You have new stomach pain, especially on the left side.  You have a an erection (priapism) that lasts more than 4 hours.  You have numbness in your arms or legs.  You have a hard time moving your arms or legs.  You have trouble talking.  You have  pain that does not go away when you take medicine.  You are short of breath.  You are breathing fast.  You have a long-term cough.  You have pain in your chest.  You have a bad headache.  You have a stiff neck.  Your stomach looks bloated even though you did not eat much.  Your skin is pale.  You suddenly cannot see well. Summary  Sickle cell anemia is a condition where your red blood cells are shaped like sickles.  Follow your doctor's advice on ways to manage pain, food to eat, activities to do, and steps to take for safe travel.  Get medical help right away if you have any signs of infection, such as a fever. This information is not intended to replace advice given to you by your health care provider. Make sure you discuss any questions you have with your health care provider. Document Released: 03/21/2013 Document Revised: 07/06/2016 Document Reviewed: 07/06/2016 Elsevier Interactive Patient Education  2019 [employer].

## 2018-05-31 NOTE — Progress Notes (Signed)
Patient admitted to the day hospital for treatment of sickle cell pain crisis. Patient reported pain rated 8/10 in the left leg . Patient placed on Dilaudid PCA, given IV Toradol, PO Oxycodone and hydrated with IV fluids. At discharge patient reported his pain at 4/10. Discharge instructions given to patient. Patient alert, oriented and ambulatory at discharge.

## 2018-05-31 NOTE — H&P (Signed)
[facility] History and Physical   Date: 05/31/2018  Patient name: William Jennings Medical record number: [id number] Date of birth: 1991/01/12 Age: 27 y.o. Gender: male PCP: Mike Gip, FNP  Attending physician: Quentin Angst, MD  Chief Complaint: Left leg pain  History of Present Illness: William Jennings, a 27 year old male with a medical history significant for sickle cell anemia. Patient attributes current pain crisis to changes in weather.  Patient was last treated and evaluated in the emergency department on 05/28/2018 and admitted to day infusion center on 05/29/2018.  He has been taking home opiate medications consistently without relief. He last had percocet 5-325 mg around 6 am. Current pain intensity is 7-8/10.  Left leg pain is characterized as constant and throbbing.   Patient denies headache, shortness of breath, dysuria, nausea, vomiting, or diarrhea. Meds: Medications Prior to Admission  Medication Sig Dispense Refill Last Dose  . HYDROcodone-acetaminophen (NORCO/VICODIN) 5-325 MG tablet Take 1 tablet by mouth every 6 (six) hours as needed. 15 tablet 0   . ibuprofen (ADVIL,MOTRIN) 800 MG tablet Take 1 tablet (800 mg total) by mouth every 8 (eight) hours as needed. 30 tablet 0     Allergies: Shellfish allergy Past Medical History:  Diagnosis Date  . Chronic prescription opiate use   . Sickle cell anemia (HCC)   . Sickle cell disease (HCC)    Past Surgical History:  Procedure Laterality Date  . CHOLECYSTECTOMY     No family history on file. Social History   Socioeconomic History  . Marital status: Single    Spouse name: Not on file  . Number of children: Not on file  . Years of education: Not on file  . Highest education level: Not on file  Occupational History  . Not on file  Social Needs  . Financial resource strain: Not on file  . Food insecurity:    Worry: Not on file    Inability: Not on file  . Transportation needs:     Medical: Not on file    Non-medical: Not on file  Tobacco Use  . Smoking status: Former Games developer  . Smokeless tobacco: Never Used  Substance and Sexual Activity  . Alcohol use: Yes    Comment: occ  . Drug use: No  . Sexual activity: Not on file  Lifestyle  . Physical activity:    Days per week: Not on file    Minutes per session: Not on file  . Stress: Not on file  Relationships  . Social connections:    Talks on phone: Not on file    Gets together: Not on file    Attends religious service: Not on file    Active member of club or organization: Not on file    Attends meetings of clubs or organizations: Not on file    Relationship status: Not on file  . Intimate partner violence:    Fear of current or ex partner: Not on file    Emotionally abused: Not on file    Physically abused: Not on file    Forced sexual activity: Not on file  Other Topics Concern  . Not on file  Social History Narrative  . Not on file   Review of Systems  Constitutional: Negative.  Negative for chills and fever.  HENT: Negative.   Respiratory: Negative.   Cardiovascular: Negative.  Negative for chest pain and palpitations.  Musculoskeletal: Positive for joint pain (left leg). Negative for neck pain.  Skin:  Negative.   Neurological: Negative.   Psychiatric/Behavioral: Negative.    Physical Exam: There were no vitals taken for this visit. Physical Exam Constitutional:      Appearance: Normal appearance.  HENT:     Head: Normocephalic.     Right Ear: Tympanic membrane normal.     Left Ear: Tympanic membrane normal.     Nose: Nose normal.     Mouth/Throat:     Mouth: Mucous membranes are dry.  Eyes:     Pupils: Pupils are equal, round, and reactive to light.  Cardiovascular:     Rate and Rhythm: Normal rate and regular rhythm.     Pulses: Normal pulses.     Heart sounds: Normal heart sounds.  Pulmonary:     Effort: Pulmonary effort is normal.     Breath sounds: Normal breath sounds.   Musculoskeletal:     Left upper leg: He exhibits no swelling and no edema.     Left lower leg: He exhibits tenderness. He exhibits no swelling.  Neurological:     Mental Status: He is alert.      Lab results: No results found for this or any previous visit (from the past 24 hour(s)).  Imaging results:  No results found.   Assessment & Plan:  Patient will be admitted to the day infusion center for extended observation  Start IV D5.45 for cellular rehydration at 75/hr  Start Toradol 15 mg IV times 1  Start Dilaudid PCA High Concentration per weight based protocol. Settings 0.5 mg bolus, 10 minutes lockout, 3 mg/h.  Patient will be re-evaluated for pain intensity in the context of function and relationship to baseline as care progresses.  If no significant pain relief, will transfer patient to inpatient services for a higher level of care.   Will check CMP,  reticulocytes and CBC w/differential  Nolon NationsLachina Moore Roselynne Lortz  APRN, MSN, FNP-C [facility] [address]  , [place] [id number] [phone]  05/31/2018, 9:35 AM

## 2018-06-04 ENCOUNTER — Encounter (HOSPITAL_BASED_OUTPATIENT_CLINIC_OR_DEPARTMENT_OTHER): Payer: Self-pay | Admitting: Emergency Medicine

## 2018-06-04 ENCOUNTER — Emergency Department (HOSPITAL_BASED_OUTPATIENT_CLINIC_OR_DEPARTMENT_OTHER)
Admission: EM | Admit: 2018-06-04 | Discharge: 2018-06-04 | Disposition: A | Discharge status: Home / Self Care | Payer: Medicaid Other | Attending: Emergency Medicine | Admitting: Emergency Medicine

## 2018-06-04 ENCOUNTER — Other Ambulatory Visit: Payer: Self-pay

## 2018-06-04 DIAGNOSIS — M25562 Pain in left knee: Secondary | ICD-10-CM | POA: Diagnosis present

## 2018-06-04 DIAGNOSIS — D5700 Hb-SS disease with crisis, unspecified: Secondary | ICD-10-CM | POA: Diagnosis not present

## 2018-06-04 DIAGNOSIS — Z87891 Personal history of nicotine dependence: Secondary | ICD-10-CM | POA: Insufficient documentation

## 2018-06-04 LAB — CBC WITH DIFFERENTIAL/PLATELET
BASOS PCT: 0 %
Basophils Absolute: 0 10*3/uL (ref 0.0–0.1)
EOS ABS: 0.2 10*3/uL (ref 0.0–0.5)
Eosinophils Relative: 2 %
HCT: 30.4 % — ABNORMAL LOW (ref 39.0–52.0)
Hemoglobin: 10.9 g/dL — ABNORMAL LOW (ref 13.0–17.0)
Lymphocytes Relative: 35 %
Lymphs Abs: 3 10*3/uL (ref 0.7–4.0)
MCH: 26.8 pg (ref 26.0–34.0)
MCHC: 35.9 g/dL (ref 30.0–36.0)
MCV: 74.7 fL — ABNORMAL LOW (ref 80.0–100.0)
Monocytes Absolute: 1.1 10*3/uL — ABNORMAL HIGH (ref 0.1–1.0)
Monocytes Relative: 13 %
Neutro Abs: 4.2 10*3/uL (ref 1.7–7.7)
Neutrophils Relative %: 49 %
PLATELETS: 154 10*3/uL (ref 150–400)
RBC: 4.07 MIL/uL — AB (ref 4.22–5.81)
RDW: 15 % (ref 11.5–15.5)
WBC: 8.6 10*3/uL (ref 4.0–10.5)
nRBC: 0 % (ref 0.0–0.2)

## 2018-06-04 LAB — RETICULOCYTES
Immature Retic Fract: 15 % (ref 2.3–15.9)
RBC.: 4.07 MIL/uL — ABNORMAL LOW (ref 4.22–5.81)
RETIC CT PCT: 1.6 % (ref 0.4–3.1)
Retic Count, Absolute: 63.1 10*3/uL (ref 19.0–186.0)

## 2018-06-04 LAB — COMPREHENSIVE METABOLIC PANEL
ALT: 54 U/L — ABNORMAL HIGH (ref 0–44)
AST: 52 U/L — ABNORMAL HIGH (ref 15–41)
Albumin: 3.9 g/dL (ref 3.5–5.0)
Alkaline Phosphatase: 123 U/L (ref 38–126)
Anion gap: 7 (ref 5–15)
BUN: 11 mg/dL (ref 6–20)
CALCIUM: 9 mg/dL (ref 8.9–10.3)
CO2: 24 mmol/L (ref 22–32)
Chloride: 106 mmol/L (ref 98–111)
Creatinine, Ser: 0.83 mg/dL (ref 0.61–1.24)
Glucose, Bld: 107 mg/dL — ABNORMAL HIGH (ref 70–99)
Potassium: 3.7 mmol/L (ref 3.5–5.1)
Sodium: 137 mmol/L (ref 135–145)
Total Bilirubin: 1.1 mg/dL (ref 0.3–1.2)
Total Protein: 7.7 g/dL (ref 6.5–8.1)

## 2018-06-04 MED ORDER — HYDROMORPHONE HCL 1 MG/ML IJ SOLN
1.0000 mg | INTRAMUSCULAR | Status: AC
  Administered 2018-06-04 (×3): 1 mg via INTRAVENOUS
  Filled 2018-06-04 (×3): qty 1

## 2018-06-04 NOTE — Discharge Instructions (Addendum)
You were evaluated in the Emergency Department and after careful evaluation, we did not find any emergent condition requiring admission or further testing in the hospital.  Your symptoms today seem to be due to a flare of your sickle cell disease.  It is very important that you follow-up with your hematologist tomorrow morning to discuss your labs and prescribe you more pain medication.  Please return to the Emergency Department if you experience any worsening of your condition.  We encourage you to follow up with a primary care provider.  Thank you for allowing us to be a part of your care.

## 2018-06-04 NOTE — ED Triage Notes (Addendum)
Reports sickle cell pai to left leg x 1 week. Taking hydrocodone at home without relief.  In NAD during triage.  Laughing and smiling while triage took place.

## 2018-06-04 NOTE — ED Notes (Addendum)
Pt states he has been having left leg pain x 1 week. Taking ibuprofen and hyrodcodone with minimal relief. Pt ambulates with no distress. Provided heat packs to apply to leg. Pt states he took 800 mg of ibuprofen at 1700 tonight.

## 2018-06-04 NOTE — ED Provider Notes (Signed)
[facility] Emergency Department Provider Note MRN:  [id number]  Arrival date & time: 06/04/18     Chief Complaint   Sickle Cell Pain Crisis   History of Present Illness   William Jennings is a 27 y.o. year-old male with a history of sickle cell disease presenting to the ED with chief complaint of sickle cell pain crisis.  The pain is located in the left leg, mostly the left knee.  The pain began 2 weeks ago.  Was seen in the ED 6 days ago for the pain, was evaluated and given IV pain medication, sent home with prescription for pain medicine.  Patient ran out of his pain medication 3 days ago, has been trying to use ibuprofen for the pain but it is not helping enough.  Pain is currently 7 out of 10, constant.  No exacerbating or relieving factors.  Patient denies chest pain or shortness of breath, no fever, no other symptoms.  Review of Systems  A complete 10 system review of systems was obtained and all systems are negative except as noted in the HPI and PMH.   Patient's Health History    Past Medical History:  Diagnosis Date  . Chronic prescription opiate use   . Sickle cell anemia (HCC)   . Sickle cell disease (HCC)     Past Surgical History:  Procedure Laterality Date  . CHOLECYSTECTOMY      History reviewed. No pertinent family history.  Social History   Socioeconomic History  . Marital status: Single    Spouse name: Not on file  . Number of children: Not on file  . Years of education: Not on file  . Highest education level: Not on file  Occupational History  . Not on file  Social Needs  . Financial resource strain: Not on file  . Food insecurity:    Worry: Not on file    Inability: Not on file  . Transportation needs:    Medical: Not on file    Non-medical: Not on file  Tobacco Use  . Smoking status: Former Games developermoker  . Smokeless tobacco: Never Used  Substance and Sexual Activity  . Alcohol use: Yes    Comment: occ  . Drug use: No    . Sexual activity: Not on file  Lifestyle  . Physical activity:    Days per week: Not on file    Minutes per session: Not on file  . Stress: Not on file  Relationships  . Social connections:    Talks on phone: Not on file    Gets together: Not on file    Attends religious service: Not on file    Active member of club or organization: Not on file    Attends meetings of clubs or organizations: Not on file    Relationship status: Not on file  . Intimate partner violence:    Fear of current or ex partner: Not on file    Emotionally abused: Not on file    Physically abused: Not on file    Forced sexual activity: Not on file  Other Topics Concern  . Not on file  Social History Narrative  . Not on file     Physical Exam  Vital Signs and Nursing Notes reviewed Vitals:   06/04/18 2230 06/04/18 2238  BP: 113/65 115/65  Pulse: 90 95  Resp:  16  Temp:    SpO2: 100% 100%    CONSTITUTIONAL: Well-appearing, NAD NEURO:  Alert and  oriented x 3, no focal deficits EYES:  eyes equal and reactive ENT/NECK:  no LAD, no JVD CARDIO: Regular rate, well-perfused, normal S1 and S2 PULM:  CTAB no wheezing or rhonchi GI/GU:  normal bowel sounds, non-distended, non-tender MSK/SPINE:  No gross deformities, no edema SKIN:  no rash, atraumatic PSYCH:  Appropriate speech and behavior  Diagnostic and Interventional Summary    Labs Reviewed  COMPREHENSIVE METABOLIC PANEL - Abnormal; Notable for the following components:      Result Value   Glucose, Bld 107 (*)    AST 52 (*)    ALT 54 (*)    All other components within normal limits  CBC WITH DIFFERENTIAL/PLATELET - Abnormal; Notable for the following components:   RBC 4.07 (*)    Hemoglobin 10.9 (*)    HCT 30.4 (*)    MCV 74.7 (*)    Monocytes Absolute 1.1 (*)    All other components within normal limits  RETICULOCYTES - Abnormal; Notable for the following components:   RBC. 4.07 (*)    All other components within normal limits    No  orders to display    Medications  HYDROmorphone (DILAUDID) injection 1 mg (1 mg Intravenous Given 06/04/18 2235)     Procedures Critical Care  ED Course and Medical Decision Making  I have reviewed the triage vital signs and the nursing notes.  Pertinent labs & imaging results that were available during my care of the patient were reviewed by me and considered in my medical decision making (see below for details).  Seems to be an uncomplicated sickle cell pain crisis, largely nontender leg, no swelling, no erythema, normal range of motion of the knee, nothing to suggest septic joint or DVT.  Will provide Dilaudid, reassess.  Patient is feeling much better, feels good enough for discharge.  Labs largely un-concerning, reticulocyte count lower than his norm.  These results discussed with patient, who has follow-up with his hematologist tomorrow morning.  Stressed the importance of keeping this follow-up, provided with work note.  After the discussed management above, the patient was determined to be safe for discharge.  The patient was in agreement with this plan and all questions regarding their care were answered.  ED return precautions were discussed and the patient will return to the ED with any significant worsening of condition.  Elmer Sow M. Pilar PlateBero, MD [facility] [email or web address]  Final Clinical Impressions(s) / ED Diagnoses     ICD-10-CM   1. Sickle cell pain crisis [facility](HCC) D57.00     ED Discharge Orders    None         Sabas SousBero,  M, MD 06/04/18 2302

## 2018-06-05 ENCOUNTER — Ambulatory Visit: Payer: Self-pay | Admitting: Family Medicine

## 2018-06-15 ENCOUNTER — Ambulatory Visit: Payer: Self-pay | Admitting: Family Medicine

## 2018-06-17 ENCOUNTER — Emergency Department (HOSPITAL_BASED_OUTPATIENT_CLINIC_OR_DEPARTMENT_OTHER): Payer: Medicaid Other

## 2018-06-17 ENCOUNTER — Encounter (HOSPITAL_BASED_OUTPATIENT_CLINIC_OR_DEPARTMENT_OTHER): Payer: Self-pay | Admitting: Emergency Medicine

## 2018-06-17 ENCOUNTER — Other Ambulatory Visit: Payer: Self-pay

## 2018-06-17 ENCOUNTER — Emergency Department (HOSPITAL_BASED_OUTPATIENT_CLINIC_OR_DEPARTMENT_OTHER)
Admission: EM | Admit: 2018-06-17 | Discharge: 2018-06-17 | Disposition: A | Discharge status: Home / Self Care | Payer: Medicaid Other | Attending: Emergency Medicine | Admitting: Emergency Medicine

## 2018-06-17 DIAGNOSIS — Z79899 Other long term (current) drug therapy: Secondary | ICD-10-CM | POA: Diagnosis not present

## 2018-06-17 DIAGNOSIS — Z87891 Personal history of nicotine dependence: Secondary | ICD-10-CM | POA: Insufficient documentation

## 2018-06-17 DIAGNOSIS — D5700 Hb-SS disease with crisis, unspecified: Secondary | ICD-10-CM | POA: Diagnosis not present

## 2018-06-17 DIAGNOSIS — R079 Chest pain, unspecified: Secondary | ICD-10-CM | POA: Diagnosis present

## 2018-06-17 LAB — BASIC METABOLIC PANEL
Anion gap: 7 (ref 5–15)
BUN: 8 mg/dL (ref 6–20)
CO2: 24 mmol/L (ref 22–32)
Calcium: 9 mg/dL (ref 8.9–10.3)
Chloride: 107 mmol/L (ref 98–111)
Creatinine, Ser: 0.85 mg/dL (ref 0.61–1.24)
GFR calc Af Amer: >60 mL/min (ref >60)
GFR calc non Af Amer: >60 mL/min (ref >60)
Glucose, Bld: 94 mg/dL (ref 70–99)
POTASSIUM: 3.4 mmol/L — AB (ref 3.5–5.1)
Sodium: 138 mmol/L (ref 135–145)

## 2018-06-17 LAB — CBC
HCT: 33.4 % — ABNORMAL LOW (ref 39.0–52.0)
Hemoglobin: 11.7 g/dL — ABNORMAL LOW (ref 13.0–17.0)
MCH: 26.6 pg (ref 26.0–34.0)
MCHC: 35 g/dL (ref 30.0–36.0)
MCV: 75.9 fL — ABNORMAL LOW (ref 80.0–100.0)
Platelets: 192 10*3/uL (ref 150–400)
RBC: 4.4 MIL/uL (ref 4.22–5.81)
RDW: 16.8 % — ABNORMAL HIGH (ref 11.5–15.5)
WBC: 7.5 10*3/uL (ref 4.0–10.5)
nRBC: 0.7 % — ABNORMAL HIGH (ref 0.0–0.2)

## 2018-06-17 LAB — TROPONIN I: Troponin I: <0.03 ng/mL (ref <0.03)

## 2018-06-17 MED ORDER — HYDROMORPHONE HCL 1 MG/ML IJ SOLN: 2.0000 mg | INTRAMUSCULAR | Status: AC

## 2018-06-17 MED ORDER — HYDROMORPHONE HCL 1 MG/ML IJ SOLN: 2.0000 mg | INTRAMUSCULAR | Status: DC

## 2018-06-17 MED ORDER — DIPHENHYDRAMINE HCL 50 MG/ML IJ SOLN
25.0000 mg | Freq: Once | INTRAMUSCULAR | Status: AC
  Administered 2018-06-17: 25 mg via INTRAVENOUS
  Filled 2018-06-17: qty 1

## 2018-06-17 MED ORDER — SODIUM CHLORIDE 0.45 % IV SOLN
INTRAVENOUS | Status: DC
  Administered 2018-06-17: 18:00:00 via INTRAVENOUS

## 2018-06-17 MED ORDER — HYDROMORPHONE HCL 1 MG/ML IJ SOLN
2.0000 mg | INTRAMUSCULAR | Status: AC
  Administered 2018-06-17: 2 mg via INTRAVENOUS
  Filled 2018-06-17: qty 2

## 2018-06-17 MED ORDER — ONDANSETRON HCL 4 MG/2ML IJ SOLN
4.0000 mg | INTRAMUSCULAR | Status: DC | PRN
  Administered 2018-06-17: 4 mg via INTRAVENOUS
  Filled 2018-06-17: qty 2

## 2018-06-17 MED ORDER — KETOROLAC TROMETHAMINE 15 MG/ML IJ SOLN
15.0000 mg | INTRAMUSCULAR | Status: AC
  Administered 2018-06-17: 15 mg via INTRAVENOUS
  Filled 2018-06-17: qty 1

## 2018-06-17 NOTE — Discharge Instructions (Addendum)
1.  Follow-up with your primary sickle cell provider as soon as possible.

## 2018-06-17 NOTE — ED Notes (Signed)
Pt given gingerale and crackers 

## 2018-06-17 NOTE — ED Provider Notes (Signed)
[facility] EMERGENCY DEPARTMENT Provider Note   CSN: [id number] Arrival date & time: 06/17/18  1551     History   Chief Complaint Chief Complaint  Patient presents with  . Chest Pain    HPI William Jennings is a 28 y.o. male.  HPI Patient reports he started getting chest pain yesterday.  He has had a tight sensation was a sharp quality in the left anterior chest.  He reports that he started to feel slightly short of breath as well.  He denies he had a fever or cough.  No lower extremity swelling or calf pain.  No nausea vomiting or abdominal pain.  Patient denies extremity pain. Past Medical History:  Diagnosis Date  . Chronic prescription opiate use   . Sickle cell anemia (HCC)   . Sickle cell disease [facility](HCC)     Patient Active Problem List   Diagnosis Date Noted  . Sickle cell pain crisis (HCC) 05/29/2018  . Sickle cell anemia with pain (HCC) 04/05/2016  . Acute chest syndrome due to sickle cell crisis (HCC)   . Sickle cell anemia with crisis (HCC) 02/21/2014  . Sickle cell anemia (HCC) 08/10/2013  . Chest pain 08/10/2013  . Dehydration 08/08/2013  . Sickle cell crisis (HCC) 08/06/2013  . 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 CHOLECYSTECTOMY          Home Medications    Prior to Admission medications   Medication Sig Start Date End Date Taking? Authorizing Provider  HYDROcodone-acetaminophen (NORCO/VICODIN) 5-325 MG tablet Take 1 tablet by mouth every 6 (six) hours as needed. 05/29/18   Massie MaroonHollis, Lachina M, FNP  ibuprofen (ADVIL,MOTRIN) 800 MG tablet Take 1 tablet (800 mg total) by mouth every 8 (eight) hours as needed. 05/29/18   Massie MaroonHollis, Lachina M, FNP    Family History History reviewed. No pertinent family history.  Social History Social History   Tobacco Use  . Smoking status: Former Games developermoker  . Smokeless tobacco: Never Used    Substance Use Topics  . Alcohol use: Yes    Comment: occ  . Drug use: No     Allergies   Shellfish allergy   Review of Systems Review of Systems  10 Systems reviewed and are negative for acute change except as noted in the HPI.  Physical Exam Updated Vital Signs BP (!) 109/49   Pulse (!) 112   Temp 98.1 F (36.7 C) (Oral)   Resp (!) 21   Ht 5\' 8"  (1.727 m)   Wt 68 kg   SpO2 100%   BMI 22.81 kg/m   Physical Exam Constitutional:      Appearance: He is well-developed.  HENT:     Head: Normocephalic and atraumatic.     Mouth/Throat:     Mouth: Mucous membranes are moist.     Pharynx: No oropharyngeal exudate or posterior oropharyngeal erythema.  Eyes:     Pupils: Pupils are equal, round, and reactive to light.  Neck:     Musculoskeletal: Neck supple.  Cardiovascular:     Rate and Rhythm: Normal rate and regular rhythm.     Heart sounds: Normal heart sounds.  Pulmonary:     Effort: Pulmonary effort is normal.     Breath sounds: Normal breath sounds.     Comments: Slight left anterior chest wall pain to palpation at about the seventh or eighth rib. Chest:  Chest wall: Tenderness present.  Abdominal:     General: Bowel sounds are normal. There is no distension.     Palpations: Abdomen is soft.     Tenderness: There is no abdominal tenderness.  Musculoskeletal: Normal range of motion.        General: No swelling or tenderness.  Skin:    General: Skin is warm and dry.  Neurological:     Mental Status: He is alert and oriented to person, place, and time.     GCS: GCS eye subscore is 4. GCS verbal subscore is 5. GCS motor subscore is 6.     Coordination: Coordination normal.  Psychiatric:        Mood and Affect: Mood normal.      ED Treatments / Results  Labs (all labs ordered are listed, but only abnormal results are displayed) Labs Reviewed  BASIC METABOLIC PANEL - Abnormal; Notable for the following components:      Result Value   Potassium 3.4  (*)    All other components within normal limits  CBC - Abnormal; Notable for the following components:   Hemoglobin 11.7 (*)    HCT 33.4 (*)    MCV 75.9 (*)    RDW 16.8 (*)    nRBC 0.7 (*)    All other components within normal limits  TROPONIN I    EKG None  Radiology Dg Chest 2 View  Result Date: 06/17/2018 CLINICAL DATA:  Moderate to severe chest pain and shortness of breath. History of sickle cell disease. EXAM: CHEST - 2 VIEW COMPARISON:  06/11/2017 FINDINGS: Normal heart, mediastinum and hila. Clear lungs.  No pleural effusion or pneumothorax. There skeletal changes. Wrist ache of sickle cell disease. No acute skeletal abnormality IMPRESSION: No active cardiopulmonary disease. Electronically Signed   By: Amie Portlandavid  Ormond M.D.   On: 06/17/2018 16:24    Procedures Procedures (including critical care time)  Medications Ordered in ED Medications  0.45 % sodium chloride infusion ( Intravenous New Bag/Given 06/17/18 1730)  HYDROmorphone (DILAUDID) injection 2 mg (has no administration in time range)    Or  HYDROmorphone (DILAUDID) injection 2 mg (has no administration in time range)  HYDROmorphone (DILAUDID) injection 2 mg (has no administration in time range)    Or  HYDROmorphone (DILAUDID) injection 2 mg (has no administration in time range)  HYDROmorphone (DILAUDID) injection 2 mg (has no administration in time range)    Or  HYDROmorphone (DILAUDID) injection 2 mg (has no administration in time range)  ondansetron (ZOFRAN) injection 4 mg (4 mg Intravenous Given 06/17/18 1734)  ketorolac (TORADOL) 15 MG/ML injection 15 mg (15 mg Intravenous Given 06/17/18 1731)  HYDROmorphone (DILAUDID) injection 2 mg (2 mg Intravenous Given 06/17/18 1732)    Or  HYDROmorphone (DILAUDID) injection 2 mg ( Subcutaneous See Alternative 06/17/18 1732)  diphenhydrAMINE (BENADRYL) injection 25 mg (25 mg Intravenous Given 06/17/18 1737)     Initial Impression / Assessment and Plan / ED Course  I have  reviewed the triage vital signs and the nursing notes.  Pertinent labs & imaging results that were available during my care of the patient were reviewed by me and considered in my medical decision making (see chart for details).     Patient is clinically well in appearance.  He is in no acute distress.  Patient is pain controlled with Dilaudid, fluids, Toradol.  Vital signs remained stable.  At this time patient is stable for discharge.  Return precautions reviewed.  Final Clinical Impressions(s) /  ED Diagnoses   Final diagnoses:  Sickle cell pain crisis [facility])    ED Discharge Orders    None       Arby Barrette, MD 06/17/18 1940

## 2018-06-17 NOTE — ED Triage Notes (Signed)
Patient stats that he has had chest pain x 24 hours with SOB and palpitations  - the patient states that he was sitting when it started. Patient states that he has hx of sickle cell disease

## 2018-06-17 NOTE — ED Notes (Signed)
Patient transported to X-ray 

## 2018-08-12 ENCOUNTER — Encounter (HOSPITAL_BASED_OUTPATIENT_CLINIC_OR_DEPARTMENT_OTHER): Payer: Self-pay

## 2018-08-12 ENCOUNTER — Emergency Department (HOSPITAL_BASED_OUTPATIENT_CLINIC_OR_DEPARTMENT_OTHER)
Admission: EM | Admit: 2018-08-12 | Discharge: 2018-08-12 | Disposition: A | Discharge status: Home / Self Care | Payer: Medicaid Other | Attending: Emergency Medicine | Admitting: Emergency Medicine

## 2018-08-12 ENCOUNTER — Other Ambulatory Visit: Payer: Self-pay

## 2018-08-12 DIAGNOSIS — D5700 Hb-SS disease with crisis, unspecified: Secondary | ICD-10-CM

## 2018-08-12 DIAGNOSIS — M549 Dorsalgia, unspecified: Secondary | ICD-10-CM | POA: Diagnosis present

## 2018-08-12 DIAGNOSIS — Z87891 Personal history of nicotine dependence: Secondary | ICD-10-CM | POA: Diagnosis not present

## 2018-08-12 LAB — CBC WITH DIFFERENTIAL/PLATELET
Abs Immature Granulocytes: 0.2 10*3/uL — ABNORMAL HIGH (ref 0.00–0.07)
Basophils Absolute: 0 10*3/uL (ref 0.0–0.1)
Basophils Relative: 0 %
Eosinophils Absolute: 0.1 10*3/uL (ref 0.0–0.5)
Eosinophils Relative: 1 %
HCT: 28.5 % — ABNORMAL LOW (ref 39.0–52.0)
HEMOGLOBIN: 10.3 g/dL — AB (ref 13.0–17.0)
Immature Granulocytes: 2 %
LYMPHS PCT: 49 %
Lymphs Abs: 6.1 10*3/uL — ABNORMAL HIGH (ref 0.7–4.0)
MCH: 27.7 pg (ref 26.0–34.0)
MCHC: 36.1 g/dL — ABNORMAL HIGH (ref 30.0–36.0)
MCV: 76.6 fL — ABNORMAL LOW (ref 80.0–100.0)
Monocytes Absolute: 1.3 10*3/uL — ABNORMAL HIGH (ref 0.1–1.0)
Monocytes Relative: 10 %
Neutro Abs: 4.8 10*3/uL (ref 1.7–7.7)
Neutrophils Relative %: 38 %
Platelets: 316 10*3/uL (ref 150–400)
RBC: 3.72 MIL/uL — ABNORMAL LOW (ref 4.22–5.81)
RDW: 16.3 % — ABNORMAL HIGH (ref 11.5–15.5)
Smear Review: NORMAL
WBC MORPHOLOGY: ABNORMAL
WBC: 12.6 10*3/uL — ABNORMAL HIGH (ref 4.0–10.5)
nRBC: 3.1 % — ABNORMAL HIGH (ref 0.0–0.2)

## 2018-08-12 LAB — COMPREHENSIVE METABOLIC PANEL
ALT: 16 U/L (ref 0–44)
AST: 27 U/L (ref 15–41)
Albumin: 4.2 g/dL (ref 3.5–5.0)
Alkaline Phosphatase: 81 U/L (ref 38–126)
Anion gap: 4 — ABNORMAL LOW (ref 5–15)
BUN: 8 mg/dL (ref 6–20)
CO2: 23 mmol/L (ref 22–32)
Calcium: 8.7 mg/dL — ABNORMAL LOW (ref 8.9–10.3)
Chloride: 111 mmol/L (ref 98–111)
Creatinine, Ser: 0.91 mg/dL (ref 0.61–1.24)
GFR calc non Af Amer: >60 mL/min (ref >60)
Glucose, Bld: 92 mg/dL (ref 70–99)
Potassium: 3.5 mmol/L (ref 3.5–5.1)
Sodium: 138 mmol/L (ref 135–145)
Total Bilirubin: 1.3 mg/dL — ABNORMAL HIGH (ref 0.3–1.2)
Total Protein: 7 g/dL (ref 6.5–8.1)

## 2018-08-12 LAB — RETICULOCYTES
Immature Retic Fract: 34.8 % — ABNORMAL HIGH (ref 2.3–15.9)
RBC.: 3.72 MIL/uL — ABNORMAL LOW (ref 4.22–5.81)
Retic Count, Absolute: 153.3 10*3/uL (ref 19.0–186.0)
Retic Ct Pct: 4.1 % — ABNORMAL HIGH (ref 0.4–3.1)

## 2018-08-12 MED ORDER — KETOROLAC TROMETHAMINE 30 MG/ML IJ SOLN
30.0000 mg | INTRAMUSCULAR | Status: AC
  Administered 2018-08-12: 30 mg via INTRAVENOUS
  Filled 2018-08-12: qty 1

## 2018-08-12 MED ORDER — DIPHENHYDRAMINE HCL 50 MG/ML IJ SOLN
25.0000 mg | Freq: Once | INTRAMUSCULAR | Status: AC
  Administered 2018-08-12: 25 mg via INTRAVENOUS
  Filled 2018-08-12: qty 1

## 2018-08-12 MED ORDER — HYDROMORPHONE HCL 1 MG/ML IJ SOLN
2.0000 mg | INTRAMUSCULAR | Status: AC
  Administered 2018-08-12: 2 mg via INTRAVENOUS

## 2018-08-12 MED ORDER — HYDROMORPHONE HCL 1 MG/ML IJ SOLN: 2.0000 mg | INTRAMUSCULAR | Status: AC

## 2018-08-12 MED ORDER — HYDROMORPHONE HCL 1 MG/ML IJ SOLN
2.0000 mg | INTRAMUSCULAR | Status: AC
  Administered 2018-08-12: 2 mg via INTRAVENOUS
  Filled 2018-08-12: qty 2

## 2018-08-12 MED ORDER — HYDROCODONE-ACETAMINOPHEN 5-325 MG PO TABS: 1.0000 | ORAL_TABLET | Freq: Four times a day (QID) | ORAL | 0 refills | Status: DC | PRN

## 2018-08-12 MED ORDER — SODIUM CHLORIDE 0.45 % IV SOLN
INTRAVENOUS | Status: DC
  Administered 2018-08-12: 21:00:00 via INTRAVENOUS

## 2018-08-12 MED ORDER — HYDROMORPHONE HCL 1 MG/ML IJ SOLN
2.0000 mg | INTRAMUSCULAR | Status: AC
  Administered 2018-08-12: 2 mg via INTRAVENOUS
  Filled 2018-08-12 (×2): qty 2

## 2018-08-12 MED ORDER — ONDANSETRON HCL 4 MG/2ML IJ SOLN
4.0000 mg | INTRAMUSCULAR | Status: DC | PRN
  Administered 2018-08-12: 4 mg via INTRAVENOUS
  Filled 2018-08-12: qty 2

## 2018-08-12 NOTE — ED Notes (Signed)
Pt request food and drink. RN asked provider and provider cleared patient to eat and drink

## 2018-08-12 NOTE — ED Notes (Signed)
Pt understood dc material. NAD noted. Script sent electronically. All questions answered to satisfaction. Pt ambulatory to ride.

## 2018-08-12 NOTE — ED Provider Notes (Signed)
[facility] EMERGENCY DEPARTMENT Provider Note   CSN: [id number] Arrival date & time: 08/12/18  2016    History   Chief Complaint Chief Complaint  Patient presents with  . Sickle Cell Pain Crisis    HPI William Jennings is a 28 y.o. male.     HPI Patient presented to the emergency room for evaluation of sickle cell pain crisis.  She has a history of sickle cell disease.  He has had 6 ED visits in the last 6 months for similar episodes.  Patient states he started having symptoms a few days ago.  He does not currently have any opiate prescriptions and was trying to manage with NSAIDs.  Pain is in his leg and his back.  This is very typical of his previous sickle cell symptoms.  Patient denies any fevers or chills.  No area. Past Medical History:  Diagnosis Date  . Chronic prescription opiate use   . Sickle cell anemia (HCC)   . Sickle cell disease [facility])     Patient Active Problem List   Diagnosis Date Noted  . Sickle cell pain crisis (HCC) 05/29/2018  . Sickle cell anemia with pain (HCC) 04/05/2016  . Acute chest syndrome due to sickle cell crisis (HCC)   . Sickle cell anemia with crisis (HCC) 02/21/2014  . Sickle cell anemia (HCC) 08/10/2013  . Chest pain 08/10/2013  . Dehydration 08/08/2013  . Sickle cell crisis (HCC) 08/06/2013  . 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 CHOLECYSTECTOMY          Home Medications    Prior to Admission medications   Medication Sig Start Date End Date Taking? Authorizing Provider  HYDROcodone-acetaminophen (NORCO/VICODIN) 5-325 MG tablet Take 1 tablet by mouth every 6 (six) hours as needed. 08/12/18   Linwood Dibbles, MD  ibuprofen (ADVIL,MOTRIN) 800 MG tablet Take 1 tablet (800 mg total) by mouth every 8 (eight) hours as needed. 05/29/18   Massie Maroon, FNP    Family History No family history on file.  Social  History Social History   Tobacco Use  . Smoking status: Former Games developer  . Smokeless tobacco: Never Used  Substance Use Topics  . Alcohol use: Yes    Comment: occ  . Drug use: No     Allergies   Shellfish allergy   Review of Systems Review of Systems  All other systems reviewed and are negative.    Physical Exam Updated Vital Signs BP 120/77   Pulse 89   Temp 98.3 F (36.8 C) (Oral)   Resp 20   Ht 1.727 m ( )   Wt 68 kg   SpO2 94%   BMI 22.81 kg/m   Physical Exam Vitals signs and nursing note reviewed.  Constitutional:      Appearance: Normal appearance. He is well-developed. He is not ill-appearing.  HENT:     Head: Normocephalic and atraumatic.     Right Ear: External ear normal.     Left Ear: External ear normal.  Eyes:     General: No scleral icterus.       Right eye: No discharge.        Left eye: No discharge.     Conjunctiva/sclera: Conjunctivae normal.  Neck:     Musculoskeletal: Neck supple.     Trachea: No tracheal deviation.  Cardiovascular:     Rate and Rhythm: Normal  rate and regular rhythm.  Pulmonary:     Effort: Pulmonary effort is normal. No respiratory distress.     Breath sounds: Normal breath sounds. No stridor. No wheezing or rales.  Abdominal:     General: Bowel sounds are normal. There is no distension.     Palpations: Abdomen is soft.     Tenderness: There is no abdominal tenderness. There is no guarding or rebound.  Musculoskeletal:        General: No tenderness.  Skin:    General: Skin is warm and dry.     Findings: No rash.  Neurological:     Mental Status: He is alert.     Cranial Nerves: No cranial nerve deficit (no facial droop, extraocular movements intact, no slurred speech).     Sensory: No sensory deficit.     Motor: No abnormal muscle tone or seizure activity.     Coordination: Coordination normal.      ED Treatments / Results  Labs (all labs ordered are listed, but only abnormal results are  displayed) Labs Reviewed  COMPREHENSIVE METABOLIC PANEL - Abnormal; Notable for the following components:      Result Value   Calcium 8.7 (*)    Total Bilirubin 1.3 (*)    Anion gap 4 (*)    All other components within normal limits  CBC WITH DIFFERENTIAL/PLATELET - Abnormal; Notable for the following components:   WBC 12.6 (*)    RBC 3.72 (*)    Hemoglobin 10.3 (*)    HCT 28.5 (*)    MCV 76.6 (*)    MCHC 36.1 (*)    RDW 16.3 (*)    nRBC 3.1 (*)    Lymphs Abs 6.1 (*)    Monocytes Absolute 1.3 (*)    Abs Immature Granulocytes 0.20 (*)    All other components within normal limits  RETICULOCYTES - Abnormal; Notable for the following components:   Retic Ct Pct 4.1 (*)    RBC. 3.72 (*)    Immature Retic Fract 34.8 (*)    All other components within normal limits     Procedures Procedures (including critical care time)  Medications Ordered in ED Medications  0.45 % sodium chloride infusion ( Intravenous Stopped 08/12/18 2305)  ondansetron (ZOFRAN) injection 4 mg (4 mg Intravenous Given 08/12/18 2046)  ketorolac (TORADOL) 30 MG/ML injection 30 mg (30 mg Intravenous Given 08/12/18 2046)  HYDROmorphone (DILAUDID) injection 2 mg (2 mg Intravenous Given 08/12/18 2133)    Or  HYDROmorphone (DILAUDID) injection 2 mg ( Subcutaneous See Alternative 08/12/18 2133)  HYDROmorphone (DILAUDID) injection 2 mg (2 mg Intravenous Given 08/12/18 2217)    Or  HYDROmorphone (DILAUDID) injection 2 mg ( Subcutaneous See Alternative 08/12/18 2217)  HYDROmorphone (DILAUDID) injection 2 mg (2 mg Intravenous Given 08/12/18 2047)    Or  HYDROmorphone (DILAUDID) injection 2 mg ( Subcutaneous See Alternative 08/12/18 2047)  diphenhydrAMINE (BENADRYL) injection 25 mg (25 mg Intravenous Given 08/12/18 2046)     Initial Impression / Assessment and Plan / ED Course  I have reviewed the triage vital signs and the nursing notes.  Pertinent labs & imaging results that were available during my care of the patient  were reviewed by me and considered in my medical decision making (see chart for details).  Clinical Course as of Feb 29 2334  Sat Aug 12, 2018  2157 Anemia noted.   [JK]  2158 Reticulocyte count is elevated   [JK]    Clinical Course User Index [JK]  Linwood Dibbles, MD     Presented to the emergency room with complaints of sickle cell pain crisis.  Patient was treated along the sickle cell pain algorithm.  His symptoms improved.  Patient felt ready for discharge.  I did review the [place] controlled substance database.  Patient has not had an opiate prescription in months.  I will give him a short course of hydrocodone.  I encouraged follow-up with his sickle cell doctor. Final Clinical Impressions(s) / ED Diagnoses   Final diagnoses:  Sickle cell pain crisis [facility])    ED Discharge Orders         Ordered    HYDROcodone-acetaminophen (NORCO/VICODIN) 5-325 MG tablet  Every 6 hours PRN     08/12/18 2326           Linwood Dibbles, MD 08/12/18 2334

## 2018-08-12 NOTE — Discharge Instructions (Addendum)
Follow up with your sickle cell doctor, take the pain medications as needed

## 2018-08-12 NOTE — ED Triage Notes (Signed)
Pt c/o pain in L leg and lower back x 2 days that pt states is due to sickle cell crisis. Pt has been taking ibuprofen without relief.

## 2018-08-13 ENCOUNTER — Encounter (HOSPITAL_BASED_OUTPATIENT_CLINIC_OR_DEPARTMENT_OTHER): Payer: Self-pay | Admitting: Emergency Medicine

## 2018-08-13 ENCOUNTER — Other Ambulatory Visit: Payer: Self-pay

## 2018-08-13 ENCOUNTER — Encounter (HOSPITAL_COMMUNITY): Payer: Self-pay

## 2018-08-13 ENCOUNTER — Emergency Department (HOSPITAL_BASED_OUTPATIENT_CLINIC_OR_DEPARTMENT_OTHER)
Admission: EM | Admit: 2018-08-13 | Discharge: 2018-08-13 | Disposition: A | Discharge status: Home / Self Care | Payer: Medicaid Other | Source: Home / Self Care | Attending: Emergency Medicine | Admitting: Emergency Medicine

## 2018-08-13 ENCOUNTER — Inpatient Hospital Stay (HOSPITAL_COMMUNITY)
Admission: EM | Admit: 2018-08-13 | Discharge: 2018-08-19 | DRG: 812 | Disposition: A | Discharge status: Home / Self Care | Payer: Medicaid Other | Attending: Internal Medicine | Admitting: Internal Medicine

## 2018-08-13 DIAGNOSIS — R509 Fever, unspecified: Secondary | ICD-10-CM

## 2018-08-13 DIAGNOSIS — D5700 Hb-SS disease with crisis, unspecified: Principal | ICD-10-CM | POA: Diagnosis present

## 2018-08-13 DIAGNOSIS — Z87891 Personal history of nicotine dependence: Secondary | ICD-10-CM

## 2018-08-13 DIAGNOSIS — Z79899 Other long term (current) drug therapy: Secondary | ICD-10-CM

## 2018-08-13 DIAGNOSIS — D72829 Elevated white blood cell count, unspecified: Secondary | ICD-10-CM

## 2018-08-13 DIAGNOSIS — R059 Cough, unspecified: Secondary | ICD-10-CM

## 2018-08-13 DIAGNOSIS — G894 Chronic pain syndrome: Secondary | ICD-10-CM

## 2018-08-13 DIAGNOSIS — R05 Cough: Secondary | ICD-10-CM

## 2018-08-13 DIAGNOSIS — D638 Anemia in other chronic diseases classified elsewhere: Secondary | ICD-10-CM

## 2018-08-13 LAB — COMPREHENSIVE METABOLIC PANEL
ALT: 29 U/L (ref 0–44)
AST: 104 U/L — AB (ref 15–41)
Albumin: 4.3 g/dL (ref 3.5–5.0)
Alkaline Phosphatase: 235 U/L — ABNORMAL HIGH (ref 38–126)
Anion gap: 9 (ref 5–15)
BUN: 7 mg/dL (ref 6–20)
CHLORIDE: 105 mmol/L (ref 98–111)
CO2: 21 mmol/L — AB (ref 22–32)
Calcium: 9.1 mg/dL (ref 8.9–10.3)
Creatinine, Ser: 0.84 mg/dL (ref 0.61–1.24)
GFR calc Af Amer: >60 mL/min (ref >60)
GFR calc non Af Amer: >60 mL/min (ref >60)
Glucose, Bld: 119 mg/dL — ABNORMAL HIGH (ref 70–99)
Potassium: 4.5 mmol/L (ref 3.5–5.1)
Sodium: 135 mmol/L (ref 135–145)
Total Bilirubin: 1.9 mg/dL — ABNORMAL HIGH (ref 0.3–1.2)
Total Protein: 7.6 g/dL (ref 6.5–8.1)

## 2018-08-13 LAB — CBC WITH DIFFERENTIAL/PLATELET
BASOS PCT: 0 %
Band Neutrophils: 3 %
Basophils Absolute: 0 10*3/uL (ref 0.0–0.1)
Blasts: 0 %
EOS PCT: 0 %
Eosinophils Absolute: 0 10*3/uL (ref 0.0–0.5)
HCT: 31 % — ABNORMAL LOW (ref 39.0–52.0)
Hemoglobin: 10.4 g/dL — ABNORMAL LOW (ref 13.0–17.0)
Lymphocytes Relative: 7 %
Lymphs Abs: 1.7 10*3/uL (ref 0.7–4.0)
MCH: 26.9 pg (ref 26.0–34.0)
MCHC: 33.5 g/dL (ref 30.0–36.0)
MCV: 80.3 fL (ref 80.0–100.0)
Metamyelocytes Relative: 0 %
Monocytes Absolute: 2 10*3/uL — ABNORMAL HIGH (ref 0.1–1.0)
Monocytes Relative: 8 %
Myelocytes: 0 %
NEUTROS ABS: 20.9 10*3/uL — AB (ref 1.7–7.7)
Neutrophils Relative %: 82 %
Other: 0 %
Platelets: 322 10*3/uL (ref 150–400)
Promyelocytes Relative: 0 %
RBC: 3.86 MIL/uL — ABNORMAL LOW (ref 4.22–5.81)
RDW: 16.4 % — ABNORMAL HIGH (ref 11.5–15.5)
WBC: 24.6 10*3/uL — ABNORMAL HIGH (ref 4.0–10.5)
nRBC: 5.9 % — ABNORMAL HIGH (ref 0.0–0.2)
nRBC: 6 /100 WBC — ABNORMAL HIGH

## 2018-08-13 LAB — RETICULOCYTES
IMMATURE RETIC FRACT: 34.6 % — AB (ref 2.3–15.9)
RBC.: 3.86 MIL/uL — ABNORMAL LOW (ref 4.22–5.81)
Retic Count, Absolute: 202.2 10*3/uL — ABNORMAL HIGH (ref 19.0–186.0)
Retic Ct Pct: 5.2 % — ABNORMAL HIGH (ref 0.4–3.1)

## 2018-08-13 MED ORDER — SODIUM CHLORIDE 0.45 % IV SOLN
INTRAVENOUS | Status: DC
  Administered 2018-08-13: 23:00:00 via INTRAVENOUS

## 2018-08-13 MED ORDER — HYDROMORPHONE HCL 1 MG/ML IJ SOLN
2.0000 mg | INTRAMUSCULAR | Status: AC | PRN
  Administered 2018-08-13 (×3): 2 mg via INTRAVENOUS
  Filled 2018-08-13 (×3): qty 2

## 2018-08-13 MED ORDER — KETOROLAC TROMETHAMINE 15 MG/ML IJ SOLN
15.0000 mg | INTRAMUSCULAR | Status: AC
  Administered 2018-08-13: 15 mg via INTRAVENOUS
  Filled 2018-08-13: qty 1

## 2018-08-13 MED ORDER — HYDROMORPHONE HCL 2 MG/ML IJ SOLN
2.0000 mg | INTRAMUSCULAR | Status: AC
  Administered 2018-08-13: 2 mg via INTRAVENOUS
  Filled 2018-08-13: qty 1

## 2018-08-13 MED ORDER — HYDROMORPHONE HCL 2 MG/ML IJ SOLN: 2.0000 mg | INTRAMUSCULAR | Status: AC

## 2018-08-13 MED ORDER — HYDROMORPHONE HCL 2 MG/ML IJ SOLN: 2.0000 mg | INTRAMUSCULAR | Status: DC

## 2018-08-13 MED ORDER — KCL IN DEXTROSE-NACL 20-5-0.45 MEQ/L-%-% IV SOLN
INTRAVENOUS | Status: DC
  Administered 2018-08-13: 10:00:00 via INTRAVENOUS
  Filled 2018-08-13: qty 1000

## 2018-08-13 MED ORDER — KETOROLAC TROMETHAMINE 15 MG/ML IJ SOLN
15.0000 mg | Freq: Once | INTRAMUSCULAR | Status: AC
  Administered 2018-08-13: 15 mg via INTRAVENOUS
  Filled 2018-08-13: qty 1

## 2018-08-13 MED ORDER — SODIUM CHLORIDE 0.9% FLUSH
3.0000 mL | Freq: Once | INTRAVENOUS | Status: AC
  Administered 2018-08-13: 3 mL via INTRAVENOUS

## 2018-08-13 NOTE — ED Triage Notes (Signed)
Reports back, chest and left leg pain x 3 days.  Seen here yesterday for same.

## 2018-08-13 NOTE — ED Notes (Signed)
Patient is resting comfortably. 

## 2018-08-13 NOTE — Discharge Instructions (Addendum)
You should have pain medicine for the times you have a crisis. This is something that should be prescribed by hematology, pain management or your PCP. If you have not seen someone recently, you need to follow-up with them as soon as you can.

## 2018-08-13 NOTE — ED Notes (Signed)
Patient's significant other came out while RN was assisting to discharge another patient. Pt's significant other was angry and stated it had been over an hour and he had not yet been seen by a doctor and that he had come by ambulance and "that should take priority over some of the other people in here." MD and PA and Charge made aware

## 2018-08-13 NOTE — ED Notes (Signed)
Bed: EX51 Expected date:  Expected time:  Means of arrival:  Comments: 28 yo M/ SCC

## 2018-08-13 NOTE — ED Notes (Signed)
Patient is resting comfortably, dozes at intervals

## 2018-08-13 NOTE — ED Provider Notes (Signed)
[facility]-EMERGENCY DEPT Provider Note   CSN: [id number] Arrival date & time: 08/13/18  2125    History   Chief Complaint Chief Complaint  Patient presents with  . Sickle Cell Pain Crisis    HPI William Jennings is a 28 y.o. male.     The history is provided by the patient and medical records.  Sickle Cell Pain Crisis     28 y.o. M with history of sickle cell anemia, presenting to the ED for pain crisis.  This is his 3rd in 2 days for same-- discharged from [facility] around noon today.  Family reports he has been in crisis for several days now.  States he has been given meds at facilities but does not seem to improve.  Pain is generalized.  He does have a cough, recently getting over the flu.  No fevers.  No chest pain or SOB.  Taken home meds without relief.  Past Medical History:  Diagnosis Date  . Chronic prescription opiate use   . Sickle cell anemia (HCC)   . Sickle cell disease [facility])     Patient Active Problem List   Diagnosis Date Noted  . Sickle cell pain crisis (HCC) 05/29/2018  . Sickle cell anemia with pain (HCC) 04/05/2016  . Acute chest syndrome due to sickle cell crisis (HCC)   . Sickle cell anemia with crisis (HCC) 02/21/2014  . Sickle cell anemia (HCC) 08/10/2013  . Chest pain 08/10/2013  . Dehydration 08/08/2013  . Sickle cell crisis (HCC) 08/06/2013  . 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 CHOLECYSTECTOMY          Home Medications    Prior to Admission medications   Medication Sig Start Date End Date Taking? Authorizing Provider  HYDROcodone-acetaminophen (NORCO/VICODIN) 5-325 MG tablet Take 1 tablet by mouth every 6 (six) hours as needed. Patient taking differently: Take 1 tablet by mouth every 6 (six) hours as needed for moderate pain or severe pain.  08/12/18  Yes Linwood Dibbles, MD  ibuprofen (ADVIL,MOTRIN) 800 MG  tablet Take 1 tablet (800 mg total) by mouth every 8 (eight) hours as needed. Patient taking differently: Take 800 mg by mouth every 8 (eight) hours as needed for headache, mild pain, moderate pain or cramping.  05/29/18  Yes Massie Maroon, FNP    Family History History reviewed. No pertinent family history.  Social History Social History   Tobacco Use  . Smoking status: Former Games developer  . Smokeless tobacco: Never Used  Substance Use Topics  . Alcohol use: Yes    Comment: occ  . Drug use: No     Allergies   Shellfish allergy   Review of Systems Review of Systems  Musculoskeletal: Positive for arthralgias and myalgias.  All other systems reviewed and are negative.    Physical Exam Updated Vital Signs BP 126/75   Pulse 93   Temp 98 F (36.7 C)   Resp 17   Ht 5\' 8"  (1.727 m)   Wt 68 kg   SpO2 100%   BMI 22.81 kg/m   Physical Exam Vitals signs and nursing note reviewed.  Constitutional:      Appearance: He is well-developed.     Comments: Lying in bed on all 4's, refuses to answer most questions  HENT:     Head: Normocephalic and atraumatic.  Eyes:     Conjunctiva/sclera: Conjunctivae  normal.     Pupils: Pupils are equal, round, and reactive to light.  Neck:     Musculoskeletal: Normal range of motion.  Cardiovascular:     Rate and Rhythm: Normal rate and regular rhythm.     Heart sounds: Normal heart sounds.  Pulmonary:     Effort: Pulmonary effort is normal.     Breath sounds: Normal breath sounds.  Abdominal:     General: Bowel sounds are normal.     Palpations: Abdomen is soft.  Musculoskeletal: Normal range of motion.  Skin:    General: Skin is warm and dry.  Neurological:     Mental Status: He is alert and oriented to person, place, and time.      ED Treatments / Results  Labs (all labs ordered are listed, but only abnormal results are displayed) Labs Reviewed  COMPREHENSIVE METABOLIC PANEL - Abnormal; Notable for the following  components:      Result Value   CO2 21 (*)    Glucose, Bld 119 (*)    AST 104 (*)    Alkaline Phosphatase 235 (*)    Total Bilirubin 1.9 (*)    All other components within normal limits  CBC WITH DIFFERENTIAL/PLATELET - Abnormal; Notable for the following components:   WBC 24.6 (*)    RBC 3.86 (*)    Hemoglobin 10.4 (*)    HCT 31.0 (*)    RDW 16.4 (*)    nRBC 5.9 (*)    nRBC 6 (*)    Neutro Abs 20.9 (*)    Monocytes Absolute 2.0 (*)    All other components within normal limits  RETICULOCYTES - Abnormal; Notable for the following components:   Retic Ct Pct 5.2 (*)    RBC. 3.86 (*)    Retic Count, Absolute 202.2 (*)    Immature Retic Fract 34.6 (*)    All other components within normal limits    EKG None  Radiology No results found.  Procedures Procedures (including critical care time)  Medications Ordered in ED Medications  0.45 % sodium chloride infusion ( Intravenous New Bag/Given 08/13/18 2256)  HYDROmorphone (DILAUDID) injection 2 mg ( Intravenous See Alternative 08/13/18 2256)    Or  HYDROmorphone (DILAUDID) injection 2 mg (2 mg Subcutaneous Not Given 08/13/18 2256)  sodium chloride flush (NS) 0.9 % injection 3 mL (3 mLs Intravenous Given 08/13/18 2150)  ketorolac (TORADOL) 15 MG/ML injection 15 mg (15 mg Intravenous Given 08/13/18 2252)  HYDROmorphone (DILAUDID) injection 2 mg (2 mg Intravenous Given 08/13/18 2252)    Or  HYDROmorphone (DILAUDID) injection 2 mg ( Subcutaneous See Alternative 08/13/18 2252)  HYDROmorphone (DILAUDID) injection 2 mg (2 mg Intravenous Given 08/13/18 2337)    Or  HYDROmorphone (DILAUDID) injection 2 mg ( Subcutaneous See Alternative 08/13/18 2337)  diphenhydrAMINE (BENADRYL) injection 25 mg (25 mg Intravenous Given 08/14/18 0129)     Initial Impression / Assessment and Plan / ED Course  I have reviewed the triage vital signs and the nursing notes.  Pertinent labs & imaging results that were available during my care of the patient were reviewed by  me and considered in my medical decision making (see chart for details).  28 year old male who with sickle cell pain crisis.  This is his third visit in the past 2 days for similar.  He reports generalized pain, mostly in the legs which is typical for him.  Has a slight cough, recent influenza.  He denies any chest pain, fever, shortness of breath.  Labs overall reassuring, does have leukocytosis without noted left shift.  Suspect this is likely reactive from his crisis.  Treatment via sickle cell protocol, pain only reduced to 6/10.  Patient reports ongoing pain, does not feel he can comfortably manage at home.  Given multiple visits in the past 2 days, feel it is reasonable to admit for pain control.  Discussed with Dr. Toniann FailKakrakandy-- he will admit for ongoing care.  Final Clinical Impressions(s) / ED Diagnoses   Final diagnoses:  Sickle cell pain crisis [facility](HCC)    ED Discharge Orders    None       Garlon HatchetSanders, Aloise Copus M, PA-C 08/14/18 0224    Lorre NickAllen, Anthony, MD 08/14/18 2257

## 2018-08-13 NOTE — ED Notes (Addendum)
Pt is requesting a fluid bolus

## 2018-08-13 NOTE — ED Notes (Signed)
ED Provider at bedside. 

## 2018-08-13 NOTE — ED Provider Notes (Signed)
[facility] EMERGENCY DEPARTMENT Provider Note   CSN: [id number] Arrival date & time: 08/13/18  7829    History   Chief Complaint Chief Complaint  Patient presents with  . Sickle Cell Pain Crisis    HPI William Jennings is a 28 y.o. male.     HPI   28yM with generalized pain. Hx of sickle cell anemia and feels like he is having a crisis. Hurts most in legs and lower back. No respiratory complaints. No fever or chills. No v/d. No joint swelling. Reports compliance with home meds but pain not being well controlled.   Past Medical History:  Diagnosis Date  . Chronic prescription opiate use   . Sickle cell anemia (HCC)   . Sickle cell disease [facility])     Patient Active Problem List   Diagnosis Date Noted  . Sickle cell pain crisis (HCC) 05/29/2018  . Sickle cell anemia with pain (HCC) 04/05/2016  . Acute chest syndrome due to sickle cell crisis (HCC)   . Sickle cell anemia with crisis (HCC) 02/21/2014  . Sickle cell anemia (HCC) 08/10/2013  . Chest pain 08/10/2013  . Dehydration 08/08/2013  . Sickle cell crisis (HCC) 08/06/2013  . 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 CHOLECYSTECTOMY          Home Medications    Prior to Admission medications   Medication Sig Start Date End Date Taking? Authorizing Provider  HYDROcodone-acetaminophen (NORCO/VICODIN) 5-325 MG tablet Take 1 tablet by mouth every 6 (six) hours as needed. 08/12/18   Linwood Dibbles, MD  ibuprofen (ADVIL,MOTRIN) 800 MG tablet Take 1 tablet (800 mg total) by mouth every 8 (eight) hours as needed. 05/29/18   Massie Maroon, FNP    Family History History reviewed. No pertinent family history.  Social History Social History   Tobacco Use  . Smoking status: Former Games developer  . Smokeless tobacco: Never Used  Substance Use Topics  . Alcohol use: Yes    Comment: occ  . Drug use: No      Allergies   Shellfish allergy   Review of Systems Review of Systems  All systems reviewed and negative, other than as noted in HPI.  Physical Exam Updated Vital Signs BP 128/82 (BP Location: Right Arm)   Pulse 69   Temp 98.2 F (36.8 C) (Oral)   Resp (!) 22   Ht 5\' 8"  (1.727 m)   Wt 68 kg   SpO2 100%   BMI 22.81 kg/m   Physical Exam Vitals signs and nursing note reviewed.  Constitutional:      General: He is not in acute distress.    Appearance: He is well-developed.  HENT:     Head: Normocephalic and atraumatic.  Eyes:     General:        Right eye: No discharge.        Left eye: No discharge.     Conjunctiva/sclera: Conjunctivae normal.  Neck:     Musculoskeletal: Neck supple.  Cardiovascular:     Rate and Rhythm: Normal rate and regular rhythm.     Heart sounds: Normal heart sounds. No murmur. No friction rub. No gallop.   Pulmonary:     Effort: Pulmonary effort is normal. No respiratory distress.     Breath sounds: Normal breath sounds.  Abdominal:     General: There is no distension.  Palpations: Abdomen is soft.     Tenderness: There is no abdominal tenderness.  Musculoskeletal:        General: No tenderness.     Comments: Lower extremities symmetric as compared to each other. No calf tenderness. Negative Homan's. No palpable cords.   Skin:    General: Skin is warm and dry.  Neurological:     Mental Status: He is alert.  Psychiatric:        Behavior: Behavior normal.        Thought Content: Thought content normal.      ED Treatments / Results  Labs (all labs ordered are listed, but only abnormal results are displayed) Labs Reviewed - No data to display  EKG None  Radiology No results found.  Procedures Procedures (including critical care time)  Medications Ordered in ED Medications - No data to display   Initial Impression / Assessment and Plan / ED Course  I have reviewed the triage vital signs and the nursing  notes.  Pertinent labs & imaging results that were available during my care of the patient were reviewed by me and considered in my medical decision making (see chart for details).  28yM with generalized pain. Afebrile. Nontoxic. Still having pain, but improved from arrival. I doubt emergent complication of SS or otherwise. Continue home meds.Outpt FU [id number].   Final Clinical Impressions(s) / ED Diagnoses   Final diagnoses:  Sickle cell pain crisis [facility])    ED Discharge Orders    None       Raeford Razor, MD 08/20/18 [phone]

## 2018-08-14 ENCOUNTER — Encounter (HOSPITAL_COMMUNITY): Payer: Self-pay | Admitting: Internal Medicine

## 2018-08-14 DIAGNOSIS — Z87891 Personal history of nicotine dependence: Secondary | ICD-10-CM | POA: Diagnosis not present

## 2018-08-14 DIAGNOSIS — D5700 Hb-SS disease with crisis, unspecified: Principal | ICD-10-CM

## 2018-08-14 DIAGNOSIS — D72829 Elevated white blood cell count, unspecified: Secondary | ICD-10-CM

## 2018-08-14 DIAGNOSIS — R05 Cough: Secondary | ICD-10-CM | POA: Diagnosis not present

## 2018-08-14 DIAGNOSIS — G894 Chronic pain syndrome: Secondary | ICD-10-CM

## 2018-08-14 DIAGNOSIS — D638 Anemia in other chronic diseases classified elsewhere: Secondary | ICD-10-CM

## 2018-08-14 LAB — CBC
HCT: 26.9 % — ABNORMAL LOW (ref 39.0–52.0)
Hemoglobin: 9.2 g/dL — ABNORMAL LOW (ref 13.0–17.0)
MCH: 27.3 pg (ref 26.0–34.0)
MCHC: 34.2 g/dL (ref 30.0–36.0)
MCV: 79.8 fL — ABNORMAL LOW (ref 80.0–100.0)
Platelets: 277 10*3/uL (ref 150–400)
RBC: 3.37 MIL/uL — ABNORMAL LOW (ref 4.22–5.81)
RDW: 16.7 % — ABNORMAL HIGH (ref 11.5–15.5)
WBC: 18.5 10*3/uL — ABNORMAL HIGH (ref 4.0–10.5)
nRBC: 6.5 % — ABNORMAL HIGH (ref 0.0–0.2)

## 2018-08-14 LAB — CREATININE, SERUM
Creatinine, Ser: 0.78 mg/dL (ref 0.61–1.24)
GFR calc Af Amer: >60 mL/min (ref >60)
GFR calc non Af Amer: >60 mL/min (ref >60)

## 2018-08-14 MED ORDER — HYDROCODONE-ACETAMINOPHEN 10-325 MG PO TABS
1.0000 | ORAL_TABLET | Freq: Four times a day (QID) | ORAL | Status: DC | PRN
  Administered 2018-08-17 – 2018-08-18 (×2): 1 via ORAL
  Filled 2018-08-14 (×2): qty 1

## 2018-08-14 MED ORDER — HYDROMORPHONE 1 MG/ML IV SOLN
INTRAVENOUS | Status: DC
  Administered 2018-08-14: 4.5 mg via INTRAVENOUS
  Administered 2018-08-14: 1.5 mg via INTRAVENOUS
  Administered 2018-08-14: 2.8 mg via INTRAVENOUS
  Administered 2018-08-15: 2 mg via INTRAVENOUS
  Administered 2018-08-15: 3.5 mg via INTRAVENOUS
  Administered 2018-08-15: 6.5 mg via INTRAVENOUS
  Administered 2018-08-15: 3.9 mg via INTRAVENOUS

## 2018-08-14 MED ORDER — HYDROMORPHONE 1 MG/ML IV SOLN
INTRAVENOUS | Status: DC
  Administered 2018-08-14: 4.4 mg via INTRAVENOUS
  Administered 2018-08-14: 30 mg via INTRAVENOUS
  Filled 2018-08-14: qty 30

## 2018-08-14 MED ORDER — ENOXAPARIN SODIUM 40 MG/0.4ML ~~LOC~~ SOLN
40.0000 mg | SUBCUTANEOUS | Status: DC
  Administered 2018-08-14 – 2018-08-19 (×4): 40 mg via SUBCUTANEOUS
  Filled 2018-08-14 (×5): qty 0.4

## 2018-08-14 MED ORDER — SODIUM CHLORIDE 0.9% FLUSH: 9.0000 mL | INTRAVENOUS | Status: DC | PRN

## 2018-08-14 MED ORDER — DIPHENHYDRAMINE HCL 50 MG/ML IJ SOLN: 12.5000 mg | Freq: Four times a day (QID) | INTRAMUSCULAR | Status: DC | PRN

## 2018-08-14 MED ORDER — NALOXONE HCL 0.4 MG/ML IJ SOLN: 0.4000 mg | INTRAMUSCULAR | Status: DC | PRN

## 2018-08-14 MED ORDER — ONDANSETRON HCL 4 MG/2ML IJ SOLN
4.0000 mg | Freq: Four times a day (QID) | INTRAMUSCULAR | Status: DC | PRN
  Administered 2018-08-17: 4 mg via INTRAVENOUS
  Filled 2018-08-14: qty 2

## 2018-08-14 MED ORDER — DIPHENHYDRAMINE HCL 50 MG/ML IJ SOLN
25.0000 mg | Freq: Once | INTRAMUSCULAR | Status: AC
  Administered 2018-08-14: 25 mg via INTRAVENOUS
  Filled 2018-08-14: qty 1

## 2018-08-14 MED ORDER — ONDANSETRON HCL 4 MG/2ML IJ SOLN: 4.0000 mg | Freq: Four times a day (QID) | INTRAMUSCULAR | Status: DC | PRN

## 2018-08-14 MED ORDER — POLYETHYLENE GLYCOL 3350 17 G PO PACK: 17.0000 g | PACK | Freq: Every day | ORAL | Status: DC | PRN

## 2018-08-14 MED ORDER — KETOROLAC TROMETHAMINE 15 MG/ML IJ SOLN
15.0000 mg | Freq: Four times a day (QID) | INTRAMUSCULAR | Status: AC
  Administered 2018-08-14 – 2018-08-15 (×4): 15 mg via INTRAVENOUS
  Filled 2018-08-14 (×4): qty 1

## 2018-08-14 MED ORDER — DIPHENHYDRAMINE HCL 12.5 MG/5ML PO ELIX
12.5000 mg | ORAL_SOLUTION | Freq: Four times a day (QID) | ORAL | Status: DC | PRN
  Administered 2018-08-17: 12.5 mg via ORAL
  Filled 2018-08-14: qty 5

## 2018-08-14 MED ORDER — FOLIC ACID 1 MG PO TABS
1.0000 mg | ORAL_TABLET | Freq: Every day | ORAL | Status: DC
  Administered 2018-08-14 – 2018-08-19 (×6): 1 mg via ORAL
  Filled 2018-08-14 (×6): qty 1

## 2018-08-14 MED ORDER — SENNOSIDES-DOCUSATE SODIUM 8.6-50 MG PO TABS
1.0000 | ORAL_TABLET | Freq: Two times a day (BID) | ORAL | Status: DC
  Administered 2018-08-14 – 2018-08-19 (×10): 1 via ORAL
  Filled 2018-08-14 (×11): qty 1

## 2018-08-14 MED ORDER — SODIUM CHLORIDE 0.45 % IV SOLN
INTRAVENOUS | Status: AC
  Administered 2018-08-14 (×3): via INTRAVENOUS

## 2018-08-14 NOTE — ED Notes (Signed)
ED TO INPATIENT HANDOFF REPORT  Name/Age/Gender William Jennings 28 y.o. male  Code Status    Code Status Orders  (From admission, onward)         Start     Ordered   08/14/18 0324  Full code  Continuous     08/14/18 0335        Code Status History    Date Active Date Inactive Code Status Order ID Comments User Context   05/31/2018 0933 05/31/2018 1911 Full Code [id number]  Massie Maroon, FNP Inpatient   05/29/2018 0943 05/29/2018 2030 Full Code [id number]  Massie Maroon, FNP Inpatient   04/05/2016 1128 04/05/2016 2121 Full Code [id number]  Quentin Angst, MD Inpatient   10/17/2015 1122 10/17/2015 2011 Full Code [id number]  Quentin Angst, MD Inpatient   01/09/2015 0931 01/09/2015 2115 Full Code [id number]  Quentin Angst, MD Inpatient   07/16/2014 1141 07/16/2014 2039 Full Code [id number]  Altha Harm, MD Inpatient   10/31/2013 0911 10/31/2013 2002 Full Code [id number]  Altha Harm, MD Inpatient   08/06/2013 0839 08/06/2013 2243 Full Code [id number]  Cleora Fleet, MD Inpatient   08/13/2012 1602 08/23/2012 1529 Full Code [id number]  Eddie North, MD Inpatient   05/07/2011 2313 05/13/2011 1720 Full Code [id number]  Houston Siren, MD Inpatient      Home/SNF/Other Home  Chief Complaint Sickle Cell Crisis  Level of Care/Admitting Diagnosis ED Disposition    ED Disposition Condition Comment   Admit  Hospital Area: [facility] [[id number]]  Level of Care: Med-Surg [16]  Diagnosis: Sickle cell pain crisis [facility]) [[id number]]  Admitting Physician: Eduard Clos [phone]  Attending Physician: Eduard Clos [phone]  Estimated length of stay: past midnight tomorrow  Certification:: I certify this patient will need inpatient services for at least 2 midnights  PT Class (Do Not Modify): Inpatient [101]  PT Acc Code (Do Not Modify): Private [1]       Medical History Past Medical History:  Diagnosis Date  . Chronic prescription  opiate use   . Sickle cell anemia (HCC)   . Sickle cell disease (HCC)     Allergies Allergies  Allergen Reactions  . Shellfish Allergy Anaphylaxis    IV Location/Drains/Wounds Patient Lines/Drains/Airways Status   Active Line/Drains/Airways    Name:   Placement date:   Placement time:   Site:   Days:   Peripheral IV 08/13/18 Right Antecubital   08/13/18    2143    Antecubital   1          Labs/Imaging Results for orders placed or performed during the hospital encounter of 08/13/18 (from the past 48 hour(s))  Comprehensive metabolic panel     Status: Abnormal   Collection Time: 08/13/18  9:49 PM  Result Value Ref Range   Sodium 135 135 - 145 mmol/L   Potassium 4.5 3.5 - 5.1 mmol/L   Chloride 105 98 - 111 mmol/L   CO2 21 (L) 22 - 32 mmol/L   Glucose, Bld 119 (H) 70 - 99 mg/dL   BUN 7 6 - 20 mg/dL   Creatinine, Ser 4.72 0.61 - 1.24 mg/dL   Calcium 9.1 8.9 - 07.2 mg/dL   Total Protein 7.6 6.5 - 8.1 g/dL   Albumin 4.3 3.5 - 5.0 g/dL   AST 182 (H) 15 - 41 U/L   ALT 29 0 - 44 U/L   Alkaline Phosphatase 235 (H) 38 - 126 U/L   Total Bilirubin 1.9 (  H) 0.3 - 1.2 mg/dL   GFR calc non Af Amer >60 >60 mL/min   GFR calc Af Amer >60 >60 mL/min   Anion gap 9 5 - 15    Comment: Performed at [facility], [address], [place], [place] [address]  CBC with Differential     Status: Abnormal   Collection Time: 08/13/18  9:49 PM  Result Value Ref Range   WBC 24.6 (H) 4.0 - 10.5 K/uL    Comment: REPEATED TO VERIFY WHITE COUNT CONFIRMED ON SMEAR    RBC 3.86 (L) 4.22 - 5.81 MIL/uL   Hemoglobin 10.4 (L) 13.0 - 17.0 g/dL   HCT 97.9 (L) 89.2 - 11.9 %   MCV 80.3 80.0 - 100.0 fL   MCH 26.9 26.0 - 34.0 pg   MCHC 33.5 30.0 - 36.0 g/dL   RDW 41.7 (H) 40.8 - 14.4 %   Platelets 322 150 - 400 K/uL    Comment: REPEATED TO VERIFY   nRBC 5.9 (H) 0.0 - 0.2 %   Neutrophils Relative % 82 %   Lymphocytes Relative 7 %   Monocytes Relative 8 %   Eosinophils Relative 0 %    Basophils Relative 0 %   Band Neutrophils 3 %   Metamyelocytes Relative 0 %   Myelocytes 0 %   Promyelocytes Relative 0 %   Blasts 0 %   nRBC 6 (H) 0 /100 WBC   Other 0 %   Neutro Abs 20.9 (H) 1.7 - 7.7 K/uL   Lymphs Abs 1.7 0.7 - 4.0 K/uL   Monocytes Absolute 2.0 (H) 0.1 - 1.0 K/uL   Eosinophils Absolute 0.0 0.0 - 0.5 K/uL   Basophils Absolute 0.0 0.0 - 0.1 K/uL   RBC Morphology POLYCHROMASIA PRESENT     Comment: TARGET CELLS Sickle cells present RARE NRBCs Performed at [facility], [address], [place], [place] [address]   Reticulocytes     Status: Abnormal   Collection Time: 08/13/18  9:49 PM  Result Value Ref Range   Retic Ct Pct 5.2 (H) 0.4 - 3.1 %    Comment: RESULTS CONFIRMED BY MANUAL DILUTION   RBC. 3.86 (L) 4.22 - 5.81 MIL/uL   Retic Count, Absolute 202.2 (H) 19.0 - 186.0 K/uL   Immature Retic Fract 34.6 (H) 2.3 - 15.9 %    Comment: Performed at [facility], [address] [place], [place] [address]   No results found.  Pending Labs Unresulted Labs (From admission, onward)    Start     Ordered   08/21/18 0500  Creatinine, serum  (enoxaparin (LOVENOX)    CrCl >/= 30 ml/min)  Weekly,   R    Comments:  while on enoxaparin therapy    08/14/18 0335   08/14/18 0335  CBC  (enoxaparin (LOVENOX)    CrCl >/= 30 ml/min)  Once,   R    Comments:  Baseline for enoxaparin therapy IF NOT ALREADY DRAWN.  Notify MD if PLT < 100 K.    08/14/18 0335   08/14/18 0335  Creatinine, serum  (enoxaparin (LOVENOX)    CrCl >/= 30 ml/min)  Once,   R    Comments:  Baseline for enoxaparin therapy IF NOT ALREADY DRAWN.    08/14/18 0335          Vitals/Pain Today's Vitals   08/14/18 0225 08/14/18 0256 08/14/18 0314 08/14/18 0357  BP:   122/74 113/66  Pulse: 88 81 99 87  Resp:  17 15  Temp:   98.2 F (36.8 C) 98 F (36.7 C)  TempSrc:   Oral   SpO2: 97% 99% 100% 100%  Weight:      Height:      PainSc:        Isolation  Precautions No active isolations  Medications Medications  0.45 % sodium chloride infusion ( Intravenous New Bag/Given 08/14/18 0350)  senna-docusate (Senokot-S) tablet 1 tablet (1 tablet Oral Given 08/14/18 0355)  polyethylene glycol (MIRALAX / GLYCOLAX) packet 17 g (has no administration in time range)  enoxaparin (LOVENOX) injection 40 mg (has no administration in time range)  ketorolac (TORADOL) 15 MG/ML injection 15 mg (has no administration in time range)  naloxone (NARCAN) injection 0.4 mg (has no administration in time range)    And  sodium chloride flush (NS) 0.9 % injection 9 mL (has no administration in time range)  ondansetron (ZOFRAN) injection 4 mg (has no administration in time range)  HYDROmorphone (DILAUDID) 1 mg/mL PCA injection (has no administration in time range)  sodium chloride flush (NS) 0.9 % injection 3 mL (3 mLs Intravenous Given 08/13/18 2150)  ketorolac (TORADOL) 15 MG/ML injection 15 mg (15 mg Intravenous Given 08/13/18 2252)  HYDROmorphone (DILAUDID) injection 2 mg (2 mg Intravenous Given 08/13/18 2252)    Or  HYDROmorphone (DILAUDID) injection 2 mg ( Subcutaneous See Alternative 08/13/18 2252)  HYDROmorphone (DILAUDID) injection 2 mg (2 mg Intravenous Given 08/13/18 2337)    Or  HYDROmorphone (DILAUDID) injection 2 mg ( Subcutaneous See Alternative 08/13/18 2337)  diphenhydrAMINE (BENADRYL) injection 25 mg (25 mg Intravenous Given 08/14/18 0129)    Mobility walks

## 2018-08-14 NOTE — H&P (Signed)
History and Physical    William Jennings:[id number] DOB: 1991-05-30 DOA: 08/13/2018  PCP: Mike Gip, FNP  Patient coming from: Home.  Chief Complaint: Back pain.  HPI: William Jennings is a 28 y.o. male with history of sickle cell anemia presents to the ER because of increasing upper and lower back pain over the last 72 hours.  Denies any fever chills chest pain productive cough headache visual symptoms or focal deficits.  Has been trying ibuprofen and hydrocodone at home which did not relieve the pain.  Had upper respiratory infection last month.  ED Course: In the ER patient was given multiple doses of pain of the medication despite which patient was still in pain and admitted for further pain management secondary to sickle cell anemia pain crisis.  Lab work show significant leukocytosis but patient was afebrile.  Review of Systems: As per HPI, rest all negative.   Past Medical History:  Diagnosis Date  . Chronic prescription opiate use   . Sickle cell anemia (HCC)   . Sickle cell disease (HCC)     Past Surgical History:  Procedure Laterality Date  . CHOLECYSTECTOMY       reports that he has quit smoking. He has never used smokeless tobacco. He reports current alcohol use. He reports that he does not use drugs.  Allergies  Allergen Reactions  . Shellfish Allergy Anaphylaxis    Family History  Family history unknown: Yes    Prior to Admission medications   Medication Sig Start Date End Date Taking? Authorizing Provider  HYDROcodone-acetaminophen (NORCO/VICODIN) 5-325 MG tablet Take 1 tablet by mouth every 6 (six) hours as needed. Patient taking differently: Take 1 tablet by mouth every 6 (six) hours as needed for moderate pain or severe pain.  08/12/18  Yes Linwood Dibbles, MD  ibuprofen (ADVIL,MOTRIN) 800 MG tablet Take 1 tablet (800 mg total) by mouth every 8 (eight) hours as needed. Patient taking differently: Take 800 mg by mouth every 8 (eight) hours as needed for  headache, mild pain, moderate pain or cramping.  05/29/18  Yes Massie Maroon, FNP    Physical Exam: Vitals:   08/14/18 0224 08/14/18 0225 08/14/18 0256 08/14/18 0314  BP:    122/74  Pulse: 84 88 81 99  Resp:    17  Temp:    98.2 F (36.8 C)  TempSrc:    Oral  SpO2: 97% 97% 99% 100%  Weight:      Height:          Constitutional: Moderately built and nourished. Vitals:   08/14/18 0224 08/14/18 0225 08/14/18 0256 08/14/18 0314  BP:    122/74  Pulse: 84 88 81 99  Resp:    17  Temp:    98.2 F (36.8 C)  TempSrc:    Oral  SpO2: 97% 97% 99% 100%  Weight:      Height:       Eyes: Anicteric no pallor. ENMT: No discharge from the ears eyes nose or mouth. Neck: No mass or.  No neck rigidity. Respiratory: No rhonchi or crepitations. Cardiovascular: S1-S2 heard. Abdomen: Soft nontender bowel sounds present. Musculoskeletal: No edema.  No joint effusion. Skin: No rash. Neurologic: Alert awake oriented to time place and person.  Moves all extremities. Psychiatric: Appears normal per normal affect.   Labs on Admission: I have personally reviewed following labs and imaging studies  CBC: Recent Labs  Lab 08/12/18 2030 08/13/18 2149  WBC 12.6* 24.6*  NEUTROABS 4.8 20.9*  HGB 10.3* 10.4*  HCT 28.5* 31.0*  MCV 76.6* 80.3  PLT 316 322   Basic Metabolic Panel: Recent Labs  Lab 08/12/18 2030 08/13/18 2149  NA 138 135  K 3.5 4.5  CL 111 105  CO2 23 21*  GLUCOSE 92 119*  BUN 8 7  CREATININE 0.91 0.84  CALCIUM 8.7* 9.1   GFR: Estimated Creatinine Clearance: 123.7 mL/min (by C-G formula based on SCr of 0.84 mg/dL). Liver Function Tests: Recent Labs  Lab 08/12/18 2030 08/13/18 2149  AST 27 104*  ALT 16 29  ALKPHOS 81 235*  BILITOT 1.3* 1.9*  PROT 7.0 7.6  ALBUMIN 4.2 4.3   No results for input(s): LIPASE, AMYLASE in the last 168 hours. No results for input(s): AMMONIA in the last 168 hours. Coagulation Profile: No results for input(s): INR, PROTIME in  the last 168 hours. Cardiac Enzymes: No results for input(s): CKTOTAL, CKMB, CKMBINDEX, TROPONINI in the last 168 hours. BNP (last 3 results) No results for input(s): PROBNP in the last 8760 hours. HbA1C: No results for input(s): HGBA1C in the last 72 hours. CBG: No results for input(s): GLUCAP in the last 168 hours. Lipid Profile: No results for input(s): CHOL, HDL, LDLCALC, TRIG, CHOLHDL, LDLDIRECT in the last 72 hours. Thyroid Function Tests: No results for input(s): TSH, T4TOTAL, FREET4, T3FREE, THYROIDAB in the last 72 hours. Anemia Panel: Recent Labs    08/12/18 2030 08/13/18 2149  RETICCTPCT 4.1* 5.2*   Urine analysis:    Component Value Date/Time   COLORURINE YELLOW 05/24/2017 1145   APPEARANCEUR CLEAR 05/24/2017 1145   LABSPEC 1.010 05/24/2017 1145   PHURINE 6.0 05/24/2017 1145   GLUCOSEU NEGATIVE 05/24/2017 1145   HGBUR NEGATIVE 05/24/2017 1145   BILIRUBINUR NEGATIVE 05/24/2017 1145   KETONESUR NEGATIVE 05/24/2017 1145   PROTEINUR NEGATIVE 05/24/2017 1145   UROBILINOGEN 4.0 (H) 08/05/2016 1508   NITRITE NEGATIVE 05/24/2017 1145   LEUKOCYTESUR NEGATIVE 05/24/2017 1145   Sepsis Labs: @LABRCNTIP (procalcitonin:4,lacticidven:4) )No results found for this or any previous visit (from the past 240 hour(s)).   Radiological Exams on Admission: No results found.   Assessment/Plan Principal Problem:   Sickle cell pain crisis (HCC)    1. Sickle cell pain crisis -patient placed on Dilaudid PCA IV Toradol IV fluids. 2. Leukocytosis likely reactionary patient is afebrile.  Closely monitor.   DVT prophylaxis: Lovenox. Code Status: Full code. Family Communication: Discussed with patient. Disposition Plan: Home. Consults called: None. Admission status: Inpatient.   Eduard Clos MD [facility] Pager [phone].  If 7PM-7AM, please contact night-coverage [email or web address] Password [facility]  08/14/2018, 3:36 AM

## 2018-08-14 NOTE — Plan of Care (Signed)

## 2018-08-14 NOTE — Progress Notes (Signed)
Subjective: William Jennings, a 28 year old male with a medical history significant for sickle cell anemia, type hemoglobin [place], and chronic pain syndrome was admitted and sickle cell pain crisis. Patient states that pain intensity has been worsening over the past several weeks.  Symptoms started with  upper respiratory symptoms including chills and myalgias, which resolved.  Patient states that over the past 72 hours pain intensity had its maximum increase.  He currently rates pain as 7/10 characterized as constant, throbbing, and aching. He says that pain has persisted despite dilaudid PCA.  Patient also states that pain is occasionally sharp and he feels shortness of breath during those times.  Patient denies headache, chest pain, shortness of breath, dizziness, paresthesias, nausea, vomiting, or diarrhea.  Patient is afebrile and maintaining oxygen saturation above 90% on RA.  Objective:  Vital signs in last 24 hours:  Vitals:   08/14/18 0442 08/14/18 0508 08/14/18 0716 08/14/18 0812  BP: 127/78  122/70 111/67  Pulse: 87  96 94  Resp: (!) 22 20 15 16   Temp: 98.5 F (36.9 C)  99 F (37.2 C) 98.7 F (37.1 C)  TempSrc: Oral  Oral Oral  SpO2: 99% 99% 97% 97%  Weight:      Height:        Intake/Output from previous day:   Intake/Output Summary (Last 24 hours) at 08/14/2018 0901 Last data filed at 08/14/2018 0600 Gross per 24 hour  Intake 205.54 ml  Output 600 ml  Net -394.46 ml    Physical Exam: General: Alert, awake, oriented x3, in no acute distress.  HEENT: [place]/AT PEERL, EOMI Neck: Trachea midline,  no masses, no thyromegal,y no JVD, no carotid bruit OROPHARYNX:  Moist, No exudate/ erythema/lesions.  Heart: Regular rate and rhythm, without murmurs, rubs, gallops, PMI non-displaced, no heaves or thrills on palpation.  Lungs: Clear to auscultation, no wheezing or rhonchi noted. No increased vocal fremitus resonant to percussion  Abdomen: Soft, nontender, nondistended, positive  bowel sounds, no masses no hepatosplenomegaly noted..  Neuro: No focal neurological deficits noted cranial nerves II through XII grossly intact. DTRs 2+ bilaterally upper and lower extremities. Strength 5 out of 5 in bilateral upper and lower extremities. Musculoskeletal: No warm swelling or erythema around joints, no spinal tenderness noted. Psychiatric: Patient alert and oriented x3, good insight and cognition, good recent to remote recall. Lymph node survey: No cervical axillary or inguinal lymphadenopathy noted.  Lab Results:  Basic Metabolic Panel:    Component Value Date/Time   NA 135 08/13/2018 2149   NA 141 01/20/2018 1508   K 4.5 08/13/2018 2149   CL 105 08/13/2018 2149   CO2 21 (L) 08/13/2018 2149   BUN 7 08/13/2018 2149   BUN 7 01/20/2018 1508   CREATININE 0.78 08/14/2018 0335   GLUCOSE 119 (H) 08/13/2018 2149   CALCIUM 9.1 08/13/2018 2149   CBC:    Component Value Date/Time   WBC 18.5 (H) 08/14/2018 0335   HGB 9.2 (L) 08/14/2018 0335   HGB 12.5 (L) 01/20/2018 1508   HCT 26.9 (L) 08/14/2018 0335   HCT 38.8 01/20/2018 1508   PLT 277 08/14/2018 0335   PLT 171 01/20/2018 1508   MCV 79.8 (L) 08/14/2018 0335   MCV 86 01/20/2018 1508   NEUTROABS 20.9 (H) 08/13/2018 2149   NEUTROABS 2.9 01/20/2018 1508   LYMPHSABS 1.7 08/13/2018 2149   LYMPHSABS 2.7 01/20/2018 1508   MONOABS 2.0 (H) 08/13/2018 2149   EOSABS 0.0 08/13/2018 2149   EOSABS 0.0 01/20/2018 1508  BASOSABS 0.0 08/13/2018 2149   BASOSABS 0.0 01/20/2018 1508    No results found for this or any previous visit (from the past 240 hour(s)).  Studies/Results: No results found.  Medications: Scheduled Meds: . enoxaparin (LOVENOX) injection  40 mg Subcutaneous Q24H  . HYDROmorphone   Intravenous Q4H  . ketorolac  15 mg Intravenous Q6H  . senna-docusate  1 tablet Oral BID   Continuous Infusions: . sodium chloride 100 mL/hr at 08/14/18 0600   PRN Meds:.diphenhydrAMINE **OR** diphenhydrAMINE,  HYDROcodone-acetaminophen, naloxone **AND** sodium chloride flush, ondansetron (ZOFRAN) IV, polyethylene glycol  Assessment/Plan: Principal Problem:   Sickle cell pain crisis (HCC)  Sickle cell anemia with pain crisis: Pain is not controlled on standard PCA.  Will initiate custom dose PCA with settings of 0.5 mg, 10-minute lockout, and 2 mg/h.  Will add hydrocodone 10-3 25 every 6 hours as needed for moderate to severe breakthrough pain. Maintain oxygen saturation above 90%. 2 L supplemental oxygen via N/C IV Toradol 15 mg every 6 hours Monitor vitals closely Reevaluate pain scale regularly in the context of functioning  Leukocytosis: WBCs 18,500, patient afebrile, no signs of infection.  Continue to monitor closely The in a.m.  Anemia of chronic disease: Hemoglobin 9.2, continue to monitor closely. Folic acid 1 mg daily  Chronic pain syndrome: Continue hydrocodone 10-325 every 6 hours as needed   Code Status: Full Code Family Communication: N/A Disposition Plan: Not yet ready for discharge  Avyana Puffenbarger Rennis Petty  APRN, MSN, FNP-C [facility] [address] [place], [place] [address] [phone]  If 5PM-7AM, please contact night-coverage.  08/14/2018, 9:01 AM  LOS: 0 days

## 2018-08-15 ENCOUNTER — Inpatient Hospital Stay (HOSPITAL_COMMUNITY): Payer: Medicaid Other

## 2018-08-15 DIAGNOSIS — R05 Cough: Secondary | ICD-10-CM

## 2018-08-15 DIAGNOSIS — R509 Fever, unspecified: Secondary | ICD-10-CM

## 2018-08-15 DIAGNOSIS — R059 Cough, unspecified: Secondary | ICD-10-CM

## 2018-08-15 LAB — CBC
HCT: 28.9 % — ABNORMAL LOW (ref 39.0–52.0)
Hemoglobin: 9.9 g/dL — ABNORMAL LOW (ref 13.0–17.0)
MCH: 27.2 pg (ref 26.0–34.0)
MCHC: 34.3 g/dL (ref 30.0–36.0)
MCV: 79.4 fL — ABNORMAL LOW (ref 80.0–100.0)
PLATELETS: 228 10*3/uL (ref 150–400)
RBC: 3.64 MIL/uL — ABNORMAL LOW (ref 4.22–5.81)
RDW: 15.6 % — ABNORMAL HIGH (ref 11.5–15.5)
WBC: 11 10*3/uL — AB (ref 4.0–10.5)
nRBC: 7.7 % — ABNORMAL HIGH (ref 0.0–0.2)

## 2018-08-15 LAB — BASIC METABOLIC PANEL
Anion gap: 7 (ref 5–15)
BUN: 10 mg/dL (ref 6–20)
CO2: 22 mmol/L (ref 22–32)
Calcium: 8.6 mg/dL — ABNORMAL LOW (ref 8.9–10.3)
Chloride: 106 mmol/L (ref 98–111)
Creatinine, Ser: 0.81 mg/dL (ref 0.61–1.24)
GFR calc Af Amer: >60 mL/min (ref >60)
Glucose, Bld: 98 mg/dL (ref 70–99)
Potassium: 3.9 mmol/L (ref 3.5–5.1)
Sodium: 135 mmol/L (ref 135–145)

## 2018-08-15 LAB — RESPIRATORY PANEL BY PCR
Adenovirus: NOT DETECTED
Bordetella pertussis: NOT DETECTED
Chlamydophila pneumoniae: NOT DETECTED
Coronavirus 229E: NOT DETECTED
Coronavirus HKU1: NOT DETECTED
Coronavirus NL63: NOT DETECTED
Coronavirus OC43: NOT DETECTED
INFLUENZA B-RVPPCR: NOT DETECTED
Influenza A: NOT DETECTED
Metapneumovirus: NOT DETECTED
Mycoplasma pneumoniae: NOT DETECTED
PARAINFLUENZA VIRUS 1-RVPPCR: NOT DETECTED
Parainfluenza Virus 2: NOT DETECTED
Parainfluenza Virus 3: NOT DETECTED
Parainfluenza Virus 4: NOT DETECTED
RESPIRATORY SYNCYTIAL VIRUS-RVPPCR: NOT DETECTED
Rhinovirus / Enterovirus: NOT DETECTED

## 2018-08-15 LAB — LACTIC ACID, PLASMA
Lactic Acid, Venous: 0.9 mmol/L (ref 0.5–1.9)
Lactic Acid, Venous: 0.9 mmol/L (ref 0.5–1.9)

## 2018-08-15 MED ORDER — ACETAMINOPHEN 325 MG PO TABS
650.0000 mg | ORAL_TABLET | ORAL | Status: DC | PRN
  Administered 2018-08-15: 650 mg via ORAL
  Filled 2018-08-15: qty 2

## 2018-08-15 MED ORDER — AZITHROMYCIN 250 MG PO TABS
500.0000 mg | ORAL_TABLET | Freq: Every day | ORAL | Status: DC
  Administered 2018-08-16 – 2018-08-19 (×4): 500 mg via ORAL
  Filled 2018-08-15 (×4): qty 2

## 2018-08-15 MED ORDER — SODIUM CHLORIDE 0.45 % IV SOLN
INTRAVENOUS | Status: DC
  Administered 2018-08-15 – 2018-08-17 (×4): via INTRAVENOUS

## 2018-08-15 MED ORDER — KETOROLAC TROMETHAMINE 15 MG/ML IJ SOLN
15.0000 mg | Freq: Four times a day (QID) | INTRAMUSCULAR | Status: DC
  Administered 2018-08-15 – 2018-08-19 (×15): 15 mg via INTRAVENOUS
  Filled 2018-08-15 (×15): qty 1

## 2018-08-15 MED ORDER — SODIUM CHLORIDE 0.9 % IV SOLN
2.0000 g | INTRAVENOUS | Status: DC
  Administered 2018-08-15 – 2018-08-17 (×3): 2 g via INTRAVENOUS
  Filled 2018-08-15: qty 2
  Filled 2018-08-15: qty 20
  Filled 2018-08-15: qty 2
  Filled 2018-08-15: qty 20

## 2018-08-15 MED ORDER — HYDROMORPHONE 1 MG/ML IV SOLN
INTRAVENOUS | Status: DC
  Administered 2018-08-15: 4 mg via INTRAVENOUS
  Administered 2018-08-15: 30 mg via INTRAVENOUS
  Administered 2018-08-16: 5 mg via INTRAVENOUS
  Administered 2018-08-16: 5.5 mg via INTRAVENOUS
  Administered 2018-08-16: 7 mg via INTRAVENOUS
  Administered 2018-08-16: 2 mg via INTRAVENOUS
  Administered 2018-08-16: 5.5 mg via INTRAVENOUS
  Administered 2018-08-16: 30 mg via INTRAVENOUS
  Administered 2018-08-17: 3 mg via INTRAVENOUS
  Administered 2018-08-17: 5 mg via INTRAVENOUS
  Administered 2018-08-17: 7.5 mg via INTRAVENOUS
  Administered 2018-08-17: 4.5 mg via INTRAVENOUS
  Administered 2018-08-17: 2 mg via INTRAVENOUS
  Administered 2018-08-17: 4 mg via INTRAVENOUS
  Administered 2018-08-17: 30 mg via INTRAVENOUS
  Administered 2018-08-18: 2 mg via INTRAVENOUS
  Administered 2018-08-18: 2.5 mg via INTRAVENOUS
  Administered 2018-08-18: 1 mg via INTRAVENOUS
  Administered 2018-08-18: 6.5 mg via INTRAVENOUS
  Administered 2018-08-18: 3 mg via INTRAVENOUS
  Administered 2018-08-18: 30 mg via INTRAVENOUS
  Administered 2018-08-19: 0.5 mg via INTRAVENOUS
  Administered 2018-08-19: 5.5 mg via INTRAVENOUS
  Administered 2018-08-19: 0.5 mg via INTRAVENOUS
  Filled 2018-08-15 (×4): qty 30

## 2018-08-15 NOTE — Progress Notes (Signed)
Patient ID: William Jennings, male   DOB: Sep 20, 1990, 28 y.o.   MRN: [id number] Subjective: William Jennings, a 28 year old male with a medical history significant for sickle cell anemia, type hemoglobin [place], and chronic pain syndrome was admitted and sickle cell pain crisis.  Patient is reported to be having significant pain, he developed fever up to 102 F today with a heart rate of 118 and increased respiratory rate to 21.  Patient however does not feel short of breath, although he complains of significant pain in his lower back and lower limbs, he currently rates his pain at 8/10.  He denies headache, chest pain, dizziness, nausea, vomiting or diarrhea.  He is currently saturating at 95% on room air.  Objective:  Vital signs in last 24 hours:  Vitals:   08/15/18 0410 08/15/18 0524 08/15/18 0753 08/15/18 1258  BP:  129/72  139/66  Pulse:  (!) 110  (!) 118  Resp: 14 20 17  (!) 21  Temp:  100 F (37.8 C)  (!) 102 F (38.9 C)  TempSrc:  Oral  Oral  SpO2: 98% 98% 96% 95%  Weight:  67.4 kg    Height:       Intake/Output from previous day:   Intake/Output Summary (Last 24 hours) at 08/15/2018 1356 Last data filed at 08/15/2018 1300 Gross per 24 hour  Intake 720 ml  Output 900 ml  Net -180 ml   Physical Exam: General: Alert, awake, oriented x3, in no acute distress.  HEENT: [place]/AT PEERL, EOMI Neck: Trachea midline,  no masses, no thyromegal,y no JVD, no carotid bruit OROPHARYNX:  Moist, No exudate/ erythema/lesions.  Heart: Regular rate and rhythm, without murmurs, rubs, gallops, PMI non-displaced, no heaves or thrills on palpation.  Lungs: Clear to auscultation, no wheezing or rhonchi noted. No increased vocal fremitus resonant to percussion  Abdomen: Soft, nontender, nondistended, positive bowel sounds, no masses no hepatosplenomegaly noted..  Neuro: No focal neurological deficits noted cranial nerves II through XII grossly intact. DTRs 2+ bilaterally upper and lower extremities. Strength 5  out of 5 in bilateral upper and lower extremities. Musculoskeletal: No warm swelling or erythema around joints, no spinal tenderness noted. Psychiatric: Patient alert and oriented x3, good insight and cognition, good recent to remote recall. Lymph node survey: No cervical axillary or inguinal lymphadenopathy noted.  Lab Results:  Basic Metabolic Panel:    Component Value Date/Time   NA 135 08/15/2018 0401   NA 141 01/20/2018 1508   K 3.9 08/15/2018 0401   CL 106 08/15/2018 0401   CO2 22 08/15/2018 0401   BUN 10 08/15/2018 0401   BUN 7 01/20/2018 1508   CREATININE 0.81 08/15/2018 0401   GLUCOSE 98 08/15/2018 0401   CALCIUM 8.6 (L) 08/15/2018 0401   CBC:    Component Value Date/Time   WBC 11.0 (H) 08/15/2018 0401   HGB 9.9 (L) 08/15/2018 0401   HGB 12.5 (L) 01/20/2018 1508   HCT 28.9 (L) 08/15/2018 0401   HCT 38.8 01/20/2018 1508   PLT 228 08/15/2018 0401   PLT 171 01/20/2018 1508   MCV 79.4 (L) 08/15/2018 0401   MCV 86 01/20/2018 1508   NEUTROABS 20.9 (H) 08/13/2018 2149   NEUTROABS 2.9 01/20/2018 1508   LYMPHSABS 1.7 08/13/2018 2149   LYMPHSABS 2.7 01/20/2018 1508   MONOABS 2.0 (H) 08/13/2018 2149   EOSABS 0.0 08/13/2018 2149   EOSABS 0.0 01/20/2018 1508   BASOSABS 0.0 08/13/2018 2149   BASOSABS 0.0 01/20/2018 1508    No results  found for this or any previous visit (from the past 240 hour(s)).  Studies/Results: No results found.  Medications: Scheduled Meds: . enoxaparin (LOVENOX) injection  40 mg Subcutaneous Q24H  . folic acid  1 mg Oral Daily  . HYDROmorphone   Intravenous Q4H  . ketorolac  15 mg Intravenous Q6H  . senna-docusate  1 tablet Oral BID   Continuous Infusions: . sodium chloride     PRN Meds:.acetaminophen, diphenhydrAMINE **OR** diphenhydrAMINE, HYDROcodone-acetaminophen, naloxone **AND** sodium chloride flush, ondansetron (ZOFRAN) IV, polyethylene glycol  Assessment/Plan: Principal Problem:   Sickle cell pain crisis (HCC) Active  Problems:   Leukocytosis   Chronic pain syndrome   Anemia of chronic disease  1. Fever: White cell count is down to 11, acetaminophen 650 mg tablet p.o. every 4 to 6-hour as needed fever.  Blood culture x2, urinalysis with reflex culture, chest x-ray, respiratory panel.  Will reevaluate with results of laboratory tests. 2. Hb Sickle Cell Disease with crisis: Continue IVF D5 .45% Saline @ 125 mls/hour, adjust weight based Dilaudid PCA, IV Toradol 15 mg Q 6 H, Monitor vitals very closely, Re-evaluate pain scale regularly, 2 L of Oxygen by Trail. 3. Leukocytosis: White cell count is at 11, will continue to monitor.  May need antibiotics if chest x-ray showed any infiltrate. 4. Sickle Cell Anemia: Hemoglobin is currently at baseline, no medical indication for blood transfusion.  We will continue to monitor. 5. Chronic pain Syndrome: Continue home pain medications.  Code Status: Full Code Family Communication: N/A Disposition Plan: Not yet ready for discharge  William Jennings  If 7PM-7AM, please contact night-coverage.  08/15/2018, 1:56 PM  LOS: 1 day

## 2018-08-15 NOTE — Progress Notes (Addendum)
Pt febrile ca. 1300 at 102; pulse 118, RR 21. Dr Hyman Hopes notified; new order received for APAP for fever. WBC trending down, but to notify DR of any further changes or concerns. Admin APAP and 2L Oxygen for comfort/support.

## 2018-08-15 NOTE — Progress Notes (Signed)
   08/15/18 1258  MEWS Score  Resp (!) 21  Pulse Rate (!) 118  BP 139/66  Temp (!) 102 F (38.9 C)  SpO2 95 %  O2 Device Nasal Cannula  MEWS Score  MEWS RR 1  MEWS Pulse 2  MEWS Systolic 0  MEWS LOC 0  MEWS Temp 2  MEWS Score 5  MEWS Score Color Red  MEWS Assessment  Is this an acute change? Yes  Provider Notification  Provider Name/Title Dr Hyman Hopes  Date Provider Notified 08/15/18  Time Provider Notified 1320  Notification Type Call  Notification Reason Change in status  Response See new orders  Date of Provider Response 08/15/18  Time of Provider Response 1321  see progress note dated 1326, 08/15/18

## 2018-08-16 DIAGNOSIS — J181 Lobar pneumonia, unspecified organism: Secondary | ICD-10-CM

## 2018-08-16 DIAGNOSIS — R05 Cough: Secondary | ICD-10-CM

## 2018-08-16 LAB — CBC WITH DIFFERENTIAL/PLATELET
Abs Immature Granulocytes: 0 10*3/uL (ref 0.00–0.07)
Basophils Absolute: 0 10*3/uL (ref 0.0–0.1)
Basophils Relative: 0 %
Eosinophils Absolute: 0 10*3/uL (ref 0.0–0.5)
Eosinophils Relative: 0 %
HCT: 26.9 % — ABNORMAL LOW (ref 39.0–52.0)
Hemoglobin: 9.4 g/dL — ABNORMAL LOW (ref 13.0–17.0)
Lymphocytes Relative: 23 %
Lymphs Abs: 2.2 10*3/uL (ref 0.7–4.0)
MCH: 27.4 pg (ref 26.0–34.0)
MCHC: 34.9 g/dL (ref 30.0–36.0)
MCV: 78.4 fL — ABNORMAL LOW (ref 80.0–100.0)
Monocytes Absolute: 1 10*3/uL (ref 0.1–1.0)
Monocytes Relative: 11 %
NEUTROS ABS: 6.3 10*3/uL (ref 1.7–7.7)
NEUTROS PCT: 66 %
NRBC: 2 /100{WBCs} — AB
PLATELETS: 172 10*3/uL (ref 150–400)
RBC: 3.43 MIL/uL — ABNORMAL LOW (ref 4.22–5.81)
RDW: 15.4 % (ref 11.5–15.5)
WBC: 9.5 10*3/uL (ref 4.0–10.5)
nRBC: 3 % — ABNORMAL HIGH (ref 0.0–0.2)

## 2018-08-16 LAB — COMPREHENSIVE METABOLIC PANEL
ALT: 22 U/L (ref 0–44)
AST: 47 U/L — ABNORMAL HIGH (ref 15–41)
Albumin: 3.5 g/dL (ref 3.5–5.0)
Alkaline Phosphatase: 203 U/L — ABNORMAL HIGH (ref 38–126)
Anion gap: 6 (ref 5–15)
BUN: 9 mg/dL (ref 6–20)
CO2: 25 mmol/L (ref 22–32)
Calcium: 8.4 mg/dL — ABNORMAL LOW (ref 8.9–10.3)
Chloride: 105 mmol/L (ref 98–111)
Creatinine, Ser: 0.67 mg/dL (ref 0.61–1.24)
GFR calc Af Amer: >60 mL/min (ref >60)
GFR calc non Af Amer: >60 mL/min (ref >60)
Glucose, Bld: 119 mg/dL — ABNORMAL HIGH (ref 70–99)
Potassium: 3.4 mmol/L — ABNORMAL LOW (ref 3.5–5.1)
Sodium: 136 mmol/L (ref 135–145)
Total Bilirubin: 1.7 mg/dL — ABNORMAL HIGH (ref 0.3–1.2)
Total Protein: 6.7 g/dL (ref 6.5–8.1)

## 2018-08-16 LAB — RETICULOCYTES
Immature Retic Fract: 32.3 % — ABNORMAL HIGH (ref 2.3–15.9)
RBC.: 3.43 MIL/uL — ABNORMAL LOW (ref 4.22–5.81)
Retic Count, Absolute: 169.8 10*3/uL (ref 19.0–186.0)
Retic Ct Pct: 5 % — ABNORMAL HIGH (ref 0.4–3.1)

## 2018-08-16 LAB — LACTATE DEHYDROGENASE: LDH: 960 U/L — ABNORMAL HIGH (ref 98–192)

## 2018-08-16 NOTE — Progress Notes (Signed)
Patient ID: William Jennings, male   DOB: 04/05/1991, 28 y.o.   MRN: [id number] Subjective: William Jennings, a 28 year old male with a medical history significant for sickle cell anemia, type hemoglobin [place], and chronic pain syndrome was admitted and sickle cell pain crisis.  Patient feels slightly better today. Fever has resolved.  Last temperature was 98.9 F. Patient is eating and drinking well. He is still complaining of significant pain of between 7 and 8/10. He denies any chest pain but still coughing. He denies shortness of breath..  Objective:  Vital signs in last 24 hours:  Vitals:   08/16/18 0758 08/16/18 0942 08/16/18 1155 08/16/18 1330  BP:  113/60  129/77  Pulse:  100  98  Resp: Temp:  99.2 F (37.3 C)  98.9 F (37.2 C)  TempSrc:  Oral  Oral  SpO2: 98% 97% (!) 87% 100%  Weight:      Height:       Intake/Output from previous day:   Intake/Output Summary (Last 24 hours) at 08/16/2018 1411 Last data filed at 08/16/2018 1300 Gross per 24 hour  Intake 1470 ml  Output 1800 ml  Net -330 ml   Physical Exam: General: Alert, awake, oriented x3, in no acute distress.  HEENT: [place]/AT [place], EOMI Neck: Trachea midline,  no masses, no thyromegal,y no JVD, no carotid bruit OROPHARYNX:  Moist, No exudate/ erythema/lesions.  Heart: Regular rate and rhythm, without murmurs, rubs, gallops, PMI non-displaced, no heaves or thrills on palpation.  Lungs: Clear to auscultation, no wheezing or rhonchi noted. No increased vocal fremitus resonant to percussion  Abdomen: Soft, nontender, nondistended, positive bowel sounds, no masses no hepatosplenomegaly noted..  Neuro: No focal neurological deficits noted cranial nerves II through XII grossly intact. DTRs 2+ bilaterally upper and lower extremities. Strength 5 out of 5 in bilateral upper and lower extremities. Musculoskeletal: No warm swelling or erythema around joints, no spinal tenderness noted. Psychiatric: Patient alert and oriented  x3, good insight and cognition, good recent to remote recall. Lymph node survey: No cervical axillary or inguinal lymphadenopathy noted.  Lab Results:  Basic Metabolic Panel:    Component Value Date/Time   NA 136 08/16/2018 0332   NA 141 01/20/2018 1508   K 3.4 (L) 08/16/2018 0332   CL 105 08/16/2018 0332   CO2 25 08/16/2018 0332   BUN 9 08/16/2018 0332   BUN 7 01/20/2018 1508   CREATININE 0.67 08/16/2018 0332   GLUCOSE 119 (H) 08/16/2018 0332   CALCIUM 8.4 (L) 08/16/2018 0332   CBC:    Component Value Date/Time   WBC 9.5 08/16/2018 0332   HGB 9.4 (L) 08/16/2018 0332   HGB 12.5 (L) 01/20/2018 1508   HCT 26.9 (L) 08/16/2018 0332   HCT 38.8 01/20/2018 1508   PLT 172 08/16/2018 0332   PLT 171 01/20/2018 1508   MCV 78.4 (L) 08/16/2018 0332   MCV 86 01/20/2018 1508   NEUTROABS 6.3 08/16/2018 0332   NEUTROABS 2.9 01/20/2018 1508   LYMPHSABS 2.2 08/16/2018 0332   LYMPHSABS 2.7 01/20/2018 1508   MONOABS 1.0 08/16/2018 0332   EOSABS 0.0 08/16/2018 0332   EOSABS 0.0 01/20/2018 1508   BASOSABS 0.0 08/16/2018 0332   BASOSABS 0.0 01/20/2018 1508    Recent Results (from the past 240 hour(s))  Culture, blood (routine x 2)     Status: None (Preliminary result)   Collection Time: 08/15/18  2:12 PM  Result Value Ref Range Status   Specimen Description  Final    BLOOD LEFT ARM Performed at [facility], [address] [place], [place] [address]    Special Requests   Final    BOTTLES DRAWN AEROBIC AND ANAEROBIC Blood Culture adequate volume Performed at [facility], [address] [place], [place] [address]    Culture   Final    NO GROWTH < 24 HOURS Performed at [facility], [address] [place], [place] [address]    Report Status PENDING  Incomplete  Culture, blood (routine x 2)     Status: None (Preliminary result)   Collection Time: 08/15/18  2:20 PM  Result Value Ref Range Status   Specimen Description   Final    BLOOD  LEFT ARM Performed at [facility], [address], [place], [place] [address]    Special Requests   Final    BOTTLES DRAWN AEROBIC AND ANAEROBIC Blood Culture adequate volume Performed at [facility], [address], [place], [place] [address]    Culture   Final    NO GROWTH < 24 HOURS Performed at [facility], [address] [place], [place] [address]    Report Status PENDING  Incomplete  Respiratory Panel by PCR     Status: None   Collection Time: 08/15/18  2:39 PM  Result Value Ref Range Status   Adenovirus NOT DETECTED NOT DETECTED Final   Coronavirus 229E NOT DETECTED NOT DETECTED Final    Comment: (NOTE) The Coronavirus on the Respiratory Panel, DOES NOT test for the novel  Coronavirus (2019 nCoV)    Coronavirus HKU1 NOT DETECTED NOT DETECTED Final   Coronavirus NL63 NOT DETECTED NOT DETECTED Final   Coronavirus OC43 NOT DETECTED NOT DETECTED Final   Metapneumovirus NOT DETECTED NOT DETECTED Final   Rhinovirus / Enterovirus NOT DETECTED NOT DETECTED Final   Influenza A NOT DETECTED NOT DETECTED Final   Influenza B NOT DETECTED NOT DETECTED Final   Parainfluenza Virus 1 NOT DETECTED NOT DETECTED Final   Parainfluenza Virus 2 NOT DETECTED NOT DETECTED Final   Parainfluenza Virus 3 NOT DETECTED NOT DETECTED Final   Parainfluenza Virus 4 NOT DETECTED NOT DETECTED Final   Respiratory Syncytial Virus NOT DETECTED NOT DETECTED Final   Bordetella pertussis NOT DETECTED NOT DETECTED Final   Chlamydophila pneumoniae NOT DETECTED NOT DETECTED Final   Mycoplasma pneumoniae NOT DETECTED NOT DETECTED Final    Comment: Performed at [facility], [address], [place], [place] [address]    Studies/Results: Dg Chest 2 View  Result Date: 08/15/2018 CLINICAL DATA:  History of sickle cell and fever EXAM: CHEST - 2 VIEW COMPARISON:  06/17/2018 FINDINGS: Cardiac shadow is within normal limits. The lungs are well aerated bilaterally.  Minimal atelectasis/early infiltrate is noted in the right lung base. No sizable effusion is seen. No acute bony abnormality is noted. Stable changes of sickle cell in the thoracic spine are noted. IMPRESSION: Minimal right basilar infiltrate Electronically Signed   By: Alcide Clever M.D.   On: 08/15/2018 16:56    Medications: Scheduled Meds: . azithromycin  500 mg Oral Daily  . enoxaparin (LOVENOX) injection  40 mg Subcutaneous Q24H  . folic acid  1 mg Oral Daily  . HYDROmorphone   Intravenous Q4H  . ketorolac  15 mg Intravenous Q6H  . senna-docusate  1 tablet Oral BID   Continuous Infusions: . sodium chloride 125 mL/hr at 08/16/18 0953  . cefTRIAXone (ROCEPHIN)  IV 2 g (08/15/18 2019)   PRN Meds:.acetaminophen, diphenhydrAMINE **OR** diphenhydrAMINE, HYDROcodone-acetaminophen, naloxone **AND** sodium chloride flush, ondansetron (ZOFRAN) IV, polyethylene glycol  Assessment/Plan: Principal Problem:   Sickle cell pain crisis (HCC) Active Problems:   Fever   Leukocytosis   Chronic pain syndrome   Anemia of chronic disease   Cough  1. Fever: Resolved. White cell count is down to 9.5. Blood culture x2 negative so far, urinalysis with reflex culture negative, chest x-ray - Showed minimal right basilar infiltrate, respiratory panel is negative for any viral infection. Patient started on antibiotics yesterday, will continue.. 2. Hb Sickle Cell Disease with crisis: Reduce IVF D5 .45% Saline to 75 mls/hour, continue weight based Dilaudid PCA, IV Toradol 15 mg Q 6 H, Monitor vitals very closely, Re-evaluate pain scale regularly, 2 L of Oxygen by [place]. 3. Leukocytosis: White cell count is at 9.5, will continue to monitor.  Continue antibiotics 4. Sickle Cell Anemia: Hemoglobin is currently at baseline, no medical indication for blood transfusion.  We will continue to monitor. 5. Chronic pain Syndrome: Continue home pain medications.  Code Status: Full Code Family Communication: N/A Disposition  Plan: Not yet ready for discharge  Verley Pariseau  If 7PM-7AM, please contact night-coverage.  08/16/2018, 2:11 PM  LOS: 2 days

## 2018-08-17 NOTE — Progress Notes (Signed)
Patient's MEWS score 2, v/s temp 99.7 , BP 94/58 , HR 102, O2 98% RA . Dr Hyman Hopes notified and MEWS protocol followed.

## 2018-08-17 NOTE — Progress Notes (Signed)
Patient ID: William Jennings, male   DOB: 1991/04/10, 28 y.o.   MRN: [id number] Subjective: William Jennings, a 28 year old male with a medical history significant for sickle cell anemia, type hemoglobin [place], and chronic pain syndrome was admitted and sickle cell pain crisis.  Patient continues to feel better today. Fever has resolved. Patient is eating and drinking well.  His pain is down to 6/10 today. He denies any chest pain but still coughing. He denies shortness of breath..  Objective:  Vital signs in last 24 hours:  Vitals:   08/17/18 1208 08/17/18 1337 08/17/18 1506 08/17/18 1554  BP: 124/72  (!) 94/58   Pulse: 95  (!) 102   Resp: 16 16 16 18   Temp: 99.5 F (37.5 C)  99.7 F (37.6 C)   TempSrc: Oral  Oral   SpO2: 100% 96% 98% 98%  Weight:      Height:       Intake/Output from previous day:   Intake/Output Summary (Last 24 hours) at 08/17/2018 1648 Last data filed at 08/17/2018 1554 Gross per 24 hour  Intake 2289.31 ml  Output 2325 ml  Net -35.69 ml   Physical Exam: General: Alert, awake, oriented x3, in no acute distress.  HEENT: [place]/AT [place], EOMI Neck: Trachea midline,  no masses, no thyromegal,y no JVD, no carotid bruit OROPHARYNX:  Moist, No exudate/ erythema/lesions.  Heart: Regular rate and rhythm, without murmurs, rubs, gallops, PMI non-displaced, no heaves or thrills on palpation.  Lungs: Clear to auscultation, no wheezing or rhonchi noted. No increased vocal fremitus resonant to percussion  Abdomen: Soft, nontender, nondistended, positive bowel sounds, no masses no hepatosplenomegaly noted..  Neuro: No focal neurological deficits noted cranial nerves II through XII grossly intact. DTRs 2+ bilaterally upper and lower extremities. Strength 5 out of 5 in bilateral upper and lower extremities. Musculoskeletal: No warm swelling or erythema around joints, no spinal tenderness noted. Psychiatric: Patient alert and oriented x3, good insight and cognition, good recent to remote  recall. Lymph node survey: No cervical axillary or inguinal lymphadenopathy noted.  Lab Results:  Basic Metabolic Panel:    Component Value Date/Time   NA 136 08/16/2018 0332   NA 141 01/20/2018 1508   K 3.4 (L) 08/16/2018 0332   CL 105 08/16/2018 0332   CO2 25 08/16/2018 0332   BUN 9 08/16/2018 0332   BUN 7 01/20/2018 1508   CREATININE 0.67 08/16/2018 0332   GLUCOSE 119 (H) 08/16/2018 0332   CALCIUM 8.4 (L) 08/16/2018 0332   CBC:    Component Value Date/Time   WBC 9.5 08/16/2018 0332   HGB 9.4 (L) 08/16/2018 0332   HGB 12.5 (L) 01/20/2018 1508   HCT 26.9 (L) 08/16/2018 0332   HCT 38.8 01/20/2018 1508   PLT 172 08/16/2018 0332   PLT 171 01/20/2018 1508   MCV 78.4 (L) 08/16/2018 0332   MCV 86 01/20/2018 1508   NEUTROABS 6.3 08/16/2018 0332   NEUTROABS 2.9 01/20/2018 1508   LYMPHSABS 2.2 08/16/2018 0332   LYMPHSABS 2.7 01/20/2018 1508   MONOABS 1.0 08/16/2018 0332   EOSABS 0.0 08/16/2018 0332   EOSABS 0.0 01/20/2018 1508   BASOSABS 0.0 08/16/2018 0332   BASOSABS 0.0 01/20/2018 1508    Recent Results (from the past 240 hour(s))  Culture, blood (routine x 2)     Status: None (Preliminary result)   Collection Time: 08/15/18  2:12 PM  Result Value Ref Range Status   Specimen Description   Final    BLOOD LEFT ARM  Performed at [facility], [address] [place], [place] [address]    Special Requests   Final    BOTTLES DRAWN AEROBIC AND ANAEROBIC Blood Culture adequate volume Performed at [facility], [address], [place], [place] [address]    Culture   Final    NO GROWTH 2 DAYS Performed at [facility], [address] [place], [place] [address]    Report Status PENDING  Incomplete  Culture, blood (routine x 2)     Status: None (Preliminary result)   Collection Time: 08/15/18  2:20 PM  Result Value Ref Range Status   Specimen Description   Final    BLOOD LEFT ARM Performed at [facility],  [address], [place], [place] [address]    Special Requests   Final    BOTTLES DRAWN AEROBIC AND ANAEROBIC Blood Culture adequate volume Performed at [facility], [address] [place], [place] [address]    Culture   Final    NO GROWTH 2 DAYS Performed at [facility], [address], [place], [place] [address]    Report Status PENDING  Incomplete  Respiratory Panel by PCR     Status: None   Collection Time: 08/15/18  2:39 PM  Result Value Ref Range Status   Adenovirus NOT DETECTED NOT DETECTED Final   Coronavirus 229E NOT DETECTED NOT DETECTED Final    Comment: (NOTE) The Coronavirus on the Respiratory Panel, DOES NOT test for the novel  Coronavirus (2019 nCoV)    Coronavirus HKU1 NOT DETECTED NOT DETECTED Final   Coronavirus NL63 NOT DETECTED NOT DETECTED Final   Coronavirus OC43 NOT DETECTED NOT DETECTED Final   Metapneumovirus NOT DETECTED NOT DETECTED Final   Rhinovirus / Enterovirus NOT DETECTED NOT DETECTED Final   Influenza A NOT DETECTED NOT DETECTED Final   Influenza B NOT DETECTED NOT DETECTED Final   Parainfluenza Virus 1 NOT DETECTED NOT DETECTED Final   Parainfluenza Virus 2 NOT DETECTED NOT DETECTED Final   Parainfluenza Virus 3 NOT DETECTED NOT DETECTED Final   Parainfluenza Virus 4 NOT DETECTED NOT DETECTED Final   Respiratory Syncytial Virus NOT DETECTED NOT DETECTED Final   Bordetella pertussis NOT DETECTED NOT DETECTED Final   Chlamydophila pneumoniae NOT DETECTED NOT DETECTED Final   Mycoplasma pneumoniae NOT DETECTED NOT DETECTED Final    Comment: Performed at [facility], [address] [place], [place] [address]    Studies/Results: No results found.  Medications: Scheduled Meds: . azithromycin  500 mg Oral Daily  . enoxaparin (LOVENOX) injection  40 mg Subcutaneous Q24H  . folic acid  1 mg Oral Daily  . HYDROmorphone   Intravenous Q4H  . ketorolac  15 mg Intravenous Q6H  . senna-docusate  1 tablet Oral BID    Continuous Infusions: . cefTRIAXone (ROCEPHIN)  IV 2 g (08/16/18 1840)   PRN Meds:.acetaminophen, diphenhydrAMINE **OR** diphenhydrAMINE, HYDROcodone-acetaminophen, naloxone **AND** sodium chloride flush, ondansetron (ZOFRAN) IV, polyethylene glycol  Assessment/Plan: Principal Problem:   Sickle cell pain crisis (HCC) Active Problems:   Fever   Leukocytosis   Chronic pain syndrome   Anemia of chronic disease   Cough  1. Fever: Resolved. Will complete course of azithromycin.  Possible discharge home tomorrow. 2. Hb Sickle Cell Disease with crisis: Reduce IVF D5 .45% Saline to [facility], continue weight based Dilaudid PCA, IV Toradol 15 mg Q 6 H, Monitor vitals very closely, Re-evaluate pain scale regularly, 2 L  of Oxygen by [place]. 3. Leukocytosis: will continue to monitor. Continue antibiotics 4. Sickle Cell Anemia: Hemoglobin is currently at baseline, no medical indication for blood transfusion.  We will continue to monitor. 5. Chronic pain Syndrome: Continue home pain medications.  Code Status: Full Code Family Communication: N/A Disposition Plan: Not yet ready for discharge  Daylene Vandenbosch  If 7PM-7AM, please contact night-coverage.  08/17/2018, 4:48 PM  LOS: 3 days

## 2018-08-18 LAB — CBC WITH DIFFERENTIAL/PLATELET
Abs Immature Granulocytes: 0.07 10*3/uL (ref 0.00–0.07)
BASOS PCT: 0 %
Basophils Absolute: 0 10*3/uL (ref 0.0–0.1)
Eosinophils Absolute: 0.3 10*3/uL (ref 0.0–0.5)
Eosinophils Relative: 4 %
HCT: 23 % — ABNORMAL LOW (ref 39.0–52.0)
Hemoglobin: 7.9 g/dL — ABNORMAL LOW (ref 13.0–17.0)
Immature Granulocytes: 1 %
Lymphocytes Relative: 32 %
Lymphs Abs: 2.9 10*3/uL (ref 0.7–4.0)
MCH: 27.1 pg (ref 26.0–34.0)
MCHC: 34.3 g/dL (ref 30.0–36.0)
MCV: 78.8 fL — AB (ref 80.0–100.0)
Monocytes Absolute: 1.5 10*3/uL — ABNORMAL HIGH (ref 0.1–1.0)
Monocytes Relative: 17 %
Neutro Abs: 4.3 10*3/uL (ref 1.7–7.7)
Neutrophils Relative %: 46 %
Platelets: 157 10*3/uL (ref 150–400)
RBC: 2.92 MIL/uL — AB (ref 4.22–5.81)
RDW: 15.4 % (ref 11.5–15.5)
WBC: 9.2 10*3/uL (ref 4.0–10.5)
nRBC: 3 % — ABNORMAL HIGH (ref 0.0–0.2)

## 2018-08-18 LAB — BASIC METABOLIC PANEL
Anion gap: 7 (ref 5–15)
BUN: 8 mg/dL (ref 6–20)
CO2: 27 mmol/L (ref 22–32)
CREATININE: 0.83 mg/dL (ref 0.61–1.24)
Calcium: 8.5 mg/dL — ABNORMAL LOW (ref 8.9–10.3)
Chloride: 106 mmol/L (ref 98–111)
GFR calc Af Amer: >60 mL/min (ref >60)
GFR calc non Af Amer: >60 mL/min (ref >60)
Glucose, Bld: 129 mg/dL — ABNORMAL HIGH (ref 70–99)
Potassium: 3.6 mmol/L (ref 3.5–5.1)
Sodium: 140 mmol/L (ref 135–145)

## 2018-08-18 MED ORDER — SALINE SPRAY 0.65 % NA SOLN
1.0000 | NASAL | Status: DC | PRN
  Administered 2018-08-18: 1 via NASAL
  Filled 2018-08-18: qty 44

## 2018-08-18 NOTE — Progress Notes (Signed)
Patient ID: William Jennings, male   DOB: 27-Jun-1990, 28 y.o.   MRN: [id number] Subjective: [name], a 28 year old male with a medical history significant for sickle cell anemia, type hemoglobin [place], and chronic pain syndrome was admitted and sickle cell pain crisis.  Patient claims his pain went back to 10/10 last night because he lost his IV site and it took a while before the IV was reestablished.  He does not feel good today so will not be able to go home.  Fever has resolved. Patient is eating and drinking well.  His pain is now 7/10 today. He denies any chest pain but still coughing. He denies shortness of breath..  Objective:  Vital signs in last 24 hours:  Vitals:   08/18/18 0611 08/18/18 0829 08/18/18 0947 08/18/18 1410  BP: 106/63  122/68   Pulse: 78  95   Resp: 14 16 19  (!) 22  Temp: 97.7 F (36.5 C)  98.6 F (37 C)   TempSrc: Oral  Oral   SpO2: 97% 98% 96% 96%  Weight: 72.1 kg     Height:       Intake/Output from previous day:   Intake/Output Summary (Last 24 hours) at 08/18/2018 1648 Last data filed at 08/18/2018 1100 Gross per 24 hour  Intake 100 ml  Output 1350 ml  Net -1250 ml   Physical Exam: General: Alert, awake, oriented x3, in no acute distress.  HEENT: [place]/AT [place]ER[place], EOMI Neck: Trachea midline,  no masses, no thyromegal,y no JVD, no carotid bruit OROPHARYNX:  Moist, No exudate/ erythema/lesions.  Heart: Regular rate and rhythm, without murmurs, rubs, gallops, PMI non-displaced, no heaves or thrills on palpation.  Lungs: Clear to auscultation, no wheezing or rhonchi noted. No increased vocal fremitus resonant to percussion  Abdomen: Soft, nontender, nondistended, positive bowel sounds, no masses no hepatosplenomegaly noted..  Neuro: No focal neurological deficits noted cranial nerves II through XII grossly intact. DTRs 2+ bilaterally upper and lower extremities. Strength 5 out of 5 in bilateral upper and lower extremities. Musculoskeletal: No warm swelling  or erythema around joints, no spinal tenderness noted. Psychiatric: Patient alert and oriented x3, good insight and cognition, good recent to remote recall. Lymph node survey: No cervical axillary or inguinal lymphadenopathy noted.  Lab Results:  Basic Metabolic Panel:    Component Value Date/Time   NA 140 08/18/2018 0308   NA 141 01/20/2018 1508   K 3.6 08/18/2018 0308   CL 106 08/18/2018 0308   CO2 27 08/18/2018 0308   BUN 8 08/18/2018 0308   BUN 7 01/20/2018 1508   CREATININE 0.83 08/18/2018 0308   GLUCOSE 129 (H) 08/18/2018 0308   CALCIUM 8.5 (L) 08/18/2018 0308   CBC:    Component Value Date/Time   WBC 9.2 08/18/2018 0308   HGB 7.9 (L) 08/18/2018 0308   HGB 12.5 (L) 01/20/2018 1508   HCT 23.0 (L) 08/18/2018 0308   HCT 38.8 01/20/2018 1508   PLT 157 08/18/2018 0308   PLT 171 01/20/2018 1508   MCV 78.8 (L) 08/18/2018 0308   MCV 86 01/20/2018 1508   NEUTROABS 4.3 08/18/2018 0308   NEUTROABS 2.9 01/20/2018 1508   LYMPHSABS 2.9 08/18/2018 0308   LYMPHSABS 2.7 01/20/2018 1508   MONOABS 1.5 (H) 08/18/2018 0308   EOSABS 0.3 08/18/2018 0308   EOSABS 0.0 01/20/2018 1508   BASOSABS 0.0 08/18/2018 0308   BASOSABS 0.0 01/20/2018 1508    Recent Results (from the past 240 hour(s))  Culture, blood (routine x 2)  Status: None (Preliminary result)   Collection Time: 08/15/18  2:12 PM  Result Value Ref Range Status   Specimen Description   Final    BLOOD LEFT ARM Performed at [facility], [address], [place], [place] [address]    Special Requests   Final    BOTTLES DRAWN AEROBIC AND ANAEROBIC Blood Culture adequate volume Performed at [facility], [address] [place], [place] [address]    Culture   Final    NO GROWTH 3 DAYS Performed at [facility], [address], [place], [place] [address]    Report Status PENDING  Incomplete  Culture, blood (routine x 2)     Status: None (Preliminary result)   Collection Time:  08/15/18  2:20 PM  Result Value Ref Range Status   Specimen Description   Final    BLOOD LEFT ARM Performed at [facility], [address] [place], [place] [address]    Special Requests   Final    BOTTLES DRAWN AEROBIC AND ANAEROBIC Blood Culture adequate volume Performed at [facility] [address], [place], [place] [address]    Culture   Final    NO GROWTH 3 DAYS Performed at [facility], [address], [place], [place] [address]    Report Status PENDING  Incomplete  Respiratory Panel by PCR     Status: None   Collection Time: 08/15/18  2:39 PM  Result Value Ref Range Status   Adenovirus NOT DETECTED NOT DETECTED Final   Coronavirus 229E NOT DETECTED NOT DETECTED Final    Comment: (NOTE) The Coronavirus on the Respiratory Panel, DOES NOT test for the novel  Coronavirus (2019 nCoV)    Coronavirus HKU1 NOT DETECTED NOT DETECTED Final   Coronavirus NL63 NOT DETECTED NOT DETECTED Final   Coronavirus OC43 NOT DETECTED NOT DETECTED Final   Metapneumovirus NOT DETECTED NOT DETECTED Final   Rhinovirus / Enterovirus NOT DETECTED NOT DETECTED Final   Influenza A NOT DETECTED NOT DETECTED Final   Influenza B NOT DETECTED NOT DETECTED Final   Parainfluenza Virus 1 NOT DETECTED NOT DETECTED Final   Parainfluenza Virus 2 NOT DETECTED NOT DETECTED Final   Parainfluenza Virus 3 NOT DETECTED NOT DETECTED Final   Parainfluenza Virus 4 NOT DETECTED NOT DETECTED Final   Respiratory Syncytial Virus NOT DETECTED NOT DETECTED Final   Bordetella pertussis NOT DETECTED NOT DETECTED Final   Chlamydophila pneumoniae NOT DETECTED NOT DETECTED Final   Mycoplasma pneumoniae NOT DETECTED NOT DETECTED Final    Comment: Performed at [facility], [address] [place], [place] [address]    Studies/Results: No results found.  Medications: Scheduled Meds: . azithromycin  500 mg Oral Daily  . enoxaparin (LOVENOX) injection  40 mg Subcutaneous  Q24H  . folic acid  1 mg Oral Daily  . HYDROmorphone   Intravenous Q4H  . ketorolac  15 mg Intravenous Q6H  . senna-docusate  1 tablet Oral BID   Continuous Infusions: . cefTRIAXone (ROCEPHIN)  IV Stopped (08/17/18 2001)   PRN Meds:.acetaminophen, diphenhydrAMINE **OR** diphenhydrAMINE, HYDROcodone-acetaminophen, naloxone **AND** sodium chloride flush, ondansetron (ZOFRAN) IV, polyethylene glycol, sodium chloride  Assessment/Plan: Principal Problem:   Sickle cell pain crisis (HCC) Active Problems:   Fever   Leukocytosis   Chronic pain syndrome   Anemia of chronic disease   Cough  1. Fever: Resolved. Will complete course of azithromycin.  Ceftriaxone discontinued.  Possible discharge home tomorrow. 2.  Hb Sickle Cell Disease with crisis: Reduce IVF D5 .[facility]% [facility], continue weight based Dilaudid PCA, IV Toradol 15 mg Q 6 H, Monitor vitals very closely, Re-evaluate pain scale regularly, 2 L of Oxygen by [place]. 3. Leukocytosis: will continue to monitor. Continue antibiotics 4. Sickle Cell Anemia: Hemoglobin is currently at baseline, no medical indication for blood transfusion.  We will continue to monitor. 5. Chronic pain Syndrome: Continue home pain medications.  Code Status: Full Code Family Communication: N/A Disposition Plan: Not yet ready for discharge  Ithiel Liebler  If 7PM-7AM, please contact night-coverage.  08/18/2018, 4:48 PM  LOS: 4 days

## 2018-08-19 MED ORDER — AZITHROMYCIN 250 MG PO TABS: ORAL_TABLET | ORAL | 0 refills | Status: AC

## 2018-08-19 MED ORDER — LIP MEDEX EX OINT
1.0000 "application " | TOPICAL_OINTMENT | CUTANEOUS | Status: DC | PRN
  Filled 2018-08-19: qty 7

## 2018-08-19 NOTE — Discharge Summary (Signed)
Physician Discharge Summary  William Jennings WUJ:[id number] DOB: [birth year] DOA: 08/13/2018  PCP: Mike Gip, FNP  Admit date: 08/13/2018  Discharge date: 08/19/2018  Discharge Diagnoses:  Principal Problem:   Sickle cell pain crisis (HCC) Active Problems:   Fever   Leukocytosis   Chronic pain syndrome   Anemia of chronic disease   Cough  Discharge Condition: Stable  Disposition:  Follow-up Information    Mike Gip, FNP. Schedule an appointment as soon as possible for a visit in 2 day(s).   Specialty:  Family Medicine Contact information: [address] [name] [address] [phone]          Pt is discharged home in good condition and is to follow up with Mike Gip, FNP this week to have labs evaluated. William Jennings is instructed to increase activity slowly and balance with rest for the next few days, and use prescribed medication to complete treatment of pain  Diet: Regular Wt Readings from Last 3 Encounters:  08/19/18 66.4 kg  08/13/18 68 kg  08/12/18 68 kg   History of present illness:  William Jennings is a 28 y.o. male with history of sickle cell anemia presents to the ER because of increasing upper and lower back pain over the last 72 hours.  Denies any fever chills chest pain productive cough headache visual symptoms or focal deficits.  Has been trying ibuprofen and hydrocodone at home which did not relieve the pain.  Had upper respiratory infection last month.  ED Course: In the ER patient was given multiple doses of pain of the medication despite which patient was still in pain and admitted for further pain management secondary to sickle cell anemia pain crisis.  Lab work show significant leukocytosis but patient was afebrile.  Hospital Course:  Patient was admitted for sickle cell pain crisis and managed appropriately with IVF, IV Dilaudid via PCA and IV Toradol, as well as other adjunct therapies per sickle cell pain management  protocols. Patient claims his pain has been worse over the past several weeks and also developed some upper respiratory symptoms with chills and myalgias. During this admission he developed fever with temperature up to 102 F.  Blood culture was negative, respiratory panel was negative for all, chest x-ray showed minimal right basilar infiltrate for which patient was treated with IV ceftriaxone and azithromycin with significant improvement.  Patient was hemodynamically stable throughout admission. Fever resolved within 24 hours of starting antibiotics and remained fever free up to the time of discharge. Today, patient is doing well, pain is back to baseline, patient ambulating well, tolerating p.o. intake with no significant complaints. Patient was discharged home today in a hemodynamically stable condition. He will follow-up with primary care physician in the clinic within 1 week of this discharge. Patient was given excuse duty from work up to Wednesday, August 23, 2018.  Discharge Exam: Vitals:   08/19/18 0843 08/19/18 1005  BP:  120/76  Pulse:  81  Resp: 18 14  Temp:  98 F (36.7 C)  SpO2: 98% 100%   Vitals:   08/19/18 0613 08/19/18 0725 08/19/18 0843 08/19/18 1005  BP:  116/73  120/76  Pulse:  74  81  Resp: Temp:  98.6 F (37 C)  98 F (36.7 C)  TempSrc:  Oral  Oral  SpO2: 100% 97% 98% 100%  Weight:      Height:        General appearance : Awake,  alert, not in any distress. Speech Clear. Not toxic looking HEENT: Atraumatic and Normocephalic, pupils equally reactive to light and accomodation Neck: Supple, no JVD. No cervical lymphadenopathy.  Chest: Good air entry bilaterally, no added sounds  CVS: S1 S2 regular, no murmurs.  Abdomen: Bowel sounds present, Non tender and not distended with no gaurding, rigidity or rebound. Extremities: B/L Lower Ext shows no edema, both legs are warm to touch Neurology: Awake alert, and oriented X 3, CN II-XII intact, Non focal Skin:  No Rash  Discharge Instructions  Discharge Instructions    Diet - low sodium heart healthy   Complete by:  As directed    Increase activity slowly   Complete by:  As directed      Allergies as of 08/19/2018      Reactions   Shellfish Allergy Anaphylaxis      Medication List    TAKE these medications   azithromycin 250 MG tablet Commonly known as:  ZITHROMAX One tablet daily for 3 days Start taking on:  August 20, 2018   HYDROcodone-acetaminophen 5-325 MG tablet Commonly known as:  NORCO/VICODIN Take 1 tablet by mouth every 6 (six) hours as needed. What changed:  reasons to take this   ibuprofen 800 MG tablet Commonly known as:  ADVIL,MOTRIN Take 1 tablet (800 mg total) by mouth every 8 (eight) hours as needed. What changed:  reasons to take this       The results of significant diagnostics from this hospitalization (including imaging, microbiology, ancillary and laboratory) are listed below for reference.    Significant Diagnostic Studies: Dg Chest 2 View  Result Date: 08/15/2018 CLINICAL DATA:  History of sickle cell and fever EXAM: CHEST - 2 VIEW COMPARISON:  06/17/2018 FINDINGS: Cardiac shadow is within normal limits. The lungs are well aerated bilaterally. Minimal atelectasis/early infiltrate is noted in the right lung base. No sizable effusion is seen. No acute bony abnormality is noted. Stable changes of sickle cell in the thoracic spine are noted. IMPRESSION: Minimal right basilar infiltrate Electronically Signed   By: Alcide Clever M.D.   On: 08/15/2018 16:56    Microbiology: Recent Results (from the past 240 hour(s))  Culture, blood (routine x 2)     Status: None (Preliminary result)   Collection Time: 08/15/18  2:12 PM  Result Value Ref Range Status   Specimen Description   Final    BLOOD LEFT ARM Performed at [facility], [address], [place], [place] [address]    Special Requests   Final    BOTTLES DRAWN AEROBIC AND ANAEROBIC Blood  Culture adequate volume Performed at [facility], [address] [place], [place] [address]    Culture   Final    NO GROWTH 4 DAYS Performed at [facility], [address] [place], [place] [address]    Report Status PENDING  Incomplete  Culture, blood (routine x 2)     Status: None (Preliminary result)   Collection Time: 08/15/18  2:20 PM  Result Value Ref Range Status   Specimen Description   Final    BLOOD LEFT ARM Performed at [facility], [address] [place], [place] [address]    Special Requests   Final    BOTTLES DRAWN AEROBIC AND ANAEROBIC Blood Culture adequate volume Performed at [facility], [address], [place], [place] [address]    Culture   Final    NO GROWTH 4 DAYS Performed at [facility], [address] [place], [place] [address]    Report Status PENDING  Incomplete  Respiratory Panel by PCR     Status: None   Collection Time: 08/15/18  2:39 PM  Result Value Ref Range Status   Adenovirus NOT DETECTED NOT DETECTED Final   Coronavirus 229E NOT DETECTED NOT DETECTED Final    Comment: (NOTE) The Coronavirus on the Respiratory Panel, DOES NOT test for the novel  Coronavirus (2019 nCoV)    Coronavirus HKU1 NOT DETECTED NOT DETECTED Final   Coronavirus NL63 NOT DETECTED NOT DETECTED Final   Coronavirus OC43 NOT DETECTED NOT DETECTED Final   Metapneumovirus NOT DETECTED NOT DETECTED Final   Rhinovirus / Enterovirus NOT DETECTED NOT DETECTED Final   Influenza A NOT DETECTED NOT DETECTED Final   Influenza B NOT DETECTED NOT DETECTED Final   Parainfluenza Virus 1 NOT DETECTED NOT DETECTED Final   Parainfluenza Virus 2 NOT DETECTED NOT DETECTED Final   Parainfluenza Virus 3 NOT DETECTED NOT DETECTED Final   Parainfluenza Virus 4 NOT DETECTED NOT DETECTED Final   Respiratory Syncytial Virus NOT DETECTED NOT DETECTED Final   Bordetella pertussis NOT DETECTED NOT DETECTED Final    Chlamydophila pneumoniae NOT DETECTED NOT DETECTED Final   Mycoplasma pneumoniae NOT DETECTED NOT DETECTED Final    Comment: Performed at [facility], [address], [place], [place] [address]     Labs: Basic Metabolic Panel: Recent Labs  Lab 08/12/18 2030 08/13/18 2149 08/14/18 0335 08/15/18 0401 08/16/18 0332 08/18/18 0308  NA 138 135  --  135 136 140  K 3.5 4.5  --  3.9 3.4* 3.6  CL 111 105  --  106 105 106  CO2 23 21*  --  22 25 27   GLUCOSE 92 119*  --  98 119* 129*  BUN 8 7  --  10 9 8   CREATININE 0.91 0.84 0.78 0.81 0.67 0.83  CALCIUM 8.7* 9.1  --  8.6* 8.4* 8.5*   Liver Function Tests: Recent Labs  Lab 08/12/18 2030 08/13/18 2149 08/16/18 0332  AST 27 104* 47*  ALT 16 29 22   ALKPHOS 81 235* 203*  BILITOT 1.3* 1.9* 1.7*  PROT 7.0 7.6 6.7  ALBUMIN 4.2 4.3 3.5   No results for input(s): LIPASE, AMYLASE in the last 168 hours. No results for input(s): AMMONIA in the last 168 hours. CBC: Recent Labs  Lab 08/12/18 2030 08/13/18 2149 08/14/18 0335 08/15/18 0401 08/16/18 0332 08/18/18 0308  WBC 12.6* 24.6* 18.5* 11.0* 9.5 9.2  NEUTROABS 4.8 20.9*  --   --  6.3 4.3  HGB 10.3* 10.4* 9.2* 9.9* 9.4* 7.9*  HCT 28.5* 31.0* 26.9* 28.9* 26.9* 23.0*  MCV 76.6* 80.3 79.8* 79.4* 78.4* 78.8*  PLT 316 322 277 228 172 157   Cardiac Enzymes: No results for input(s): CKTOTAL, CKMB, CKMBINDEX, TROPONINI in the last 168 hours. BNP: Invalid input(s): POCBNP CBG: No results for input(s): GLUCAP in the last 168 hours.  Time coordinating discharge: 50 minutes  Signed:  Jaidan Stachnik  [facility] 08/19/2018, 12:10 PM

## 2018-08-19 NOTE — Progress Notes (Signed)
Pt c/o of freezing. He stated that he sweat all night and soaked his bed. He stated that he changed the sheets himself that were brought to him by a NA. He has taken all of his clothes  Because "they are wet from sweating". His bedsheets and blankets are dry this am. Upon entering his room he appears to be sleeping. His VS are WNL.

## 2018-08-19 NOTE — Progress Notes (Signed)
Pt denies being cold and sweating. He verbalized understanding of D/C  instructions and prescription to pick up.

## 2018-08-19 NOTE — Progress Notes (Signed)
Pt would not comply with policy and keep his EtCO2 monitor in his nose. Pt was asked multiple times to keep the sensor in his nose. RN turned PCA off.

## 2018-08-19 NOTE — Discharge Instructions (Signed)
Sickle Cell Anemia, Adult  Sickle cell anemia is a condition in which red blood cells have an abnormal "sickle" shape. Red blood cells carry oxygen through the body. Sickle-shaped red blood cells do not live as long as normal red blood cells. They also clump together and block blood from flowing through the blood vessels. This condition prevents the body from getting enough oxygen. Sickle cell anemia causes organ damage and pain. It also increases the risk of infection. What are the causes? This condition is caused by a gene that is passed from parent to child (inherited). Receiving two copies of the gene causes the disease. Receiving one copy causes the "trait," which means that symptoms are milder or not present. What increases the risk? This condition is more likely to develop if your ancestors were from [place], the Mediterranean, [place] or [place], the [place], [place], or the [place]. What are the signs or symptoms? Symptoms of this condition include:  Episodes of pain (crises), especially in the hands and feet, joints, back, chest, or abdomen. The pain can be triggered by: ? An illness, especially if there is dehydration. ? Doing an activity with great effort (overexertion). ? Exposure to extreme temperature changes. ? High altitude.  Fatigue.  Shortness of breath or difficulty breathing.  Dizziness.  Pale skin or yellowed skin (jaundice).  Frequent bacterial infections.  Pain and swelling in the hands and feet (hand-food syndrome).  Prolonged, painful erection of the penis (priapism).  Acute chest syndrome. Symptoms of this include: ? Chest pain. ? Fever. ? Cough. ? Fast breathing.  Stroke.  Decreased activity.  Loss of appetite.  Change in behavior.  Headaches.  Seizures.  Vision changes.  Skin ulcers.  Heart disease.  High blood pressure.  Gallstones.  Liver and kidney problems. How is this diagnosed? This condition is diagnosed with  blood tests that check for the gene that causes this condition. How is this treated? There is no cure for most cases of this condition. Treatment focuses on managing your symptoms and preventing complications of the disease. Your health care provider will work with you to identify the best treatment options for you based on an assessment of your condition. Treatment may include:  Medicines, including: ? Pain medicines. ? Antibiotic medicines for infection. ? Medicines to increase the production of a protein in red blood cells that helps carry oxygen in the body (hemoglobin).  Fluids to treat pain and swelling.  Oxygen to treat acute chest syndrome.  Blood transfusions to treat symptoms such as fatigue, stroke, and acute chest syndrome.  Massage and physical therapy for pain.  Regular tests to monitor your condition, such as blood tests, X-rays, CT scans, MRI scans, ultrasounds, and lung function tests. These should be done every 3-12 months, depending on your age.  Hematopoietic stem cell transplant. This is a procedure to replace abnormal stem cells with healthy stem cells from a donor's bone marrow. Stem cells are cells that can develop into blood cells, and bone marrow is the spongy tissue inside the bones. Follow these instructions at home: Medicines  Take over-the-counter and prescription medicines only as told by your health care provider.  If you were prescribed an antibiotic medicine, take it as told by your health care provider. Do not stop taking the antibiotic even if you start to feel better.  If you develop a fever, do not take medicines to reduce the fever right away. This could cover up another problem. Notify your health care provider. Managing   pain, stiffness, and swelling  Try these methods to help ease your pain: ? Using a heating pad. ? Taking a warm bath. ? Distracting yourself, such as by watching TV. Eating and drinking  Drink enough fluid to keep your urine  clear or pale yellow. Drink more in hot weather and during exercise.  Limit or avoid drinking alcohol.  Eat a balanced and nutritious diet. Eat plenty of fruits, vegetables, whole grains, and lean protein.  Take vitamins and supplements as directed by your health care provider. Traveling  When traveling, keep these with you: ? Your medical information. ? The names of your health care providers. ? Your medicines.  If you have to travel by air, ask about precautions you should take. Activity  Get plenty of rest.  Avoid activities that will lower your oxygen levels, such as exercising vigorously. General instructions  Do not use any products that contain nicotine or tobacco, such as cigarettes and e-cigarettes. They lower blood oxygen levels. If you need help quitting, ask your health care provider.  Consider wearing a medical alert bracelet.  Avoid high altitudes.  Avoid extreme temperatures and extreme temperature changes.  Keep all follow-up visits as told by your health care provider. This is important. Contact a health care provider if:  You develop joint pain.  Your feet or hands swell or have pain.  You have fatigue. Get help right away if:  You have symptoms of infection. These include: ? Fever. ? Chills. ? Extreme tiredness. ? Irritability. ? Poor eating. ? Vomiting.  You feel dizzy or faint.  You have new abdominal pain, especially on the left side near the stomach area.  You develop priapism.  You have numbness in your arms or legs or have trouble moving them.  You have trouble talking.  You develop pain that cannot be controlled with medicine.  You become short of breath.  You have rapid breathing.  You have a persistent cough.  You have pain in your chest.  You develop a severe headache or stiff neck.  You feel bloated without eating or after eating a small amount of food.  Your skin is pale.  You suddenly lose  vision. Summary  Sickle cell anemia is a condition in which red blood cells have an abnormal "sickle" shape. This disease can cause organ damage and chronic pain, and it can raise your risk of infection.  Sickle cell anemia is a genetic disorder.  Treatment focuses on managing your symptoms and preventing complications of the disease.  Get medical help right away if you have any signs of infection, such as a fever. This information is not intended to replace advice given to you by your health care provider. Make sure you discuss any questions you have with your health care provider. Document Released: 09/08/2005 Document Revised: 07/06/2016 Document Reviewed: 07/06/2016 Elsevier Interactive Patient Education  2019 [employer]  

## 2018-08-20 LAB — CULTURE, BLOOD (ROUTINE X 2)
CULTURE: NO GROWTH
Culture: NO GROWTH
Special Requests: ADEQUATE
Special Requests: ADEQUATE

## 2018-08-21 ENCOUNTER — Ambulatory Visit (INDEPENDENT_AMBULATORY_CARE_PROVIDER_SITE_OTHER): Payer: Self-pay | Admitting: Family Medicine

## 2018-08-21 ENCOUNTER — Other Ambulatory Visit: Payer: Self-pay

## 2018-08-21 ENCOUNTER — Encounter: Payer: Self-pay | Admitting: Family Medicine

## 2018-08-21 VITALS — BP 117/76 | HR 100 | Temp 98.7°F | Ht 68.0 in | Wt 148.0 lb

## 2018-08-21 DIAGNOSIS — A749 Chlamydial infection, unspecified: Secondary | ICD-10-CM

## 2018-08-21 DIAGNOSIS — D571 Sickle-cell disease without crisis: Secondary | ICD-10-CM

## 2018-08-21 DIAGNOSIS — Z113 Encounter for screening for infections with a predominantly sexual mode of transmission: Secondary | ICD-10-CM

## 2018-08-21 NOTE — Patient Instructions (Signed)
Sickle Cell Anemia, Adult °Sickle cell anemia is a condition where your red blood cells are shaped like sickles. Red blood cells carry oxygen through the body. Sickle-shaped cells do not live as long as normal red blood cells. They also clump together and block blood from flowing through the blood vessels. This prevents the body from getting enough oxygen. Sickle cell anemia causes organ damage and pain. It also increases the risk of infection. °Follow these instructions at home: °Medicines °· Take over-the-counter and prescription medicines only as told by your doctor. °· If you were prescribed an antibiotic medicine, take it as told by your doctor. Do not stop taking the antibiotic even if you start to feel better. °· If you develop a fever, do not take medicines to lower the fever right away. Tell your doctor about the fever. °Managing pain, stiffness, and swelling °· Try these methods to help with pain: °? Use a heating pad. °? Take a warm bath. °? Distract yourself, such as by watching TV. °Eating and drinking °· Drink enough fluid to keep your pee (urine) clear or pale yellow. Drink more in hot weather and during exercise. °· Limit or avoid alcohol. °· Eat a healthy diet. Eat plenty of fruits, vegetables, whole grains, and lean protein. °· Take vitamins and supplements as told by your doctor. °Traveling °· When traveling, keep these with you: °? Your medical information. °? The names of your doctors. °? Your medicines. °· If you need to take an airplane, talk to your doctor first. °Activity °· Rest often. °· Avoid exercises that make your heart beat much faster, such as jogging. °General instructions °· Do not use products that have nicotine or tobacco, such as cigarettes and e-cigarettes. If you need help quitting, ask your doctor. °· Consider wearing a medical alert bracelet. °· Avoid being in high places (high altitudes), such as mountains. °· Avoid very hot or cold temperatures. °· Avoid places where the  temperature changes a lot. °· Keep all follow-up visits as told by your doctor. This is important. °Contact a doctor if: °· A joint hurts. °· Your feet or hands hurt or swell. °· You feel tired (fatigued). °Get help right away if: °· You have symptoms of infection. These include: °? Fever. °? Chills. °? Being very tired. °? Irritability. °? Poor eating. °? Throwing up (vomiting). °· You feel dizzy or faint. °· You have new stomach pain, especially on the left side. °· You have a an erection (priapism) that lasts more than 4 hours. °· You have numbness in your arms or legs. °· You have a hard time moving your arms or legs. °· You have trouble talking. °· You have pain that does not go away when you take medicine. °· You are short of breath. °· You are breathing fast. °· You have a long-term cough. °· You have pain in your chest. °· You have a bad headache. °· You have a stiff neck. °· Your stomach looks bloated even though you did not eat much. °· Your skin is pale. °· You suddenly cannot see well. °Summary °· Sickle cell anemia is a condition where your red blood cells are shaped like sickles. °· Follow your doctor's advice on ways to manage pain, food to eat, activities to do, and steps to take for safe travel. °· Get medical help right away if you have any signs of infection, such as a fever. °This information is not intended to replace advice given to you by your   health care provider. Make sure you discuss any questions you have with your health care provider. °Document Released: 03/21/2013 Document Revised: 07/06/2016 Document Reviewed: 07/06/2016 °Elsevier Interactive Patient Education © 2019 [employer] ° °

## 2018-08-21 NOTE — Progress Notes (Signed)
PATIENT [facility]ARE C[facility] INTERNAL MEDICINE AND SICKLE CELL CARE  SICKLE CELL ANEMIA FOLLOW UP VISIT PROVIDER: Mike Gip, FNP    Subjective:   William Jennings  is a 28 y.o.  male who  has a past medical history of Chronic prescription opiate use, Sickle cell anemia (HCC), and Sickle cell disease (HCC). presents for a follow up for Sickle Cell Anemia. Patient states that he is having increased pain and pressure in the lower back. He states that this is not like his previous sickle cell crises. He states that he was hospitalized from 3/1-08/19/2018. He is continuing to have back pain and pressure. He denies taking any pain medications. He states that he does not want to have dilaudid due to it causing him to have increased night sweats. He states that he can "smell the dilaudid coming out of my skin". He is scared to take any other medications for pain at the present time.  Patient states that he has daily stressors with work and family that may be contributing to his pain.  The patient has had 3 admissions in the past 6 months.  Pain regimen includes: Ibuprofen and tylenol. He has a script for norco and has not taken it.  Hydrea Therapy: Nohas taken in the past and states that they increased his crises.  Medication compliance: not taking any medications for pain at the present time.   Pain today is 8/10 and is described as pressure.  The patient reports adequate daily hydration.     Review of Systems  Constitutional: Negative.   HENT: Negative.   Eyes: Negative.   Respiratory: Negative.   Cardiovascular: Negative.   Gastrointestinal: Negative.   Genitourinary: Negative.   Musculoskeletal: Positive for myalgias.  Skin: Negative.   Neurological: Negative.   Psychiatric/Behavioral: Negative.     Objective:   Objective  BP 117/76   Pulse 100   Temp 98.7 F (37.1 C) (Oral)   Ht 5\' 8"  (1.727 m)   Wt 148 lb (67.1 kg)   SpO2 100%   BMI 22.50 kg/m   Wt Readings from Last 3  Encounters:  08/21/18 148 lb (67.1 kg)  08/19/18 146 lb 6.2 oz (66.4 kg)  08/13/18 150 lb (68 kg)     Physical Exam Vitals signs and nursing note reviewed.  Constitutional:      General: He is not in acute distress.    Appearance: He is well-developed.  HENT:     Head: Normocephalic and atraumatic.  Eyes:     Conjunctiva/sclera: Conjunctivae normal.     Pupils: Pupils are equal, round, and reactive to light.  Neck:     Musculoskeletal: Normal range of motion.  Cardiovascular:     Rate and Rhythm: Normal rate and regular rhythm.     Heart sounds: Normal heart sounds.  Pulmonary:     Effort: Pulmonary effort is normal. No respiratory distress.     Breath sounds: Normal breath sounds.  Abdominal:     General: Bowel sounds are normal. There is no distension.     Palpations: Abdomen is soft.  Musculoskeletal: Normal range of motion.  Skin:    General: Skin is warm and dry.  Neurological:     Mental Status: He is alert and oriented to person, place, and time.  Psychiatric:        Mood and Affect: Mood normal.        Behavior: Behavior normal.        Thought Content: Thought content normal.  Judgment: Judgment normal.      Assessment/Plan:   Assessment   Encounter Diagnoses  Name Primary?  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 Routine screening for STI (sexually transmitted infection)      Plan  1. Hb-SS disease without crisis [facility]) Advised patient to use tylenol and ibuprofen for pain.  - POCT urinalysis dipstick  2. Routine screening for STI (sexually transmitted infection) No symptoms at the present time.  - Chlamydia/GC NAA, Confirmation  Return to care as scheduled and prn. Patient verbalized understanding and agreed with plan of care.   1. Sickle cell disease -  We discussed the need for good hydration, monitoring of hydration status, avoidance of heat, cold, stress, and infection triggers. We discussed the risks and benefits of Hydrea, including  bone marrow suppression, the possibility of GI upset, skin ulcers, hair thinning, and teratogenicity. The patient was reminded of the need to seek medical attention of any symptoms of bleeding, anemia, or infection. Continue folic acid 1 mg daily to prevent aplastic bone marrow crises.   2. Pulmonary evaluation - Patient denies severe recurrent wheezes, shortness of breath with exercise, or persistent cough. If these symptoms develop, pulmonary function tests with spirometry will be ordered, and if abnormal, plan on referral to Pulmonology for further evaluation.  3. Cardiac - Routine screening for pulmonary hypertension is not recommended.  4. Eye - High risk of proliferative retinopathy. Annual eye exam with retinal exam recommended to patient.  5. Immunization status -  Yearly influenza vaccination is recommended, as well as being up to date with Meningococcal and Pneumococcal vaccines.   6. Acute and chronic painful episodes - We discussed that pt is to receive Schedule II prescriptions only from [place]. Pt is also aware that the prescription history is available to [place] online through the [facility] CSRS. Controlled substance agreement signed. We reminded [name] that all patients receiving Schedule II narcotics must be seen for follow within one month of prescription being requested. We reviewed the terms of our pain agreement, including the need to keep medicines in a safe locked location away from children or pets, and the need to report excess sedation or constipation, measures to avoid constipation, and policies related to early refills and stolen prescriptions. According to the [place] [facility] program, we have reviewed details related to analgesia, adverse effects, aberrant behaviors.  7. Iron overload from chronic transfusion.  Not applicable at this time.  If this occurs will use Exjade for management.   8. Vitamin D deficiency - Drisdol 50,000 units weekly. Patient encouraged to take  as prescribed.   The above recommendations are taken from the NIH Evidence-Based Management of Sickle Cell Disease: Expert Panel Report, 03559.   Ms. Andr L. Riley Lam, FNP-BC Patient Care Center [facility] [address] [place], [place] [address] [phone]  This note has been created with Dragon speech recognition software and smart phrase technology. Any transcriptional errors are unintentional.

## 2018-08-26 MED ORDER — AZITHROMYCIN 500 MG PO TABS: 1000.0000 mg | ORAL_TABLET | Freq: Every day | ORAL | 0 refills | Status: AC

## 2018-08-29 ENCOUNTER — Telehealth: Payer: Self-pay

## 2018-08-29 DIAGNOSIS — D5700 Hb-SS disease with crisis, unspecified: Secondary | ICD-10-CM

## 2018-08-29 LAB — CHLAMYDIA/GC NAA, CONFIRMATION
Chlamydia trachomatis, NAA: POSITIVE — AB
Neisseria gonorrhoeae, NAA: NEGATIVE

## 2018-08-29 LAB — C. TRACHOMATIS NAA, CONFIRM: C. trachomatis NAA, Confirm: POSITIVE — AB

## 2018-08-29 MED ORDER — IBUPROFEN 800 MG PO TABS: 800.0000 mg | ORAL_TABLET | Freq: Three times a day (TID) | ORAL | 2 refills | Status: DC | PRN

## 2018-08-29 MED ORDER — HYDROCODONE-ACETAMINOPHEN 5-325 MG PO TABS: 1.0000 | ORAL_TABLET | Freq: Four times a day (QID) | ORAL | 0 refills | Status: DC | PRN

## 2018-08-29 NOTE — Telephone Encounter (Signed)
refilled 

## 2018-09-18 ENCOUNTER — Encounter: Payer: Self-pay | Admitting: Family Medicine

## 2018-09-18 ENCOUNTER — Other Ambulatory Visit: Payer: Self-pay

## 2018-09-18 NOTE — Progress Notes (Deleted)
  Patient Care Center Internal Medicine and Sickle Cell Care  Virtual Visit via Telephone Note  I connected with [name] on 09/18/18 at  8:00 AM EDT by telephone and verified that I am speaking with the correct person using two identifiers.   I discussed the limitations, risks, security and privacy concerns of performing an evaluation and management service by telephone and the availability of in person appointments. I also discussed with the patient that there may be a patient responsible charge related to this service. The patient expressed understanding and agreed to proceed.   History of Present Illness:    Observations/Objective:   Assessment and Plan:   Follow Up Instructions:    I discussed the assessment and treatment plan with the patient. The patient was provided an opportunity to ask questions and all were answered. The patient agreed with the plan and demonstrated an understanding of the instructions.   The patient was advised to call back or seek an in-person evaluation if the symptoms worsen or if the condition fails to improve as anticipated.  I provided *** minutes of non-face-to-face time during this encounter.  Ms. Andr L. Riley Lam, FNP-BC Patient Care Center [facility] [address] [place], [place] [address] [phone]

## 2018-09-29 NOTE — Telephone Encounter (Signed)
error 

## 2018-10-02 NOTE — Progress Notes (Deleted)
Error

## 2018-10-11 ENCOUNTER — Telehealth: Payer: Self-pay

## 2018-10-13 MED ORDER — HYDROCODONE-ACETAMINOPHEN 5-325 MG PO TABS: 1.0000 | ORAL_TABLET | Freq: Four times a day (QID) | ORAL | 0 refills | Status: DC | PRN

## 2018-10-13 NOTE — Telephone Encounter (Signed)
Sent!

## 2019-01-02 ENCOUNTER — Telehealth: Payer: Self-pay

## 2019-01-03 ENCOUNTER — Other Ambulatory Visit: Payer: Self-pay | Admitting: Internal Medicine

## 2019-01-03 MED ORDER — HYDROCODONE-ACETAMINOPHEN 5-325 MG PO TABS: 1.0000 | ORAL_TABLET | Freq: Four times a day (QID) | ORAL | 0 refills | Status: AC | PRN

## 2019-01-03 NOTE — Telephone Encounter (Signed)
Refilled

## 2019-02-16 ENCOUNTER — Other Ambulatory Visit: Payer: Self-pay

## 2019-02-16 ENCOUNTER — Encounter (HOSPITAL_BASED_OUTPATIENT_CLINIC_OR_DEPARTMENT_OTHER): Payer: Self-pay | Admitting: *Deleted

## 2019-02-16 ENCOUNTER — Emergency Department (HOSPITAL_BASED_OUTPATIENT_CLINIC_OR_DEPARTMENT_OTHER)
Admission: EM | Admit: 2019-02-16 | Discharge: 2019-02-16 | Disposition: A | Discharge status: Home / Self Care | Payer: Medicaid Other | Attending: Emergency Medicine | Admitting: Emergency Medicine

## 2019-02-16 DIAGNOSIS — Z87891 Personal history of nicotine dependence: Secondary | ICD-10-CM | POA: Insufficient documentation

## 2019-02-16 DIAGNOSIS — M79601 Pain in right arm: Secondary | ICD-10-CM | POA: Diagnosis present

## 2019-02-16 DIAGNOSIS — D5700 Hb-SS disease with crisis, unspecified: Secondary | ICD-10-CM | POA: Diagnosis not present

## 2019-02-16 LAB — RETICULOCYTES
Immature Retic Fract: 33.3 % — ABNORMAL HIGH (ref 2.3–15.9)
RBC.: 4.42 MIL/uL (ref 4.22–5.81)
Retic Count, Absolute: 173.7 10*3/uL (ref 19.0–186.0)
Retic Ct Pct: 3.9 % — ABNORMAL HIGH (ref 0.4–3.1)

## 2019-02-16 LAB — BASIC METABOLIC PANEL
Anion gap: 10 (ref 5–15)
BUN: 7 mg/dL (ref 6–20)
CO2: 24 mmol/L (ref 22–32)
Calcium: 9.1 mg/dL (ref 8.9–10.3)
Chloride: 103 mmol/L (ref 98–111)
Creatinine, Ser: 0.9 mg/dL (ref 0.61–1.24)
GFR calc Af Amer: >60 mL/min (ref >60)
GFR calc non Af Amer: >60 mL/min (ref >60)
Glucose, Bld: 118 mg/dL — ABNORMAL HIGH (ref 70–99)
Potassium: 4.1 mmol/L (ref 3.5–5.1)
Sodium: 137 mmol/L (ref 135–145)

## 2019-02-16 LAB — CBC
HCT: 34.3 % — ABNORMAL LOW (ref 39.0–52.0)
Hemoglobin: 12.5 g/dL — ABNORMAL LOW (ref 13.0–17.0)
MCH: 28.5 pg (ref 26.0–34.0)
MCHC: 36.4 g/dL — ABNORMAL HIGH (ref 30.0–36.0)
MCV: 78.3 fL — ABNORMAL LOW (ref 80.0–100.0)
Platelets: 232 10*3/uL (ref 150–400)
RBC: 4.38 MIL/uL (ref 4.22–5.81)
RDW: 13.7 % (ref 11.5–15.5)
WBC: 6.8 10*3/uL (ref 4.0–10.5)
nRBC: 1.6 % — ABNORMAL HIGH (ref 0.0–0.2)

## 2019-02-16 MED ORDER — HYDROMORPHONE HCL 1 MG/ML IJ SOLN
INTRAMUSCULAR | Status: AC
  Filled 2019-02-16: qty 1

## 2019-02-16 MED ORDER — KETOROLAC TROMETHAMINE 15 MG/ML IJ SOLN
15.0000 mg | Freq: Once | INTRAMUSCULAR | Status: AC
  Administered 2019-02-16: 15 mg via INTRAVENOUS
  Filled 2019-02-16: qty 1

## 2019-02-16 MED ORDER — KETOROLAC TROMETHAMINE 15 MG/ML IJ SOLN
INTRAMUSCULAR | Status: AC
  Filled 2019-02-16: qty 1

## 2019-02-16 MED ORDER — HYDROMORPHONE HCL 1 MG/ML IJ SOLN
1.0000 mg | Freq: Once | INTRAMUSCULAR | Status: AC
  Administered 2019-02-16: 1 mg via INTRAVENOUS
  Filled 2019-02-16: qty 1

## 2019-02-16 MED ORDER — HYDROMORPHONE HCL 1 MG/ML IJ SOLN
1.0000 mg | Freq: Once | INTRAMUSCULAR | Status: AC
  Administered 2019-02-16: 10:00:00 1 mg via INTRAVENOUS
  Filled 2019-02-16: qty 1

## 2019-02-16 NOTE — ED Provider Notes (Signed)
[facility] Emergency Department Provider Note MRN:  [id number]  Arrival date & time: 02/16/19     Chief Complaint   Sickle cell crisis   History of Present Illness   William Jennings is a 28 y.o. year-old male with a history of sickle cell disease presenting to the ED with chief complaint of pain crisis.  Patient is endorsing pain to the right arm and right leg for the past 2 days.  Gradual onset, progressively worsening.  Not responding to home hydrocodone medication.  Called the sickle cell clinic this morning, but they are full.  Denies fever, no headache or vision change, no chest pain or shortness of breath, no abdominal pain, no dysuria.  Pain is 8 out of 10, constant, no exacerbating or alleviating factors.  Denies trauma.  Review of Systems  A complete 10 system review of systems was obtained and all systems are negative except as noted in the HPI and PMH.   Patient's Health History    Past Medical History:  Diagnosis Date   Chronic prescription opiate use    Sickle cell anemia (HCC)    Sickle cell disease ([place])     Past Surgical History:  Procedure Laterality Date   CHOLECYSTECTOMY      Family History  Family history unknown: Yes    Social History   Socioeconomic History   Marital status: Single    Spouse name: Not on file   Number of children: Not on file   Years of education: Not on file   Highest education level: Not on file  Occupational History   Not on file  Social Needs   Financial resource strain: Not on file   Food insecurity    Worry: Not on file    Inability: Not on file   Transportation needs    Medical: Not on file    Non-medical: Not on file  Tobacco Use   Smoking status: Former Smoker   Smokeless tobacco: Never Used  Substance and Sexual Activity   Alcohol use: Yes    Comment: occ   Drug use: No   Sexual activity: Not on file  Lifestyle   Physical activity    Days per week: Not on file     Minutes per session: Not on file   Stress: Not on file  Relationships   Social connections    Talks on phone: Not on file    Gets together: Not on file    Attends religious service: Not on file    Active member of club or organization: Not on file    Attends meetings of clubs or organizations: Not on file    Relationship status: Not on file   Intimate partner violence    Fear of current or ex partner: Not on file    Emotionally abused: Not on file    Physically abused: Not on file    Forced sexual activity: Not on file  Other Topics Concern   Not on file  Social History Narrative   Not on file     Physical Exam  Vital Signs and Nursing Notes reviewed Vitals:   02/16/19 0954  BP: (!) 118/93  Pulse: (!) 53  Resp: 14  Temp: 98.1 F (36.7 C)  SpO2: 100%    CONSTITUTIONAL: Well-appearing, NAD NEURO:  Alert and oriented x 3, no focal deficits EYES:  eyes equal and reactive ENT/NECK:  no LAD, no JVD CARDIO: Regular rate, well-perfused, normal S1 and S2 PULM:  CTAB no wheezing or rhonchi GI/GU:  normal bowel sounds, non-distended, non-tender MSK/SPINE:  No gross deformities, no edema SKIN:  no rash, atraumatic PSYCH:  Appropriate speech and behavior  Diagnostic and Interventional Summary    Labs Reviewed  CBC - Abnormal; Notable for the following components:      Result Value   Hemoglobin 12.5 (*)    HCT 34.3 (*)    MCV 78.3 (*)    MCHC 36.4 (*)    nRBC 1.6 (*)    All other components within normal limits  BASIC METABOLIC PANEL - Abnormal; Notable for the following components:   Glucose, Bld 118 (*)    All other components within normal limits  RETICULOCYTES - Abnormal; Notable for the following components:   Retic Ct Pct 3.9 (*)    Immature Retic Fract 33.3 (*)    All other components within normal limits    No orders to display    Medications  ketorolac (TORADOL) 15 MG/ML injection 15 mg (has no administration in time range)  HYDROmorphone  (DILAUDID) injection 1 mg (has no administration in time range)  HYDROmorphone (DILAUDID) injection 1 mg (1 mg Intravenous Given 02/16/19 1027)  HYDROmorphone (DILAUDID) injection 1 mg (1 mg Intravenous Given 02/16/19 1106)     Procedures Critical Care  ED Course and Medical Decision Making  I have reviewed the triage vital signs and the nursing notes.  Pertinent labs & imaging results that were available during my care of the patient were reviewed by me and considered in my medical decision making (see below for details).  Normal vital signs, well-appearing, seems to be an uncomplicated sickle cell pain crisis without chest pain or shortness of breath, no abnormalities noted on physical exam to the painful extremities.  Will obtain labs, evaluate reticulocyte count, provide pain management, attempt discharge.  Labs reassuring, patient feeling better, feels well enough to go home.  Appropriate for discharge.  Elmer SowMichael M. Pilar PlateBero, MD [facility] [email or web address]  Final Clinical Impressions(s) / ED Diagnoses     ICD-10-CM   1. Sickle cell pain crisis [facility](HCC)  D57.00     ED Discharge Orders    None      Discharge Instructions Discussed with and Provided to Patient:   Discharge Instructions     You were evaluated in the Emergency Department and after careful evaluation, we did not find any emergent condition requiring admission or further testing in the hospital.  Your testing today was reassuring.  Please continue to take your home pain medications at home and follow-up in the sickle cell clinic.  Please return to the Emergency Department if you experience any worsening of your condition.  We encourage you to follow up with a primary care provider.  Thank you for allowing us to be a part of your care.       Sabas SousBero, Durga Saldarriaga M, MD 02/16/19 1240

## 2019-02-16 NOTE — ED Notes (Signed)
ED Provider at bedside. 

## 2019-02-16 NOTE — ED Triage Notes (Signed)
Right leg and right arm pain 2 days ago.  Took Hydrocodone and Ibuprofen without any relief.

## 2019-02-16 NOTE — Discharge Instructions (Addendum)
You were evaluated in the Emergency Department and after careful evaluation, we did not find any emergent condition requiring admission or further testing in the hospital.  Your testing today was reassuring.  Please continue to take your home pain medications at home and follow-up in the sickle cell clinic.  Please return to the Emergency Department if you experience any worsening of your condition.  We encourage you to follow up with a primary care provider.  Thank you for allowing [place] to be a part of your care.

## 2019-02-21 ENCOUNTER — Encounter (HOSPITAL_COMMUNITY): Payer: Self-pay | Admitting: *Deleted

## 2019-02-21 ENCOUNTER — Encounter (HOSPITAL_COMMUNITY): Payer: Self-pay

## 2019-03-06 ENCOUNTER — Ambulatory Visit (INDEPENDENT_AMBULATORY_CARE_PROVIDER_SITE_OTHER): Payer: Medicaid Other | Admitting: Family Medicine

## 2019-03-06 ENCOUNTER — Encounter: Payer: Self-pay | Admitting: Family Medicine

## 2019-03-06 ENCOUNTER — Other Ambulatory Visit: Payer: Self-pay

## 2019-03-06 VITALS — BP 115/53 | HR 86 | Temp 98.2°F | Resp 14 | Ht 68.0 in | Wt 154.0 lb

## 2019-03-06 DIAGNOSIS — D5720 Sickle-cell/Hb-C disease without crisis: Secondary | ICD-10-CM

## 2019-03-06 DIAGNOSIS — Z202 Contact with and (suspected) exposure to infections with a predominantly sexual mode of transmission: Secondary | ICD-10-CM

## 2019-03-06 LAB — POCT URINALYSIS DIPSTICK
Bilirubin, UA: NEGATIVE
Blood, UA: NEGATIVE
Glucose, UA: NEGATIVE
Ketones, UA: NEGATIVE
Leukocytes, UA: NEGATIVE
Nitrite, UA: NEGATIVE
Protein, UA: NEGATIVE
Spec Grav, UA: 1.02 (ref 1.010–1.025)
Urobilinogen, UA: 1 E.U./dL
pH, UA: 7 (ref 5.0–8.0)

## 2019-03-06 MED ORDER — FOLIC ACID 1 MG PO TABS: 1.0000 mg | ORAL_TABLET | Freq: Every day | ORAL | 1 refills | Status: DC

## 2019-03-06 NOTE — Patient Instructions (Addendum)
Will follow up by phone with lab results Discussed the importance of drinking 64 ounces of water daily. The Importance of Water. To help prevent pain crises, it is important to drink plenty of water throughout the day. This is because dehydration of red blood cells may lead to the sickling process.      Sickle Cell Anemia, Adult  Sickle cell anemia is a condition where your red blood cells are shaped like sickles. Red blood cells carry oxygen through the body. Sickle-shaped cells do not live as long as normal red blood cells. They also clump together and block blood from flowing through the blood vessels. This prevents the body from getting enough oxygen. Sickle cell anemia causes organ damage and pain. It also increases the risk of infection. Follow these instructions at home: Medicines  Take over-the-counter and prescription medicines only as told by your doctor.  If you were prescribed an antibiotic medicine, take it as told by your doctor. Do not stop taking the antibiotic even if you start to feel better.  If you develop a fever, do not take medicines to lower the fever right away. Tell your doctor about the fever. Managing pain, stiffness, and swelling  Try these methods to help with pain: ? Use a heating pad. ? Take a warm bath. ? Distract yourself, such as by watching TV. Eating and drinking  Drink enough fluid to keep your pee (urine) clear or pale yellow. Drink more in hot weather and during exercise.  Limit or avoid alcohol.  Eat a healthy diet. Eat plenty of fruits, vegetables, whole grains, and lean protein.  Take vitamins and supplements as told by your doctor. Traveling  When traveling, keep these with you: ? Your medical information. ? The names of your doctors. ? Your medicines.  If you need to take an airplane, talk to your doctor first. Activity  Rest often.  Avoid exercises that make your heart beat much faster, such as jogging. General instructions   Do not use products that have nicotine or tobacco, such as cigarettes and e-cigarettes. If you need help quitting, ask your doctor.  Consider wearing a medical alert bracelet.  Avoid being in high places (high altitudes), such as mountains.  Avoid very hot or cold temperatures.  Avoid places where the temperature changes a lot.  Keep all follow-up visits as told by your doctor. This is important. Contact a doctor if:  A joint hurts.  Your feet or hands hurt or swell.  You feel tired (fatigued). Get help right away if:  You have symptoms of infection. These include: ? Fever. ? Chills. ? Being very tired. ? Irritability. ? Poor eating. ? Throwing up (vomiting).  You feel dizzy or faint.  You have new stomach pain, especially on the left side.  You have a an erection (priapism) that lasts more than 4 hours.  You have numbness in your arms or legs.  You have a hard time moving your arms or legs.  You have trouble talking.  You have pain that does not go away when you take medicine.  You are short of breath.  You are breathing fast.  You have a long-term cough.  You have pain in your chest.  You have a bad headache.  You have a stiff neck.  Your stomach looks bloated even though you did not eat much.  Your skin is pale.  You suddenly cannot see well. Summary  Sickle cell anemia is a condition where your red blood  cells are shaped like sickles.  Follow your doctor's advice on ways to manage pain, food to eat, activities to do, and steps to take for safe travel.  Get medical help right away if you have any signs of infection, such as a fever. This information is not intended to replace advice given to you by your health care provider. Make sure you discuss any questions you have with your health care provider. Document Released: 03/21/2013 Document Revised: 09/22/2018 Document Reviewed: 07/06/2016 Elsevier Patient Education  2020 [employer].

## 2019-03-06 NOTE — Progress Notes (Signed)
Established Patient Office Visit  Subjective:  Patient ID: William Jennings, male    DOB: 1990-11-10  Age: 28 y.o. MRN: [id number]  CC:  Chief Complaint  Patient presents with  . Sickle Cell Anemia  . Exposure to STD    HPI William Jennings presents for a follow up on sickle cell disease and possible exposure to sexually transmitted disease.  Patient has a history of sickle cell disease, type [place].  William Jennings's sickle cell disease is well controlled he rarely has sickle cell pain crisis.  He typically takes folic acid daily and hydrates frequently.  Patient primarily uses ibuprofen for pain related to sickle cell disease. He denies headache, dizziness, chest pain, shortness of breath, nausea, vomiting, or diarrhea. Patient does not have a hematologist.   Patient is complaining of possible exposure to sexually transmitted disease.  He recently found out that partner was having sexual intercourse with another person without using barrier protection.  Patient states that he was consistently not using barrier protection with his partner.  He denies fatigue, urinary frequency, urinary urgency, penile burning, penile discharge, or dysuria.  Also, patient denies fever or genital sores.  Past Medical History:  Diagnosis Date  . Chronic prescription opiate use   . Sickle cell anemia (HCC)   . Sickle cell disease (HCC)     Past Surgical History:  Procedure Laterality Date  . CHOLECYSTECTOMY      Family History  Family history unknown: Yes    Social History   Socioeconomic History  . Marital status: Single    Spouse name: Not on file  . Number of children: Not on file  . Years of education: Not on file  . Highest education level: Not on file  Occupational History  . Not on file  Social Needs  . Financial resource strain: Not on file  . Food insecurity    Worry: Not on file    Inability: Not on file  . Transportation needs    Medical: Not on file    Non-medical: Not on file   Tobacco Use  . Smoking status: Former Games developer  . Smokeless tobacco: Never Used  Substance and Sexual Activity  . Alcohol use: Yes    Comment: occ  . Drug use: No  . Sexual activity: Not on file  Lifestyle  . Physical activity    Days per week: Not on file    Minutes per session: Not on file  . Stress: Not on file  Relationships  . Social Musician on phone: Not on file    Gets together: Not on file    Attends religious service: Not on file    Active member of club or organization: Not on file    Attends meetings of clubs or organizations: Not on file    Relationship status: Not on file  . Intimate partner violence    Fear of current or ex partner: Not on file    Emotionally abused: Not on file    Physically abused: Not on file    Forced sexual activity: Not on file  Other Topics Concern  . Not on file  Social History Narrative  . Not on file    Outpatient Medications Prior to Visit  Medication Sig Dispense Refill  . ibuprofen (ADVIL,MOTRIN) 800 MG tablet Take 1 tablet (800 mg total) by mouth every 8 (eight) hours as needed. (Patient not taking: Reported on 03/06/2019) 30 tablet 2   No facility-administered medications prior  to visit.     Allergies  Allergen Reactions  . Shellfish Allergy Anaphylaxis    ROS Review of Systems  Constitutional: Negative for activity change and appetite change.  HENT: Negative.  Negative for congestion and dental problem.   Respiratory: Negative.  Negative for apnea and chest tightness.   Cardiovascular: Negative for chest pain and leg swelling.  Gastrointestinal: Negative for abdominal distention and abdominal pain.  Genitourinary: Negative.  Negative for discharge, genital sores, hematuria, penile pain and scrotal swelling.  Musculoskeletal: Negative.   Skin: Negative.   Psychiatric/Behavioral: Negative.       Objective:    Physical Exam  Constitutional: He is oriented to person, place, and time. He appears  well-developed and well-nourished.  Eyes: Pupils are equal, round, and reactive to light.  Cardiovascular: Normal rate, regular rhythm and normal heart sounds.  Pulmonary/Chest: Effort normal and breath sounds normal.  Abdominal: Soft. Bowel sounds are normal.  Musculoskeletal: Normal range of motion.  Neurological: He is alert and oriented to person, place, and time.  Skin: Skin is warm and dry.  Psychiatric: He has a normal mood and affect. His behavior is normal. Judgment and thought content normal.    BP (!) 115/53 (BP Location: Left Arm, Patient Position: Sitting, Cuff Size: Normal)   Pulse 86   Temp 98.2 F (36.8 C) (Oral)   Resp 14   Ht 5\' 8"  (1.727 m)   Wt 154 lb (69.9 kg)   SpO2 100%   BMI 23.42 kg/m  Wt Readings from Last 3 Encounters:  03/06/19 154 lb (69.9 kg)  02/16/19 153 lb (69.4 kg)  08/21/18 148 lb (67.1 kg)     No results found for: TSH Lab Results  Component Value Date   WBC 6.8 02/16/2019   HGB 12.5 (L) 02/16/2019   HCT 34.3 (L) 02/16/2019   MCV 78.3 (L) 02/16/2019   PLT 232 02/16/2019   Lab Results  Component Value Date   NA 137 02/16/2019   K 4.1 02/16/2019   CO2 24 02/16/2019   GLUCOSE 118 (H) 02/16/2019   BUN 7 02/16/2019   CREATININE 0.90 02/16/2019   BILITOT 1.7 (H) 08/16/2018   ALKPHOS 203 (H) 08/16/2018   AST 47 (H) 08/16/2018   ALT 22 08/16/2018   PROT 6.7 08/16/2018   ALBUMIN 3.5 08/16/2018   CALCIUM 9.1 02/16/2019   ANIONGAP 10 02/16/2019   No results found for: CHOL No results found for: HDL No results found for: LDLCALC No results found for: TRIG No results found for: CHOLHDL No results found for: HGBA1C    Assessment & Plan:   Problem List Items Addressed This Visit      Other   Sickle cell pain crisis ([place]) - Primary   Relevant Orders   Urinalysis Dipstick   CBC with Differential    Other Visit Diagnoses    Possible exposure to STD       Relevant Orders   RPR   HIV antibody (with reflex)   GC/Chlamydia  Probe Amp([facility])   Hepatitis panel, acute   HSV(herpes simplex vrs) 1+2 ab-IgG     Possible exposure to STD Discussed the importance of using barrier protection with sexual intercourse at length - RPR - HIV antibody (with reflex) - GC/Chlamydia Probe Amp([facility]) - Hepatitis panel, acute - HSV(herpes simplex vrs) 1+2 ab-IgG  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   Pulmonary evaluation - Patient denies  severe recurrent wheezes, shortness of breath with exercise, or persistent cough. If these symptoms develop, pulmonary function tests with spirometry will be ordered, and if abnormal, plan on referral to Pulmonology for further evaluation.  Cardiac - Routine screening for pulmonary hypertension is not recommended.  Eye - Sent a referral to ophthalmology.   Immunization status - Patient refused influenza vaccination   - Urinalysis Dipstick - CBC with Differential - Ambulatory referral to Ophthalmology - Comprehensive metabolic panel - folic acid (FOLVITE) 1 MG tablet; Take 1 tablet (1 mg total) by mouth daily.  Dispense: 90 tablet; Refill: 1    Follow-up: Return in about 6 months (around 09/03/2019) for sickle cell anemia.    Julianne Handler, FNP

## 2019-03-07 LAB — COMPREHENSIVE METABOLIC PANEL
ALT: 11 IU/L (ref 0–44)
AST: 21 IU/L (ref 0–40)
Albumin/Globulin Ratio: 1.9 (ref 1.2–2.2)
Albumin: 4.4 g/dL (ref 4.1–5.2)
Alkaline Phosphatase: 107 IU/L (ref 39–117)
BUN/Creatinine Ratio: 6 — ABNORMAL LOW (ref 9–20)
BUN: 5 mg/dL — ABNORMAL LOW (ref 6–20)
Bilirubin Total: 1.2 mg/dL (ref 0.0–1.2)
CO2: 22 mmol/L (ref 20–29)
Calcium: 9 mg/dL (ref 8.7–10.2)
Chloride: 106 mmol/L (ref 96–106)
Creatinine, Ser: 0.87 mg/dL (ref 0.76–1.27)
GFR calc Af Amer: 134 mL/min/{1.73_m2} (ref >59)
GFR calc non Af Amer: 116 mL/min/{1.73_m2} (ref >59)
Globulin, Total: 2.3 g/dL (ref 1.5–4.5)
Glucose: 114 mg/dL — ABNORMAL HIGH (ref 65–99)
Potassium: 3.6 mmol/L (ref 3.5–5.2)
Sodium: 142 mmol/L (ref 134–144)
Total Protein: 6.7 g/dL (ref 6.0–8.5)

## 2019-03-07 LAB — CBC WITH DIFFERENTIAL/PLATELET
Basophils Absolute: 0 10*3/uL (ref 0.0–0.2)
Basos: 0 %
EOS (ABSOLUTE): 0 10*3/uL (ref 0.0–0.4)
Eos: 0 %
Hematocrit: 34.4 % — ABNORMAL LOW (ref 37.5–51.0)
Hemoglobin: 11.6 g/dL — ABNORMAL LOW (ref 13.0–17.7)
Immature Grans (Abs): 0 10*3/uL (ref 0.0–0.1)
Immature Granulocytes: 0 %
Lymphocytes Absolute: 2.4 10*3/uL (ref 0.7–3.1)
Lymphs: 35 %
MCH: 28.1 pg (ref 26.6–33.0)
MCHC: 33.7 g/dL (ref 31.5–35.7)
MCV: 83 fL (ref 79–97)
Monocytes Absolute: 0.5 10*3/uL (ref 0.1–0.9)
Monocytes: 7 %
NRBC: 1 % — ABNORMAL HIGH (ref 0–0)
Neutrophils Absolute: 3.9 10*3/uL (ref 1.4–7.0)
Neutrophils: 58 %
Platelets: 184 10*3/uL (ref 150–450)
RBC: 4.13 x10E6/uL — ABNORMAL LOW (ref 4.14–5.80)
RDW: 14.6 % (ref 11.6–15.4)
WBC: 6.8 10*3/uL (ref 3.4–10.8)

## 2019-03-07 LAB — HEPATITIS PANEL, ACUTE
Hep A IgM: NEGATIVE
Hep B C IgM: NEGATIVE
Hep C Virus Ab: <0.1 s/co ratio (ref 0.0–0.9)
Hepatitis B Surface Ag: NEGATIVE

## 2019-03-07 LAB — RPR: RPR Ser Ql: NONREACTIVE

## 2019-03-07 LAB — HSV(HERPES SIMPLEX VRS) I + II AB-IGG
HSV 1 Glycoprotein G Ab, IgG: <0.91 index (ref 0.00–0.90)
HSV 2 IgG, Type Spec: <0.91 index (ref 0.00–0.90)

## 2019-03-07 LAB — HIV ANTIBODY (ROUTINE TESTING W REFLEX): HIV Screen 4th Generation wRfx: NONREACTIVE

## 2019-03-07 IMAGING — CR DG CHEST 2V
2 series · 2 of 2 positions shown · non-contrast
Comparison: 08/04/2016

CLINICAL DATA: Shortness of breath. Back pain. Sickle cell crisis.

EXAM:
CHEST  2 VIEW

[w chest pa]
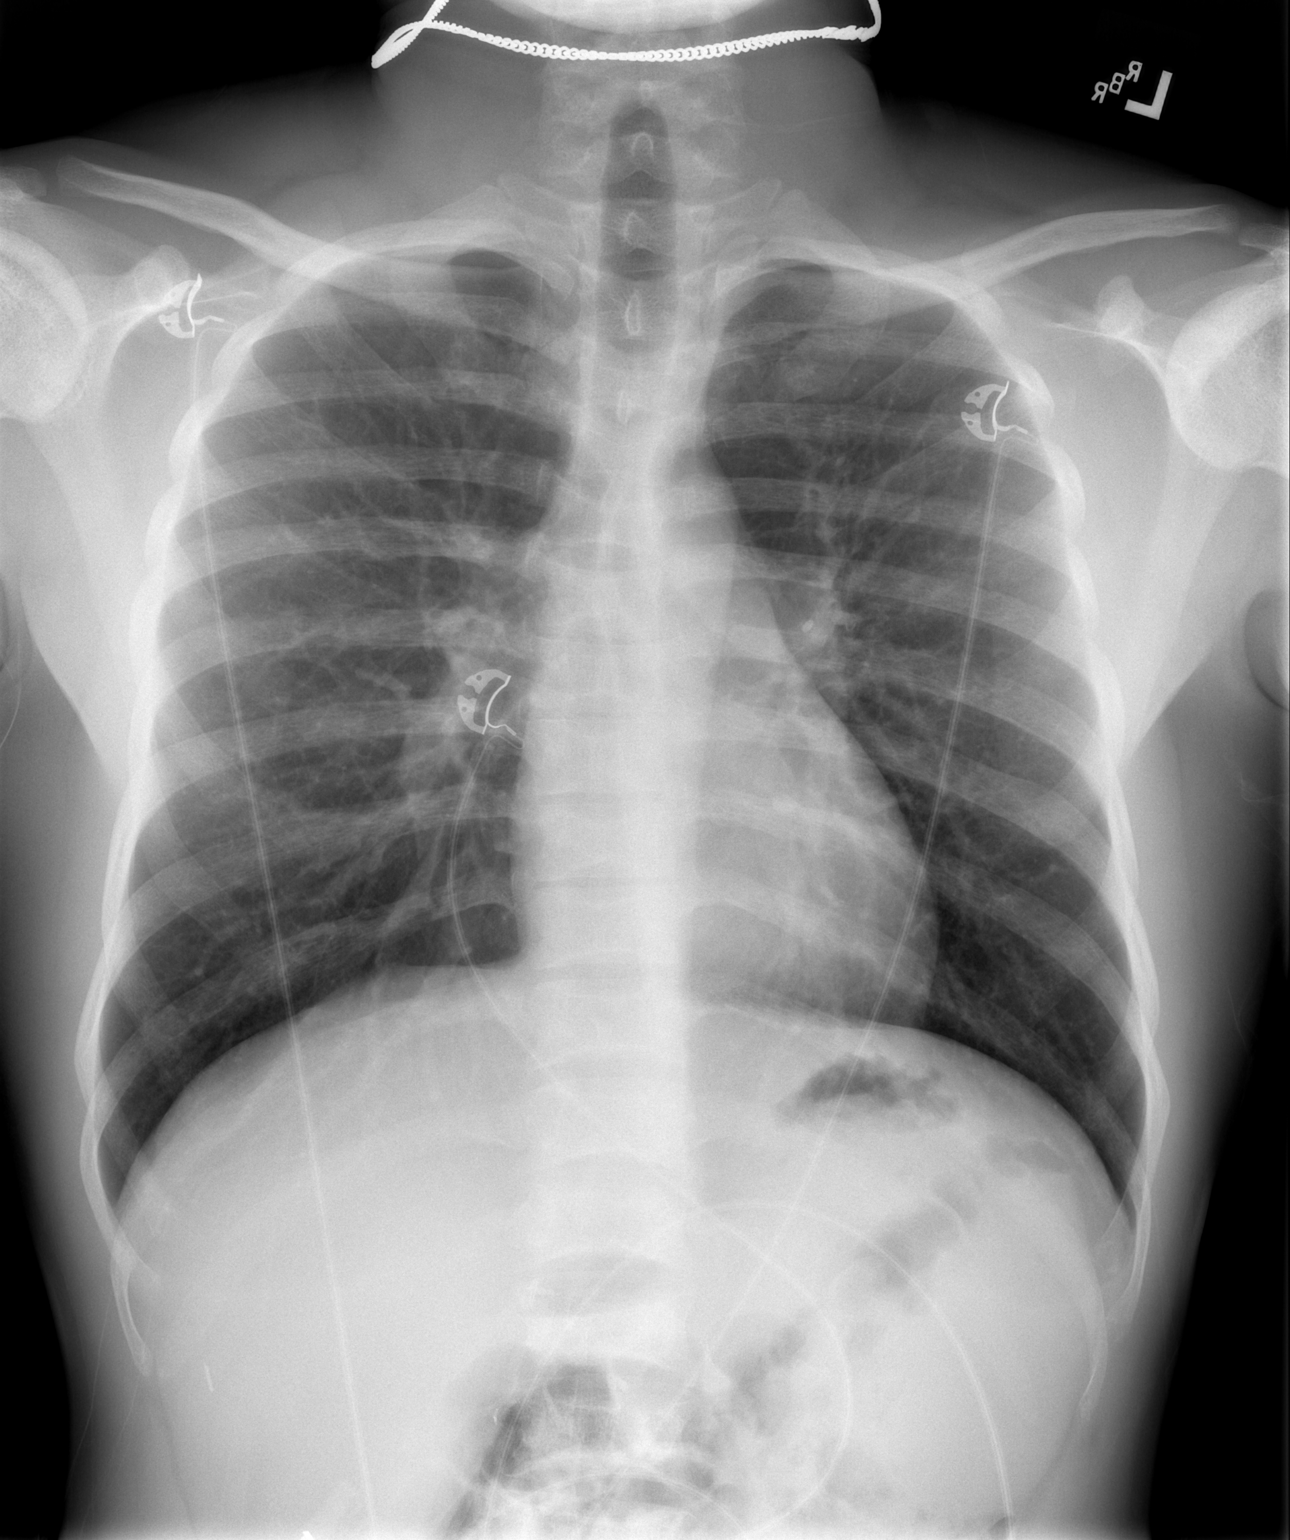

[w chest lat]
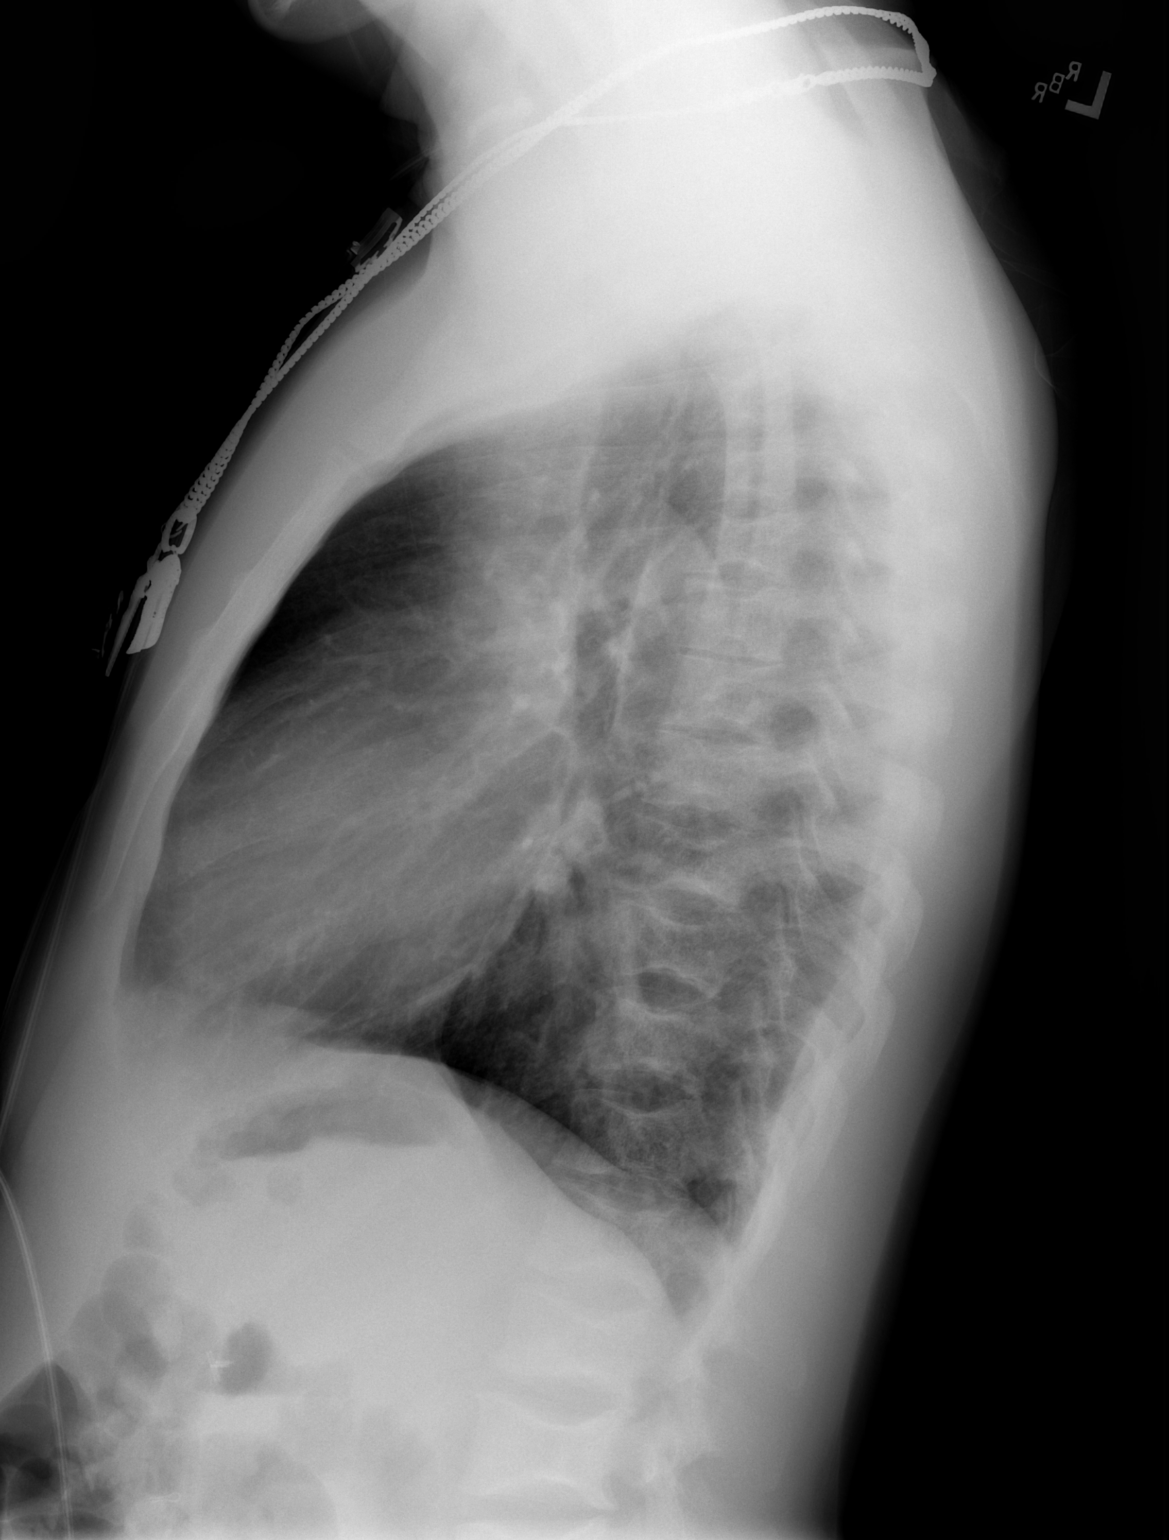

[2 of 2 positions shown; findings below may reference images not displayed]

FINDINGS: The heart size and mediastinal contours are within normal limits.
Both lungs are clear.

Sclerotic bones of multiple vertebral deformities are consistent
with the patient's history of sickle cell disease.
IMPRESSION: No acute abnormalities.  Osseous changes of sickle cell disease.

## 2019-03-09 ENCOUNTER — Telehealth: Payer: Self-pay

## 2019-03-09 NOTE — Telephone Encounter (Signed)
Called and spoke with patient, advised that blood test were all negative and we are still waiting on gonorrhea and chlamydia and will advised him on these when they arrive. Thanks!

## 2019-03-09 NOTE — Telephone Encounter (Signed)
-----   Message from Dorena Dew, [place] sent at 03/08/2019  3:44 PM EDT ----- Regarding: lab results Please inform patient that tests for HIV, syphilis, herpes simplex and hepatitis are all negative.  He still has pending test that include gonorrhea and chlamydia.  We will follow-up by phone with those test results.  Remind patient of the importance of using barrier protection with sexual intercourse.   [name]  APRN, MSN, FNP-C [facility] [address] [place], [place] [address] [phone]

## 2019-03-10 LAB — GC/CHLAMYDIA PROBE AMP
Chlamydia trachomatis, NAA: NEGATIVE
Neisseria Gonorrhoeae by PCR: NEGATIVE

## 2019-03-20 IMAGING — DX DG CHEST 2V
2 series · 2 of 2 positions shown · non-contrast
Comparison: Chest radiograph dated 05/24/2017

CLINICAL DATA: 26-year-old male with chest pain. History of sickle
cell.

EXAM:
CHEST  2 VIEW

[chest pa]
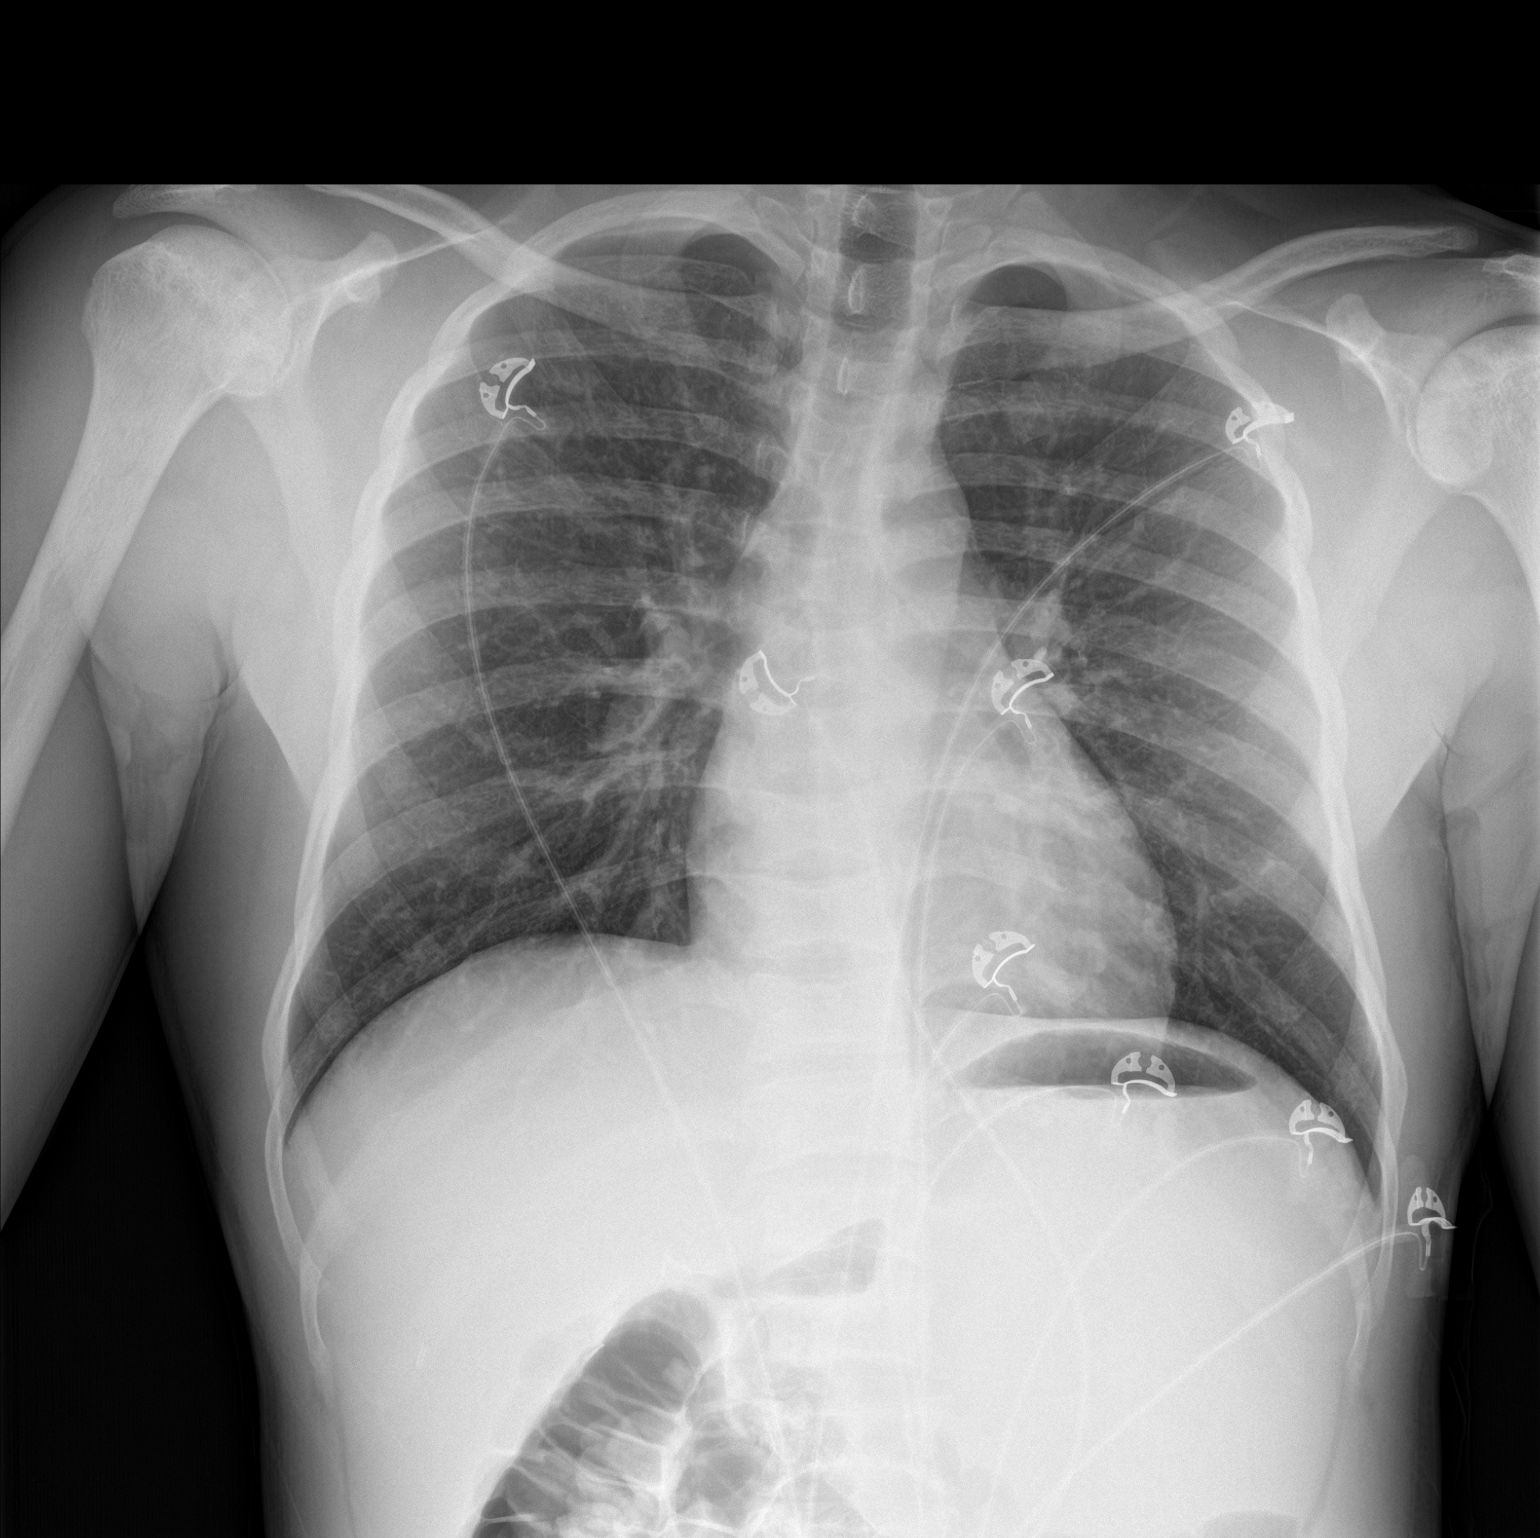

[chest lat]
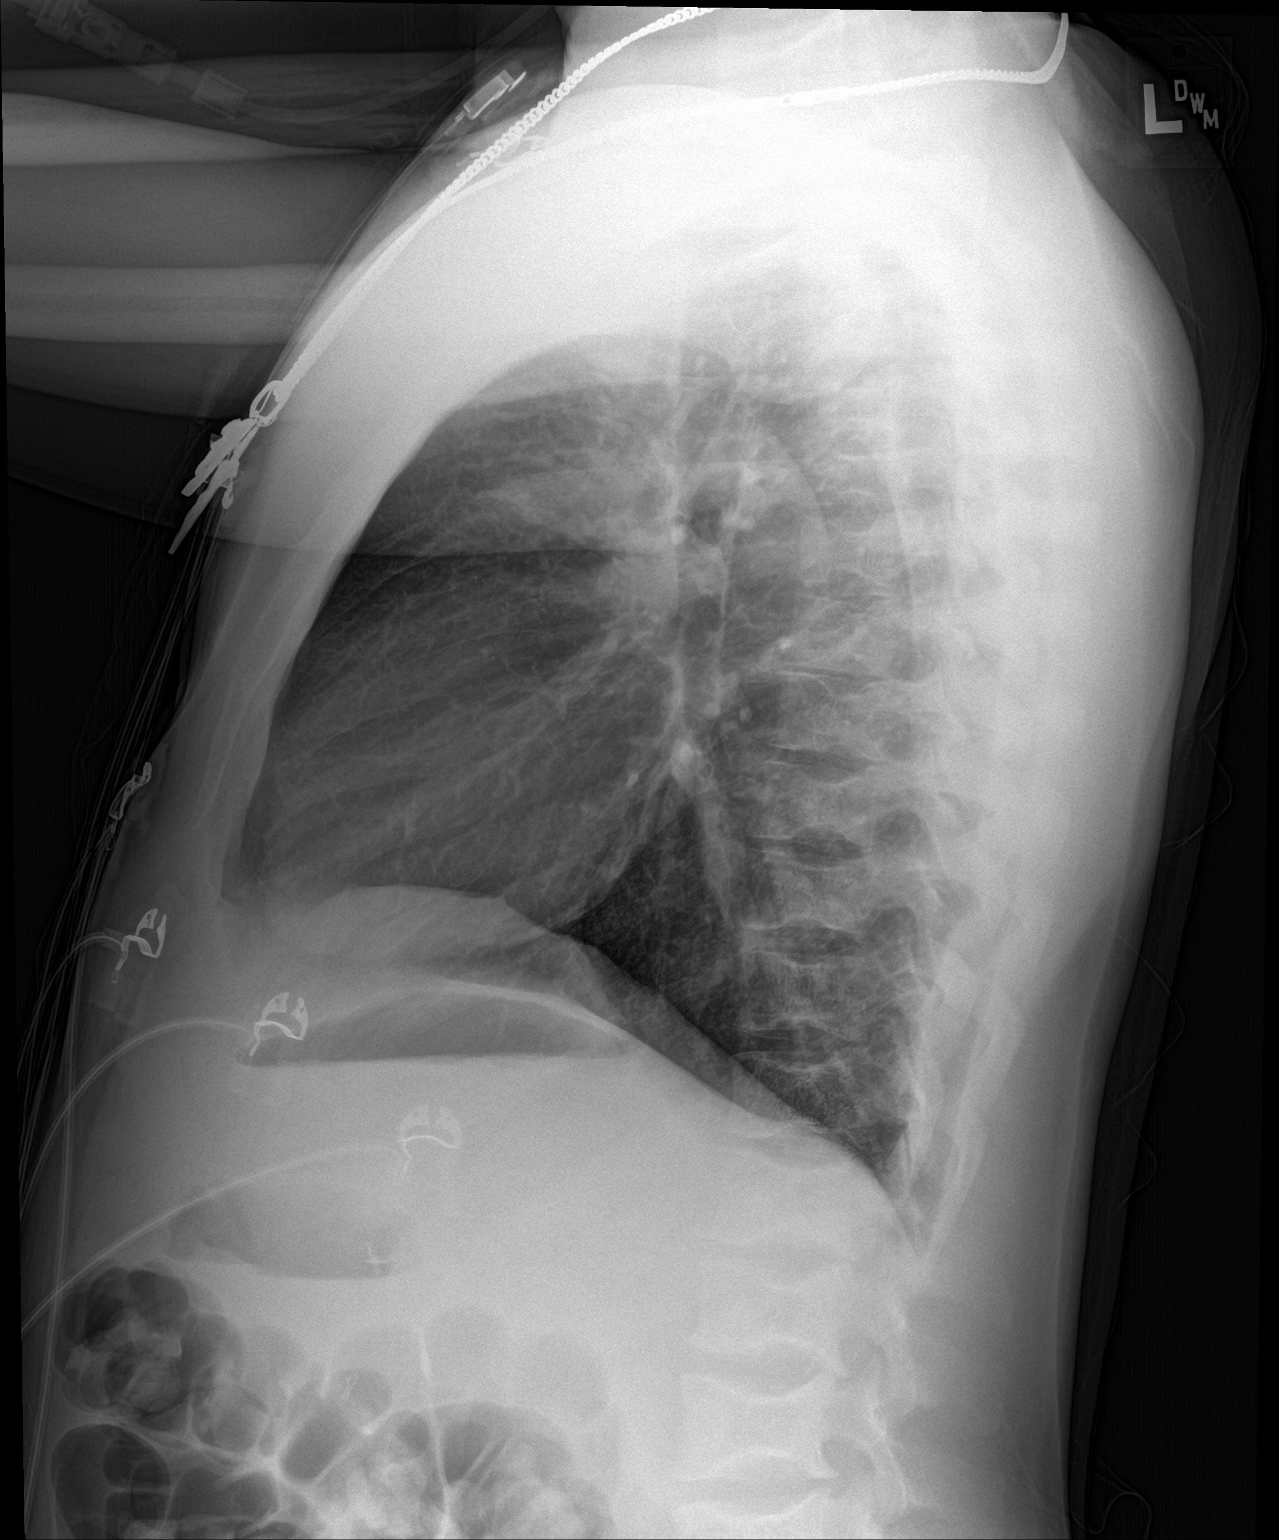

[2 of 2 positions shown; findings below may reference images not displayed]

FINDINGS: The lungs are clear. There is no pleural effusion or pneumothorax.
The cardiac silhouette is within normal limits. Sclerotic changes of
the humeral head as far as central depression of the vertebral body
endplates. No acute osseous pathology.
IMPRESSION: 1. No acute cardiopulmonary process.
2. Osseous stigmata of sickle cell disease.

## 2019-03-25 IMAGING — CT CT ANGIO CHEST
2 of 10 series · 18 of 36 positions shown · IV contrast (iopamidol)
Comparison: 06/11/2017

CLINICAL DATA: History of sickle cell disease with chest pain for
several days, initial encounter

EXAM:
CT ANGIOGRAPHY CHEST WITH CONTRAST
TECHNIQUE: Multidetector CT imaging of the chest was performed using the
standard protocol during bolus administration of intravenous
contrast. Multiplanar CT image reconstructions and MIPs were
obtained to evaluate the vascular anatomy.
CONTRAST:  100mL [id number] IOPAMIDOL ([id number]) INJECTION 76%

[Series 8: pe thins · axial · 0.58mm/px · z∈[-251,-43]mm · 17 of 236 slices shown]
[im 14/236  lung]
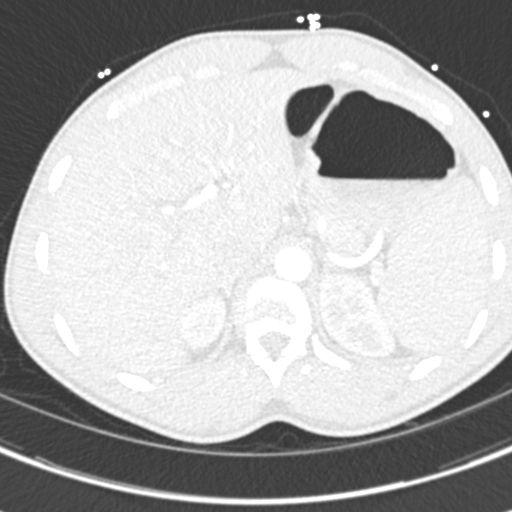
[im 27/236  mediastinal]
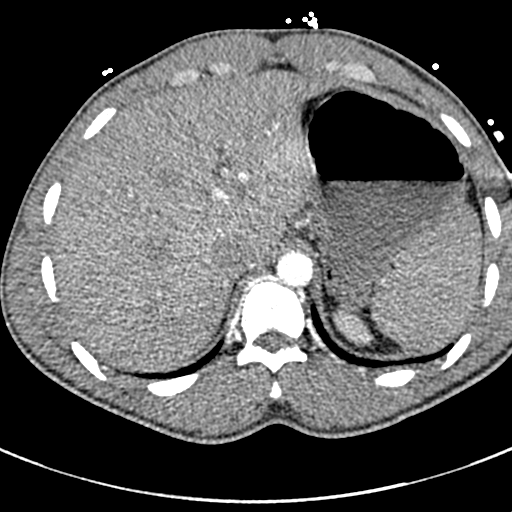
[im 40/236  lung]
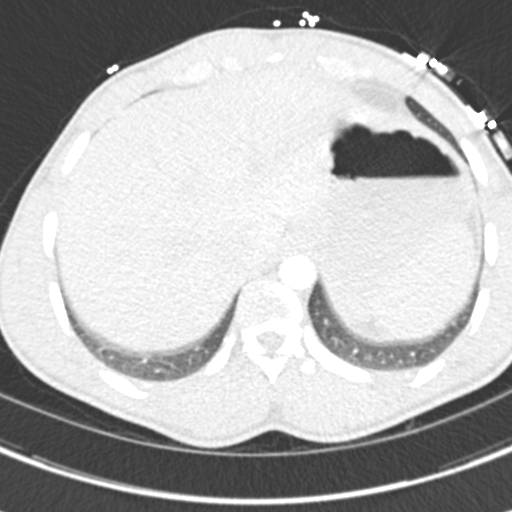
[im 53/236  mediastinal]
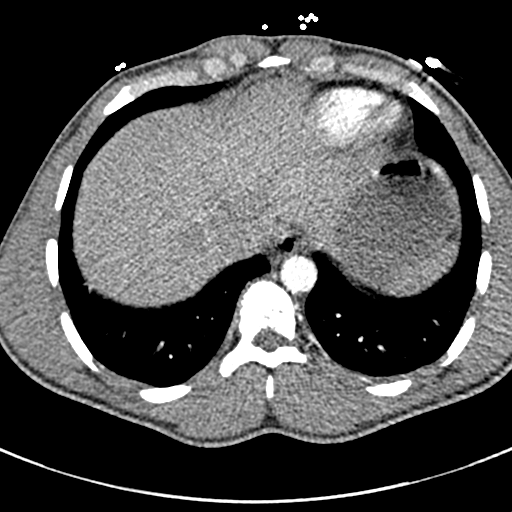
[im 66/236  lung]
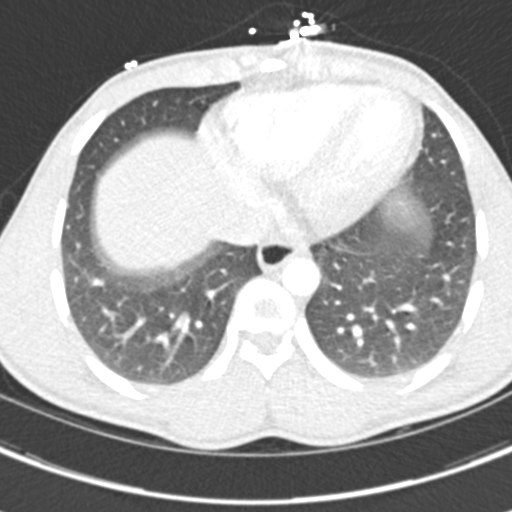
[im 79/236  mediastinal]
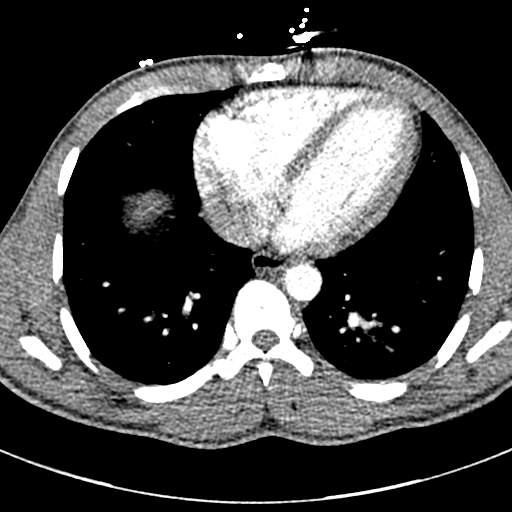
[im 92/236  lung]
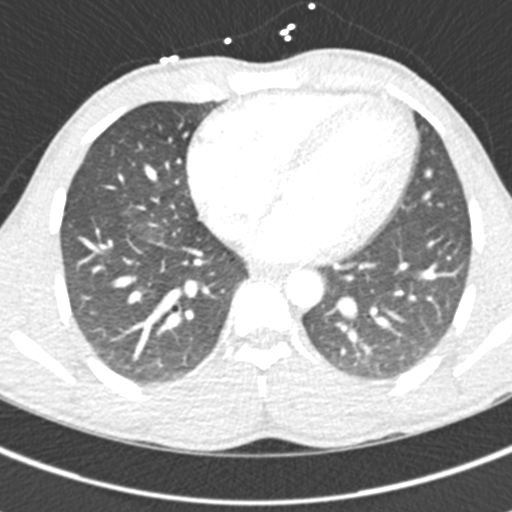
[im 105/236  mediastinal]
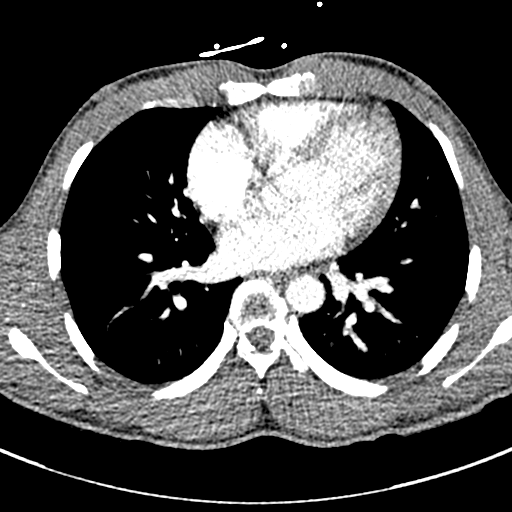
[im 118/236  lung]
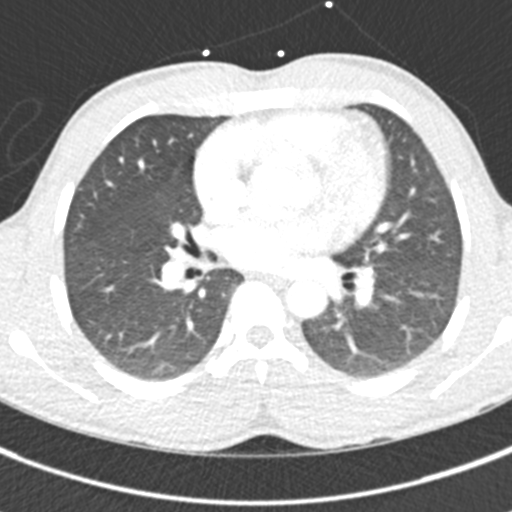
[im 131/236  mediastinal]
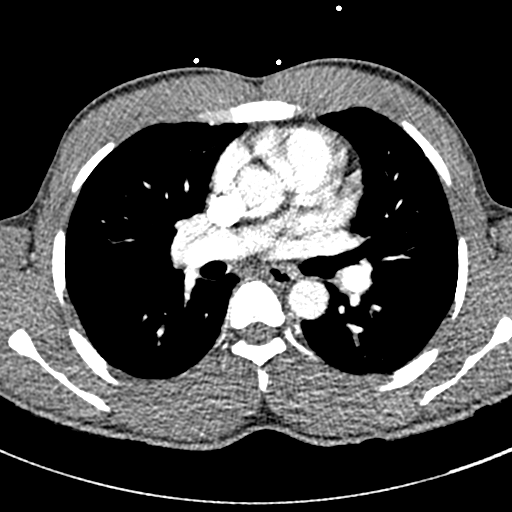
[im 144/236  lung]
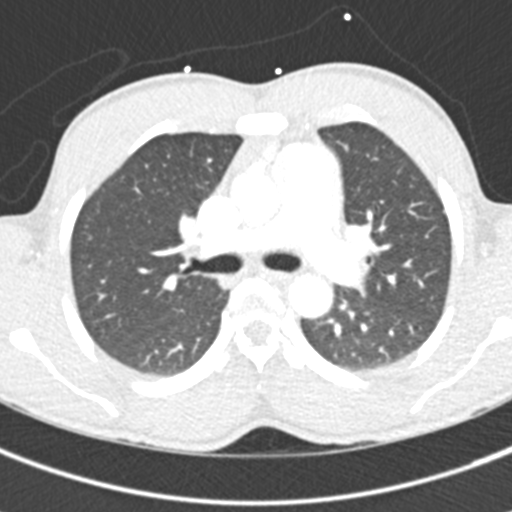
[im 157/236  mediastinal]
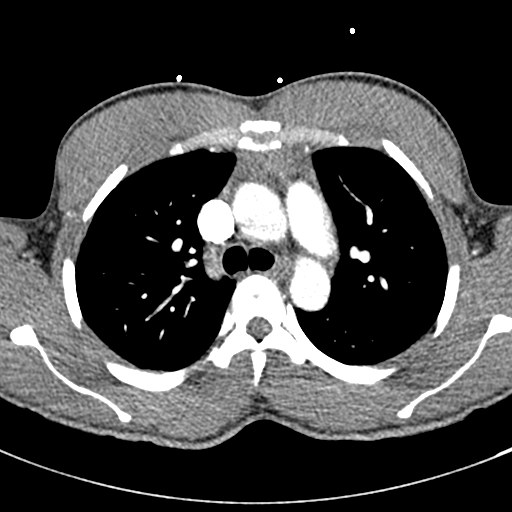
[im 170/236  lung]
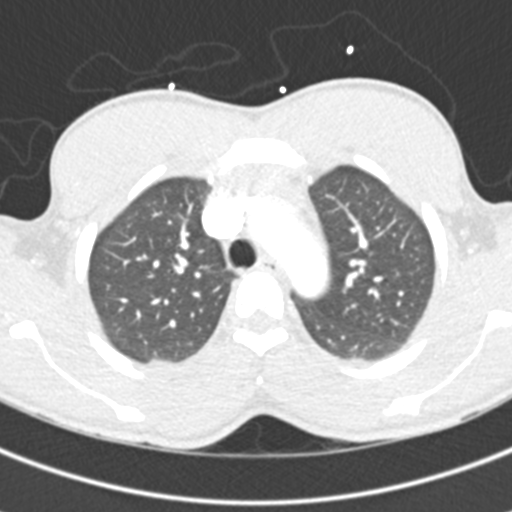
[im 183/236  mediastinal]
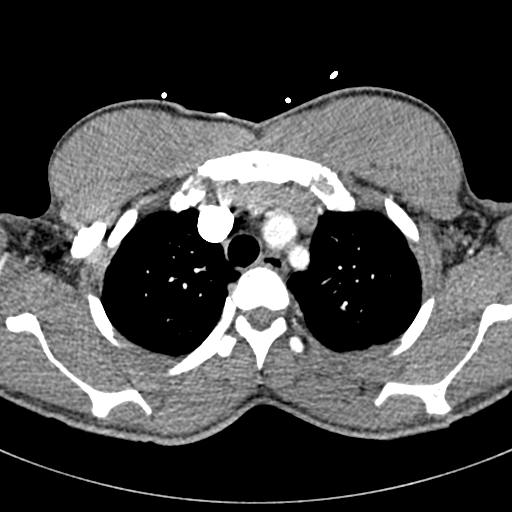
[im 196/236  lung]
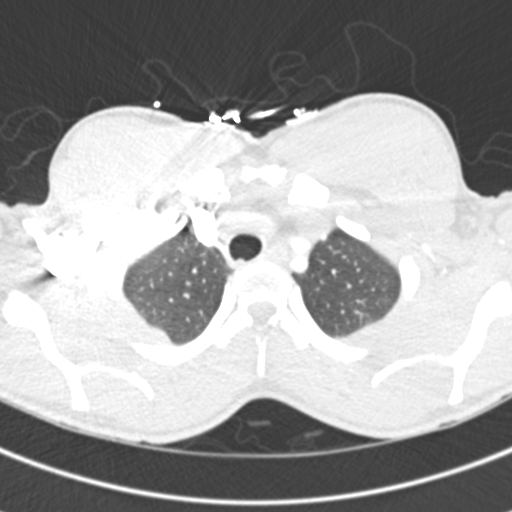
[im 209/236  mediastinal]
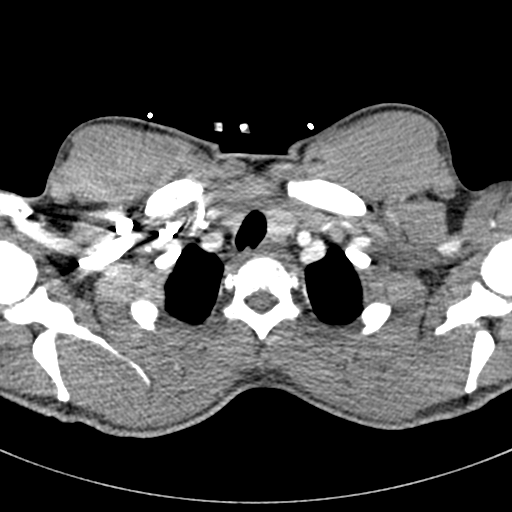
[im 222/236  lung]
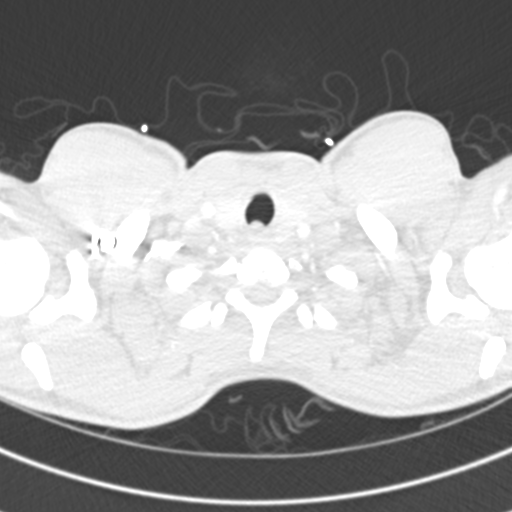

[Series 9: pe coronal mpr · coronal · 0.52mm/px · 1 of 113 slices shown]
[im 57/113  mediastinal]
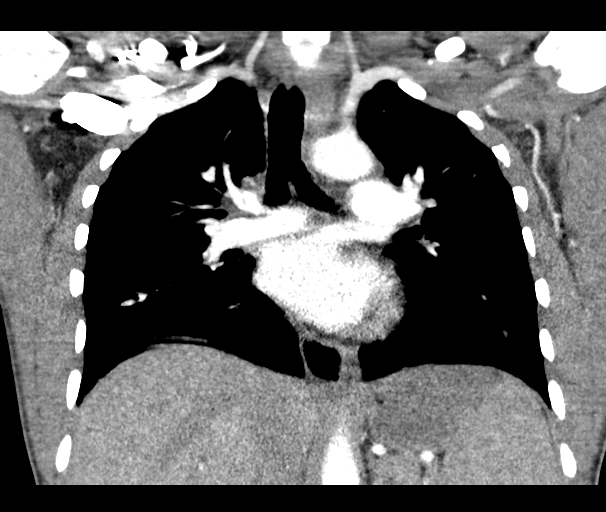

[18 of 36 positions shown; findings below may reference images not displayed]

FINDINGS: Cardiovascular: Thoracic aorta is within normal limits. Pulmonary
artery demonstrates a normal branching pattern. No intraluminal
filling defect to suggest pulmonary embolism is identified. No
significant cardiac enlargement is seen. No coronary calcifications
are noted.

Mediastinum/Nodes: Thoracic inlet is within normal limits. No hilar
or mediastinal adenopathy is noted. Mild residual thymus tissue is
seen. The esophagus is within normal limits.

Lungs/Pleura: Lungs are clear. No pleural effusion or pneumothorax.

Upper Abdomen: No acute abnormality.

Musculoskeletal: Multiple deformities are noted in the thoracic
spine consistent with the given clinical history of sickle cell
disease. Diffuse mottled density is noted throughout the bony
structures consistent with the underlying clinical history.

Review of the MIP images confirms the above findings.
IMPRESSION: No evidence of pulmonary emboli.

No acute abnormality is noted.

Bony changes consistent with the given clinical history of sickle
cell disease.

## 2019-04-20 ENCOUNTER — Telehealth: Payer: Self-pay | Admitting: Family Medicine

## 2019-04-20 ENCOUNTER — Other Ambulatory Visit: Payer: Self-pay | Admitting: Family Medicine

## 2019-04-20 MED ORDER — OXYCODONE-ACETAMINOPHEN 5-325 MG PO TABS: 1.0000 | ORAL_TABLET | ORAL | 0 refills | Status: DC | PRN

## 2019-04-20 NOTE — Progress Notes (Signed)
   Meds ordered this encounter  Medications  . oxyCODONE-acetaminophen (PERCOCET) 5-325 MG tablet    Sig: Take 1 tablet by mouth every 4 (four) hours as needed for severe pain.    Dispense:  30 tablet    Refill:  0    Order Specific Question:   Supervising Provider    Answer:   Tresa Garter [[id number]]    [name]  APRN, MSN, FNP-C [facility] [address] [place], [place] [address] [phone]

## 2019-04-25 ENCOUNTER — Other Ambulatory Visit: Payer: Self-pay | Admitting: Family Medicine

## 2019-04-25 NOTE — Telephone Encounter (Signed)
done

## 2019-07-02 ENCOUNTER — Encounter: Payer: Self-pay | Admitting: Nurse Practitioner

## 2019-07-02 ENCOUNTER — Ambulatory Visit (INDEPENDENT_AMBULATORY_CARE_PROVIDER_SITE_OTHER): Payer: Medicaid Other | Admitting: Nurse Practitioner

## 2019-07-02 ENCOUNTER — Other Ambulatory Visit: Payer: Self-pay

## 2019-07-02 VITALS — BP 113/59 | HR 87 | Temp 98.5°F | Resp 16 | Ht 68.0 in | Wt 152.0 lb

## 2019-07-02 DIAGNOSIS — D5700 Hb-SS disease with crisis, unspecified: Secondary | ICD-10-CM | POA: Diagnosis not present

## 2019-07-02 LAB — POCT URINALYSIS DIPSTICK
Bilirubin, UA: NEGATIVE
Blood, UA: NEGATIVE
Glucose, UA: NEGATIVE
Ketones, UA: NEGATIVE
Leukocytes, UA: NEGATIVE
Nitrite, UA: NEGATIVE
Protein, UA: NEGATIVE
Spec Grav, UA: 1.015 (ref 1.010–1.025)
Urobilinogen, UA: 2 E.U./dL — AB
pH, UA: 7 (ref 5.0–8.0)

## 2019-07-02 MED ORDER — IBUPROFEN 800 MG PO TABS: 800.0000 mg | ORAL_TABLET | Freq: Three times a day (TID) | ORAL | 2 refills | Status: DC | PRN

## 2019-07-02 MED ORDER — OXYCODONE-ACETAMINOPHEN 5-325 MG PO TABS: 1.0000 | ORAL_TABLET | ORAL | 0 refills | Status: DC | PRN

## 2019-07-02 NOTE — Progress Notes (Signed)
Established Patient Office Visit  Subjective:  Patient ID: William Jennings, male    DOB: 07/16/1990  Age: 29 y.o. MRN: [id number]  CC:  Chief Complaint  Patient presents with  . Sickle Cell Anemia  . Medication Problem    pain medication not completly helping crisis     HPI William Jennings presents for follow up of recent Sickle Cell Crisis. He rates his pain a 4/10. This has resolved from a 9/10. The pain was in both legs. He crisis generally comes every 2 months.  He feels like it was weather related. He is wanting to know if he can increase the current medication. He is using [employer] OTC. He admits that this is not helping. He does not feel like the Oxycodone 5/325mg  is as effective as the hydrocodone 5/325mg . He admits that he had been taking the Oxycodone one Q6 hours instead of the recommended q4. He continues to take his folic acid and is staying hydrated. He denies any headache, visual changes, shortness of breath or chest pain. He is having some constipation. He feels like the Oxycodone made his constipation worse. He was having to force himself "to go".  He admits that he manages his pain crisis at home and occasionally goes to the emergency room.  He states he has gotten the intake hospital was still available..   Past Medical History:  Diagnosis Date  . Chronic prescription opiate use   . Sickle cell anemia (HCC)   . Sickle cell disease ([place])     Past Surgical History:  Procedure Laterality Date  . CHOLECYSTECTOMY      Family History  Family history unknown: Yes    Social History   Socioeconomic History  . Marital status: Single    Spouse name: Not on file  . Number of children: Not on file  . Years of education: Not on file  . Highest education level: Not on file  Occupational History  . Not on file  Tobacco Use  . Smoking status: Former Research scientist (life sciences)  . Smokeless tobacco: Never Used  Substance and Sexual Activity  . Alcohol use: Yes    Comment: occ  . Drug use: No   . Sexual activity: Not on file  Other Topics Concern  . Not on file  Social History Narrative  . Not on file   Social Determinants of Health   Financial Resource Strain:   . Difficulty of Paying Living Expenses: Not on file  Food Insecurity:   . Worried About Charity fundraiser in the Last Year: Not on file  . Ran Out of Food in the Last Year: Not on file  Transportation Needs:   . Lack of Transportation (Medical): Not on file  . Lack of Transportation (Non-Medical): Not on file  Physical Activity:   . Days of Exercise per Week: Not on file  . Minutes of Exercise per Session: Not on file  Stress:   . Feeling of Stress : Not on file  Social Connections:   . Frequency of Communication with Friends and Family: Not on file  . Frequency of Social Gatherings with Friends and Family: Not on file  . Attends Religious Services: Not on file  . Active Member of Clubs or Organizations: Not on file  . Attends Archivist Meetings: Not on file  . Marital Status: Not on file  Intimate Partner Violence:   . Fear of Current or Ex-Partner: Not on file  . Emotionally Abused: Not on  file  . Physically Abused: Not on file  . Sexually Abused: Not on file    Outpatient Medications Prior to Visit  Medication Sig Dispense Refill  . folic acid (FOLVITE) 1 MG tablet Take 1 tablet (1 mg total) by mouth daily. 90 tablet 1  . ibuprofen (ADVIL,MOTRIN) 800 MG tablet Take 1 tablet (800 mg total) by mouth every 8 (eight) hours as needed. 30 tablet 2  . oxyCODONE-acetaminophen (PERCOCET) 5-325 MG tablet Take 1 tablet by mouth every 4 (four) hours as needed for severe pain. 30 tablet 0   No facility-administered medications prior to visit.    Allergies  Allergen Reactions  . Shellfish Allergy Anaphylaxis    ROS Review of Systems  Constitutional: Negative.   HENT: Negative.   Eyes: Negative.   Respiratory: Negative.   Cardiovascular: Negative.   Gastrointestinal: Positive for  constipation.  Endocrine: Negative.   Genitourinary: Negative.   Musculoskeletal:       Generalized leg pain  Skin: Negative.   Allergic/Immunologic: Negative.   Neurological: Negative.   Hematological: Negative.   Psychiatric/Behavioral: Negative.       Objective:    Physical Exam  Constitutional: He is oriented to person, place, and time. He appears well-developed and well-nourished.  HENT:  Head: Normocephalic.  Eyes: Pupils are equal, round, and reactive to light.  Cardiovascular: Normal rate, regular rhythm and normal heart sounds.  Pulmonary/Chest: Effort normal and breath sounds normal.  Abdominal: Soft.  Hypoactive mild tenderness  Musculoskeletal:        General: Normal range of motion.     Cervical back: Normal range of motion.  Neurological: He is alert and oriented to person, place, and time.  Skin: Skin is warm and dry.  Psychiatric: He has a normal mood and affect. His behavior is normal. Judgment and thought content normal.    BP (!) 113/59 (BP Location: Right Arm, Patient Position: Sitting, Cuff Size: Normal)   Pulse 87   Temp 98.5 F (36.9 C) (Oral)   Resp 16   Ht 5\' 8"  (1.727 m)   Wt 152 lb (68.9 kg)   SpO2 100%   BMI 23.11 kg/m  Wt Readings from Last 3 Encounters:  07/02/19 152 lb (68.9 kg)  03/06/19 154 lb (69.9 kg)  02/16/19 153 lb (69.4 kg)     There are no preventive care reminders to display for this patient.  There are no preventive care reminders to display for this patient.  No results found for: TSH Lab Results  Component Value Date   WBC 6.8 03/06/2019   HGB 11.6 (L) 03/06/2019   HCT 34.4 (L) 03/06/2019   MCV 83 03/06/2019   PLT 184 03/06/2019   Lab Results  Component Value Date   NA 142 03/06/2019   K 3.6 03/06/2019   CO2 22 03/06/2019   GLUCOSE 114 (H) 03/06/2019   BUN 5 (L) 03/06/2019   CREATININE 0.87 03/06/2019   BILITOT 1.2 03/06/2019   ALKPHOS 107 03/06/2019   AST 21 03/06/2019   ALT 11 03/06/2019   PROT 6.7  03/06/2019   ALBUMIN 4.4 03/06/2019   CALCIUM 9.0 03/06/2019   ANIONGAP 10 02/16/2019   No results found for: CHOL No results found for: HDL No results found for: LDLCALC No results found for: TRIG No results found for: CHOLHDL No results found for: 04/18/2019    Assessment & Plan:   Problem List Items Addressed This Visit      Medium   Sickle cell pain crisis (  HCC) - Primary   Relevant Orders   Urinalysis Dipstick  Instructed patient to consider the day hospital for pain crisis. Encourage patient to use oxycodone every 4 hours as directed versus every 6    Meds ordered this encounter  Medications  . ibuprofen (ADVIL) 800 MG tablet    Sig: Take 1 tablet (800 mg total) by mouth every 8 (eight) hours as needed.    Dispense:  30 tablet    Refill:  2    Order Specific Question:   Supervising Provider    Answer:   Quentin Angst [id number]  . oxyCODONE-acetaminophen (PERCOCET) 5-325 MG tablet    Sig: Take 1 tablet by mouth every 4 (four) hours as needed for severe pain.    Dispense:  30 tablet    Refill:  0    Order Specific Question:   Supervising Provider    Answer:   Quentin Angst [[id number]]    Follow-up: Return in about 2 months (around 08/30/2019).    Barbette Merino, NP

## 2019-07-02 NOTE — Patient Instructions (Signed)
Sickle Cell Anemia, Adult  Sickle cell anemia is a condition where your red blood cells are shaped like sickles. Red blood cells carry oxygen through the body. Sickle-shaped cells do not live as long as normal red blood cells. They also clump together and block blood from flowing through the blood vessels. This prevents the body from getting enough oxygen. Sickle cell anemia causes organ damage and pain. It also increases the risk of infection. Follow these instructions at home: Medicines  Take over-the-counter and prescription medicines only as told by your doctor.  If you were prescribed an antibiotic medicine, take it as told by your doctor. Do not stop taking the antibiotic even if you start to feel better.  If you develop a fever, do not take medicines to lower the fever right away. Tell your doctor about the fever. Managing pain, stiffness, and swelling  Try these methods to help with pain: ? Use a heating pad. ? Take a warm bath. ? Distract yourself, such as by watching TV. Eating and drinking  Drink enough fluid to keep your pee (urine) clear or pale yellow. Drink more in hot weather and during exercise.  Limit or avoid alcohol.  Eat a healthy diet. Eat plenty of fruits, vegetables, whole grains, and lean protein.  Take vitamins and supplements as told by your doctor. Traveling  When traveling, keep these with you: ? Your medical information. ? The names of your doctors. ? Your medicines.  If you need to take an airplane, talk to your doctor first. Activity  Rest often.  Avoid exercises that make your heart beat much faster, such as jogging. General instructions  Do not use products that have nicotine or tobacco, such as cigarettes and e-cigarettes. If you need help quitting, ask your doctor.  Consider wearing a medical alert bracelet.  Avoid being in high places (high altitudes), such as mountains.  Avoid very hot or cold temperatures.  Avoid places where the  temperature changes a lot.  Keep all follow-up visits as told by your doctor. This is important. Contact a doctor if:  A joint hurts.  Your feet or hands hurt or swell.  You feel tired (fatigued). Get help right away if:  You have symptoms of infection. These include: ? Fever. ? Chills. ? Being very tired. ? Irritability. ? Poor eating. ? Throwing up (vomiting).  You feel dizzy or faint.  You have new stomach pain, especially on the left side.  You have a an erection (priapism) that lasts more than 4 hours.  You have numbness in your arms or legs.  You have a hard time moving your arms or legs.  You have trouble talking.  You have pain that does not go away when you take medicine.  You are short of breath.  You are breathing fast.  You have a long-term cough.  You have pain in your chest.  You have a bad headache.  You have a stiff neck.  Your stomach looks bloated even though you did not eat much.  Your skin is pale.  You suddenly cannot see well. Summary  Sickle cell anemia is a condition where your red blood cells are shaped like sickles.  Follow your doctor's advice on ways to manage pain, food to eat, activities to do, and steps to take for safe travel.  Get medical help right away if you have any signs of infection, such as a fever. This information is not intended to replace advice given to you by   your health care provider. Make sure you discuss any questions you have with your health care provider. Document Revised: 09/22/2018 Document Reviewed: 07/06/2016 Elsevier Patient Education  2020 [employer]  

## 2019-08-30 ENCOUNTER — Ambulatory Visit: Payer: BLUE CROSS/BLUE SHIELD | Admitting: Nurse Practitioner

## 2019-09-04 ENCOUNTER — Ambulatory Visit: Payer: Medicaid Other | Admitting: Family Medicine

## 2019-11-26 ENCOUNTER — Telehealth: Payer: Self-pay | Admitting: Nurse Practitioner

## 2019-11-26 ENCOUNTER — Other Ambulatory Visit: Payer: Self-pay | Admitting: Nurse Practitioner

## 2019-11-26 DIAGNOSIS — D5700 Hb-SS disease with crisis, unspecified: Secondary | ICD-10-CM

## 2019-11-26 MED ORDER — OXYCODONE-ACETAMINOPHEN 5-325 MG PO TABS: 1.0000 | ORAL_TABLET | ORAL | 0 refills | Status: DC | PRN

## 2019-11-26 MED ORDER — IBUPROFEN 800 MG PO TABS: 800.0000 mg | ORAL_TABLET | Freq: Three times a day (TID) | ORAL | 2 refills | Status: DC | PRN

## 2019-11-26 NOTE — Telephone Encounter (Signed)
Medication refill sent to [facility] 

## 2019-11-30 ENCOUNTER — Ambulatory Visit: Payer: Medicaid Other | Admitting: Nurse Practitioner

## 2019-12-08 ENCOUNTER — Other Ambulatory Visit: Payer: Self-pay

## 2019-12-08 ENCOUNTER — Encounter (HOSPITAL_BASED_OUTPATIENT_CLINIC_OR_DEPARTMENT_OTHER): Payer: Self-pay | Admitting: Emergency Medicine

## 2019-12-08 DIAGNOSIS — Z87891 Personal history of nicotine dependence: Secondary | ICD-10-CM | POA: Insufficient documentation

## 2019-12-08 DIAGNOSIS — H43392 Other vitreous opacities, left eye: Secondary | ICD-10-CM | POA: Diagnosis not present

## 2019-12-08 NOTE — ED Triage Notes (Signed)
Pt reports sudden onset of something in his vision, states he feels like he can see his pupil move. Hx sickle cell

## 2019-12-09 ENCOUNTER — Emergency Department (HOSPITAL_BASED_OUTPATIENT_CLINIC_OR_DEPARTMENT_OTHER)
Admission: EM | Admit: 2019-12-09 | Discharge: 2019-12-09 | Disposition: A | Discharge status: Home / Self Care | Payer: Medicaid Other | Attending: Emergency Medicine | Admitting: Emergency Medicine

## 2019-12-09 DIAGNOSIS — H43392 Other vitreous opacities, left eye: Secondary | ICD-10-CM

## 2019-12-09 MED ORDER — KETOROLAC TROMETHAMINE 15 MG/ML IJ SOLN
15.0000 mg | Freq: Once | INTRAMUSCULAR | Status: AC
  Administered 2019-12-09: 15 mg via INTRAMUSCULAR
  Filled 2019-12-09: qty 1

## 2019-12-09 MED ORDER — OXYCODONE-ACETAMINOPHEN 5-325 MG PO TABS
1.0000 | ORAL_TABLET | Freq: Once | ORAL | Status: AC
  Administered 2019-12-09: 1 via ORAL
  Filled 2019-12-09: qty 1

## 2019-12-09 NOTE — ED Notes (Signed)
On call Dr. Cathey Endow consulted. Return call expected within 30 minutes

## 2019-12-09 NOTE — ED Notes (Signed)
Now complaining of sickle cell pain in right leg

## 2019-12-09 NOTE — ED Provider Notes (Signed)
[place] EMERGENCY DEPARTMENT Provider Note  CSN: [id number] Arrival date & time: 12/08/19 2333  Chief Complaint(s) Eye Problem  HPI William Jennings is a 29 y.o. male with a history of sickle cell disease who presents to the emergency department with sudden onset left eye floater.  Patient denies any trauma.  No change in visual acuity.  No associated pain.  No focal deficits.  Patient reports that he is having pain in bilateral lower extremities consistent with sickle cell pain.  No recent fevers or infections.  No coughing or congestion.  No other physical complaints.  HPI  Past Medical History Past Medical History:  Diagnosis Date  . Chronic prescription opiate use   . Sickle cell anemia (HCC)   . Sickle cell disease [facility])    Patient Active Problem List   Diagnosis Date Noted  . Cough   . Chronic pain syndrome 08/14/2018  . Anemia of chronic disease 08/14/2018  . Sickle cell pain crisis ([place]) 05/29/2018  . Sickle cell anemia with pain ([place]) 04/05/2016  . Acute chest syndrome due to sickle cell crisis ([place])   . Sickle cell anemia with crisis ([place]) 02/21/2014  . Sickle cell anemia ([place]) 08/10/2013  . Chest pain 08/10/2013  . Dehydration 08/08/2013  . Sickle cell crisis ([place]) 08/06/2013  . Sickle-cell/Hb-C disease with crisis ([place]) 01/22/2013  . 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 Fever 06/29/2012  . Leukocytosis 06/29/2012   Home Medication(s) Prior to Admission medications   Medication Sig Start Date End Date Taking? Authorizing Provider  folic acid (FOLVITE) 1 MG tablet Take 1 tablet (1 mg total) by mouth daily. 03/06/19   Dorena Dew, FNP  ibuprofen (ADVIL) 800 MG tablet Take 1 tablet (800 mg total) by mouth every 8 (eight) hours as needed. 11/26/19   Vevelyn Francois, NP  oxyCODONE-acetaminophen (PERCOCET) 5-325 MG tablet Take 1 tablet by mouth every 4 (four) hours as needed for severe pain. 11/26/19   Vevelyn Francois, NP                                                                                                                                     Past Surgical History Past Surgical History:  Procedure Laterality Date  . CHOLECYSTECTOMY     Family History Family History  Family history unknown: Yes    Social History Social History   Tobacco Use  . Smoking status: Former Research scientist (life sciences)  . Smokeless tobacco: Never Used  Vaping Use  . Vaping Use: Never used  Substance Use Topics  . Alcohol use: Yes    Comment: occ  . Drug use: No   Allergies Shellfish allergy  Review of Systems Review of Systems All other systems are reviewed and are negative for acute change except as noted in the HPI  Physical Exam Vital Signs  I have reviewed the triage vital signs BP (!) 92/49 (BP Location:  Right Arm)   Pulse 68   Temp (!) 97.3 F (36.3 C) (Oral)   Resp 14   Wt 71.4 kg   SpO2 98%   BMI 23.92 kg/m   Physical Exam Vitals reviewed.  Constitutional:      General: He is not in acute distress.    Appearance: He is well-developed. He is not diaphoretic.  HENT:     Head: Normocephalic and atraumatic.     Jaw: No trismus.     Right Ear: External ear normal.     Left Ear: External ear normal.     Nose: Nose normal.  Eyes:     General: No scleral icterus.    Conjunctiva/sclera: Conjunctivae normal.     Comments: Semicircular shadowing at 3o'clock adj to disc  Neck:     Trachea: Phonation normal.  Cardiovascular:     Rate and Rhythm: Normal rate and regular rhythm.  Pulmonary:     Effort: Pulmonary effort is normal. No respiratory distress.     Breath sounds: No stridor.  Abdominal:     General: There is no distension.  Musculoskeletal:        General: Normal range of motion.     Cervical back: Normal range of motion.  Neurological:     Mental Status: He is alert and oriented to person, place, and time.  Psychiatric:        Behavior: Behavior normal.     ED Results and Treatments Labs (all labs ordered are listed,  but only abnormal results are displayed) Labs Reviewed - No data to display                                                                                                                       EKG  EKG Interpretation  Date/Time:    Ventricular Rate:    PR Interval:    QRS Duration:   QT Interval:    QTC Calculation:   R Axis:     Text Interpretation:        Radiology No results found.  Pertinent labs & imaging results that were available during my care of the patient were reviewed by me and considered in my medical decision making (see chart for details).  Medications Ordered in ED Medications  oxyCODONE-acetaminophen (PERCOCET/ROXICET) 5-325 MG per tablet 1 tablet (1 tablet Oral Given 12/09/19 0300)  ketorolac (TORADOL) 15 MG/ML injection 15 mg (15 mg Intramuscular Given 12/09/19 0301)  Procedures Ultrasound ED Ocular  Date/Time: 12/09/2019 6:07 AM Performed by: Nira Conn, MD Authorized by: Nira Conn, MD   PROCEDURE DETAILS:    Indications: visual change     Assessed:  Left eye and right eye   Left eye axial view: obtained     Left eye saggital view: obtained     Right eye axial view: obtained     Right eye sagittal view: obtained     Images: archived   Comments:     Flap noted in posterior of left eye    (including critical care time)  Medical Decision Making / ED Course I have reviewed the nursing notes for this encounter and the patient's prior records (if available in EHR or on provided paperwork).   William Jennings was evaluated in Emergency Department on 12/09/2019 for the symptoms described in the history of present illness. He was evaluated in the context of the global COVID-19 pandemic, which necessitated consideration that the patient might be at risk for infection with the SARS-CoV-2 virus that causes  COVID-19. Institutional protocols and algorithms that pertain to the evaluation of patients at risk for COVID-19 are in a state of rapid change based on information released by regulatory bodies including the CDC and federal and state organizations. These policies and algorithms were followed during the patient's care in the ED.  Vitreous vs retinal detachment found on [place]. Spoke with Dr. Cathey Endow who will see patient in clinic immediately after discharge.  Leg pain, typical ss pain. Improved with home medication.      Final Clinical Impression(s) / ED Diagnoses Final diagnoses:  Floaters in visual field, left   The patient appears reasonably screened and/or stabilized for discharge and I doubt any other medical condition or other [facility] requiring further screening, evaluation, or treatment in the ED at this time prior to discharge. Safe for discharge with strict return precautions.  Disposition: Discharge  Condition: Good  I have discussed the results, Dx and Tx plan with the patient/family who expressed understanding and agree(s) with the plan. Discharge instructions discussed at length. The patient/family was given strict return precautions who verbalized understanding of the instructions. No further questions at time of discharge.    ED Discharge Orders    None       Follow Up: Sinda Du, MD [address] [place] [address] [phone]  Go to        This chart was dictated using voice recognition software.  Despite best efforts to proofread,  errors can occur which can change the documentation meaning.   Nira Conn, MD 12/09/19 [phone]

## 2019-12-11 ENCOUNTER — Ambulatory Visit: Payer: Medicaid Other | Admitting: Family Medicine

## 2019-12-17 NOTE — Progress Notes (Signed)
Chart opened in error.   [name]  APRN, MSN, FNP-C Patient Care [facility] [address] [place], [place] [address] [phone]

## 2020-02-24 ENCOUNTER — Other Ambulatory Visit: Payer: Self-pay

## 2020-02-24 ENCOUNTER — Emergency Department (HOSPITAL_BASED_OUTPATIENT_CLINIC_OR_DEPARTMENT_OTHER)
Admission: EM | Admit: 2020-02-24 | Discharge: 2020-02-24 | Disposition: A | Discharge status: Home / Self Care | Payer: Medicaid Other | Attending: Emergency Medicine | Admitting: Emergency Medicine

## 2020-02-24 ENCOUNTER — Encounter (HOSPITAL_BASED_OUTPATIENT_CLINIC_OR_DEPARTMENT_OTHER): Payer: Self-pay | Admitting: Emergency Medicine

## 2020-02-24 DIAGNOSIS — Z87891 Personal history of nicotine dependence: Secondary | ICD-10-CM | POA: Diagnosis not present

## 2020-02-24 DIAGNOSIS — D5700 Hb-SS disease with crisis, unspecified: Secondary | ICD-10-CM | POA: Insufficient documentation

## 2020-02-24 DIAGNOSIS — Z79899 Other long term (current) drug therapy: Secondary | ICD-10-CM | POA: Diagnosis not present

## 2020-02-24 LAB — CBC WITH DIFFERENTIAL/PLATELET
Abs Immature Granulocytes: 0 10*3/uL (ref 0.00–0.07)
Basophils Absolute: 0 10*3/uL (ref 0.0–0.1)
Basophils Relative: 0 %
Eosinophils Absolute: 0 10*3/uL (ref 0.0–0.5)
Eosinophils Relative: 0 %
HCT: 33.1 % — ABNORMAL LOW (ref 39.0–52.0)
Hemoglobin: 12.1 g/dL — ABNORMAL LOW (ref 13.0–17.0)
Lymphocytes Relative: 40 %
Lymphs Abs: 3.2 10*3/uL (ref 0.7–4.0)
MCH: 28.3 pg (ref 26.0–34.0)
MCHC: 36.6 g/dL — ABNORMAL HIGH (ref 30.0–36.0)
MCV: 77.5 fL — ABNORMAL LOW (ref 80.0–100.0)
Monocytes Absolute: 1 10*3/uL (ref 0.1–1.0)
Monocytes Relative: 13 %
Neutro Abs: 3.7 10*3/uL (ref 1.7–7.7)
Neutrophils Relative %: 47 %
Platelets: 183 10*3/uL (ref 150–400)
RBC: 4.27 MIL/uL (ref 4.22–5.81)
RDW: 13.9 % (ref 11.5–15.5)
WBC: 7.9 10*3/uL (ref 4.0–10.5)
nRBC: 1.1 % — ABNORMAL HIGH (ref 0.0–0.2)

## 2020-02-24 LAB — BASIC METABOLIC PANEL
Anion gap: 8 (ref 5–15)
BUN: 10 mg/dL (ref 6–20)
CO2: 24 mmol/L (ref 22–32)
Calcium: 9.1 mg/dL (ref 8.9–10.3)
Chloride: 105 mmol/L (ref 98–111)
Creatinine, Ser: 0.98 mg/dL (ref 0.61–1.24)
GFR calc Af Amer: >60 mL/min (ref >60)
GFR calc non Af Amer: >60 mL/min (ref >60)
Glucose, Bld: 94 mg/dL (ref 70–99)
Potassium: 4 mmol/L (ref 3.5–5.1)
Sodium: 137 mmol/L (ref 135–145)

## 2020-02-24 LAB — RETICULOCYTES
Immature Retic Fract: 33.6 % — ABNORMAL HIGH (ref 2.3–15.9)
RBC.: 4.28 MIL/uL (ref 4.22–5.81)
Retic Count, Absolute: 151.7 10*3/uL (ref 19.0–186.0)
Retic Ct Pct: 3.6 % — ABNORMAL HIGH (ref 0.4–3.1)

## 2020-02-24 MED ORDER — MORPHINE SULFATE 30 MG PO TABS
15.0000 mg | ORAL_TABLET | Freq: Once | ORAL | Status: DC
  Filled 2020-02-24: qty 1

## 2020-02-24 MED ORDER — ONDANSETRON HCL 4 MG/2ML IJ SOLN
4.0000 mg | INTRAMUSCULAR | Status: DC | PRN
  Filled 2020-02-24: qty 2

## 2020-02-24 MED ORDER — DIPHENHYDRAMINE HCL 50 MG/ML IJ SOLN
25.0000 mg | Freq: Once | INTRAMUSCULAR | Status: AC
  Administered 2020-02-24: 25 mg via INTRAVENOUS
  Filled 2020-02-24: qty 1

## 2020-02-24 MED ORDER — OXYCODONE HCL 5 MG PO TABS
15.0000 mg | ORAL_TABLET | Freq: Once | ORAL | Status: AC
  Administered 2020-02-24: 15 mg via ORAL
  Filled 2020-02-24: qty 3

## 2020-02-24 MED ORDER — KETOROLAC TROMETHAMINE 15 MG/ML IJ SOLN
15.0000 mg | INTRAMUSCULAR | Status: AC
  Administered 2020-02-24: 15 mg via INTRAVENOUS
  Filled 2020-02-24: qty 1

## 2020-02-24 MED ORDER — HYDROMORPHONE HCL 1 MG/ML IJ SOLN
1.0000 mg | INTRAMUSCULAR | Status: AC
  Administered 2020-02-24: 1 mg via INTRAVENOUS
  Filled 2020-02-24: qty 1

## 2020-02-24 MED ORDER — DEXTROSE-NACL 5-0.45 % IV SOLN: INTRAVENOUS | Status: DC

## 2020-02-24 NOTE — ED Triage Notes (Signed)
Pt c/o sickle cell crisis with pain to L leg x 2 days.

## 2020-02-24 NOTE — Discharge Instructions (Addendum)
Take home medications as needed.  Call the sickle cell clinic tomorrow morning if needed.  Recommend discussing covid vaccination with [facility] NP.  Return to emergency department for any new or worsening symptoms.  Thank you for allowing [place] to care for you today

## 2020-02-24 NOTE — ED Provider Notes (Signed)
[facility] EMERGENCY DEPARTMENT Provider Note   CSN: [id number] Arrival date & time: 02/24/20  1024     History Chief Complaint  Patient presents with  . Sickle Cell Pain Crisis    William Jennings is a 29 y.o. male with past medical history significant for sickle cell disease.  HPI Presents to emergency department today with chief complaint of sickle cell crisis with left leg pain x2 days.  He has been taking his home oxycodone and ibuprofen without symptom improvement.  He states his pain had a gradual onset and has progressively worsened.  He rates the pain 10 out of 10 in severity.  He denies any fall, injury or trauma to his leg.  He states he typically has sickle cell pain in his legs and arms.  Usually pain is exacerbated by changes in weather and increased stress.  She denies any of those today.  He denies any fever, headache, visual change, chest pain or shortness of breath, abdominal pain, dysuria, lower extremity edema.    Past Medical History:  Diagnosis Date  . Chronic prescription opiate use   . Sickle cell anemia (HCC)   . Sickle cell disease [facility])     Patient Active Problem List   Diagnosis Date Noted  . Cough   . Chronic pain syndrome 08/14/2018  . Anemia of chronic disease 08/14/2018  . Sickle cell pain crisis (HCC) 05/29/2018  . Sickle cell anemia with pain (HCC) 04/05/2016  . Acute chest syndrome due to sickle cell crisis (HCC)   . Sickle cell anemia with crisis (HCC) 02/21/2014  . Sickle cell anemia (HCC) 08/10/2013  . Chest pain 08/10/2013  . Dehydration 08/08/2013  . Sickle cell crisis (HCC) 08/06/2013  . 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 Fever 06/29/2012  . Leukocytosis 06/29/2012    Past Surgical History:  Procedure Laterality Date  . CHOLECYSTECTOMY         Family History  Family history unknown: Yes    Social History   Tobacco Use  . Smoking status: Former  Games developer  . Smokeless tobacco: Never Used  Vaping Use  . Vaping Use: Never used  Substance Use Topics  . Alcohol use: Yes    Comment: occ  . Drug use: No    Home Medications Prior to Admission medications   Medication Sig Start Date End Date Taking? Authorizing Provider  folic acid (FOLVITE) 1 MG tablet Take 1 tablet (1 mg total) by mouth daily. 03/06/19   Massie Maroon, FNP  ibuprofen (ADVIL) 800 MG tablet Take 1 tablet (800 mg total) by mouth every 8 (eight) hours as needed. 11/26/19   Barbette Merino, NP  oxyCODONE-acetaminophen (PERCOCET) 5-325 MG tablet Take 1 tablet by mouth every 4 (four) hours as needed for severe pain. 11/26/19   Barbette Merino, NP    Allergies    Shellfish allergy  Review of Systems   Review of Systems All other systems are reviewed and are negative for acute change except as noted in the HPI.  Physical Exam Updated Vital Signs BP 106/72 (BP Location: Left Arm)   Pulse 74   Temp 98.8 F (37.1 C) (Oral)   Resp 18   Ht 5\' 8"  (1.727 m)   Wt 70.3 kg   SpO2 98%   BMI 23.57 kg/m   Physical Exam Vitals and nursing note reviewed.  Constitutional:      General: He is not in  acute distress.    Appearance: He is not ill-appearing.  HENT:     Head: Normocephalic and atraumatic.     Right Ear: Tympanic membrane and external ear normal.     Left Ear: Tympanic membrane and external ear normal.     Nose: Nose normal.     Mouth/Throat:     Mouth: Mucous membranes are moist.     Pharynx: Oropharynx is clear.  Eyes:     General: No scleral icterus.       Right eye: No discharge.        Left eye: No discharge.     Extraocular Movements: Extraocular movements intact.     Conjunctiva/sclera: Conjunctivae normal.     Pupils: Pupils are equal, round, and reactive to light.  Neck:     Vascular: No JVD.  Cardiovascular:     Rate and Rhythm: Normal rate and regular rhythm.     Pulses: Normal pulses.          Radial pulses are 2+ on the right side and 2+  on the left side.       Dorsalis pedis pulses are 2+ on the right side and 2+ on the left side.     Heart sounds: Normal heart sounds.  Pulmonary:     Comments: Lungs clear to auscultation in all fields. Symmetric chest rise. No wheezing, rales, or rhonchi. Abdominal:     Comments: Abdomen is soft, non-distended, and non-tender in all quadrants. No rigidity, no guarding. No peritoneal signs.  Musculoskeletal:        General: Normal range of motion.     Cervical back: Normal range of motion.     Comments: No swelling noted to left lower extremity. Compartments are soft. Left lower extremity is neurovascularly intact.   Tenderness to palpation of patella. No crepitus, no deformity, no effusion. No warmth. Full ROM of left knee and ankle  Skin:    General: Skin is warm and dry.     Capillary Refill: Capillary refill takes less than 2 seconds.  Neurological:     Mental Status: He is oriented to person, place, and time.     GCS: GCS eye subscore is 4. GCS verbal subscore is 5. GCS motor subscore is 6.     Comments: Fluent speech, no facial droop.  Psychiatric:        Behavior: Behavior normal.     ED Results / Procedures / Treatments   Labs (all labs ordered are listed, but only abnormal results are displayed) Labs Reviewed  RETICULOCYTES - Abnormal; Notable for the following components:      Result Value   Retic Ct Pct 3.6 (*)    Immature Retic Fract 33.6 (*)    All other components within normal limits  CBC WITH DIFFERENTIAL/PLATELET - Abnormal; Notable for the following components:   Hemoglobin 12.1 (*)    HCT 33.1 (*)    MCV 77.5 (*)    MCHC 36.6 (*)    nRBC 1.1 (*)    All other components within normal limits  BASIC METABOLIC PANEL    EKG None  Radiology No results found.  Procedures Procedures (including critical care time)  Medications Ordered in ED Medications  ondansetron (ZOFRAN) injection 4 mg (has no administration in time range)  dextrose 5 %-0.45 %  sodium chloride infusion ( Intravenous New Bag/Given 02/24/20 1118)  ketorolac (TORADOL) 15 MG/ML injection 15 mg (15 mg Intravenous Given 02/24/20 1122)  HYDROmorphone (DILAUDID) injection 1 mg (1  mg Intravenous Given 02/24/20 1125)  HYDROmorphone (DILAUDID) injection 1 mg (1 mg Intravenous Given 02/24/20 1246)  diphenhydrAMINE (BENADRYL) injection 25 mg (25 mg Intravenous Given 02/24/20 1120)  oxyCODONE (Oxy IR/ROXICODONE) immediate release tablet 15 mg (15 mg Oral Given 02/24/20 1347)    ED Course  I have reviewed the triage vital signs and the nursing notes.  Pertinent labs & imaging results that were available during my care of the patient were reviewed by me and considered in my medical decision making (see chart for details).    MDM Rules/Calculators/A&P                          History provided by patient with additional history obtained from chart review.    29 y.o. male with PMH/o Sickle Cell anemia who presents for evaluation of sickle cell related pain.  Patient is afebrile, non-toxic appearing, sitting comfortably on examination table. Vital signs reviewed and stable. No joint swelling, no abnormal findings on lower extremity exam, mild tenderness to palpation of left patella. No crepitus, no erythema, edema, or warmth. No findings to suggest septic joint. Will give D5 1/2 NS and analgesics. We will reassess patient after 3 doses. Initials labs ordered.   Labs reviewed. CBC without leukocytosis, hemoglobin consistent with baseline. BMP unremarkable.  Patient reports pain improved after 3 rounds of pain medication. Will plan for discharge home. Advise patient to follow up with sickle cell clinic if needed. Strict return precautions discussed.    Portions of this note were generated with Scientist, clinical (histocompatibility and immunogenetics). Dictation errors may occur despite best attempts at proofreading.    Final Clinical Impression(s) / ED Diagnoses Final diagnoses:  Sickle cell anemia with pain [facility])     Rx / DC Orders ED Discharge Orders    None       Sherene Sires, PA-C 02/24/20 1500    Melene Plan, DO 02/24/20 1514

## 2020-02-25 ENCOUNTER — Encounter (HOSPITAL_BASED_OUTPATIENT_CLINIC_OR_DEPARTMENT_OTHER): Payer: Self-pay

## 2020-02-25 ENCOUNTER — Other Ambulatory Visit: Payer: Self-pay

## 2020-02-25 ENCOUNTER — Emergency Department (HOSPITAL_BASED_OUTPATIENT_CLINIC_OR_DEPARTMENT_OTHER)
Admission: EM | Admit: 2020-02-25 | Discharge: 2020-02-25 | Disposition: A | Discharge status: Home / Self Care | Payer: Medicaid Other | Attending: Emergency Medicine | Admitting: Emergency Medicine

## 2020-02-25 DIAGNOSIS — Z87891 Personal history of nicotine dependence: Secondary | ICD-10-CM | POA: Insufficient documentation

## 2020-02-25 DIAGNOSIS — D57219 Sickle-cell/Hb-C disease with crisis, unspecified: Secondary | ICD-10-CM | POA: Diagnosis not present

## 2020-02-25 DIAGNOSIS — D5700 Hb-SS disease with crisis, unspecified: Secondary | ICD-10-CM

## 2020-02-25 LAB — COMPREHENSIVE METABOLIC PANEL
ALT: 18 U/L (ref 0–44)
AST: 31 U/L (ref 15–41)
Albumin: 4.4 g/dL (ref 3.5–5.0)
Alkaline Phosphatase: 93 U/L (ref 38–126)
Anion gap: 8 (ref 5–15)
BUN: 9 mg/dL (ref 6–20)
CO2: 24 mmol/L (ref 22–32)
Calcium: 9.2 mg/dL (ref 8.9–10.3)
Chloride: 106 mmol/L (ref 98–111)
Creatinine, Ser: 0.93 mg/dL (ref 0.61–1.24)
GFR calc Af Amer: >60 mL/min (ref >60)
GFR calc non Af Amer: >60 mL/min (ref >60)
Glucose, Bld: 128 mg/dL — ABNORMAL HIGH (ref 70–99)
Potassium: 3.8 mmol/L (ref 3.5–5.1)
Sodium: 138 mmol/L (ref 135–145)
Total Bilirubin: 1.4 mg/dL — ABNORMAL HIGH (ref 0.3–1.2)
Total Protein: 7.9 g/dL (ref 6.5–8.1)

## 2020-02-25 LAB — RETICULOCYTES
Immature Retic Fract: 28.6 % — ABNORMAL HIGH (ref 2.3–15.9)
RBC.: 4.4 MIL/uL (ref 4.22–5.81)
Retic Count, Absolute: 167.2 10*3/uL (ref 19.0–186.0)
Retic Ct Pct: 3.8 % — ABNORMAL HIGH (ref 0.4–3.1)

## 2020-02-25 LAB — CBC WITH DIFFERENTIAL/PLATELET
Abs Immature Granulocytes: 0.07 10*3/uL (ref 0.00–0.07)
Basophils Absolute: 0 10*3/uL (ref 0.0–0.1)
Basophils Relative: 0 %
Eosinophils Absolute: 0 10*3/uL (ref 0.0–0.5)
Eosinophils Relative: 0 %
HCT: 34.3 % — ABNORMAL LOW (ref 39.0–52.0)
Hemoglobin: 12.4 g/dL — ABNORMAL LOW (ref 13.0–17.0)
Immature Granulocytes: 1 %
Lymphocytes Relative: 27 %
Lymphs Abs: 2.9 10*3/uL (ref 0.7–4.0)
MCH: 28.2 pg (ref 26.0–34.0)
MCHC: 36.2 g/dL — ABNORMAL HIGH (ref 30.0–36.0)
MCV: 78 fL — ABNORMAL LOW (ref 80.0–100.0)
Monocytes Absolute: 1.2 10*3/uL — ABNORMAL HIGH (ref 0.1–1.0)
Monocytes Relative: 12 %
Neutro Abs: 6.5 10*3/uL (ref 1.7–7.7)
Neutrophils Relative %: 60 %
Platelets: 179 10*3/uL (ref 150–400)
RBC: 4.4 MIL/uL (ref 4.22–5.81)
RDW: 13.9 % (ref 11.5–15.5)
WBC: 10.7 10*3/uL — ABNORMAL HIGH (ref 4.0–10.5)
nRBC: 1 % — ABNORMAL HIGH (ref 0.0–0.2)

## 2020-02-25 MED ORDER — HYDROMORPHONE HCL 1 MG/ML IJ SOLN
1.0000 mg | INTRAMUSCULAR | Status: AC
  Administered 2020-02-25: 1 mg via INTRAVENOUS
  Filled 2020-02-25: qty 1

## 2020-02-25 MED ORDER — HYDROMORPHONE HCL 1 MG/ML IJ SOLN
1.0000 mg | Freq: Once | INTRAMUSCULAR | Status: AC
  Administered 2020-02-25: 1 mg via INTRAVENOUS
  Filled 2020-02-25: qty 1

## 2020-02-25 MED ORDER — ONDANSETRON HCL 4 MG/2ML IJ SOLN
4.0000 mg | INTRAMUSCULAR | Status: DC | PRN
  Administered 2020-02-25: 4 mg via INTRAVENOUS
  Filled 2020-02-25: qty 2

## 2020-02-25 MED ORDER — KETOROLAC TROMETHAMINE 15 MG/ML IJ SOLN
15.0000 mg | INTRAMUSCULAR | Status: AC
  Administered 2020-02-25: 15 mg via INTRAVENOUS
  Filled 2020-02-25: qty 1

## 2020-02-25 MED ORDER — SODIUM CHLORIDE 0.45 % IV SOLN: INTRAVENOUS | Status: DC

## 2020-02-25 MED ORDER — DIPHENHYDRAMINE HCL 25 MG PO CAPS: 25.0000 mg | ORAL_CAPSULE | ORAL | Status: DC | PRN

## 2020-02-25 MED ORDER — OXYCODONE HCL 5 MG PO TABS
15.0000 mg | ORAL_TABLET | Freq: Once | ORAL | Status: AC
  Administered 2020-02-25: 15 mg via ORAL
  Filled 2020-02-25: qty 3

## 2020-02-25 NOTE — ED Provider Notes (Signed)
[facility] EMERGENCY DEPARTMENT Provider Note   CSN: [id number] Arrival date & time: 02/25/20  1007     History Chief Complaint  Patient presents with  . Sickle Cell Pain Crisis    William Jennings is a 29 y.o. male.  Patient with history of sickle cell disease presents with pain crisis.  Today is day 3 of symptoms.  Patient was seen here yesterday and discharged home improved condition.  Last night his pain became worse and he has been up with severe pain since about 4 AM today.  Patient reports aching pain from his left hip down to just below the left knee.  No swelling or redness of the leg.  He denies associated fevers, URI symptoms, chest pain, shortness of breath, or difficulty breathing.  No nausea, vomiting, or diarrhea.  Patient has home oxycodone which he uses as needed but this has not been helping.  He has also tried ibuprofen.  Reports that he called the sickle cell clinic today but did not have room so he represented here today.        Past Medical History:  Diagnosis Date  . Chronic prescription opiate use   . Sickle cell anemia (HCC)   . Sickle cell disease [facility])     Patient Active Problem List   Diagnosis Date Noted  . Cough   . Chronic pain syndrome 08/14/2018  . Anemia of chronic disease 08/14/2018  . Sickle cell pain crisis (HCC) 05/29/2018  . Sickle cell anemia with pain (HCC) 04/05/2016  . Acute chest syndrome due to sickle cell crisis (HCC)   . Sickle cell anemia with crisis (HCC) 02/21/2014  . Sickle cell anemia (HCC) 08/10/2013  . Chest pain 08/10/2013  . Dehydration 08/08/2013  . Sickle cell crisis (HCC) 08/06/2013  . 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 Fever 06/29/2012  . Leukocytosis 06/29/2012    Past Surgical History:  Procedure Laterality Date  . CHOLECYSTECTOMY         Family History  Family history unknown: Yes    Social History   Tobacco Use  . Smoking  status: Former Games developer  . Smokeless tobacco: Never Used  Vaping Use  . Vaping Use: Never used  Substance Use Topics  . Alcohol use: Yes    Comment: occ  . Drug use: No    Home Medications Prior to Admission medications   Medication Sig Start Date End Date Taking? Authorizing Provider  folic acid (FOLVITE) 1 MG tablet Take 1 tablet (1 mg total) by mouth daily. 03/06/19   Massie Maroon, FNP  ibuprofen (ADVIL) 800 MG tablet Take 1 tablet (800 mg total) by mouth every 8 (eight) hours as needed. 11/26/19   Barbette Merino, NP  oxyCODONE-acetaminophen (PERCOCET) 5-325 MG tablet Take 1 tablet by mouth every 4 (four) hours as needed for severe pain. 11/26/19   Barbette Merino, NP    Allergies    Shellfish allergy  Review of Systems   Review of Systems  Constitutional: Negative for fever.  HENT: Negative for rhinorrhea and sore throat.   Eyes: Negative for redness.  Respiratory: Negative for cough.   Cardiovascular: Negative for chest pain.  Gastrointestinal: Negative for abdominal pain, diarrhea, nausea and vomiting.  Genitourinary: Negative for dysuria and hematuria.  Musculoskeletal: Positive for arthralgias and myalgias.  Skin: Negative for rash.  Neurological: Negative for headaches.    Physical Exam Updated Vital Signs BP 132/77 (BP  Location: Left Arm)   Pulse 78   Temp 98.6 F (37 C) (Oral)   Resp 20   Ht 5\' 8"  (1.727 m)   Wt 70.3 kg   SpO2 100%   BMI 23.57 kg/m   Physical Exam Vitals and nursing note reviewed.  Constitutional:      Appearance: He is well-developed.  HENT:     Head: Normocephalic and atraumatic.     Mouth/Throat:     Mouth: Mucous membranes are moist.  Eyes:     General:        Right eye: No discharge.        Left eye: No discharge.     Conjunctiva/sclera: Conjunctivae normal.  Cardiovascular:     Rate and Rhythm: Normal rate and regular rhythm.     Heart sounds: Normal heart sounds.  Pulmonary:     Effort: Pulmonary effort is normal.      Breath sounds: Normal breath sounds.     Comments: Lungs are clear on exam. Abdominal:     Palpations: Abdomen is soft.     Tenderness: There is no abdominal tenderness.  Musculoskeletal:        General: No swelling, tenderness or deformity.     Cervical back: Normal range of motion and neck supple.     Comments: Patient without swelling or redness of the right lower extremity.  No joint effusions or signs of cellulitis.  Skin:    General: Skin is warm and dry.  Neurological:     Mental Status: He is alert.     ED Results / Procedures / Treatments   Labs (all labs ordered are listed, but only abnormal results are displayed) Labs Reviewed  CBC WITH DIFFERENTIAL/PLATELET - Abnormal; Notable for the following components:      Result Value   WBC 10.7 (*)    Hemoglobin 12.4 (*)    HCT 34.3 (*)    MCV 78.0 (*)    MCHC 36.2 (*)    nRBC 1.0 (*)    Monocytes Absolute 1.2 (*)    All other components within normal limits  COMPREHENSIVE METABOLIC PANEL - Abnormal; Notable for the following components:   Glucose, Bld 128 (*)    Total Bilirubin 1.4 (*)    All other components within normal limits  RETICULOCYTES - Abnormal; Notable for the following components:   Retic Ct Pct 3.8 (*)    Immature Retic Fract 28.6 (*)    All other components within normal limits    EKG None  Radiology No results found.  Procedures Procedures (including critical care time)  Medications Ordered in ED Medications  0.45 % sodium chloride infusion ( Intravenous Stopped 02/25/20 1335)  diphenhydrAMINE (BENADRYL) capsule 25-50 mg (has no administration in time range)  ondansetron (ZOFRAN) injection 4 mg (4 mg Intravenous Given 02/25/20 1152)  ketorolac (TORADOL) 15 MG/ML injection 15 mg (15 mg Intravenous Given 02/25/20 1152)  HYDROmorphone (DILAUDID) injection 1 mg (1 mg Intravenous Given 02/25/20 1152)  HYDROmorphone (DILAUDID) injection 1 mg (1 mg Intravenous Given 02/25/20 1235)  oxyCODONE (Oxy  IR/ROXICODONE) immediate release tablet 15 mg (15 mg Oral Given 02/25/20 1150)  HYDROmorphone (DILAUDID) injection 1 mg (1 mg Intravenous Given 02/25/20 1339)    ED Course  I have reviewed the triage vital signs and the nursing notes.  Pertinent labs & imaging results that were available during my care of the patient were reviewed by me and considered in my medical decision making (see chart for details).  Patient  seen and examined. Work-up initiated. Medications ordered. Reviewed visit from yesterday.   Vital signs reviewed and are as follows: BP 132/77 (BP Location: Left Arm)   Pulse 78   Temp 98.6 F (37 C) (Oral)   Resp 20   Ht 5\' 8"  (1.727 m)   Wt 70.3 kg   SpO2 100%   BMI 23.57 kg/m   1:24 PM Pain 9/10 > 7/10. Will give additional dose of medication and then decide on dispo.   2:24 PM patient rechecked.  He continues to feel better.  Plan for discharged home.  Patient encouraged to use home pain medications on a schedule over the next 24 to 48 hours.  Encourage PCP follow-up.  Encouraged return to the emergency department with worsening symptoms, fevers, trouble breathing, or other concerns.    MDM Rules/Calculators/A&P                          Patient with sickle cell disease, vaso-occlusive crisis causing pain in the leg.  No signs of swelling, redness, joint effusion.  Do not suspect DVT.  Patient looks well, nontoxic.  No chest or breathing symptoms.  Pain better controlled after parenteral narcotics in the ED.  Patient wants to go home.   Final Clinical Impression(s) / ED Diagnoses Final diagnoses:  Sickle cell pain crisis [facility])    Rx / DC Orders ED Discharge Orders    None       [facility], PA-C 02/25/20 1426    02/27/20, MD 02/25/20 1711

## 2020-02-25 NOTE — Discharge Instructions (Signed)
Please read and follow all provided instructions.  Your diagnoses today include:  1. Sickle cell pain crisis (HCC)     Tests performed today include: Blood cell counts - were normal Electrolytes and kidney function - were normal  Vital signs. See below for your results today.   Medications prescribed:   None  Take any prescribed medications only as directed.  Home care instructions:  Follow any educational materials contained in this packet.  Continue home pain medications as prescribed.   Follow-up instructions: Please follow-up with your primary care provider in the next 2 days for further evaluation of your symptoms.   Return instructions:   Please return to the Emergency Department if you experience worsening symptoms.   Please return if you have any other emergent concerns.  Additional Information:  Your vital signs today were: BP 125/73 (BP Location: Left Arm)   Pulse 66   Temp 97.6 F (36.4 C) (Oral)   Resp 20   Ht 5\' 8"  (1.727 m)   Wt 70.3 kg   SpO2 99%   BMI 23.57 kg/m  If your blood pressure (BP) was elevated above 135/85 this visit, please have this repeated by your doctor within one month. --------------

## 2020-02-25 NOTE — ED Notes (Signed)
AVS reviewed with pt, opportunity for questions provided, copy of AVS given to pt

## 2020-02-25 NOTE — ED Triage Notes (Signed)
Pt arrives with c/o sickle cell pain to left leg X3 days, reports he was seen here yesterday, did follow up with clinic but they are now accepting patients today.

## 2020-02-25 NOTE — ED Notes (Signed)
ED Provider at bedside. 

## 2020-02-25 NOTE — ED Notes (Signed)
Appears more comfortable, ambulated to restroom without assistance

## 2020-02-26 ENCOUNTER — Telehealth (HOSPITAL_COMMUNITY): Payer: Self-pay | Admitting: General Practice

## 2020-02-26 NOTE — Telephone Encounter (Signed)
Patient called, requesting to come to the day hospital due to pain in the left leg rated at 8/10. Per provider, the [facility] is full today. Patient told to go to the ER if the pain is unbearable and to continue to take prescribed pain medication. Patient notified, verbalized understanding.

## 2020-04-07 ENCOUNTER — Other Ambulatory Visit: Payer: Self-pay

## 2020-04-07 ENCOUNTER — Emergency Department (HOSPITAL_BASED_OUTPATIENT_CLINIC_OR_DEPARTMENT_OTHER): Payer: Medicaid Other

## 2020-04-07 ENCOUNTER — Encounter (HOSPITAL_BASED_OUTPATIENT_CLINIC_OR_DEPARTMENT_OTHER): Payer: Self-pay | Admitting: Emergency Medicine

## 2020-04-07 ENCOUNTER — Emergency Department (HOSPITAL_BASED_OUTPATIENT_CLINIC_OR_DEPARTMENT_OTHER)
Admission: EM | Admit: 2020-04-07 | Discharge: 2020-04-07 | Disposition: A | Discharge status: Home / Self Care | Payer: Medicaid Other | Attending: Emergency Medicine | Admitting: Emergency Medicine

## 2020-04-07 DIAGNOSIS — D5700 Hb-SS disease with crisis, unspecified: Secondary | ICD-10-CM

## 2020-04-07 DIAGNOSIS — Z87891 Personal history of nicotine dependence: Secondary | ICD-10-CM | POA: Diagnosis not present

## 2020-04-07 LAB — COMPREHENSIVE METABOLIC PANEL
ALT: 26 U/L (ref 0–44)
AST: 41 U/L (ref 15–41)
Albumin: 4 g/dL (ref 3.5–5.0)
Alkaline Phosphatase: 83 U/L (ref 38–126)
Anion gap: 7 (ref 5–15)
BUN: 9 mg/dL (ref 6–20)
CO2: 24 mmol/L (ref 22–32)
Calcium: 9 mg/dL (ref 8.9–10.3)
Chloride: 106 mmol/L (ref 98–111)
Creatinine, Ser: 0.94 mg/dL (ref 0.61–1.24)
GFR, Estimated: >60 mL/min (ref >60)
Glucose, Bld: 100 mg/dL — ABNORMAL HIGH (ref 70–99)
Potassium: 4 mmol/L (ref 3.5–5.1)
Sodium: 137 mmol/L (ref 135–145)
Total Bilirubin: 1.2 mg/dL (ref 0.3–1.2)
Total Protein: 6.9 g/dL (ref 6.5–8.1)

## 2020-04-07 LAB — CBC WITH DIFFERENTIAL/PLATELET
Abs Immature Granulocytes: 0.03 10*3/uL (ref 0.00–0.07)
Basophils Absolute: 0 10*3/uL (ref 0.0–0.1)
Basophils Relative: 0 %
Eosinophils Absolute: 0.1 10*3/uL (ref 0.0–0.5)
Eosinophils Relative: 1 %
HCT: 32.8 % — ABNORMAL LOW (ref 39.0–52.0)
Hemoglobin: 12.1 g/dL — ABNORMAL LOW (ref 13.0–17.0)
Immature Granulocytes: 0 %
Lymphocytes Relative: 51 %
Lymphs Abs: 4.1 10*3/uL — ABNORMAL HIGH (ref 0.7–4.0)
MCH: 28.7 pg (ref 26.0–34.0)
MCHC: 36.9 g/dL — ABNORMAL HIGH (ref 30.0–36.0)
MCV: 77.9 fL — ABNORMAL LOW (ref 80.0–100.0)
Monocytes Absolute: 0.9 10*3/uL (ref 0.1–1.0)
Monocytes Relative: 11 %
Neutro Abs: 3 10*3/uL (ref 1.7–7.7)
Neutrophils Relative %: 37 %
Platelets: 152 10*3/uL (ref 150–400)
RBC: 4.21 MIL/uL — ABNORMAL LOW (ref 4.22–5.81)
RDW: 14.6 % (ref 11.5–15.5)
WBC: 8.1 10*3/uL (ref 4.0–10.5)
nRBC: 1.1 % — ABNORMAL HIGH (ref 0.0–0.2)

## 2020-04-07 LAB — RETICULOCYTES
Immature Retic Fract: 35.1 % — ABNORMAL HIGH (ref 2.3–15.9)
RBC.: 4.25 MIL/uL (ref 4.22–5.81)
Retic Count, Absolute: 178.9 10*3/uL (ref 19.0–186.0)
Retic Ct Pct: 4.2 % — ABNORMAL HIGH (ref 0.4–3.1)

## 2020-04-07 MED ORDER — HYDROMORPHONE HCL 1 MG/ML IJ SOLN
1.0000 mg | Freq: Once | INTRAMUSCULAR | Status: AC
  Administered 2020-04-07: 1 mg via INTRAVENOUS
  Filled 2020-04-07: qty 1

## 2020-04-07 MED ORDER — OXYCODONE HCL 5 MG PO TABS
15.0000 mg | ORAL_TABLET | Freq: Once | ORAL | Status: AC
  Administered 2020-04-07: 15 mg via ORAL
  Filled 2020-04-07: qty 3

## 2020-04-07 MED ORDER — KETOROLAC TROMETHAMINE 15 MG/ML IJ SOLN
15.0000 mg | INTRAMUSCULAR | Status: AC
  Administered 2020-04-07: 15 mg via INTRAVENOUS
  Filled 2020-04-07: qty 1

## 2020-04-07 MED ORDER — ONDANSETRON HCL 4 MG/2ML IJ SOLN
4.0000 mg | INTRAMUSCULAR | Status: DC | PRN
  Administered 2020-04-07: 4 mg via INTRAVENOUS
  Filled 2020-04-07: qty 2

## 2020-04-07 MED ORDER — SODIUM CHLORIDE 0.45 % IV SOLN: INTRAVENOUS | Status: DC

## 2020-04-07 MED ORDER — DIPHENHYDRAMINE HCL 25 MG PO CAPS
25.0000 mg | ORAL_CAPSULE | ORAL | Status: DC | PRN
  Administered 2020-04-07: 25 mg via ORAL
  Filled 2020-04-07: qty 1

## 2020-04-07 MED ORDER — HYDROMORPHONE HCL 1 MG/ML IJ SOLN
1.0000 mg | INTRAMUSCULAR | Status: AC
  Administered 2020-04-07: 1 mg via INTRAVENOUS
  Filled 2020-04-07: qty 1

## 2020-04-07 NOTE — Discharge Instructions (Addendum)
Continue using your home pain medications and follow-up at the sickle cell center as needed.

## 2020-04-07 NOTE — ED Triage Notes (Signed)
Sickle Cell Pain in legs for 2 days.

## 2020-04-07 NOTE — ED Provider Notes (Signed)
[facility] EMERGENCY DEPARTMENT Provider Note   CSN: [id number] Arrival date & time: 04/07/20  1903     History Chief Complaint  Patient presents with  . Sickle Cell Pain Crisis    Fonnie JarvisGeovanni D Kirk is a 29 y.o. male.  Fonnie JarvisGeovanni D Landenberger is a 29 y.o. male with a history of sickle cell anemia, who presents to the ED for evaluation of pain in his legs over the past 2 days.  He reports this pain is typical for him with his sickle cell crisis.  He reports a constant aching in his legs over the past 2 days that was worse when he woke up this morning.  He has tried using his home ibuprofen and oxycodone but has not been able to get pain under control.  He is followed by Armeniahina Hollis, NP at the [facility]le cell center.  He denies any focal joint pain or swelling.  States that this morning he had some brief pain over his left ribs that has since resolved.  No cough, fever or chills.  No abdominal pain, nausea or vomiting.  No headaches, numbness tingling or weakness.  No other aggravating or alleviating factors.        Past Medical History:  Diagnosis Date  . Chronic prescription opiate use   . Sickle cell anemia (HCC)   . Sickle cell disease [facility](HCC)     Patient Active Problem List   Diagnosis Date Noted  . Cough   . Chronic pain syndrome 08/14/2018  . Anemia of chronic disease 08/14/2018  . Sickle cell pain crisis (HCC) 05/29/2018  . Sickle cell anemia with pain (HCC) 04/05/2016  . Acute chest syndrome due to sickle cell crisis (HCC)   . Sickle cell anemia with crisis (HCC) 02/21/2014  . Sickle cell anemia (HCC) 08/10/2013  . Chest pain 08/10/2013  . Dehydration 08/08/2013  . Sickle cell crisis (HCC) 08/06/2013  . 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 Fever 06/29/2012  . Leukocytosis 06/29/2012    Past Surgical History:  Procedure Laterality Date  . CHOLECYSTECTOMY         Family History  Family history  unknown: Yes    Social History   Tobacco Use  . Smoking status: Former Games developermoker  . Smokeless tobacco: Never Used  Vaping Use  . Vaping Use: Never used  Substance Use Topics  . Alcohol use: Yes    Comment: occ  . Drug use: No    Home Medications Prior to Admission medications   Medication Sig Start Date End Date Taking? Authorizing Provider  folic acid (FOLVITE) 1 MG tablet Take 1 tablet (1 mg total) by mouth daily. 03/06/19   Massie MaroonHollis, Lachina M, FNP  ibuprofen (ADVIL) 800 MG tablet Take 1 tablet (800 mg total) by mouth every 8 (eight) hours as needed. 11/26/19   Barbette MerinoKing, Crystal M, NP  oxyCODONE-acetaminophen (PERCOCET) 5-325 MG tablet Take 1 tablet by mouth every 4 (four) hours as needed for severe pain. 11/26/19   Barbette MerinoKing, Crystal M, NP    Allergies    Shellfish allergy  Review of Systems   Review of Systems  Constitutional: Negative for chills and fever.  HENT: Negative.   Eyes: Negative for visual disturbance.  Respiratory: Negative for cough and shortness of breath.   Cardiovascular: Negative for chest pain and leg swelling.  Gastrointestinal: Negative for abdominal pain, nausea and vomiting.  Musculoskeletal: Positive for myalgias. Negative for arthralgias.  Skin: Negative  for color change and rash.  Neurological: Negative for weakness, numbness and headaches.  All other systems reviewed and are negative.   Physical Exam Updated Vital Signs BP 106/61   Pulse 65   Temp 98.3 F (36.8 C) (Oral)   Resp 14   SpO2 97%   Physical Exam Vitals and nursing note reviewed.  Constitutional:      General: He is not in acute distress.    Appearance: Normal appearance. He is well-developed. He is not ill-appearing or diaphoretic.     Comments: Patient appears uncomfortable but is overall well-appearing and in no acute distress  HENT:     Head: Normocephalic and atraumatic.     Mouth/Throat:     Mouth: Mucous membranes are moist.     Pharynx: Oropharynx is clear.  Eyes:      General:        Right eye: No discharge.        Left eye: No discharge.  Cardiovascular:     Rate and Rhythm: Normal rate and regular rhythm.     Heart sounds: Normal heart sounds. No murmur heard.  No friction rub. No gallop.   Pulmonary:     Effort: Pulmonary effort is normal. No respiratory distress.     Breath sounds: Normal breath sounds. No wheezing or rales.     Comments: Respirations equal and unlabored, patient able to speak in full sentences, lungs clear to auscultation bilaterally, no chest wall tenderness Abdominal:     General: Bowel sounds are normal. There is no distension.     Palpations: Abdomen is soft. There is no mass.     Tenderness: There is no abdominal tenderness. There is no guarding.     Comments: Abdomen soft, nondistended, nontender to palpation in all quadrants without guarding or peritoneal signs  Musculoskeletal:        General: No deformity.     Cervical back: Neck supple.     Right lower leg: No edema.     Left lower leg: No edema.     Comments: Bilateral lower extremities warm and well perfused without edema or tenderness, distal pulses intact  Skin:    General: Skin is warm and dry.     Capillary Refill: Capillary refill takes less than 2 seconds.  Neurological:     Mental Status: He is alert.     Coordination: Coordination normal.     Comments: Speech is clear, able to follow commands Moves extremities without ataxia, coordination intact  Psychiatric:        Mood and Affect: Mood normal.        Behavior: Behavior normal.     ED Results / Procedures / Treatments   Labs (all labs ordered are listed, but only abnormal results are displayed) Labs Reviewed  CBC WITH DIFFERENTIAL/PLATELET - Abnormal; Notable for the following components:      Result Value   RBC 4.21 (*)    Hemoglobin 12.1 (*)    HCT 32.8 (*)    MCV 77.9 (*)    MCHC 36.9 (*)    nRBC 1.1 (*)    Lymphs Abs 4.1 (*)    All other components within normal limits  COMPREHENSIVE  METABOLIC PANEL - Abnormal; Notable for the following components:   Glucose, Bld 100 (*)    All other components within normal limits  RETICULOCYTES - Abnormal; Notable for the following components:   Retic Ct Pct 4.2 (*)    Immature Retic Fract 35.1 (*)  All other components within normal limits    EKG EKG Interpretation  Date/Time:  Monday April 07 2020 20:31:49 EDT Ventricular Rate:  85 PR Interval:    QRS Duration: 81 QT Interval:  370 QTC Calculation: 440 R Axis:   74 Text Interpretation: Sinus rhythm Borderline T wave abnormalities Minimal ST elevation, anterior leads No significant change since last tracing Confirmed by Susy Frizzle [phone]) on 04/07/2020 9:02:58 PM   Radiology DG Chest Portable 1 View  Result Date: 04/07/2020 CLINICAL DATA:  Sickle cell crisis EXAM: PORTABLE CHEST 1 VIEW COMPARISON:  08/15/2018 FINDINGS: The heart size and mediastinal contours are within normal limits. Both lungs are clear. The visualized skeletal structures are unremarkable. IMPRESSION: No active disease. Electronically Signed   By: Deatra Robinson M.D.   On: 04/07/2020 20:16    Procedures Procedures (including critical care time)  Medications Ordered in ED Medications  0.45 % sodium chloride infusion ( Intravenous New Bag/Given 04/07/20 1933)  diphenhydrAMINE (BENADRYL) capsule 25-50 mg (25 mg Oral Given 04/07/20 1934)  ondansetron (ZOFRAN) injection 4 mg (4 mg Intravenous Given 04/07/20 1929)  ketorolac (TORADOL) 15 MG/ML injection 15 mg (15 mg Intravenous Given 04/07/20 1929)  HYDROmorphone (DILAUDID) injection 1 mg (1 mg Intravenous Given 04/07/20 1926)  HYDROmorphone (DILAUDID) injection 1 mg (1 mg Intravenous Given 04/07/20 2003)  oxyCODONE (Oxy IR/ROXICODONE) immediate release tablet 15 mg (15 mg Oral Given 04/07/20 2056)  ketorolac (TORADOL) 15 MG/ML injection 15 mg (15 mg Intravenous Given 04/07/20 2230)  HYDROmorphone (DILAUDID) injection 1 mg (1 mg Intravenous Given  04/07/20 2258)    ED Course  I have reviewed the triage vital signs and the nursing notes.  Pertinent labs & imaging results that were available during my care of the patient were reviewed by me and considered in my medical decision making (see chart for details).    MDM Rules/Calculators/A&P                         29 year old male presents with pain in bilateral lower extremities typical for his sickle cell crisis.  He did have some brief pain over the left-sided ribs earlier today but that has since resolved.  No associated fevers, cough or shortness of breath.  No focal joint or lower extremity swelling.  Will get screening labs, chest x-ray and EKG and start patient on sickle cell protocol with fluids, Toradol, and Dilaudid for analgesia, Benadryl and Zofran ordered as needed.  Will reevaluate after patient has had treatment of his pain.  I have independently ordered, reviewed and interpreted all labs and imaging: CBC: Hemoglobin at baseline, no leukocytosis CMP: No significant electrolyte derangements, normal renal liver function Reticulocytes: Appropriate response  EKG: Sinus rhythm without concerning changes  CXR: No active cardiopulmonary disease  On reevaluation after patient has had 2 doses of Dilaudid and p.o. oxycodone he reports improvement in his pain.  He does request 1 additional dose of pain medication but then thinks he will be able to go home and manage pain on his own.  He will contact the sickle cell center tomorrow morning if he is still struggling to manage pain at home.  He does not want to be admitted to the hospital at this time.  I think this is very reasonable will give him and 1 additional dose of pain medication for better control here and then plan for discharge home.  Return precautions discussed.   Final Clinical Impression(s) / ED Diagnoses Final diagnoses:  Sickle cell pain crisis [facility])    Rx / DC Orders ED Discharge Orders    None       Legrand Rams 04/08/20 0146    Pollyann Savoy, MD 04/08/20 [phone]

## 2020-04-07 NOTE — ED Notes (Signed)
ED Provider at bedside. 

## 2020-04-09 ENCOUNTER — Telehealth (HOSPITAL_COMMUNITY): Payer: Self-pay | Admitting: *Deleted

## 2020-04-09 ENCOUNTER — Non-Acute Institutional Stay (HOSPITAL_COMMUNITY)
Admission: AD | Admit: 2020-04-09 | Discharge: 2020-04-09 | Disposition: A | Discharge status: Home / Self Care | Payer: Medicaid Other | Source: Ambulatory Visit | Attending: Internal Medicine | Admitting: Internal Medicine

## 2020-04-09 DIAGNOSIS — M79605 Pain in left leg: Secondary | ICD-10-CM | POA: Insufficient documentation

## 2020-04-09 DIAGNOSIS — D5700 Hb-SS disease with crisis, unspecified: Secondary | ICD-10-CM | POA: Diagnosis present

## 2020-04-09 DIAGNOSIS — M79604 Pain in right leg: Secondary | ICD-10-CM | POA: Insufficient documentation

## 2020-04-09 LAB — COMPREHENSIVE METABOLIC PANEL
ALT: 20 U/L (ref 0–44)
AST: 27 U/L (ref 15–41)
Albumin: 4.3 g/dL (ref 3.5–5.0)
Alkaline Phosphatase: 93 U/L (ref 38–126)
Anion gap: 12 (ref 5–15)
BUN: 10 mg/dL (ref 6–20)
CO2: 24 mmol/L (ref 22–32)
Calcium: 9.7 mg/dL (ref 8.9–10.3)
Chloride: 105 mmol/L (ref 98–111)
Creatinine, Ser: 0.88 mg/dL (ref 0.61–1.24)
GFR, Estimated: >60 mL/min (ref >60)
Glucose, Bld: 94 mg/dL (ref 70–99)
Potassium: 4.2 mmol/L (ref 3.5–5.1)
Sodium: 141 mmol/L (ref 135–145)
Total Bilirubin: 1.7 mg/dL — ABNORMAL HIGH (ref 0.3–1.2)
Total Protein: 7.6 g/dL (ref 6.5–8.1)

## 2020-04-09 LAB — CBC WITH DIFFERENTIAL/PLATELET
Abs Immature Granulocytes: 0.04 10*3/uL (ref 0.00–0.07)
Basophils Absolute: 0 10*3/uL (ref 0.0–0.1)
Basophils Relative: 1 %
Eosinophils Absolute: 0.2 10*3/uL (ref 0.0–0.5)
Eosinophils Relative: 2 %
HCT: 33.4 % — ABNORMAL LOW (ref 39.0–52.0)
Hemoglobin: 12 g/dL — ABNORMAL LOW (ref 13.0–17.0)
Immature Granulocytes: 1 %
Lymphocytes Relative: 29 %
Lymphs Abs: 2.2 10*3/uL (ref 0.7–4.0)
MCH: 28.4 pg (ref 26.0–34.0)
MCHC: 35.9 g/dL (ref 30.0–36.0)
MCV: 79 fL — ABNORMAL LOW (ref 80.0–100.0)
Monocytes Absolute: 1 10*3/uL (ref 0.1–1.0)
Monocytes Relative: 13 %
Neutro Abs: 4.2 10*3/uL (ref 1.7–7.7)
Neutrophils Relative %: 54 %
Platelets: 163 10*3/uL (ref 150–400)
RBC: 4.23 MIL/uL (ref 4.22–5.81)
RDW: 14.5 % (ref 11.5–15.5)
WBC: 7.7 10*3/uL (ref 4.0–10.5)
nRBC: 0.5 % — ABNORMAL HIGH (ref 0.0–0.2)

## 2020-04-09 LAB — RETICULOCYTES
Immature Retic Fract: 29.9 % — ABNORMAL HIGH (ref 2.3–15.9)
RBC.: 4.36 MIL/uL (ref 4.22–5.81)
Retic Count, Absolute: 170.9 10*3/uL (ref 19.0–186.0)
Retic Ct Pct: 3.9 % — ABNORMAL HIGH (ref 0.4–3.1)

## 2020-04-09 MED ORDER — HYDROMORPHONE 1 MG/ML IV SOLN
INTRAVENOUS | Status: DC
  Administered 2020-04-09: 6.2 mg via INTRAVENOUS
  Filled 2020-04-09: qty 30

## 2020-04-09 MED ORDER — OXYCODONE HCL 5 MG PO TABS
5.0000 mg | ORAL_TABLET | Freq: Once | ORAL | Status: AC
  Administered 2020-04-09: 5 mg via ORAL
  Filled 2020-04-09: qty 1

## 2020-04-09 MED ORDER — SODIUM CHLORIDE 0.9% FLUSH: 9.0000 mL | INTRAVENOUS | Status: DC | PRN

## 2020-04-09 MED ORDER — KETOROLAC TROMETHAMINE 30 MG/ML IJ SOLN
15.0000 mg | Freq: Once | INTRAMUSCULAR | Status: AC
  Administered 2020-04-09: 15 mg via INTRAVENOUS
  Filled 2020-04-09: qty 1

## 2020-04-09 MED ORDER — OXYCODONE-ACETAMINOPHEN 5-325 MG PO TABS: 1.0000 | ORAL_TABLET | ORAL | 0 refills | Status: DC | PRN

## 2020-04-09 MED ORDER — DIPHENHYDRAMINE HCL 12.5 MG/5ML PO ELIX: 12.5000 mg | ORAL_SOLUTION | Freq: Four times a day (QID) | ORAL | Status: DC | PRN

## 2020-04-09 MED ORDER — SODIUM CHLORIDE 0.45 % IV SOLN: INTRAVENOUS | Status: DC

## 2020-04-09 MED ORDER — NALOXONE HCL 0.4 MG/ML IJ SOLN: 0.4000 mg | INTRAMUSCULAR | Status: DC | PRN

## 2020-04-09 MED ORDER — DIPHENHYDRAMINE HCL 50 MG/ML IJ SOLN: 12.5000 mg | Freq: Four times a day (QID) | INTRAMUSCULAR | Status: DC | PRN

## 2020-04-09 MED ORDER — ONDANSETRON HCL 4 MG/2ML IJ SOLN: 4.0000 mg | Freq: Four times a day (QID) | INTRAMUSCULAR | Status: DC | PRN

## 2020-04-09 MED ORDER — ACETAMINOPHEN 500 MG PO TABS
1000.0000 mg | ORAL_TABLET | Freq: Once | ORAL | Status: AC
  Administered 2020-04-09: 1000 mg via ORAL
  Filled 2020-04-09: qty 2

## 2020-04-09 NOTE — Discharge Instructions (Signed)
Sickle Cell Anemia, Adult  Sickle cell anemia is a condition where your red blood cells are shaped like sickles. Red blood cells carry oxygen through the body. Sickle-shaped cells do not live as long as normal red blood cells. They also clump together and block blood from flowing through the blood vessels. This prevents the body from getting enough oxygen. Sickle cell anemia causes organ damage and pain. It also increases the risk of infection. Follow these instructions at home: Medicines  Take over-the-counter and prescription medicines only as told by your doctor.  If you were prescribed an antibiotic medicine, take it as told by your doctor. Do not stop taking the antibiotic even if you start to feel better.  If you develop a fever, do not take medicines to lower the fever right away. Tell your doctor about the fever. Managing pain, stiffness, and swelling  Try these methods to help with pain: ? Use a heating pad. ? Take a warm bath. ? Distract yourself, such as by watching TV. Eating and drinking  Drink enough fluid to keep your pee (urine) clear or pale yellow. Drink more in hot weather and during exercise.  Limit or avoid alcohol.  Eat a healthy diet. Eat plenty of fruits, vegetables, whole grains, and lean protein.  Take vitamins and supplements as told by your doctor. Traveling  When traveling, keep these with you: ? Your medical information. ? The names of your doctors. ? Your medicines.  If you need to take an airplane, talk to your doctor first. Activity  Rest often.  Avoid exercises that make your heart beat much faster, such as jogging. General instructions  Do not use products that have nicotine or tobacco, such as cigarettes and e-cigarettes. If you need help quitting, ask your doctor.  Consider wearing a medical alert bracelet.  Avoid being in high places (high altitudes), such as mountains.  Avoid very hot or cold temperatures.  Avoid places where the  temperature changes a lot.  Keep all follow-up visits as told by your doctor. This is important. Contact a doctor if:  A joint hurts.  Your feet or hands hurt or swell.  You feel tired (fatigued). Get help right away if:  You have symptoms of infection. These include: ? Fever. ? Chills. ? Being very tired. ? Irritability. ? Poor eating. ? Throwing up (vomiting).  You feel dizzy or faint.  You have new stomach pain, especially on the left side.  You have a an erection (priapism) that lasts more than 4 hours.  You have numbness in your arms or legs.  You have a hard time moving your arms or legs.  You have trouble talking.  You have pain that does not go away when you take medicine.  You are short of breath.  You are breathing fast.  You have a long-term cough.  You have pain in your chest.  You have a bad headache.  You have a stiff neck.  Your stomach looks bloated even though you did not eat much.  Your skin is pale.  You suddenly cannot see well. Summary  Sickle cell anemia is a condition where your red blood cells are shaped like sickles.  Follow your doctor's advice on ways to manage pain, food to eat, activities to do, and steps to take for safe travel.  Get medical help right away if you have any signs of infection, such as a fever. This information is not intended to replace advice given to you by   your health care provider. Make sure you discuss any questions you have with your health care provider. Document Revised: 09/22/2018 Document Reviewed: 07/06/2016 Elsevier Patient Education  2020 Elsevier Inc.  

## 2020-04-09 NOTE — H&P (Signed)
[facility] History and Physical   Date: 04/09/2020  Patient name: William Jennings Medical record number: [id number] Date of birth: Apr 15, 1991 Age: 29 y.o. Gender: male PCP: Massie Maroon, FNP  Attending physician: Quentin Angst, MD  Chief Complaint: Sickle cell pain  History of Present Illness: William Jennings is a 29 year old male with a medical history significant for sickle cell disease, chronic pain, and history of anemia of chronic disease presents complaining of bilateral lower extremity pain that is consistent with his previous sickle cell pain crises.  Patient says that pain intensity has been elevated over the past 4 days.  He was seen in the emergency department on 04/07/2020 for this problem without complete resolution.  Patient attributes pain crisis to changes in weather.  He has been taking Percocet 5-325 mg last taken this a.m. without sustained relief.  Pain intensity is 9/10 characterized as constant and aching.  Patient denies any fever, chills, headache, urinary symptoms, nausea, vomiting, or diarrhea.  Meds: Medications Prior to Admission  Medication Sig Dispense Refill Last Dose  . folic acid (FOLVITE) 1 MG tablet Take 1 tablet (1 mg total) by mouth daily. 90 tablet 1   . ibuprofen (ADVIL) 800 MG tablet Take 1 tablet (800 mg total) by mouth every 8 (eight) hours as needed. 30 tablet 2   . oxyCODONE-acetaminophen (PERCOCET) 5-325 MG tablet Take 1 tablet by mouth every 4 (four) hours as needed for severe pain. 30 tablet 0     Allergies: Shellfish allergy Past Medical History:  Diagnosis Date  . Chronic prescription opiate use   . Sickle cell anemia (HCC)   . Sickle cell disease (HCC)    Past Surgical History:  Procedure Laterality Date  . CHOLECYSTECTOMY     Family History  Family history unknown: Yes   Social History   Socioeconomic History  . Marital status: Single    Spouse name: Not on file  . Number of children: Not on file   . Years of education: Not on file  . Highest education level: Not on file  Occupational History  . Not on file  Tobacco Use  . Smoking status: Former Games developer  . Smokeless tobacco: Never Used  Vaping Use  . Vaping Use: Never used  Substance and Sexual Activity  . Alcohol use: Yes    Comment: occ  . Drug use: No  . Sexual activity: Not on file  Other Topics Concern  . Not on file  Social History Narrative  . Not on file   Social Determinants of Health   Financial Resource Strain:   . Difficulty of Paying Living Expenses: Not on file  Food Insecurity:   . Worried About Programme researcher, broadcasting/film/video in the Last Year: Not on file  . Ran Out of Food in the Last Year: Not on file  Transportation Needs:   . Lack of Transportation (Medical): Not on file  . Lack of Transportation (Non-Medical): Not on file  Physical Activity:   . Days of Exercise per Week: Not on file  . Minutes of Exercise per Session: Not on file  Stress:   . Feeling of Stress : Not on file  Social Connections:   . Frequency of Communication with Friends and Family: Not on file  . Frequency of Social Gatherings with Friends and Family: Not on file  . Attends Religious Services: Not on file  . Active Member of Clubs or Organizations: Not on file  . Attends Club  or Organization Meetings: Not on file  . Marital Status: Not on file  Intimate Partner Violence:   . Fear of Current or Ex-Partner: Not on file  . Emotionally Abused: Not on file  . Physically Abused: Not on file  . Sexually Abused: Not on file   Review of Systems  Constitutional: Negative.   HENT: Negative.   Eyes: Negative.   Respiratory: Negative.   Cardiovascular: Negative.   Gastrointestinal: Negative.   Genitourinary: Negative.   Musculoskeletal: Positive for back pain and joint pain.  Skin: Negative.   Neurological: Negative.   Endo/Heme/Allergies: Negative.   Psychiatric/Behavioral: Negative.     Physical Exam: There were no vitals taken  for this visit. Physical Exam Constitutional:      Appearance: Normal appearance.  HENT:     Nose: Nose normal.  Eyes:     Pupils: Pupils are equal, round, and reactive to light.  Cardiovascular:     Rate and Rhythm: Normal rate and regular rhythm.     Pulses: Normal pulses.  Pulmonary:     Effort: Pulmonary effort is normal.  Abdominal:     General: Abdomen is flat. Bowel sounds are normal.  Musculoskeletal:        General: Normal range of motion.  Skin:    General: Skin is warm.  Neurological:     General: No focal deficit present.     Mental Status: He is alert. Mental status is at baseline.  Psychiatric:        Mood and Affect: Mood normal.        Behavior: Behavior normal.        Thought Content: Thought content normal.        Judgment: Judgment normal.      Lab results: No results found for this or any previous visit (from the past 24 hour(s)).  Imaging results:  DG Chest Portable 1 View  Result Date: 04/07/2020 CLINICAL DATA:  Sickle cell crisis EXAM: PORTABLE CHEST 1 VIEW COMPARISON:  08/15/2018 FINDINGS: The heart size and mediastinal contours are within normal limits. Both lungs are clear. The visualized skeletal structures are unremarkable. IMPRESSION: No active disease. Electronically Signed   By: Deatra Robinson M.D.   On: 04/07/2020 20:16     Assessment & Plan: Patient admitted to sickle cell day infusion center for management of pain crisis.  Patient is opiate naive Initiate IV dilaudid PCA per full dose IV fluids, 0.45% saline at 100 ml/hr Toradol 15 mg IV times one dose Tylenol 1000 mg by mouth times one dose Review CBC with differential, complete metabolic panel, and reticulocytes as results become available. Baseline hemoglobin is 9-10 Pain intensity will be reevaluated in context of functioning and relationship to baseline as care progress If pain intensity remains elevated and/or sudden change in hemodynamic stability transition to inpatient  services for higher level of care.      [name]  APRN, MSN, FNP-C [facility] [address] [place], [place] [address] [phone]  04/09/2020, 9:35 AM

## 2020-04-09 NOTE — Progress Notes (Signed)
Patient admitted to the day infusion hospital for sickle cell pain crisis. Initially, patient reported bilateral leg pain rated 9/10. For pain management, patient placed on Dilaudid PCA, given 15 mg Toradol, 1000 mg Tylenol, 5 mg Oxycodone and hydrated with IV fluids. At discharge, patient rated pain at 5/10. Vital signs stable. Discharge instructions given. Patient alert, oriented and ambulatory at discharge.

## 2020-04-09 NOTE — Telephone Encounter (Signed)
Patient called requesting to come to the day hospital for sickle cell pain. Patient reports bilateral leg pain rated 9/10. Reports taking Oxycodone at 6:30 am. Patient was seen in the ED on Monday for pain crisis. COVID-19 screening done and patient denies all symptoms and exposures. Denies fever, chest pain, nausea, vomiting, diarrhea, abdominal pain and priapism. Admits to having transportation without driving self. [place], FNP notified. Patient can come to the day hospital for pain management. Patient advised and expresses an understanding.

## 2020-04-09 NOTE — Discharge Summary (Addendum)
[facility] Discharge Summary   Patient ID: DANGELO GUZZETTA MRN: [id number] DOB/AGE: Sep 09, 1990 29 y.o.  Admit date: 04/09/2020 Discharge date: 04/09/2020  Primary Care Physician:  Massie Maroon, FNP  Admission Diagnoses:  Active Problems:   Sickle cell pain crisis [facility])   Discharge Medications:  Allergies as of 04/09/2020      Reactions   Shellfish Allergy Anaphylaxis      Medication List    TAKE these medications   folic acid 1 MG tablet Commonly known as: FOLVITE Take 1 tablet (1 mg total) by mouth daily.   ibuprofen 800 MG tablet Commonly known as: ADVIL Take 1 tablet (800 mg total) by mouth every 8 (eight) hours as needed.   oxyCODONE-acetaminophen 5-325 MG tablet Commonly known as: Percocet Take 1 tablet by mouth every 4 (four) hours as needed for severe pain.        Consults:  None  Significant Diagnostic Studies:  DG Chest Portable 1 View  Result Date: 04/07/2020 CLINICAL DATA:  Sickle cell crisis EXAM: PORTABLE CHEST 1 VIEW COMPARISON:  08/15/2018 FINDINGS: The heart size and mediastinal contours are within normal limits. Both lungs are clear. The visualized skeletal structures are unremarkable. IMPRESSION: No active disease. Electronically Signed   By: Deatra Robinson M.D.   On: 04/07/2020 20:16   History of present illness:  Shaiden Aldous is a 29 year old male with a medical history significant for sickle cell disease, chronic pain, and history of anemia of chronic disease presents complaining of bilateral lower extremity pain that is consistent with his previous sickle cell pain crises.  Patient says that pain intensity has been elevated over the past 4 days.  He was seen in the emergency department on 04/07/2020 for this problem without complete resolution.  Patient attributes pain crisis to changes in weather.  He has been taking Percocet 5-325 mg last taken this a.m. without sustained relief.  Pain intensity is 9/10 characterized as  constant and aching.  Patient denies any fever, chills, headache, urinary symptoms, nausea, vomiting, or diarrhea.    [facility] Course: Patient admitted to sickle cell day infusion clinic for management of pain crisis.  Reviewed all laboratory values, consistent with patient's baseline. Pain managed with IV Dilaudid PCA per full dose IV Toradol 15 mg x 1 Tylenol 1000 mg x 1 IV fluids, 0.45% saline at 150 mL/h Oxycodone 5 mg x 1 Pain intensity decreased to 5/10.  Patient does not warrant admission at this time. Mr. Tosh has been lost to follow-up with primary care.  He has had to cancel last scheduled appointments due to job constraints.  Patient is currently out of home medications.  Sent Percocet 5-325 mg #30 to patient's pharmacy.  Reviewed PDMP prior to prescribing opiate medications, no inconsistencies noted. Patient alert, oriented, and ambulating without assistance.  Patient will discharge home in a hemodynamically stable condition.  Discharge instructions: Resume all home medications.   Follow up with PCP as previously  scheduled.   Discussed the importance of drinking 64 ounces of water daily, dehydration of red blood cells may lead further sickling.   Avoid all stressors that precipitate sickle cell pain crisis.     The patient was given clear instructions to go to ER or return to medical center if symptoms do not improve, worsen or new problems develop.     Physical Exam at Discharge:  BP 112/63 (BP Location: Left Arm)   Pulse 95   Temp 99 F (  37.2 C) (Temporal)   Resp 15   SpO2 98%   Physical Exam Constitutional:      Appearance: Normal appearance.  HENT:     Mouth/Throat:     Mouth: Mucous membranes are moist.  Eyes:     Pupils: Pupils are equal, round, and reactive to light.  Cardiovascular:     Rate and Rhythm: Normal rate and regular rhythm.     Pulses: Normal pulses.  Pulmonary:     Effort: Pulmonary effort is normal.  Abdominal:      General: Abdomen is flat. Bowel sounds are normal.  Musculoskeletal:        General: Normal range of motion.  Skin:    General: Skin is warm.  Neurological:     General: No focal deficit present.     Mental Status: He is alert. Mental status is at baseline.  Psychiatric:        Mood and Affect: Mood normal.        Behavior: Behavior normal.        Thought Content: Thought content normal.        Judgment: Judgment normal.      Disposition at Discharge: Discharge disposition: 01-Home or Self Care       Discharge Orders: Discharge Instructions    Discharge patient   Complete by: As directed    Discharge disposition: 01-Home or Self Care   Discharge patient date: 04/09/2020      Condition at Discharge:   Stable  Time spent on Discharge:  Greater than 30 minutes.  Signed: [name]  APRN, MSN, FNP-C Patient Care [facility] [address] [place], [place] [address] [phone]  04/09/2020, 4:36 PM

## 2020-06-15 ENCOUNTER — Other Ambulatory Visit: Payer: Self-pay

## 2020-06-15 ENCOUNTER — Emergency Department (HOSPITAL_BASED_OUTPATIENT_CLINIC_OR_DEPARTMENT_OTHER): Payer: Medicaid Other

## 2020-06-15 ENCOUNTER — Encounter (HOSPITAL_BASED_OUTPATIENT_CLINIC_OR_DEPARTMENT_OTHER): Payer: Self-pay | Admitting: Emergency Medicine

## 2020-06-15 ENCOUNTER — Emergency Department (HOSPITAL_BASED_OUTPATIENT_CLINIC_OR_DEPARTMENT_OTHER)
Admission: EM | Admit: 2020-06-15 | Discharge: 2020-06-16 | Disposition: A | Discharge status: Home / Self Care | Payer: Medicaid Other | Attending: Emergency Medicine | Admitting: Emergency Medicine

## 2020-06-15 DIAGNOSIS — J1282 Pneumonia due to coronavirus disease 2019: Secondary | ICD-10-CM | POA: Diagnosis not present

## 2020-06-15 DIAGNOSIS — R059 Cough, unspecified: Secondary | ICD-10-CM | POA: Diagnosis present

## 2020-06-15 DIAGNOSIS — R110 Nausea: Secondary | ICD-10-CM | POA: Insufficient documentation

## 2020-06-15 DIAGNOSIS — U071 COVID-19: Secondary | ICD-10-CM | POA: Diagnosis not present

## 2020-06-15 DIAGNOSIS — Z87891 Personal history of nicotine dependence: Secondary | ICD-10-CM | POA: Insufficient documentation

## 2020-06-15 LAB — CBC
HCT: 35.8 % — ABNORMAL LOW (ref 39.0–52.0)
Hemoglobin: 13.2 g/dL (ref 13.0–17.0)
MCH: 27.4 pg (ref 26.0–34.0)
MCHC: 36.9 g/dL — ABNORMAL HIGH (ref 30.0–36.0)
MCV: 74.4 fL — ABNORMAL LOW (ref 80.0–100.0)
Platelets: 146 10*3/uL — ABNORMAL LOW (ref 150–400)
RBC: 4.81 MIL/uL (ref 4.22–5.81)
RDW: 15.8 % — ABNORMAL HIGH (ref 11.5–15.5)
WBC: 5.4 10*3/uL (ref 4.0–10.5)
nRBC: 0.9 % — ABNORMAL HIGH (ref 0.0–0.2)

## 2020-06-15 LAB — BASIC METABOLIC PANEL
Anion gap: 7 (ref 5–15)
BUN: 10 mg/dL (ref 6–20)
CO2: 22 mmol/L (ref 22–32)
Calcium: 8.9 mg/dL (ref 8.9–10.3)
Chloride: 109 mmol/L (ref 98–111)
Creatinine, Ser: 0.89 mg/dL (ref 0.61–1.24)
GFR, Estimated: >60 mL/min (ref >60)
Glucose, Bld: 95 mg/dL (ref 70–99)
Potassium: 4.1 mmol/L (ref 3.5–5.1)
Sodium: 138 mmol/L (ref 135–145)

## 2020-06-15 LAB — TROPONIN I (HIGH SENSITIVITY): Troponin I (High Sensitivity): 2 ng/L (ref <18)

## 2020-06-15 NOTE — ED Triage Notes (Signed)
Pt arrives via EMS. He is COVID+. States he is concerned due to having sickle cell. Denies symptoms of crisis.

## 2020-06-15 NOTE — ED Triage Notes (Addendum)
Reports having nausea for two day.  Unable to eat and drink.  Denies vomiting.  Also reports being diagnosed with covid a week ago.  Now having productive cough.  Reports moderate amount of phlegm.  Now wanting chest xray to ensure no pneumonia.  Later in triage c/o chest pressure as well.

## 2020-06-16 ENCOUNTER — Encounter (HOSPITAL_BASED_OUTPATIENT_CLINIC_OR_DEPARTMENT_OTHER): Payer: Self-pay

## 2020-06-16 ENCOUNTER — Emergency Department (HOSPITAL_BASED_OUTPATIENT_CLINIC_OR_DEPARTMENT_OTHER): Payer: Medicaid Other

## 2020-06-16 MED ORDER — ONDANSETRON 4 MG PO TBDP: 4.0000 mg | ORAL_TABLET | Freq: Three times a day (TID) | ORAL | 0 refills | Status: DC | PRN

## 2020-06-16 MED ORDER — IOHEXOL 350 MG/ML SOLN
100.0000 mL | Freq: Once | INTRAVENOUS | Status: AC | PRN
  Administered 2020-06-16: 100 mL via INTRAVENOUS

## 2020-06-16 MED ORDER — ONDANSETRON HCL 4 MG/2ML IJ SOLN
4.0000 mg | Freq: Once | INTRAMUSCULAR | Status: AC
  Administered 2020-06-16: 4 mg via INTRAVENOUS
  Filled 2020-06-16: qty 2

## 2020-06-16 NOTE — ED Notes (Signed)
Attempted PIV twice, unsuccessful; primary RN informed, pt tolerated well

## 2020-06-16 NOTE — ED Provider Notes (Addendum)
[facility] EMERGENCY DEPARTMENT Provider Note   CSN: [id number] Arrival date & time: 06/15/20  1549     History Chief Complaint  Patient presents with  . Cough  . Nausea    William Jennings is a 30 y.o. male.  HPI     This is a 30 year old male with a history of sickle cell anemia who presents with upper respiratory symptoms, nausea, cough.  Patient reports he has had several weeks of symptoms.  He tested positive for COVID-19 on 12/28.  He has developed a productive cough.  Reports ongoing temperatures at home up to 101.  He also has been having some nausea.  He states he is having some sharp and pressure-like chest pain that is often worse with breathing.  Currently it is 3 out of 10.  He does not feel that he is having an acute crisis.  Patient is not vaccinated against COVID-19  Past Medical History:  Diagnosis Date  . Chronic prescription opiate use   . Sickle cell anemia (HCC)   . Sickle cell disease [facility])     Patient Active Problem List   Diagnosis Date Noted  . Cough   . Chronic pain syndrome 08/14/2018  . Anemia of chronic disease 08/14/2018  . Sickle cell pain crisis (HCC) 05/29/2018  . Sickle cell anemia with pain (HCC) 04/05/2016  . Acute chest syndrome due to sickle cell crisis (HCC)   . Sickle cell anemia with crisis (HCC) 02/21/2014  . Sickle cell anemia (HCC) 08/10/2013  . Chest pain 08/10/2013  . Dehydration 08/08/2013  . Sickle cell crisis (HCC) 08/06/2013  . 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 Fever 06/29/2012  . Leukocytosis 06/29/2012    Past Surgical History:  Procedure Laterality Date  . CHOLECYSTECTOMY         Family History  Family history unknown: Yes    Social History   Tobacco Use  . Smoking status: Former Games developer  . Smokeless tobacco: Never Used  Vaping Use  . Vaping Use: Never used  Substance Use Topics  . Alcohol use: Yes    Comment: occ  . Drug use:  No    Home Medications Prior to Admission medications   Medication Sig Start Date End Date Taking? Authorizing Provider  ondansetron (ZOFRAN ODT) 4 MG disintegrating tablet Take 1 tablet (4 mg total) by mouth every 8 (eight) hours as needed. 06/16/20  Yes Caryle Helgeson, Mayer Masker, MD  folic acid (FOLVITE) 1 MG tablet Take 1 tablet (1 mg total) by mouth daily. 03/06/19   Massie Maroon, FNP  ibuprofen (ADVIL) 800 MG tablet Take 1 tablet (800 mg total) by mouth every 8 (eight) hours as needed. 11/26/19   Barbette Merino, NP  oxyCODONE-acetaminophen (PERCOCET) 5-325 MG tablet Take 1 tablet by mouth every 4 (four) hours as needed for severe pain. 04/09/20   Massie Maroon, FNP    Allergies    Shellfish allergy  Review of Systems   Review of Systems  Constitutional: Positive for chills and fever.  Respiratory: Positive for cough. Negative for shortness of breath.   Cardiovascular: Positive for chest pain.  Gastrointestinal: Positive for nausea. Negative for abdominal pain and vomiting.  Genitourinary: Negative for dysuria.  All other systems reviewed and are negative.   Physical Exam Updated Vital Signs BP 118/72   Pulse 92   Temp 97.6 F (36.4 C) (Oral)   Resp (!) 26   Ht  1.727 m (5\' 8" )   Wt 68.9 kg   SpO2 99%   BMI 23.11 kg/m   Physical Exam Vitals and nursing note reviewed.  Constitutional:      Appearance: He is well-developed and well-nourished. He is not ill-appearing.  HENT:     Head: Normocephalic and atraumatic.     Nose: Nose normal.     Mouth/Throat:     Mouth: Mucous membranes are moist.  Eyes:     Pupils: Pupils are equal, round, and reactive to light.  Cardiovascular:     Rate and Rhythm: Normal rate and regular rhythm.     Heart sounds: Normal heart sounds. No murmur heard.   Pulmonary:     Effort: Pulmonary effort is normal. No respiratory distress.     Breath sounds: Normal breath sounds. No wheezing.  Abdominal:     General: Bowel sounds are normal.      Palpations: Abdomen is soft.     Tenderness: There is no abdominal tenderness. There is no rebound.  Musculoskeletal:        General: No edema.     Cervical back: Neck supple.     Right lower leg: No edema.     Left lower leg: No edema.  Lymphadenopathy:     Cervical: No cervical adenopathy.  Skin:    General: Skin is warm and dry.  Neurological:     Mental Status: He is alert and oriented to person, place, and time.  Psychiatric:        Mood and Affect: Mood and affect and mood normal.     ED Results / Procedures / Treatments   Labs (all labs ordered are listed, but only abnormal results are displayed) Labs Reviewed  CBC - Abnormal; Notable for the following components:      Result Value   HCT 35.8 (*)    MCV 74.4 (*)    MCHC 36.9 (*)    RDW 15.8 (*)    Platelets 146 (*)    nRBC 0.9 (*)    All other components within normal limits  BASIC METABOLIC PANEL  TROPONIN I (HIGH SENSITIVITY)  TROPONIN I (HIGH SENSITIVITY)    EKG EKG Interpretation  Date/Time:  Sunday June 15 2020 16:20:14 EST Ventricular Rate:  69 PR Interval:  130 QRS Duration: 78 QT Interval:  374 QTC Calculation: 400 R Axis:   13 Text Interpretation: Normal sinus rhythm with sinus arrhythmia Nonspecific T wave abnormality Abnormal ECG Confirmed by 10-25-1987 (Ross Marcus) on 06/15/2020 11:11:22 PM   Radiology CT Angio Chest PE W and/or Wo Contrast  Result Date: 06/16/2020 CLINICAL DATA:  Nausea, COVID positive EXAM: CT ANGIOGRAPHY CHEST WITH CONTRAST TECHNIQUE: Multidetector CT imaging of the chest was performed using the standard protocol during bolus administration of intravenous contrast. Multiplanar CT image reconstructions and MIPs were obtained to evaluate the vascular anatomy. CONTRAST:  08/14/2020 OMNIPAQUE IOHEXOL 350 MG/ML SOLN COMPARISON:  None. FINDINGS: Cardiovascular: There is a optimal opacification of the pulmonary arteries. There is no central,segmental, or subsegmental filling defects  within the pulmonary arteries. The heart is normal in size. No pericardial effusion or thickening. No evidence right heart strain. There is normal three-vessel brachiocephalic anatomy without proximal stenosis. The thoracic aorta is normal in appearance. Mediastinum/Nodes: No hilar, mediastinal, or axillary adenopathy. Thyroid gland, trachea, and esophagus demonstrate no significant findings. Lungs/Pleura: Multifocal patchy airspace opacities are seen throughout both lungs, predominantly through the right lung base. No pleural effusion or pneumothorax. Upper Abdomen: No acute abnormalities  present in the visualized portions of the upper abdomen. Musculoskeletal: Patchy areas of sclerosis seen throughout the osseous structures. There is H-shaped vertebra consistent with the patient's history of sickle cell. Review of the MIP images confirms the above findings. IMPRESSION: No central, segmental, or subsegmental pulmonary embolism. Multifocal patchy airspace opacities predominantly within the right lung base, consistent with atypical viral pneumonia. Electronically Signed   By: Jonna Clark M.D.   On: 06/16/2020 01:20   DG Chest Port 1 View  Result Date: 06/15/2020 CLINICAL DATA:  COVID positive EXAM: PORTABLE CHEST 1 VIEW COMPARISON:  None. FINDINGS: The heart size and mediastinal contours are within normal limits. Hazy airspace opacity seen within the right mid lung and periphery of the left lung. there appears to be sclerosis of the bilateral humeral heads unchanged from multiple prior exams. IMPRESSION: Mild hazy airspace opacity seen within bilateral lungs, which could be due to infectious etiology Electronically Signed   By: Jonna Clark M.D.   On: 06/15/2020 19:39    Procedures Procedures (including critical care time)  Medications Ordered in ED Medications  iohexol (OMNIPAQUE) 350 MG/ML injection 100 mL (100 mLs Intravenous Contrast Given 06/16/20 0102)  ondansetron (ZOFRAN) injection 4 mg (4 mg  Intravenous Given 06/16/20 0054)    ED Course  I have reviewed the triage vital signs and the nursing notes.  Pertinent labs & imaging results that were available during my care of the patient were reviewed by me and considered in my medical decision making (see chart for details).  Clinical Course as of 06/16/20 9381  Sheral Flow Jun 16, 2020  0221 Was called into the patient's room as the nurse stated that he had some questions regarding his discharge.  Patient seemed very frustrated and angry.  He stated that he was not told any of his results and follow-up plan.  I discussed with him that I felt his work-up was most consistent with viral Covid pneumonia.  Given that his vital signs are reassuring, he is not hypoxic or in respiratory distress, and his chest x-ray does not show a consolidated pneumonia, have a low suspicion for acute chest.  I had also already sent a text to the monoclonal antibody clinic for follow-up for the patient.  Patient continued to express concerns.  I discussed with him my reasoning and then offered for him that given that he does have sickle cell anemia, I could call to attempt admission for observation for acute chest.  Patient declined and stated he wanted to follow-up with his doctor.  He continued to request "a resolution."  I tried to explain to him that there was not any specific treatment for COVID-19.  The important thing that he would have to monitor his symptoms and if he worsened he would need to be reevaluated immediately.  Patient continued to yell and expressed frustration.  I apologized for his long wait and any miscommunication and removed myself from the situation. [CH]    Clinical Course User Index [CH] Ezri Landers, Mayer Masker, MD   MDM Rules/Calculators/A&P                          Patient presents with ongoing upper respiratory symptoms and chest pain.  Recent diagnosis of COVID-19.  He is overall nontoxic.  He is afebrile.  He is satting 100% on room air.  He is  in no respiratory distress.  Considerations include but not limited to insect with layer of COVID-19, community-acquired pneumonia,  acute chest, PE.  Labs reviewed from triage.  Hemoglobin and troponin are reassuring.  EKG is without acute ischemic changes.  Chest x-ray shows changes consistent with viral pneumonia.  There is no lobular consolidation.  Have lower suspicion for acute chest.  CT PE study was obtained to rule out PE.  This again showed changes consistent with atypical viral pneumonia consistent with COVID-19.  I discussed the results with the patient.  At this time I have suspicion that most of his symptoms are related to COVID-19 infection.  I do not feel his symptoms are consistent with acute chest at this time.  Does not appear septic and low suspicion for bacteremia.  We did discuss that he was high risk.  Will refer to the monoclonal antibody clinic.  After history, exam, and medical workup I feel the patient has been appropriately medically screened and is safe for discharge home. Pertinent diagnoses were discussed with the patient. Patient was given return precautions.  Final Clinical Impression(s) / ED Diagnoses Final diagnoses:  Pneumonia due to COVID-19 virus    Rx / DC Orders ED Discharge Orders         Ordered    ondansetron (ZOFRAN ODT) 4 MG disintegrating tablet  Every 8 hours PRN        06/16/20 0219           Merryl Hacker, MD 06/16/20 0157    Merryl Hacker, MD 06/16/20 [phone]

## 2020-06-16 NOTE — Discharge Instructions (Signed)
You were seen today for symptoms of COVID-19.  Your chest x-ray is consistent with COVID-19.  It is less consistent with acute chest.  Monitor your symptoms closely.  You would likely qualify for monoclonal antibody therapy.  You will be referred to therapy clinic.

## 2020-06-16 NOTE — ED Notes (Addendum)
Attempted to discharge pt. Pt requested EDP to come back. EDP returned to answer questions, Pt was loud and using profanity. States we didn't do anything for him and we are sending him home the same way he came in. EDP and this nurse attempted to explain that he was known Covid+ with Sickle cell as a possable complication. Lab work and Xray was preformed with results constant with Covid. As a precaution and due to pt sickle cell status a CT was preformed to assess for blood clots. The xray and ct results were constant with Covid pneumonia. Pt questioned if pneumonia was from sickle cell. EDP stated it was  constant with Covid and the pt's presentation and pain was not consistent with his typical cell signs and symptoms by his account during primary assessment. EDP ask pt how she could meet his expectations. Pt stated he was leaving to follow up with his primary and would file a complaint. EDP offered follow up and to admit for observation. Pt declined.

## 2020-06-17 ENCOUNTER — Telehealth (INDEPENDENT_AMBULATORY_CARE_PROVIDER_SITE_OTHER): Payer: Medicaid Other | Admitting: Family Medicine

## 2020-06-17 ENCOUNTER — Other Ambulatory Visit: Payer: Self-pay

## 2020-06-17 DIAGNOSIS — U071 COVID-19: Secondary | ICD-10-CM | POA: Diagnosis not present

## 2020-06-17 DIAGNOSIS — R053 Chronic cough: Secondary | ICD-10-CM

## 2020-06-17 DIAGNOSIS — D5700 Hb-SS disease with crisis, unspecified: Secondary | ICD-10-CM

## 2020-06-17 DIAGNOSIS — J1282 Pneumonia due to coronavirus disease 2019: Secondary | ICD-10-CM

## 2020-06-17 DIAGNOSIS — E860 Dehydration: Secondary | ICD-10-CM

## 2020-06-17 DIAGNOSIS — R630 Anorexia: Secondary | ICD-10-CM

## 2020-06-17 MED ORDER — ALBUTEROL SULFATE HFA 108 (90 BASE) MCG/ACT IN AERS: 2.0000 | INHALATION_SPRAY | Freq: Four times a day (QID) | RESPIRATORY_TRACT | 0 refills | Status: DC | PRN

## 2020-06-17 MED ORDER — AZITHROMYCIN 250 MG PO TABS: ORAL_TABLET | ORAL | 0 refills | Status: DC

## 2020-06-17 NOTE — Progress Notes (Signed)
[facility]  Virtual Visit via Telephone Note  I connected with [name] on 06/17/20 at  2:20 PM EST by telephone and verified that I am speaking with the correct person using two identifiers.  Location: Patient: Home Provider: [address] [place], [place]   I discussed the limitations, risks, security and privacy concerns of performing an evaluation and management service by telephone and the availability of in person appointments. I also discussed with the patient that there may be a patient responsible charge related to this service. The patient expressed understanding and agreed to proceed.   History of Present Illness: William Jennings is a 30 year old male with a medical history significant for sickle cell disease, chronic pain syndrome, and history of anemia of chronic disease presents via telephone complaining of persistent cough, chest pain, shortness of breath, fatigue, poor appetite, and diaphoresis.  Patient was diagnosed with COVID-19 infection 1 week ago.  Patient reported to the emergency department on yesterday for this problem and CT scan showed multifocal patchy airspace opacities predominantly within the right lung base which was consistent with an atypical viral pneumonia.  Patient states that symptoms have been worsening since that time.  He has not attempted any over-the-counter interventions to assist with these problems.  Patient states that he has been periodically stepping outside in order to "catch his breath".  He endorses headache, chest pain, and shortness of breath.  Patient also reports generalized pain that is consistent with previous sickle cell pain crisis.  Pain intensity is 7/10.  Reviewed all laboratory values.  Hemoglobin is around 13.2, which is improved from patient's baseline.  All other laboratory values were unremarkable.  Patient was discharged from the emergency department with ondansetron for  nausea.  Past Medical History:  Diagnosis Date  . Chronic prescription opiate use   . Sickle cell anemia (HCC)   . Sickle cell disease (HCC)    Social History   Socioeconomic History  . Marital status: Single    Spouse name: Not on file  . Number of children: Not on file  . Years of education: Not on file  . Highest education level: Not on file  Occupational History  . Not on file  Tobacco Use  . Smoking status: Former Games developer  . Smokeless tobacco: Never Used  Vaping Use  . Vaping Use: Never used  Substance and Sexual Activity  . Alcohol use: Yes    Comment: occ  . Drug use: No  . Sexual activity: Not on file  Other Topics Concern  . Not on file  Social History Narrative  . Not on file   Social Determinants of Health   Financial Resource Strain: Not on file  Food Insecurity: Not on file  Transportation Needs: Not on file  Physical Activity: Not on file  Stress: Not on file  Social Connections: Not on file  Intimate Partner Violence: Not on file   Allergies  Allergen Reactions  . Shellfish Allergy Anaphylaxis   Observations/Objective:   Assessment and Plan: 1. Pneumonia due to COVID-19 virus Patient may benefit from receiving monoclonal antibody, will contact clinic. Also due to history of acute chest syndrome, start a trial of azithromycin for total of 5 days. Albuterol inhaler 2 puffs every 4 hours for persistent coughing, wheezing, and/or shortness of breath. - azithromycin (ZITHROMAX) 250 MG tablet; Take 500 mg today, on days 2-5 250 mg  Dispense: 6 tablet; Refill: 0 - albuterol (VENTOLIN  HFA) 108 (90 Base) MCG/ACT inhaler; Inhale 2 puffs into the lungs every 6 (six) hours as needed for wheezing or shortness of breath.  Dispense: 8 g; Refill: 0 Recommend over-the-counter zinc, continue folic acid, and elderberry. 2. Persistent cough Increase fluid intake to about 64 ounces per day.   3. Poor appetite Recommend attempting broth, protein shakes,  popsicles, etc.  Patient expressed understanding.  4. Dehydration Again, hydrate with 64 ounces of fluid per day.  Also, increase hydration will assist in averting sickle cell pain crisis.   The patient was given clear instructions to go to ER or return to medical center if symptoms do not improve, worsen or new problems develop. The patient verbalized understanding.    Follow Up Instructions:    I discussed the assessment and treatment plan with the patient. The patient was provided an opportunity to ask questions and all were answered. The patient agreed with the plan and demonstrated an understanding of the instructions.   The patient was advised to call back or seek an in-person evaluation if the symptoms worsen or if the condition fails to improve as anticipated.  I provided 10 minutes of non-face-to-face time during this encounter.   [name]  APRN, MSN, FNP-C Patient Care [facility] [address] [place], [place] [address] [phone]

## 2020-07-04 ENCOUNTER — Other Ambulatory Visit: Payer: Self-pay | Admitting: Family Medicine

## 2020-07-04 DIAGNOSIS — J1282 Pneumonia due to coronavirus disease 2019: Secondary | ICD-10-CM

## 2020-10-02 ENCOUNTER — Ambulatory Visit: Payer: Self-pay | Admitting: Nurse Practitioner

## 2020-10-02 ENCOUNTER — Ambulatory Visit: Payer: Medicaid Other | Admitting: Nurse Practitioner

## 2020-10-09 ENCOUNTER — Telehealth: Payer: Self-pay

## 2020-10-09 ENCOUNTER — Telehealth: Payer: Self-pay | Admitting: Nurse Practitioner

## 2020-10-09 ENCOUNTER — Other Ambulatory Visit: Payer: Self-pay | Admitting: Family Medicine

## 2020-10-09 DIAGNOSIS — D5700 Hb-SS disease with crisis, unspecified: Secondary | ICD-10-CM

## 2020-10-09 MED ORDER — OXYCODONE-ACETAMINOPHEN 5-325 MG PO TABS: 1.0000 | ORAL_TABLET | ORAL | 0 refills | Status: DC | PRN

## 2020-10-09 MED ORDER — IBUPROFEN 800 MG PO TABS: 800.0000 mg | ORAL_TABLET | Freq: Three times a day (TID) | ORAL | 2 refills | Status: DC | PRN

## 2020-10-09 NOTE — Telephone Encounter (Signed)
Med refill ::  Oxycodone and Ibprofen 800 mg

## 2020-10-09 NOTE — Progress Notes (Signed)
Reviewed PDMP substance reporting system prior to prescribing opiate medications. No inconsistencies noted.   Meds ordered this encounter  Medications  . oxyCODONE-acetaminophen (PERCOCET) 5-325 MG tablet    Sig: Take 1 tablet by mouth every 4 (four) hours as needed for severe pain.    Dispense:  30 tablet    Refill:  0    Order Specific Question:   Supervising Provider    Answer:   Quentin Angst [id number]  . ibuprofen (ADVIL) 800 MG tablet    Sig: Take 1 tablet (800 mg total) by mouth every 8 (eight) hours as needed.    Dispense:  30 tablet    Refill:  2    Order Specific Question:   Supervising Provider    Answer:   Quentin Angst [[id number]]     [name]  APRN, MSN, FNP-C Patient Care [facility] [address] [place], [place] [address] [phone]

## 2020-10-16 ENCOUNTER — Ambulatory Visit: Payer: Self-pay | Admitting: Nurse Practitioner

## 2020-11-05 ENCOUNTER — Other Ambulatory Visit: Payer: Self-pay

## 2020-11-05 ENCOUNTER — Ambulatory Visit (INDEPENDENT_AMBULATORY_CARE_PROVIDER_SITE_OTHER): Payer: No Typology Code available for payment source | Admitting: Nurse Practitioner

## 2020-11-05 ENCOUNTER — Encounter: Payer: Self-pay | Admitting: Nurse Practitioner

## 2020-11-05 VITALS — BP 103/72 | HR 71 | Temp 98.1°F | Ht 68.0 in | Wt 151.1 lb

## 2020-11-05 DIAGNOSIS — R310 Gross hematuria: Secondary | ICD-10-CM

## 2020-11-05 DIAGNOSIS — R319 Hematuria, unspecified: Secondary | ICD-10-CM | POA: Diagnosis not present

## 2020-11-05 LAB — POCT URINALYSIS DIPSTICK
Bilirubin, UA: NEGATIVE
Blood, UA: NEGATIVE
Glucose, UA: NEGATIVE
Ketones, UA: NEGATIVE
Leukocytes, UA: NEGATIVE
Nitrite, UA: NEGATIVE
Protein, UA: NEGATIVE
Spec Grav, UA: 1.025 (ref 1.010–1.025)
Urobilinogen, UA: 2 E.U./dL — AB
pH, UA: 7 (ref 5.0–8.0)

## 2020-11-05 NOTE — Progress Notes (Signed)
[facility] Patient [facility] [address] [place], [place]  [address] Phone:  [phone]   Fax:  [phone]   Acute Office Visit  Subjective:    Patient ID: William Jennings, male    DOB: 02-20-1991, 30 y.o.   MRN: [id number]  Chief Complaint  Patient presents with  . Hematuria    First noticed 1 month ago. No longer seeing nay blood but Having some bloating     HPI Patient is in today for hematuria. He  has a past medical history of Chronic prescription opiate use, Sickle cell anemia (HCC), and Sickle cell disease (HCC).   Hematuria Patient complains of gross hematuria. Onset of hematuria was 1 months ago and was sudden in onset. There is not a history of nephrolithiasis. There is not a history of urologic trauma. Other urologic symptoms include no pain but balder relief. Patient admits to history of no risk factors for cancer. Patient denies history of chronic Foley catheter, GU surgeries, occupational exposure, sexually transmitted diseases, tobacco use and trauma. Prior workup has been none.  Past Medical History:  Diagnosis Date  . Chronic prescription opiate use   . Sickle cell anemia (HCC)   . Sickle cell disease (HCC)     Past Surgical History:  Procedure Laterality Date  . CHOLECYSTECTOMY      Family History  Family history unknown: Yes    Social History   Socioeconomic History  . Marital status: Single    Spouse name: Not on file  . Number of children: Not on file  . Years of education: Not on file  . Highest education level: Not on file  Occupational History  . Not on file  Tobacco Use  . Smoking status: Former Games developer  . Smokeless tobacco: Never Used  Vaping Use  . Vaping Use: Never used  Substance and Sexual Activity  . Alcohol use: Yes    Comment: occ  . Drug use: No  . Sexual activity: Not on file  Other Topics Concern  . Not on file  Social History Narrative  . Not on file   Social Determinants of Health   Financial Resource  Strain: Not on file  Food Insecurity: Not on file  Transportation Needs: Not on file  Physical Activity: Not on file  Stress: Not on file  Social Connections: Not on file  Intimate Partner Violence: Not on file    Outpatient Medications Prior to Visit  Medication Sig Dispense Refill  . albuterol (VENTOLIN HFA) 108 (90 Base) MCG/ACT inhaler Inhale 2 puffs into the lungs every 6 (six) hours as needed for wheezing or shortness of breath. 8 g 0  . folic acid (FOLVITE) 1 MG tablet Take 1 tablet (1 mg total) by mouth daily. 90 tablet 1  . ibuprofen (ADVIL) 800 MG tablet Take 1 tablet (800 mg total) by mouth every 8 (eight) hours as needed. 30 tablet 2  . oxyCODONE-acetaminophen (PERCOCET) 5-325 MG tablet Take 1 tablet by mouth every 4 (four) hours as needed for severe pain. 30 tablet 0  . azithromycin (ZITHROMAX) 250 MG tablet Take 500 mg today, on days 2-5 250 mg 6 tablet 0  . ondansetron (ZOFRAN ODT) 4 MG disintegrating tablet Take 1 tablet (4 mg total) by mouth every 8 (eight) hours as needed. 20 tablet 0   No facility-administered medications prior to visit.    Allergies  Allergen Reactions  . Shellfish Allergy Anaphylaxis    Review of Systems  Objective:    Physical Exam Constitutional:      Appearance: He is normal weight.  HENT:     Head: Normocephalic and atraumatic.  Cardiovascular:     Rate and Rhythm: Normal rate and regular rhythm.     Pulses: Normal pulses.     Heart sounds: Normal heart sounds.  Pulmonary:     Effort: Pulmonary effort is normal.     Breath sounds: Normal breath sounds.  Abdominal:     Palpations: Abdomen is soft.     Tenderness: There is no right CVA tenderness.  Musculoskeletal:        General: Normal range of motion.     Cervical back: Normal range of motion.  Skin:    General: Skin is warm.     Capillary Refill: Capillary refill takes less than 2 seconds.  Neurological:     General: No focal deficit present.     Mental Status: He is  alert and oriented to person, place, and time.  Psychiatric:        Mood and Affect: Mood normal.        Behavior: Behavior normal.        Thought Content: Thought content normal.        Judgment: Judgment normal.     BP 103/72 (BP Location: Right Arm, Patient Position: Sitting)   Pulse 71   Temp 98.1 F (36.7 C)   Ht 5\' 8"  (1.727 m)   Wt 151 lb 0.8 oz (68.5 kg)   SpO2 100%   BMI 22.97 kg/m  Wt Readings from Last 3 Encounters:  11/05/20 151 lb 0.8 oz (68.5 kg)  06/15/20 152 lb (68.9 kg)  02/25/20 155 lb (70.3 kg)    Health Maintenance Due  Topic Date Due  . COVID-19 Vaccine (1) Never done    There are no preventive care reminders to display for this patient.   No results found for: TSH Lab Results  Component Value Date   WBC 5.4 06/15/2020   HGB 13.2 06/15/2020   HCT 35.8 (L) 06/15/2020   MCV 74.4 (L) 06/15/2020   PLT 146 (L) 06/15/2020   Lab Results  Component Value Date   NA 138 06/15/2020   K 4.1 06/15/2020   CO2 22 06/15/2020   GLUCOSE 95 06/15/2020   BUN 10 06/15/2020   CREATININE 0.89 06/15/2020   BILITOT 1.7 (H) 04/09/2020   ALKPHOS 93 04/09/2020   AST 27 04/09/2020   ALT 20 04/09/2020   PROT 7.6 04/09/2020   ALBUMIN 4.3 04/09/2020   CALCIUM 8.9 06/15/2020   ANIONGAP 7 06/15/2020   No results found for: CHOL No results found for: HDL No results found for: LDLCALC No results found for: TRIG No results found for: CHOLHDL No results found for: 08/13/2020     Assessment & Plan:   Problem List Items Addressed This Visit   None   Visit Diagnoses    Hematuria, unspecified type    -  Primary   Relevant Orders   Urinalysis Dipstick (Completed)   Comp. Metabolic Panel (12)   Gross hematuria     Encourage water and cranberry juice  Culture pending also with labs Additional Treament on hold for   Relevant Orders   Chlamydia/Gonococcus/Trichomonas, NAA   CBC with Differential/Platelet   Microalbumin, urine   Urine Culture       No orders  of the defined types were placed in this encounter.    [id number], NP

## 2020-11-05 NOTE — Patient Instructions (Addendum)

## 2020-11-05 NOTE — Progress Notes (Signed)
neg

## 2020-11-06 LAB — COMP. METABOLIC PANEL (12)
AST: 21 IU/L (ref 0–40)
Albumin/Globulin Ratio: 1.6 (ref 1.2–2.2)
Albumin: 4.5 g/dL (ref 4.0–5.0)
Alkaline Phosphatase: 111 IU/L (ref 44–121)
BUN/Creatinine Ratio: 7 — ABNORMAL LOW (ref 9–20)
BUN: 7 mg/dL (ref 6–20)
Bilirubin Total: 1.5 mg/dL — ABNORMAL HIGH (ref 0.0–1.2)
Calcium: 9.3 mg/dL (ref 8.7–10.2)
Chloride: 104 mmol/L (ref 96–106)
Creatinine, Ser: 0.97 mg/dL (ref 0.76–1.27)
Globulin, Total: 2.8 g/dL (ref 1.5–4.5)
Glucose: 78 mg/dL (ref 65–99)
Potassium: 4.6 mmol/L (ref 3.5–5.2)
Sodium: 140 mmol/L (ref 134–144)
Total Protein: 7.3 g/dL (ref 6.0–8.5)
eGFR: 106 mL/min/{1.73_m2} (ref >59)

## 2020-11-06 LAB — CBC WITH DIFFERENTIAL/PLATELET
Basophils Absolute: 0 10*3/uL (ref 0.0–0.2)
Basos: 1 %
EOS (ABSOLUTE): 0.1 10*3/uL (ref 0.0–0.4)
Eos: 1 %
Hematocrit: 38.3 % (ref 37.5–51.0)
Hemoglobin: 13 g/dL (ref 13.0–17.7)
Immature Grans (Abs): 0 10*3/uL (ref 0.0–0.1)
Immature Granulocytes: 1 %
Lymphocytes Absolute: 2.1 10*3/uL (ref 0.7–3.1)
Lymphs: 50 %
MCH: 28.4 pg (ref 26.6–33.0)
MCHC: 33.9 g/dL (ref 31.5–35.7)
MCV: 84 fL (ref 79–97)
Monocytes Absolute: 0.6 10*3/uL (ref 0.1–0.9)
Monocytes: 14 %
NRBC: 2 % — ABNORMAL HIGH (ref 0–0)
Neutrophils Absolute: 1.3 10*3/uL — ABNORMAL LOW (ref 1.4–7.0)
Neutrophils: 33 %
Platelets: 196 10*3/uL (ref 150–450)
RBC: 4.58 x10E6/uL (ref 4.14–5.80)
RDW: 14.5 % (ref 11.6–15.4)
WBC: 4.1 10*3/uL (ref 3.4–10.8)

## 2020-11-07 LAB — URINE CULTURE: Organism ID, Bacteria: NO GROWTH

## 2020-11-07 LAB — CHLAMYDIA/GONOCOCCUS/TRICHOMONAS, NAA
Chlamydia by NAA: NEGATIVE
Gonococcus by NAA: NEGATIVE
Trich vag by NAA: NEGATIVE

## 2020-11-07 LAB — MICROALBUMIN, URINE: Microalbumin, Urine: <3 ug/mL

## 2020-12-01 ENCOUNTER — Telehealth (HOSPITAL_COMMUNITY): Payer: Self-pay | Admitting: *Deleted

## 2020-12-01 NOTE — Telephone Encounter (Signed)
Patient called requesting to come to the day hospital for sickle cell pain. Due to staffing, the day hospital is unable to accept sickle cell patient's today. Patient advised to continue to take pain medications as prescribed and call back to the day hospital in the morning if pain remains uncontrolled. Patient expresses an understanding.   

## 2020-12-16 ENCOUNTER — Other Ambulatory Visit: Payer: Self-pay

## 2020-12-16 ENCOUNTER — Emergency Department (HOSPITAL_BASED_OUTPATIENT_CLINIC_OR_DEPARTMENT_OTHER)
Admission: EM | Admit: 2020-12-16 | Discharge: 2020-12-17 | Disposition: A | Discharge status: Home / Self Care | Payer: No Typology Code available for payment source | Attending: Emergency Medicine | Admitting: Emergency Medicine

## 2020-12-16 ENCOUNTER — Encounter (HOSPITAL_BASED_OUTPATIENT_CLINIC_OR_DEPARTMENT_OTHER): Payer: Self-pay | Admitting: *Deleted

## 2020-12-16 DIAGNOSIS — Z87891 Personal history of nicotine dependence: Secondary | ICD-10-CM | POA: Insufficient documentation

## 2020-12-16 DIAGNOSIS — D5700 Hb-SS disease with crisis, unspecified: Secondary | ICD-10-CM | POA: Diagnosis not present

## 2020-12-16 LAB — CBC WITH DIFFERENTIAL/PLATELET
Abs Immature Granulocytes: 0.04 10*3/uL (ref 0.00–0.07)
Basophils Absolute: 0 10*3/uL (ref 0.0–0.1)
Basophils Relative: 0 %
Eosinophils Absolute: 0.2 10*3/uL (ref 0.0–0.5)
Eosinophils Relative: 2 %
HCT: 31.7 % — ABNORMAL LOW (ref 39.0–52.0)
Hemoglobin: 11.9 g/dL — ABNORMAL LOW (ref 13.0–17.0)
Immature Granulocytes: 0 %
Lymphocytes Relative: 39 %
Lymphs Abs: 4 10*3/uL (ref 0.7–4.0)
MCH: 29.1 pg (ref 26.0–34.0)
MCHC: 37.5 g/dL — ABNORMAL HIGH (ref 30.0–36.0)
MCV: 77.5 fL — ABNORMAL LOW (ref 80.0–100.0)
Monocytes Absolute: 1 10*3/uL (ref 0.1–1.0)
Monocytes Relative: 10 %
Neutro Abs: 5 10*3/uL (ref 1.7–7.7)
Neutrophils Relative %: 49 %
Platelets: 191 10*3/uL (ref 150–400)
RBC: 4.09 MIL/uL — ABNORMAL LOW (ref 4.22–5.81)
RDW: 14 % (ref 11.5–15.5)
WBC: 10.2 10*3/uL (ref 4.0–10.5)
nRBC: 1.2 % — ABNORMAL HIGH (ref 0.0–0.2)

## 2020-12-16 LAB — COMPREHENSIVE METABOLIC PANEL
ALT: 15 U/L (ref 0–44)
AST: 25 U/L (ref 15–41)
Albumin: 4.2 g/dL (ref 3.5–5.0)
Alkaline Phosphatase: 92 U/L (ref 38–126)
Anion gap: 6 (ref 5–15)
BUN: 10 mg/dL (ref 6–20)
CO2: 25 mmol/L (ref 22–32)
Calcium: 8.8 mg/dL — ABNORMAL LOW (ref 8.9–10.3)
Chloride: 106 mmol/L (ref 98–111)
Creatinine, Ser: 0.89 mg/dL (ref 0.61–1.24)
GFR, Estimated: >60 mL/min (ref >60)
Glucose, Bld: 113 mg/dL — ABNORMAL HIGH (ref 70–99)
Potassium: 3.6 mmol/L (ref 3.5–5.1)
Sodium: 137 mmol/L (ref 135–145)
Total Bilirubin: 1.4 mg/dL — ABNORMAL HIGH (ref 0.3–1.2)
Total Protein: 7.3 g/dL (ref 6.5–8.1)

## 2020-12-16 LAB — RETICULOCYTES
Immature Retic Fract: 34.1 % — ABNORMAL HIGH (ref 2.3–15.9)
RBC.: 4.09 MIL/uL — ABNORMAL LOW (ref 4.22–5.81)
Retic Count, Absolute: 0.2 10*3/uL — ABNORMAL LOW (ref 19.0–186.0)
Retic Ct Pct: 4.2 % — ABNORMAL HIGH (ref 0.4–3.1)

## 2020-12-16 MED ORDER — HYDROMORPHONE HCL 1 MG/ML IJ SOLN
1.0000 mg | Freq: Once | INTRAMUSCULAR | Status: AC
  Administered 2020-12-16: 1 mg via INTRAVENOUS
  Filled 2020-12-16: qty 1

## 2020-12-16 MED ORDER — SODIUM CHLORIDE 0.9 % IV BOLUS
1000.0000 mL | Freq: Once | INTRAVENOUS | Status: AC
  Administered 2020-12-16: 1000 mL via INTRAVENOUS

## 2020-12-16 MED ORDER — DIPHENHYDRAMINE HCL 50 MG/ML IJ SOLN
25.0000 mg | Freq: Once | INTRAMUSCULAR | Status: AC
  Administered 2020-12-16: 25 mg via INTRAVENOUS
  Filled 2020-12-16: qty 1

## 2020-12-16 MED ORDER — KETOROLAC TROMETHAMINE 15 MG/ML IJ SOLN
15.0000 mg | Freq: Once | INTRAMUSCULAR | Status: AC
  Administered 2020-12-16: 15 mg via INTRAVENOUS
  Filled 2020-12-16: qty 1

## 2020-12-16 MED ORDER — ONDANSETRON HCL 4 MG/2ML IJ SOLN
4.0000 mg | Freq: Once | INTRAMUSCULAR | Status: AC
  Administered 2020-12-16: 4 mg via INTRAVENOUS
  Filled 2020-12-16: qty 2

## 2020-12-16 MED ORDER — OXYCODONE-ACETAMINOPHEN 5-325 MG PO TABS: 1.0000 | ORAL_TABLET | ORAL | 0 refills | Status: DC | PRN

## 2020-12-16 NOTE — ED Provider Notes (Signed)
[facility] EMERGENCY DEPARTMENT Provider Note   CSN: [id number] Arrival date & time: 12/16/20  2207     History Chief Complaint  Patient presents with   Sickle Cell Pain Crisis    William Jennings is a 30 y.o. male.  The history is provided by the patient.  Sickle Cell Pain Crisis Location:  Back (legs) Severity:  Mild Onset quality:  Gradual Duration:  2 days Similar to previous crisis episodes: yes   Timing:  Constant Progression:  Unchanged Chronicity:  New Context: not non-compliance   Relieved by:  Nothing Worsened by:  Nothing Associated symptoms: no chest pain, no congestion, no cough, no fatigue, no fever, no headaches, no leg ulcers, no nausea, no priapism, no shortness of breath, no sore throat, no swelling of legs, no vision change, no vomiting and no wheezing   Risk factors: no frequent admissions for pain and no frequent pain crises       Past Medical History:  Diagnosis Date   Chronic prescription opiate use    Sickle cell anemia (HCC)    Sickle cell disease (HCC)     Patient Active Problem List   Diagnosis Date Noted   Cough    Chronic pain syndrome 08/14/2018   Anemia of chronic disease 08/14/2018   Sickle cell pain crisis (HCC) 05/29/2018   Sickle cell anemia with pain (HCC) 04/05/2016   Acute chest syndrome due to sickle cell crisis (HCC)    Sickle cell anemia with crisis (HCC) 02/21/2014   Sickle cell anemia (HCC) 08/10/2013   Chest pain 08/10/2013   Dehydration 08/08/2013   Sickle cell crisis (HCC) 08/06/2013   Sickle-cell/Hb-C disease with crisis (HCC) 01/22/2013   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 Authorizing Provider  albuterol (VENTOLIN HFA) 108 (90 Base) MCG/ACT inhaler Inhale 2 puffs into the lungs every 6 (six) hours as needed for wheezing or shortness of breath. 06/17/20   Massie Maroon, FNP  folic acid (FOLVITE) 1 MG tablet Take 1 tablet (1 mg total) by mouth daily. 03/06/19   Massie Maroon, FNP  ibuprofen (ADVIL) 800 MG tablet Take 1 tablet (800 mg total) by mouth every 8 (eight) hours as needed. 10/09/20   Massie Maroon, FNP  oxyCODONE-acetaminophen (PERCOCET) 5-325 MG tablet Take 1 tablet by mouth every 4 (four) hours as needed for up to 20 doses for severe pain. 12/16/20   Jariana Shumard, DO    Allergies    Shellfish allergy  Review of Systems   Review of Systems  Constitutional:  Negative for chills, fatigue and fever.  HENT:  Negative for congestion, ear pain and sore throat.   Eyes:  Negative for pain and visual disturbance.  Respiratory:  Negative for cough, shortness of breath and wheezing.   Cardiovascular:  Negative for chest pain and palpitations.  Gastrointestinal:  Negative for abdominal pain, nausea  and vomiting.  Genitourinary:  Negative for dysuria and hematuria.  Musculoskeletal:  Positive for arthralgias. Negative for back pain.  Skin:  Negative for color change and rash.  Neurological:  Negative for seizures, syncope and headaches.  All other systems reviewed and are negative.  Physical Exam Updated Vital Signs  ED Triage Vitals  Enc Vitals Group     BP 12/16/20 2236 99/64     Pulse Rate 12/16/20 2236 87     Resp 12/16/20 2236 (!) 6     Temp 12/16/20 2236 (!) 97.5 F (36.4 C)     Temp Source 12/16/20 2236 Oral     SpO2 12/16/20 2236 99 %     Weight 12/16/20 2234 155 lb (70.3 kg)     Height 12/16/20 2234 5\' 8"  (1.727 m)     Head Circumference --      Peak Flow --       Pain Score 12/16/20 2234 8     Pain Loc --      Pain Edu? --      Excl. in GC? --     Physical Exam Vitals and nursing note reviewed.  Constitutional:      General: He is not in acute distress.    Appearance: He is well-developed. He is not ill-appearing.  HENT:     Head: Normocephalic and atraumatic.     Nose: Nose normal.     Mouth/Throat:     Mouth: Mucous membranes are moist.  Eyes:     Extraocular Movements: Extraocular movements intact.     Conjunctiva/sclera: Conjunctivae normal.     Pupils: Pupils are equal, round, and reactive to light.  Cardiovascular:     Rate and Rhythm: Normal rate and regular rhythm.     Pulses: Normal pulses.     Heart sounds: No murmur heard. Pulmonary:     Effort: Pulmonary effort is normal. No respiratory distress.     Breath sounds: Normal breath sounds.  Abdominal:     Palpations: Abdomen is soft.     Tenderness: There is no abdominal tenderness.  Musculoskeletal:     Cervical back: Neck supple.  Skin:    General: Skin is warm and dry.  Neurological:     General: No focal deficit present.     Mental Status: He is alert.    ED Results / Procedures / Treatments   Labs (all labs ordered are listed, but only abnormal results are displayed) Labs Reviewed  CBC WITH DIFFERENTIAL/PLATELET  COMPREHENSIVE METABOLIC PANEL  RETICULOCYTES    EKG None  Radiology No results found.  Procedures Procedures   Medications Ordered in ED Medications  sodium chloride 0.9 % bolus 1,000 mL (has no administration in time range)  HYDROmorphone (DILAUDID) injection 1 mg (has no administration in time range)  ondansetron (ZOFRAN) injection 4 mg (has no administration in time range)  diphenhydrAMINE (BENADRYL) injection 25 mg (has no administration in time range)  ketorolac (TORADOL) 15 MG/ML injection 15 mg (has no administration in time range)    ED Course  I have reviewed the triage vital signs and the nursing notes.  Pertinent labs  & imaging results that were available during my care of the patient were reviewed by me and considered in my medical decision making (see chart for details).    MDM Rules/Calculators/A&P                          William Jennings is  here with sickle cell pain crisis.  Normal vitals.  No fever.  Pain mostly in his legs and his back similar to prior pain crisis.  Does not use narcotics often but does not have a current prescription.  Has been using over-the-counter medications with minimal relief.  Overall he appears well.  We will check basic labs, give him a dose of Toradol, Dilaudid, Zofran, Benadryl.  Overall he feels that he will be able to do well if he had some narcotic medicine for breakthrough pain at home.  Has not had a prescription active since April.  Does not have frequent pain crisis and is overall well controlled at home with over-the-counter medications.  Overall patient appears well we will look to see if there is any significant anemia.  Patient handed off to oncoming ED staff with patient pending blood work, reevaluation after pain control.  I have sent narcotic pain medication to his pharmacy.    Please see oncoming ED providers note for further results, evaluation, disposition of the patient.  This chart was dictated using voice recognition software.  Despite best efforts to proofread,  errors can occur which can change the documentation meaning.   Final Clinical Impression(s) / ED Diagnoses Final diagnoses:  Sickle cell pain crisis [facility])    Rx / DC Orders ED Discharge Orders          Ordered    oxyCODONE-acetaminophen (PERCOCET) 5-325 MG tablet  Every 4 hours PRN        12/16/20 2245             Virgina Norfolk, DO 12/16/20 2250

## 2020-12-16 NOTE — ED Triage Notes (Signed)
C/o " sickle cell crisis " c/o back pain and hip pain  , reports out of hydrocodone

## 2020-12-17 MED ORDER — HYDROMORPHONE HCL 1 MG/ML IJ SOLN
1.0000 mg | Freq: Once | INTRAMUSCULAR | Status: AC
Start: 2020-12-17 — End: 2020-12-17
  Administered 2020-12-17: 1 mg via INTRAVENOUS
  Filled 2020-12-17: qty 1

## 2021-02-02 ENCOUNTER — Other Ambulatory Visit: Payer: Self-pay

## 2021-02-02 ENCOUNTER — Encounter (HOSPITAL_BASED_OUTPATIENT_CLINIC_OR_DEPARTMENT_OTHER): Payer: Self-pay

## 2021-02-02 ENCOUNTER — Emergency Department (HOSPITAL_BASED_OUTPATIENT_CLINIC_OR_DEPARTMENT_OTHER)
Admission: EM | Admit: 2021-02-02 | Discharge: 2021-02-02 | Disposition: A | Discharge status: Home / Self Care | Payer: No Typology Code available for payment source | Attending: Emergency Medicine | Admitting: Emergency Medicine

## 2021-02-02 DIAGNOSIS — D5700 Hb-SS disease with crisis, unspecified: Secondary | ICD-10-CM | POA: Insufficient documentation

## 2021-02-02 DIAGNOSIS — Z87891 Personal history of nicotine dependence: Secondary | ICD-10-CM | POA: Diagnosis not present

## 2021-02-02 LAB — CBC WITH DIFFERENTIAL/PLATELET
Abs Immature Granulocytes: 0.02 10*3/uL (ref 0.00–0.07)
Basophils Absolute: 0 10*3/uL (ref 0.0–0.1)
Basophils Relative: 0 %
Eosinophils Absolute: 0 10*3/uL (ref 0.0–0.5)
Eosinophils Relative: 0 %
HCT: 37.4 % — ABNORMAL LOW (ref 39.0–52.0)
Hemoglobin: 13.8 g/dL (ref 13.0–17.0)
Immature Granulocytes: 0 %
Lymphocytes Relative: 43 %
Lymphs Abs: 3 10*3/uL (ref 0.7–4.0)
MCH: 28.4 pg (ref 26.0–34.0)
MCHC: 36.9 g/dL — ABNORMAL HIGH (ref 30.0–36.0)
MCV: 77 fL — ABNORMAL LOW (ref 80.0–100.0)
Monocytes Absolute: 0.7 10*3/uL (ref 0.1–1.0)
Monocytes Relative: 10 %
Neutro Abs: 3.2 10*3/uL (ref 1.7–7.7)
Neutrophils Relative %: 47 %
Platelets: 212 10*3/uL (ref 150–400)
RBC: 4.86 MIL/uL (ref 4.22–5.81)
RDW: 14.2 % (ref 11.5–15.5)
WBC: 6.9 10*3/uL (ref 4.0–10.5)
nRBC: 1.9 % — ABNORMAL HIGH (ref 0.0–0.2)

## 2021-02-02 LAB — BASIC METABOLIC PANEL
Anion gap: 8 (ref 5–15)
BUN: 7 mg/dL (ref 6–20)
CO2: 27 mmol/L (ref 22–32)
Calcium: 9.5 mg/dL (ref 8.9–10.3)
Chloride: 102 mmol/L (ref 98–111)
Creatinine, Ser: 0.88 mg/dL (ref 0.61–1.24)
GFR, Estimated: >60 mL/min (ref >60)
Glucose, Bld: 96 mg/dL (ref 70–99)
Potassium: 4 mmol/L (ref 3.5–5.1)
Sodium: 137 mmol/L (ref 135–145)

## 2021-02-02 LAB — RETICULOCYTES
Immature Retic Fract: 31.3 % — ABNORMAL HIGH (ref 2.3–15.9)
RBC.: 4.86 MIL/uL (ref 4.22–5.81)
Retic Count, Absolute: 193.4 10*3/uL — ABNORMAL HIGH (ref 19.0–186.0)
Retic Ct Pct: 4 % — ABNORMAL HIGH (ref 0.4–3.1)

## 2021-02-02 MED ORDER — ONDANSETRON 4 MG PO TBDP
4.0000 mg | ORAL_TABLET | Freq: Once | ORAL | Status: AC
  Administered 2021-02-02: 4 mg via ORAL
  Filled 2021-02-02: qty 1

## 2021-02-02 MED ORDER — HYDROMORPHONE HCL 1 MG/ML IJ SOLN
0.5000 mg | INTRAMUSCULAR | Status: AC
  Administered 2021-02-02: 0.5 mg via INTRAVENOUS
  Filled 2021-02-02: qty 1

## 2021-02-02 MED ORDER — DIPHENHYDRAMINE HCL 25 MG PO CAPS
25.0000 mg | ORAL_CAPSULE | Freq: Once | ORAL | Status: AC
  Administered 2021-02-02: 25 mg via ORAL
  Filled 2021-02-02: qty 1

## 2021-02-02 MED ORDER — OXYCODONE-ACETAMINOPHEN 10-325 MG PO TABS: 1.0000 | ORAL_TABLET | ORAL | 0 refills | Status: AC | PRN

## 2021-02-02 MED ORDER — HYDROMORPHONE HCL 1 MG/ML IJ SOLN
1.0000 mg | INTRAMUSCULAR | Status: AC
  Administered 2021-02-02: 1 mg via INTRAVENOUS
  Filled 2021-02-02: qty 1

## 2021-02-02 MED ORDER — KETOROLAC TROMETHAMINE 15 MG/ML IJ SOLN
15.0000 mg | Freq: Once | INTRAMUSCULAR | Status: AC
  Administered 2021-02-02: 15 mg via INTRAVENOUS
  Filled 2021-02-02: qty 1

## 2021-02-02 MED ORDER — DIPHENHYDRAMINE HCL 12.5 MG/5ML PO ELIX
12.5000 mg | ORAL_SOLUTION | Freq: Once | ORAL | Status: AC
  Administered 2021-02-02: 12.5 mg via ORAL
  Filled 2021-02-02: qty 10

## 2021-02-02 NOTE — ED Triage Notes (Signed)
Pt c/o sickle cell crisis since Saturday. Pain in legs. Taking ibuprofen and percocet. Last percocet at 0500.

## 2021-02-02 NOTE — ED Provider Notes (Signed)
[facility] EMERGENCY DEPARTMENT Provider Note   CSN: [id number] Arrival date & time: 02/02/21  1146     History Chief Complaint  Patient presents with   Sickle Cell Pain Crisis    William Jennings is a 30 y.o. male with a past medical history of sickle cell, his last pain crisis was 2 to 3 months ago who is presenting with a new pain crisis in his right leg and left leg.  He reports the pain started on Friday and was precipitated by weather changes.  He reports the pain is on his right leg on the lateral aspect extending from the hip bone down to 2 cm distal to the knee.  He also reports pain on the left leg that is isolated to his hip bone.  Patient reports he is usually able to control his pain crises at home with ibuprofen and Percocet 5 mg.  He reports his last admission for pain crisis was 2 years ago.  Patient reports he does not take his opioids other than when he is having a pain crisis has had approximately 5 doses of 5 mg Percocet since this current crisis began.  He denies chest pain, shortness of breath, abdominal pain, pain in the hands or the feet.  Patient denies nausea, headache.  He reports he has had an appetite, and been able to tolerate plenty of fluids.  He does not take hydroxyurea at home.  Patient sought evaluation today because his pain was uncontrolled with his home medications, he rates it an 8/10 during our interview.   Sickle Cell Pain Crisis Associated symptoms: no chest pain, no fatigue, no fever, no headaches and no shortness of breath       Past Medical History:  Diagnosis Date   Chronic prescription opiate use    Sickle cell anemia (HCC)    Sickle cell disease (HCC)     Patient Active Problem List   Diagnosis Date Noted   Cough    Chronic pain syndrome 08/14/2018   Anemia of chronic disease 08/14/2018   Sickle cell pain crisis (HCC) 05/29/2018   Sickle cell anemia with pain (HCC) 04/05/2016   Acute chest syndrome due to sickle cell  crisis (HCC)    Sickle cell anemia with crisis (HCC) 02/21/2014   Sickle cell anemia (HCC) 08/10/2013   Chest pain 08/10/2013   Dehydration 08/08/2013   Sickle cell crisis (HCC) 08/06/2013   Sickle-cell/Hb-C disease with crisis (HCC) 01/22/2013   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 Authorizing Provider  albuterol (VENTOLIN HFA) 108 (90 Base) MCG/ACT inhaler Inhale 2 puffs into the lungs every 6 (six) hours as needed for wheezing or shortness of breath. 06/17/20   Massie Maroon, FNP  folic acid (FOLVITE) 1 MG tablet Take 1 tablet (1 mg total) by mouth daily. 03/06/19   Massie Maroon, FNP  ibuprofen (ADVIL) 800 MG tablet Take 1 tablet (  800 mg total) by mouth every 8 (eight) hours as needed. 10/09/20   Massie Maroon, FNP  oxyCODONE-acetaminophen (PERCOCET) 5-325 MG tablet Take 1 tablet by mouth every 4 (four) hours as needed for up to 20 doses for severe pain. 12/16/20   Curatolo, Adam, DO    Allergies    Shellfish allergy  Review of Systems   Review of Systems  Constitutional:  Negative for appetite change, diaphoresis, fatigue and fever.  Respiratory:  Negative for chest tightness and shortness of breath.   Cardiovascular:  Negative for chest pain and palpitations.  Genitourinary:  Negative for difficulty urinating, dysuria and flank pain.  Musculoskeletal:  Negative for joint swelling, neck pain and neck stiffness.  Neurological:  Negative for headaches.  All other systems reviewed and are  negative.  Physical Exam Updated Vital Signs BP 114/78 (BP Location: Right Arm)   Pulse 80   Temp 98.4 F (36.9 C) (Oral)   Resp 20   Ht 5\' 8"  (1.727 m)   Wt 68.7 kg   SpO2 98%   BMI 23.03 kg/m   Physical Exam Vitals and nursing note reviewed.  Constitutional:      Appearance: He is well-developed.  HENT:     Head: Normocephalic and atraumatic.  Eyes:     Conjunctiva/sclera: Conjunctivae normal.  Cardiovascular:     Rate and Rhythm: Normal rate and regular rhythm.     Heart sounds: No murmur heard. Pulmonary:     Effort: Pulmonary effort is normal. No respiratory distress.     Breath sounds: Normal breath sounds.  Abdominal:     Palpations: Abdomen is soft.     Tenderness: There is no abdominal tenderness.     Comments: No hepatosplenomegaly appreciated  Musculoskeletal:     Cervical back: Neck supple.     Comments: Patient is tender to palpation along the bony prominences of the right hip, right femur, and right knee.  He is tender to palpation along the bony prominences of the left hip.  He also reports a small amount of tenderness to palpation of the right foot not apparent to him until the exam.  Skin:    General: Skin is warm and dry.  Neurological:     Mental Status: He is alert.    ED Results / Procedures / Treatments   Labs (all labs ordered are listed, but only abnormal results are displayed) Labs Reviewed  CBC WITH DIFFERENTIAL/PLATELET - Abnormal; Notable for the following components:      Result Value   HCT 37.4 (*)    MCV 77.0 (*)    MCHC 36.9 (*)    nRBC 1.9 (*)    All other components within normal limits  RETICULOCYTES - Abnormal; Notable for the following components:   Retic Ct Pct 4.0 (*)    Retic Count, Absolute 193.4 (*)    Immature Retic Fract 31.3 (*)    All other components within normal limits  BASIC METABOLIC PANEL    EKG None  Radiology No results found.  Procedures Procedures   Medications Ordered in ED Medications   HYDROmorphone (DILAUDID) injection 0.5 mg (0.5 mg Intravenous Given 02/02/21 1348)  HYDROmorphone (DILAUDID) injection 1 mg (1 mg Intravenous Given 02/02/21 1431)  ondansetron (ZOFRAN-ODT) disintegrating tablet 4 mg (4 mg Oral Given 02/02/21 1312)  diphenhydrAMINE (BENADRYL) capsule 25 mg (25 mg Oral Given 02/02/21 1312)  ketorolac (TORADOL) 15 MG/ML injection 15 mg (15 mg Intravenous Given 02/02/21 1311)  diphenhydrAMINE (BENADRYL) 12.5 MG/5ML elixir 12.5  mg (12.5 mg Oral Given 02/02/21 1321)    ED Course  I have reviewed the triage vital signs and the nursing notes.  Pertinent labs & imaging results that were available during my care of the patient were reviewed by me and considered in my medical decision making (see chart for details).    MDM Rules/Calculators/A&P                         Overall well-appearing.  No symptoms of acute chest crisis noted, no shortness of breath.  Pain consistent with sickle cell crisis.  Lab work is reassuring hemoglobin stable at 13.8.  Elevated reticulocyte count, no evidence of aplastic anemia.  Pain under much better control with Dilaudid, Toradol, Benadryl.  Patient now rates pain at a 4/10. Patient feels like this pain will be manageable with at home regimen, not interested in admission at this time.  Discussed giving a new prescription of oxycodone 10 mg, encouraged hydration, and rest.  Patient given extensive return precautions, including new onset shortness of breath or chest pain. Final Clinical Impression(s) / ED Diagnoses Final diagnoses:  None    Rx / DC Orders ED Discharge Orders     None        Olene Floss, PA-C 02/02/21 1510    Virgina Norfolk, DO 02/03/21 [phone]

## 2021-02-03 ENCOUNTER — Telehealth: Payer: Self-pay

## 2021-02-03 NOTE — Telephone Encounter (Signed)
Oxycodone  Ibuprofen  

## 2021-05-02 ENCOUNTER — Encounter (HOSPITAL_BASED_OUTPATIENT_CLINIC_OR_DEPARTMENT_OTHER): Payer: Self-pay | Admitting: Emergency Medicine

## 2021-05-02 ENCOUNTER — Other Ambulatory Visit: Payer: Self-pay

## 2021-05-02 ENCOUNTER — Emergency Department (HOSPITAL_BASED_OUTPATIENT_CLINIC_OR_DEPARTMENT_OTHER)
Admission: EM | Admit: 2021-05-02 | Discharge: 2021-05-02 | Disposition: A | Discharge status: Home / Self Care | Payer: No Typology Code available for payment source | Attending: Emergency Medicine | Admitting: Emergency Medicine

## 2021-05-02 DIAGNOSIS — D5700 Hb-SS disease with crisis, unspecified: Secondary | ICD-10-CM | POA: Insufficient documentation

## 2021-05-02 DIAGNOSIS — Z87891 Personal history of nicotine dependence: Secondary | ICD-10-CM | POA: Diagnosis not present

## 2021-05-02 LAB — CBC WITH DIFFERENTIAL/PLATELET
Abs Immature Granulocytes: 0.02 10*3/uL (ref 0.00–0.07)
Basophils Absolute: 0 10*3/uL (ref 0.0–0.1)
Basophils Relative: 0 %
Eosinophils Absolute: 0 10*3/uL (ref 0.0–0.5)
Eosinophils Relative: 0 %
HCT: 34.1 % — ABNORMAL LOW (ref 39.0–52.0)
Hemoglobin: 12.7 g/dL — ABNORMAL LOW (ref 13.0–17.0)
Immature Granulocytes: 0 %
Lymphocytes Relative: 35 %
Lymphs Abs: 2.8 10*3/uL (ref 0.7–4.0)
MCH: 29 pg (ref 26.0–34.0)
MCHC: 37.2 g/dL — ABNORMAL HIGH (ref 30.0–36.0)
MCV: 77.9 fL — ABNORMAL LOW (ref 80.0–100.0)
Monocytes Absolute: 0.7 10*3/uL (ref 0.1–1.0)
Monocytes Relative: 8 %
Neutro Abs: 4.5 10*3/uL (ref 1.7–7.7)
Neutrophils Relative %: 57 %
Platelets: 184 10*3/uL (ref 150–400)
RBC: 4.38 MIL/uL (ref 4.22–5.81)
RDW: 13.9 % (ref 11.5–15.5)
Smear Review: NORMAL
WBC Morphology: >10
WBC: 8 10*3/uL (ref 4.0–10.5)
nRBC: 1 % — ABNORMAL HIGH (ref 0.0–0.2)

## 2021-05-02 LAB — RETICULOCYTES
Immature Retic Fract: 34.4 % — ABNORMAL HIGH (ref 2.3–15.9)
RBC.: 4.37 MIL/uL (ref 4.22–5.81)
Retic Count, Absolute: 148.5 10*3/uL (ref 19.0–186.0)
Retic Ct Pct: 3.4 % — ABNORMAL HIGH (ref 0.4–3.1)

## 2021-05-02 LAB — COMPREHENSIVE METABOLIC PANEL
ALT: 21 U/L (ref 0–44)
AST: 34 U/L (ref 15–41)
Albumin: 4.6 g/dL (ref 3.5–5.0)
Alkaline Phosphatase: 74 U/L (ref 38–126)
Anion gap: 8 (ref 5–15)
BUN: 8 mg/dL (ref 6–20)
CO2: 24 mmol/L (ref 22–32)
Calcium: 9 mg/dL (ref 8.9–10.3)
Chloride: 104 mmol/L (ref 98–111)
Creatinine, Ser: 0.84 mg/dL (ref 0.61–1.24)
GFR, Estimated: >60 mL/min (ref >60)
Glucose, Bld: 99 mg/dL (ref 70–99)
Potassium: 3.8 mmol/L (ref 3.5–5.1)
Sodium: 136 mmol/L (ref 135–145)
Total Bilirubin: 1.4 mg/dL — ABNORMAL HIGH (ref 0.3–1.2)
Total Protein: 7.6 g/dL (ref 6.5–8.1)

## 2021-05-02 MED ORDER — OXYCODONE-ACETAMINOPHEN 5-325 MG PO TABS
1.0000 | ORAL_TABLET | Freq: Once | ORAL | Status: AC
  Administered 2021-05-02: 1 via ORAL
  Filled 2021-05-02: qty 1

## 2021-05-02 MED ORDER — HYDROMORPHONE HCL 1 MG/ML IJ SOLN
1.0000 mg | Freq: Once | INTRAMUSCULAR | Status: AC
  Administered 2021-05-02: 1 mg via INTRAMUSCULAR
  Filled 2021-05-02: qty 1

## 2021-05-02 NOTE — ED Provider Notes (Signed)
[facility] EMERGENCY DEPARTMENT Provider Note   CSN: [id number] Arrival date & time: 05/02/21  2055     History Chief Complaint  Patient presents with   Sickle Cell Pain Crisis    William Jennings is a 30 y.o. male.  With past medical history of sickle cell anemia who presents emergency department with sickle cell pain crisis.  He states for 2 days he has had intermittent, throbbing left arm pain.  He states that he has tried his ibuprofen 800 mg as prescribed and is also taken oral hydrocodone.  He states that the pain is not subsiding.  He believes the change in weather has triggered this crisis.  He denies any numbness or tingling in the arm, discoloration of his arm or weakness.  Denies redness or swelling of the shoulder, elbow or wrist.  Denies chest pain or shortness of breath.  Denies fevers.  Denies recent injury to the arm.   Sickle Cell Pain Crisis Associated symptoms: no fever       Past Medical History:  Diagnosis Date   Chronic prescription opiate use    Sickle cell anemia (HCC)    Sickle cell disease (HCC)     Patient Active Problem List   Diagnosis Date Noted   Cough    Chronic pain syndrome 08/14/2018   Anemia of chronic disease 08/14/2018   Sickle cell pain crisis (HCC) 05/29/2018   Sickle cell anemia with pain (HCC) 04/05/2016   Acute chest syndrome due to sickle cell crisis (HCC)    Sickle cell anemia with crisis (HCC) 02/21/2014   Sickle cell anemia (HCC) 08/10/2013   Chest pain 08/10/2013   Dehydration 08/08/2013   Sickle cell crisis (HCC) 08/06/2013   Sickle-cell/Hb-C disease with crisis (HCC) 01/22/2013   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 Authorizing Provider  albuterol (VENTOLIN HFA) 108 (90 Base) MCG/ACT inhaler Inhale 2 puffs into the lungs every 6 (six) hours as needed for wheezing or shortness of breath. 06/17/20   Massie Maroon, FNP  folic acid (FOLVITE) 1 MG tablet Take 1 tablet (1 mg total) by mouth daily. 03/06/19   Massie Maroon, FNP  ibuprofen (ADVIL) 800 MG tablet Take 1 tablet (800 mg total) by mouth every 8 (eight) hours as needed. 10/09/20   Massie Maroon, FNP    Allergies    Shellfish allergy  Review of Systems   Review of Systems  Constitutional:  Negative for fever.  Musculoskeletal:  Positive for myalgias. Negative for joint swelling.  Neurological:  Negative for weakness and numbness.  All other systems reviewed and are negative.  Physical Exam Updated Vital Signs BP 116/74 (BP Location: Right Arm)   Pulse 83   Temp 98.2 F (36.8 C) (Oral)   Resp 18   Ht 5\' 8"  (1.727 m)   Wt 65.8 kg   SpO2 94%   BMI 22.05 kg/m  Physical Exam Vitals and nursing note reviewed.  Constitutional:      General: He is not in acute distress.    Appearance: Normal appearance. He is not ill-appearing or toxic-appearing.  HENT:     Head: Normocephalic and atraumatic.  Eyes:     General: No scleral icterus. Cardiovascular:     Rate and Rhythm: Normal rate and regular rhythm.     Pulses: Normal pulses.     Heart sounds: No murmur heard. Pulmonary:     Effort: Pulmonary effort is normal. No respiratory distress.  Abdominal:     Palpations: Abdomen is soft.  Musculoskeletal:        General: Tenderness present. No swelling, deformity or signs of injury. Normal range of motion.     Cervical back: Normal range of motion and neck supple. No tenderness.     Comments: Tenderness to palpation of the left shoulder.  There is no  redness or swelling or warmth.  Pulses 2+ bilaterally.  Sensation intact bilaterally.  Strength 5/5.  Skin:    General: Skin is warm and dry.     Capillary Refill: Capillary refill takes less than 2 seconds.     Findings: No rash.  Neurological:     General: No focal deficit present.     Mental Status: He is alert and oriented to person, place, and time. Mental status is at baseline.     Sensory: No sensory deficit.     Motor: No weakness.  Psychiatric:        Mood and Affect: Mood normal.        Behavior: Behavior normal.        Thought Content: Thought content normal.        Judgment: Judgment normal.    ED Results / Procedures / Treatments   Labs (all labs ordered are listed, but only abnormal results are displayed) Labs Reviewed  CBC WITH DIFFERENTIAL/PLATELET - Abnormal; Notable for the following components:      Result Value   Hemoglobin 12.7 (*)    HCT 34.1 (*)    MCV 77.9 (*)    MCHC 37.2 (*)    nRBC 1.0 (*)    All other components within normal limits  COMPREHENSIVE METABOLIC PANEL - Abnormal; Notable for the following components:   Total Bilirubin 1.4 (*)    All other components within normal limits  RETICULOCYTES - Abnormal; Notable for the following components:   Retic Ct Pct 3.4 (*)    Immature Retic Fract 34.4 (*)    All other components within normal limits   EKG None  Radiology No results found.  Procedures Procedures   Medications Ordered in ED Medications  oxyCODONE-acetaminophen (PERCOCET/ROXICET) 5-325 MG per tablet 1 tablet (has no administration in time range)  HYDROmorphone (DILAUDID) injection 1 mg (1 mg Intramuscular Given 05/02/21 2122)  HYDROmorphone (DILAUDID) injection 1 mg (1 mg Intramuscular Given 05/02/21 2254)    ED Course  I have reviewed the triage vital signs and the nursing notes.  Pertinent labs & imaging results that were available during my care of the patient were reviewed by me and considered in my medical decision  making (see chart for details).    MDM Rules/Calculators/A&P 30 year old male presents emergency department with left arm pain related to sickle cell.  The arm is warm and dry.  Pulses 2+ bilateral radial.  Full range of motion in bilateral upper extremities.  Strength intact.  Sensation intact.  No evidence of ischemic limb. He does not  complain of chest pain, doubt ACS. No other complaints other than the left upper arm. Given 2 doses of IM Dilaudid with improvement in pain.  Given oral dose of Percocet for transition home to home regimen.  He has ride home. He states that his pain is usually a 4-5/10 at baseline, which his pain is improved to here in the emergency department.  He feels ready to go home at this time. Sickle cell labs all similar to previous. Safe for discharge. Final Clinical Impression(s) / ED Diagnoses Final diagnoses:  Sickle cell pain crisis [facility])    Rx / DC Orders ED Discharge Orders     None        Cristopher Peru, PA-C 05/02/21 2322    Virgina Norfolk, DO 05/03/21 1545

## 2021-05-02 NOTE — ED Triage Notes (Signed)
Sickle cell pain in LUE x 2d

## 2021-05-02 NOTE — ED Notes (Signed)
Pt verbalizes understanding of discharge instructions. Opportunity for questioning and answers were provided. Pt discharged from ED to home.   ? ?

## 2021-05-02 NOTE — Discharge Instructions (Addendum)
You were seen in the Emergency Department today for sickle cell pain.  While you are here we gave you some pain medication which improved your symptoms.  Please continue to your home regimen after your discharge.  Please return to emergency department if you have chest pain or shortness of breath, abdominal pain with her sickle cell.

## 2021-05-04 ENCOUNTER — Emergency Department (HOSPITAL_BASED_OUTPATIENT_CLINIC_OR_DEPARTMENT_OTHER): Payer: No Typology Code available for payment source

## 2021-05-04 ENCOUNTER — Encounter (HOSPITAL_BASED_OUTPATIENT_CLINIC_OR_DEPARTMENT_OTHER): Payer: Self-pay

## 2021-05-04 ENCOUNTER — Other Ambulatory Visit: Payer: Self-pay

## 2021-05-04 ENCOUNTER — Emergency Department (HOSPITAL_BASED_OUTPATIENT_CLINIC_OR_DEPARTMENT_OTHER)
Admission: EM | Admit: 2021-05-04 | Discharge: 2021-05-04 | Disposition: A | Discharge status: Home / Self Care | Payer: No Typology Code available for payment source | Attending: Student | Admitting: Student

## 2021-05-04 DIAGNOSIS — M79603 Pain in arm, unspecified: Secondary | ICD-10-CM | POA: Diagnosis present

## 2021-05-04 DIAGNOSIS — D5709 Hb-ss disease with crisis with other specified complication: Secondary | ICD-10-CM

## 2021-05-04 DIAGNOSIS — D5700 Hb-SS disease with crisis, unspecified: Secondary | ICD-10-CM | POA: Diagnosis not present

## 2021-05-04 DIAGNOSIS — Z87891 Personal history of nicotine dependence: Secondary | ICD-10-CM | POA: Diagnosis not present

## 2021-05-04 DIAGNOSIS — Z7951 Long term (current) use of inhaled steroids: Secondary | ICD-10-CM | POA: Insufficient documentation

## 2021-05-04 LAB — CBC WITH DIFFERENTIAL/PLATELET
Abs Immature Granulocytes: 0.02 10*3/uL (ref 0.00–0.07)
Basophils Absolute: 0 10*3/uL (ref 0.0–0.1)
Basophils Relative: 0 %
Eosinophils Absolute: 0.1 10*3/uL (ref 0.0–0.5)
Eosinophils Relative: 2 %
HCT: 35 % — ABNORMAL LOW (ref 39.0–52.0)
Hemoglobin: 12.9 g/dL — ABNORMAL LOW (ref 13.0–17.0)
Immature Granulocytes: 0 %
Lymphocytes Relative: 34 %
Lymphs Abs: 3.1 10*3/uL (ref 0.7–4.0)
MCH: 28.8 pg (ref 26.0–34.0)
MCHC: 36.9 g/dL — ABNORMAL HIGH (ref 30.0–36.0)
MCV: 78.1 fL — ABNORMAL LOW (ref 80.0–100.0)
Monocytes Absolute: 0.9 10*3/uL (ref 0.1–1.0)
Monocytes Relative: 10 %
Neutro Abs: 4.8 10*3/uL (ref 1.7–7.7)
Neutrophils Relative %: 54 %
Platelets: 164 10*3/uL (ref 150–400)
RBC: 4.48 MIL/uL (ref 4.22–5.81)
RDW: 13.9 % (ref 11.5–15.5)
WBC Morphology: ABNORMAL
WBC: 8.9 10*3/uL (ref 4.0–10.5)
nRBC: 0.9 % — ABNORMAL HIGH (ref 0.0–0.2)

## 2021-05-04 LAB — COMPREHENSIVE METABOLIC PANEL
ALT: 33 U/L (ref 0–44)
AST: 39 U/L (ref 15–41)
Albumin: 4.4 g/dL (ref 3.5–5.0)
Alkaline Phosphatase: 84 U/L (ref 38–126)
Anion gap: 8 (ref 5–15)
BUN: 10 mg/dL (ref 6–20)
CO2: 24 mmol/L (ref 22–32)
Calcium: 9.2 mg/dL (ref 8.9–10.3)
Chloride: 104 mmol/L (ref 98–111)
Creatinine, Ser: 0.82 mg/dL (ref 0.61–1.24)
GFR, Estimated: >60 mL/min (ref >60)
Glucose, Bld: 106 mg/dL — ABNORMAL HIGH (ref 70–99)
Potassium: 4 mmol/L (ref 3.5–5.1)
Sodium: 136 mmol/L (ref 135–145)
Total Bilirubin: 1.5 mg/dL — ABNORMAL HIGH (ref 0.3–1.2)
Total Protein: 7.4 g/dL (ref 6.5–8.1)

## 2021-05-04 LAB — RETICULOCYTES
Immature Retic Fract: 34.1 % — ABNORMAL HIGH (ref 2.3–15.9)
RBC.: 4.5 MIL/uL (ref 4.22–5.81)
Retic Count, Absolute: 184.9 10*3/uL (ref 19.0–186.0)
Retic Ct Pct: 4.1 % — ABNORMAL HIGH (ref 0.4–3.1)

## 2021-05-04 MED ORDER — HYDROMORPHONE HCL 1 MG/ML IJ SOLN
2.0000 mg | INTRAMUSCULAR | Status: AC
  Administered 2021-05-04: 2 mg via INTRAMUSCULAR
  Filled 2021-05-04: qty 2

## 2021-05-04 MED ORDER — LACTATED RINGERS IV BOLUS
1000.0000 mL | Freq: Once | INTRAVENOUS | Status: AC
  Administered 2021-05-04: 1000 mL via INTRAVENOUS

## 2021-05-04 MED ORDER — KETOROLAC TROMETHAMINE 15 MG/ML IJ SOLN
15.0000 mg | Freq: Once | INTRAMUSCULAR | Status: AC
  Administered 2021-05-04: 15 mg via INTRAVENOUS
  Filled 2021-05-04: qty 1

## 2021-05-04 MED ORDER — HYDROMORPHONE HCL 1 MG/ML IJ SOLN: 1.0000 mg | Freq: Once | INTRAMUSCULAR | Status: DC | PRN

## 2021-05-04 NOTE — ED Triage Notes (Signed)
Pt c/o SCC x 4 days in left arm. Seen here on 11/19, states pain improved with medication but came back. Been taking ibuprofen 800mg  at home & oxycodone without relief.

## 2021-05-04 NOTE — ED Provider Notes (Signed)
[facility] EMERGENCY DEPARTMENT Provider Note   CSN: [id number] Arrival date & time: 05/04/21  1646     History No chief complaint on file.   William Jennings is a 30 y.o. male with PMH sickle cell anemia who presents the emergency department for evaluation of arm pain and sickle cell crisis.  Patient states that this is been going on for 4 days and he was seen 2 days ago in the emergency department but his pain reoccurred.  He has been taking his home oxycodone with no relief.  Denies fever, chest pain, abdominal pain, nausea, vomiting or other systemic symptoms.  HPI     Past Medical History:  Diagnosis Date   Chronic prescription opiate use    Sickle cell anemia (HCC)    Sickle cell disease (HCC)     Patient Active Problem List   Diagnosis Date Noted   Cough    Chronic pain syndrome 08/14/2018   Anemia of chronic disease 08/14/2018   Sickle cell pain crisis (HCC) 05/29/2018   Sickle cell anemia with pain (HCC) 04/05/2016   Acute chest syndrome due to sickle cell crisis (HCC)    Sickle cell anemia with crisis (HCC) 02/21/2014   Sickle cell anemia (HCC) 08/10/2013   Chest pain 08/10/2013   Dehydration 08/08/2013   Sickle cell crisis (HCC) 08/06/2013   Sickle-cell/Hb-C disease with crisis (HCC) 01/22/2013   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 Authorizing Provider  albuterol (VENTOLIN HFA) 108 (90 Base) MCG/ACT inhaler Inhale 2 puffs into the lungs every 6 (six) hours as needed for  wheezing or shortness of breath. 06/17/20   Massie Maroon, FNP  folic acid (FOLVITE) 1 MG tablet Take 1 tablet (1 mg total) by mouth daily. 03/06/19   Massie Maroon, FNP  ibuprofen (ADVIL) 800 MG tablet Take 1 tablet (800 mg total) by mouth every 8 (eight) hours as needed. 10/09/20   Massie Maroon, FNP    Allergies    Shellfish allergy  Review of Systems   Review of Systems  Constitutional:  Negative for chills and fever.  HENT:  Negative for ear pain and sore throat.   Eyes:  Negative for pain and visual disturbance.  Respiratory:  Negative for cough and shortness of breath.   Cardiovascular:  Negative for chest pain and palpitations.  Gastrointestinal:  Negative for abdominal pain and vomiting.  Genitourinary:  Negative for dysuria and hematuria.  Musculoskeletal:  Positive for arthralgias and myalgias. Negative for back pain and joint swelling.  Skin:  Negative for color change and rash.  Neurological:  Negative for seizures and syncope.  All other systems reviewed and are negative.  Physical Exam Updated Vital Signs BP 109/77   Pulse 86   Temp 98.1 F (36.7 C) (Oral)   Resp 15   Ht 5\' 8"  (1.727 m)   Wt 68 kg   SpO2 99%  BMI 22.81 kg/m   Physical Exam Vitals and nursing note reviewed.  Constitutional:      General: He is not in acute distress.    Appearance: He is well-developed.  HENT:     Head: Normocephalic and atraumatic.  Eyes:     Conjunctiva/sclera: Conjunctivae normal.  Cardiovascular:     Rate and Rhythm: Normal rate and regular rhythm.     Heart sounds: No murmur heard. Pulmonary:     Effort: Pulmonary effort is normal. No respiratory distress.     Breath sounds: Normal breath sounds.  Abdominal:     Palpations: Abdomen is soft.     Tenderness: There is no abdominal tenderness.  Musculoskeletal:        General: No swelling.     Cervical back: Neck supple.  Skin:    General: Skin is warm and dry.     Capillary Refill: Capillary refill  takes less than 2 seconds.  Neurological:     Mental Status: He is alert.  Psychiatric:        Mood and Affect: Mood normal.    ED Results / Procedures / Treatments   Labs (all labs ordered are listed, but only abnormal results are displayed) Labs Reviewed  COMPREHENSIVE METABOLIC PANEL - Abnormal; Notable for the following components:      Result Value   Glucose, Bld 106 (*)    Total Bilirubin 1.5 (*)    All other components within normal limits  CBC WITH DIFFERENTIAL/PLATELET - Abnormal; Notable for the following components:   Hemoglobin 12.9 (*)    HCT 35.0 (*)    MCV 78.1 (*)    MCHC 36.9 (*)    nRBC 0.9 (*)    All other components within normal limits  RETICULOCYTES - Abnormal; Notable for the following components:   Retic Ct Pct 4.1 (*)    Immature Retic Fract 34.1 (*)    All other components within normal limits    EKG None  Radiology No results found.  Procedures Procedures   Medications Ordered in ED Medications  HYDROmorphone (DILAUDID) injection 1 mg (has no administration in time range)  HYDROmorphone (DILAUDID) injection 2 mg (2 mg Intramuscular Given 05/04/21 1803)  HYDROmorphone (DILAUDID) injection 2 mg (2 mg Intramuscular Given 05/04/21 1842)  lactated ringers bolus 1,000 mL (0 mLs Intravenous Stopped 05/04/21 2028)  ketorolac (TORADOL) 15 MG/ML injection 15 mg (15 mg Intravenous Given 05/04/21 1937)    ED Course  I have reviewed the triage vital signs and the nursing notes.  Pertinent labs & imaging results that were available during my care of the patient were reviewed by me and considered in my medical decision making (see chart for details).    MDM Rules/Calculators/A&P                           Patient seen emergency department for evaluation of sickle cell crisis.  Physical exam unremarkable.  Laboratory evaluation with a hemoglobin of 12.9, reticulocyte count percentage 4.1.  Low suspicion for acute chest.  Patient given Dilaudid and  Toradol and his symptoms improved.  Patient then requesting discharge.  Patient then discharged. Final Clinical Impression(s) / ED Diagnoses Final diagnoses:  Sickle cell disease with crisis and other complication [facility])    Rx / DC Orders ED Discharge Orders     None        Alois Mincer, Debe Coder, MD 05/04/21 [phone]

## 2021-05-15 ENCOUNTER — Ambulatory Visit: Payer: No Typology Code available for payment source | Admitting: Nurse Practitioner

## 2021-08-23 ENCOUNTER — Emergency Department (HOSPITAL_BASED_OUTPATIENT_CLINIC_OR_DEPARTMENT_OTHER): Payer: No Typology Code available for payment source

## 2021-08-23 ENCOUNTER — Encounter (HOSPITAL_BASED_OUTPATIENT_CLINIC_OR_DEPARTMENT_OTHER): Payer: Self-pay | Admitting: *Deleted

## 2021-08-23 ENCOUNTER — Other Ambulatory Visit: Payer: Self-pay

## 2021-08-23 ENCOUNTER — Emergency Department (HOSPITAL_BASED_OUTPATIENT_CLINIC_OR_DEPARTMENT_OTHER)
Admission: EM | Admit: 2021-08-23 | Discharge: 2021-08-23 | Disposition: A | Discharge status: Home / Self Care | Payer: No Typology Code available for payment source | Attending: Emergency Medicine | Admitting: Emergency Medicine

## 2021-08-23 DIAGNOSIS — D5700 Hb-SS disease with crisis, unspecified: Secondary | ICD-10-CM | POA: Insufficient documentation

## 2021-08-23 DIAGNOSIS — R079 Chest pain, unspecified: Secondary | ICD-10-CM | POA: Diagnosis present

## 2021-08-23 LAB — URINALYSIS, ROUTINE W REFLEX MICROSCOPIC
Bilirubin Urine: NEGATIVE
Glucose, UA: NEGATIVE mg/dL
Hgb urine dipstick: NEGATIVE
Ketones, ur: NEGATIVE mg/dL
Nitrite: NEGATIVE
Protein, ur: NEGATIVE mg/dL
Specific Gravity, Urine: 1.015 (ref 1.005–1.030)
pH: 7 (ref 5.0–8.0)

## 2021-08-23 LAB — COMPREHENSIVE METABOLIC PANEL
ALT: 14 U/L (ref 0–44)
AST: 25 U/L (ref 15–41)
Albumin: 4.2 g/dL (ref 3.5–5.0)
Alkaline Phosphatase: 82 U/L (ref 38–126)
Anion gap: 5 (ref 5–15)
BUN: 9 mg/dL (ref 6–20)
CO2: 27 mmol/L (ref 22–32)
Calcium: 9.1 mg/dL (ref 8.9–10.3)
Chloride: 108 mmol/L (ref 98–111)
Creatinine, Ser: 0.94 mg/dL (ref 0.61–1.24)
GFR, Estimated: >60 mL/min (ref >60)
Glucose, Bld: 90 mg/dL (ref 70–99)
Potassium: 3.7 mmol/L (ref 3.5–5.1)
Sodium: 140 mmol/L (ref 135–145)
Total Bilirubin: 1.5 mg/dL — ABNORMAL HIGH (ref 0.3–1.2)
Total Protein: 7.3 g/dL (ref 6.5–8.1)

## 2021-08-23 LAB — CBC WITH DIFFERENTIAL/PLATELET
Abs Immature Granulocytes: 0 10*3/uL (ref 0.00–0.07)
Basophils Absolute: 0 10*3/uL (ref 0.0–0.1)
Basophils Relative: 0 %
Eosinophils Absolute: 0.1 10*3/uL (ref 0.0–0.5)
Eosinophils Relative: 1 %
HCT: 32.5 % — ABNORMAL LOW (ref 39.0–52.0)
Hemoglobin: 12.1 g/dL — ABNORMAL LOW (ref 13.0–17.0)
Lymphocytes Relative: 69 %
Lymphs Abs: 5.4 10*3/uL — ABNORMAL HIGH (ref 0.7–4.0)
MCH: 29 pg (ref 26.0–34.0)
MCHC: 37.2 g/dL — ABNORMAL HIGH (ref 30.0–36.0)
MCV: 77.9 fL — ABNORMAL LOW (ref 80.0–100.0)
Monocytes Absolute: 0.7 10*3/uL (ref 0.1–1.0)
Monocytes Relative: 9 %
Neutro Abs: 1.6 10*3/uL — ABNORMAL LOW (ref 1.7–7.7)
Neutrophils Relative %: 21 %
Platelets: 178 10*3/uL (ref 150–400)
RBC: 4.17 MIL/uL — ABNORMAL LOW (ref 4.22–5.81)
RDW: 13.8 % (ref 11.5–15.5)
WBC: 7.8 10*3/uL (ref 4.0–10.5)
nRBC: 1.9 % — ABNORMAL HIGH (ref 0.0–0.2)

## 2021-08-23 LAB — RETICULOCYTES
Immature Retic Fract: 34.1 % — ABNORMAL HIGH (ref 2.3–15.9)
RBC.: 4.23 MIL/uL (ref 4.22–5.81)
Retic Count, Absolute: 162.1 10*3/uL (ref 19.0–186.0)
Retic Ct Pct: 3.8 % — ABNORMAL HIGH (ref 0.4–3.1)

## 2021-08-23 LAB — URINALYSIS, MICROSCOPIC (REFLEX)

## 2021-08-23 MED ORDER — IBUPROFEN 800 MG PO TABS
800.0000 mg | ORAL_TABLET | Freq: Four times a day (QID) | ORAL | 0 refills | Status: DC | PRN
Start: 2021-08-23 — End: 2021-08-26

## 2021-08-23 MED ORDER — OXYCODONE-ACETAMINOPHEN 5-325 MG PO TABS: 1.0000 | ORAL_TABLET | Freq: Three times a day (TID) | ORAL | 0 refills | Status: DC | PRN

## 2021-08-23 MED ORDER — HYDROMORPHONE HCL 1 MG/ML IJ SOLN
2.0000 mg | INTRAMUSCULAR | Status: AC
  Administered 2021-08-23: 2 mg via INTRAVENOUS
  Filled 2021-08-23: qty 2

## 2021-08-23 MED ORDER — HYDROMORPHONE HCL 1 MG/ML IJ SOLN
2.0000 mg | Freq: Once | INTRAMUSCULAR | Status: AC
Start: 2021-08-23 — End: 2021-08-23
  Administered 2021-08-23: 2 mg via INTRAVENOUS
  Filled 2021-08-23: qty 2

## 2021-08-23 MED ORDER — LACTATED RINGERS IV BOLUS
1000.0000 mL | Freq: Once | INTRAVENOUS | Status: AC
  Administered 2021-08-23: 1000 mL via INTRAVENOUS

## 2021-08-23 MED ORDER — KETOROLAC TROMETHAMINE 15 MG/ML IJ SOLN
15.0000 mg | INTRAMUSCULAR | Status: AC
  Administered 2021-08-23: 15 mg via INTRAVENOUS
  Filled 2021-08-23: qty 1

## 2021-08-23 NOTE — ED Provider Notes (Signed)
[facility] EMERGENCY DEPARTMENT Provider Note   CSN: [id number] Arrival date & time: 08/23/21  1728     History  Chief Complaint  Patient presents with   Sickle Cell Pain Crisis    William Jennings is a 31 y.o. male with history of sickle cell anemia.  Patient presents to ED for evaluation of sickle cell pain crisis.  Patient states that this morning around 1 AM he began experiencing what he believes is a sickle cell pain crisis.  The patient is endorsing pain in his right shoulder, left shoulder, left arm, chest, back, neck.  Patient denies any abdominal pain, nausea, vomiting, diarrhea, fevers, recent illnesses, shortness of breath.  Patient states that typically at home he is able to control his symptoms utilizing his oxycodone and ibuprofen 800 but he reports that he is recently ran out of his oxycodone.  Patient reports last sickle cell pain crisis 05/04/2021.     Sickle Cell Pain Crisis Associated symptoms: chest pain   Associated symptoms: no fever, no nausea, no shortness of breath and no vomiting       Home Medications Prior to Admission medications   Medication Sig Start Date End Date Taking? Authorizing Provider  ibuprofen (ADVIL) 800 MG tablet Take 1 tablet (800 mg total) by mouth every 6 (six) hours as needed for moderate pain. 08/23/21  Yes Azucena Cecil, PA-C  oxyCODONE-acetaminophen (PERCOCET/ROXICET) 5-325 MG tablet Take 1 tablet by mouth every 8 (eight) hours as needed for severe pain. 08/23/21  Yes Azucena Cecil, PA-C  albuterol (VENTOLIN HFA) 108 (90 Base) MCG/ACT inhaler Inhale 2 puffs into the lungs every 6 (six) hours as needed for wheezing or shortness of breath. 06/17/20   Dorena Dew, FNP  folic acid (FOLVITE) 1 MG tablet Take 1 tablet (1 mg total) by mouth daily. 03/06/19   Dorena Dew, FNP      Allergies    Shellfish allergy    Review of Systems   Review of Systems  Constitutional:  Negative for chills and  fever.  Respiratory:  Negative for shortness of breath.   Cardiovascular:  Positive for chest pain.  Gastrointestinal:  Negative for abdominal pain, diarrhea, nausea and vomiting.  Musculoskeletal:  Positive for arthralgias (Right and left shoulder), back pain and neck pain.  All other systems reviewed and are negative.  Physical Exam Updated Vital Signs BP 116/73    Pulse 82    Temp 97.6 F (36.4 C) (Oral)    Resp 18    Ht 5\' 8"  (1.727 m)    Wt 71.2 kg    SpO2 98%    BMI 23.87 kg/m  Physical Exam Vitals and nursing note reviewed.  Constitutional:      General: He is not in acute distress.    Appearance: Normal appearance. He is not ill-appearing, toxic-appearing or diaphoretic.  HENT:     Head: Normocephalic and atraumatic.     Nose: Nose normal. No congestion.     Mouth/Throat:     Mouth: Mucous membranes are moist.     Pharynx: Oropharynx is clear.  Eyes:     Extraocular Movements: Extraocular movements intact.     Conjunctiva/sclera: Conjunctivae normal.     Pupils: Pupils are equal, round, and reactive to light.  Cardiovascular:     Rate and Rhythm: Normal rate and regular rhythm.  Pulmonary:     Effort: Pulmonary effort is normal.     Breath sounds: Normal breath sounds. No wheezing  or rales.  Abdominal:     General: Abdomen is flat. Bowel sounds are normal.     Palpations: Abdomen is soft.     Tenderness: There is no abdominal tenderness. There is no guarding.  Musculoskeletal:     Right shoulder: Tenderness present. No deformity. Normal range of motion.     Left shoulder: Tenderness present. No deformity. Normal range of motion.     Cervical back: Normal range of motion and neck supple. Tenderness present. No rigidity.     Thoracic back: Tenderness present.     Lumbar back: Tenderness present.  Skin:    General: Skin is warm and dry.     Capillary Refill: Capillary refill takes less than 2 seconds.  Neurological:     Mental Status: He is alert and oriented to  person, place, and time.     GCS: GCS eye subscore is 4. GCS verbal subscore is 5. GCS motor subscore is 6.     Cranial Nerves: Cranial nerves 2-12 are intact. No cranial nerve deficit.     Sensory: Sensation is intact. No sensory deficit.     Motor: Motor function is intact. No weakness.     Coordination: Coordination is intact. Heel to [facility] Test normal.    ED Results / Procedures / Treatments   Labs (all labs ordered are listed, but only abnormal results are displayed) Labs Reviewed  CBC WITH DIFFERENTIAL/PLATELET - Abnormal; Notable for the following components:      Result Value   RBC 4.17 (*)    Hemoglobin 12.1 (*)    HCT 32.5 (*)    MCV 77.9 (*)    MCHC 37.2 (*)    nRBC 1.9 (*)    Neutro Abs 1.6 (*)    Lymphs Abs 5.4 (*)    All other components within normal limits  COMPREHENSIVE METABOLIC PANEL - Abnormal; Notable for the following components:   Total Bilirubin 1.5 (*)    All other components within normal limits  URINALYSIS, ROUTINE W REFLEX MICROSCOPIC - Abnormal; Notable for the following components:   Leukocytes,Ua TRACE (*)    All other components within normal limits  URINALYSIS, MICROSCOPIC (REFLEX) - Abnormal; Notable for the following components:   Bacteria, UA FEW (*)    All other components within normal limits  RETICULOCYTES    EKG EKG Interpretation  Date/Time:  Sunday August 23 2021 17:47:00 EDT Ventricular Rate:  76 PR Interval:  130 QRS Duration: 82 QT Interval:  377 QTC Calculation: 424 R Axis:   38 Text Interpretation: Sinus rhythm RSR' in V1 or V2, probably normal variant Minimal ST elevation, anterior leads No significant change since last tracing Confirmed by Blanchie Dessert [id number]) on 08/23/2021 5:56:09 PM  Radiology DG Chest 2 View  Result Date: 08/23/2021 CLINICAL DATA:  Sickle cell crisis with chest pain. EXAM: CHEST - 2 VIEW COMPARISON:  06/15/2020 FINDINGS: Numerous leads and wires project over the chest. Midline trachea. Normal  heart size. No pleural effusion or pneumothorax. Nonspecific interstitial thickening. No lobar consolidation. Bilateral humeral head infarcts. IMPRESSION: No acute cardiopulmonary disease. Electronically Signed   By: Abigail Miyamoto M.D.   On: 08/23/2021 18:13    Procedures Procedures   Medications Ordered in ED Medications  ketorolac (TORADOL) 15 MG/ML injection 15 mg (15 mg Intravenous Given 08/23/21 1821)  HYDROmorphone (DILAUDID) injection 2 mg (2 mg Intravenous Given 08/23/21 1821)  lactated ringers bolus 1,000 mL (0 mLs Intravenous Stopped 08/23/21 1929)  HYDROmorphone (DILAUDID) injection 2 mg (2 mg  Intravenous Given 08/23/21 1937)    ED Course/ Medical Decision Making/ A&P                           Medical Decision Making Amount and/or Complexity of Data Reviewed Labs: ordered. Radiology: ordered.  Risk Prescription drug management.   32 year old male presents to ED for evaluation of sickle cell pain crisis.  Please see HPI for further details.  On examination, the patient is afebrile, nontachycardic, not hypoxic.  The patient has clear lung sounds bilaterally.  Patient's abdomen is soft compressible in all 4 quadrants.  Patient nontoxic in appearance.  Patient worked up utilizing following labs and imaging studies interpreted by me personally: - Urinalysis shows trace leukocytes - CBC shows slightly decreased hemoglobin at 4.1.  The rest of the patient's lab results in regards to CBC are at baseline.  No white blood cell count elevation. - CMP shows slightly elevated total bilirubin 1.5 - Reticulocyte count had not yet resulted - Patient chest x-ray does not show any signs of avascular necrosis of humeral heads, consolidation, effusion, mediastinal widening - Patient EKG is sinus rhythm  Patient treated with 15 mg Toradol, 4 mg Dilaudid.  The first administration of Dilaudid was given at 621, the second administration of Dilaudid was given 737.  Patient also treated with 1 L  lactated Ringer's.  Patient reports at this time he feels much better.  He is requesting discharge home.  I provided return precautions and he voiced understanding.  Patient stable this time for discharge.   Final Clinical Impression(s) / ED Diagnoses Final diagnoses:  Sickle cell pain crisis [facility])    Rx / DC Orders ED Discharge Orders          Ordered    oxyCODONE-acetaminophen (PERCOCET/ROXICET) 5-325 MG tablet  Every 8 hours PRN        08/23/21 2108    ibuprofen (ADVIL) 800 MG tablet  Every 6 hours PRN        08/23/21 2108              Lawana Chambers 08/23/21 2109    Blanchie Dessert, MD 08/23/21 2159

## 2021-08-23 NOTE — ED Triage Notes (Signed)
C/o SSC pain x 1 one day. Pain in neck and chest and shoulders. States he has taken ibuprofen today without relief ?

## 2021-08-23 NOTE — Discharge Instructions (Signed)
Please return to the ED with any new or worsening signs or symptoms ?Please follow-up with your sickle cell pain clinic this week and get an appointment to have medication refills ?I provided you with temporary medication refills for your oxycodone.  [facility] does not have any pain medication at this time so I will send his prescription to you and have you fill it  ?

## 2021-08-23 NOTE — ED Notes (Signed)
Pt is using urinal at this time to provide urine sample. ?

## 2021-08-23 NOTE — ED Notes (Signed)
Resting quietly, taking in POs well w/o issues, states his pain has def decreased since administration of IV meds.  ?

## 2021-08-26 ENCOUNTER — Telehealth: Payer: Self-pay

## 2021-08-26 ENCOUNTER — Other Ambulatory Visit: Payer: Self-pay | Admitting: Family Medicine

## 2021-08-26 DIAGNOSIS — D5700 Hb-SS disease with crisis, unspecified: Secondary | ICD-10-CM

## 2021-08-26 MED ORDER — OXYCODONE-ACETAMINOPHEN 5-325 MG PO TABS: 1.0000 | ORAL_TABLET | Freq: Four times a day (QID) | ORAL | 0 refills | Status: DC | PRN

## 2021-08-26 MED ORDER — IBUPROFEN 800 MG PO TABS: 800.0000 mg | ORAL_TABLET | Freq: Four times a day (QID) | ORAL | 0 refills | Status: DC | PRN

## 2021-08-26 NOTE — Telephone Encounter (Signed)
Oxycodone  Ibuprofen  

## 2021-08-26 NOTE — Progress Notes (Signed)
Reviewed PDMP substance reporting system prior to prescribing opiate medications. No inconsistencies noted.  ?Meds ordered this encounter  ?Medications  ? oxyCODONE-acetaminophen (PERCOCET/ROXICET) 5-325 MG tablet  ?  Sig: Take 1 tablet by mouth every 6 (six) hours as needed for severe pain.  ?  Dispense:  30 tablet  ?  Refill:  0  ?  Order Specific Question:   Supervising Provider  ?  AnswerQuentin Angst [[id number]]  ? ibuprofen (ADVIL) 800 MG tablet  ?  Sig: Take 1 tablet (800 mg total) by mouth every 6 (six) hours as needed for moderate pain.  ?  Dispense:  21 tablet  ?  Refill:  0  ?  Order Specific Question:   Supervising Provider  ?  AnswerQuentin Angst [[id number]]  ? [name]  APRN, MSN, FNP-C ?[facility] ?[facility] ?[address]  ?[place], [place] [address] ?[phone] ? ?

## 2021-08-31 ENCOUNTER — Ambulatory Visit: Payer: No Typology Code available for payment source | Admitting: Nurse Practitioner

## 2021-10-07 ENCOUNTER — Other Ambulatory Visit: Payer: Self-pay

## 2021-10-07 ENCOUNTER — Encounter (HOSPITAL_BASED_OUTPATIENT_CLINIC_OR_DEPARTMENT_OTHER): Payer: Self-pay

## 2021-10-07 ENCOUNTER — Emergency Department (HOSPITAL_BASED_OUTPATIENT_CLINIC_OR_DEPARTMENT_OTHER)
Admission: EM | Admit: 2021-10-07 | Discharge: 2021-10-07 | Disposition: A | Discharge status: Home / Self Care | Payer: No Typology Code available for payment source | Attending: Emergency Medicine | Admitting: Emergency Medicine

## 2021-10-07 DIAGNOSIS — D57219 Sickle-cell/Hb-C disease with crisis, unspecified: Secondary | ICD-10-CM | POA: Insufficient documentation

## 2021-10-07 DIAGNOSIS — D5700 Hb-SS disease with crisis, unspecified: Secondary | ICD-10-CM

## 2021-10-07 LAB — CBC WITH DIFFERENTIAL/PLATELET
Abs Immature Granulocytes: 0.04 10*3/uL (ref 0.00–0.07)
Basophils Absolute: 0 10*3/uL (ref 0.0–0.1)
Basophils Relative: 0 %
Eosinophils Absolute: 0 10*3/uL (ref 0.0–0.5)
Eosinophils Relative: 0 %
HCT: 30.1 % — ABNORMAL LOW (ref 39.0–52.0)
Hemoglobin: 11.2 g/dL — ABNORMAL LOW (ref 13.0–17.0)
Immature Granulocytes: 0 %
Lymphocytes Relative: 35 %
Lymphs Abs: 3.3 10*3/uL (ref 0.7–4.0)
MCH: 28.8 pg (ref 26.0–34.0)
MCHC: 37.2 g/dL — ABNORMAL HIGH (ref 30.0–36.0)
MCV: 77.4 fL — ABNORMAL LOW (ref 80.0–100.0)
Monocytes Absolute: 0.9 10*3/uL (ref 0.1–1.0)
Monocytes Relative: 9 %
Neutro Abs: 5.2 10*3/uL (ref 1.7–7.7)
Neutrophils Relative %: 56 %
Platelets: 213 10*3/uL (ref 150–400)
RBC: 3.89 MIL/uL — ABNORMAL LOW (ref 4.22–5.81)
RDW: 14.5 % (ref 11.5–15.5)
WBC: 9.4 10*3/uL (ref 4.0–10.5)
nRBC: 1.6 % — ABNORMAL HIGH (ref 0.0–0.2)

## 2021-10-07 LAB — COMPREHENSIVE METABOLIC PANEL
ALT: 30 U/L (ref 0–44)
AST: 41 U/L (ref 15–41)
Albumin: 3.8 g/dL (ref 3.5–5.0)
Alkaline Phosphatase: 92 U/L (ref 38–126)
Anion gap: 5 (ref 5–15)
BUN: 9 mg/dL (ref 6–20)
CO2: 25 mmol/L (ref 22–32)
Calcium: 8.6 mg/dL — ABNORMAL LOW (ref 8.9–10.3)
Chloride: 109 mmol/L (ref 98–111)
Creatinine, Ser: 1 mg/dL (ref 0.61–1.24)
GFR, Estimated: >60 mL/min (ref >60)
Glucose, Bld: 99 mg/dL (ref 70–99)
Potassium: 3.5 mmol/L (ref 3.5–5.1)
Sodium: 139 mmol/L (ref 135–145)
Total Bilirubin: 1.4 mg/dL — ABNORMAL HIGH (ref 0.3–1.2)
Total Protein: 7 g/dL (ref 6.5–8.1)

## 2021-10-07 LAB — RETICULOCYTES
Immature Retic Fract: 33 % — ABNORMAL HIGH (ref 2.3–15.9)
RBC.: 3.88 MIL/uL — ABNORMAL LOW (ref 4.22–5.81)
Retic Count, Absolute: 170.7 10*3/uL (ref 19.0–186.0)
Retic Ct Pct: 4.4 % — ABNORMAL HIGH (ref 0.4–3.1)

## 2021-10-07 MED ORDER — ONDANSETRON HCL 4 MG/2ML IJ SOLN
4.0000 mg | INTRAMUSCULAR | Status: DC | PRN
  Administered 2021-10-07: 4 mg via INTRAVENOUS
  Filled 2021-10-07: qty 2

## 2021-10-07 MED ORDER — KETOROLAC TROMETHAMINE 15 MG/ML IJ SOLN
15.0000 mg | INTRAMUSCULAR | Status: AC
  Administered 2021-10-07: 15 mg via INTRAVENOUS
  Filled 2021-10-07: qty 1

## 2021-10-07 MED ORDER — HYDROMORPHONE HCL 1 MG/ML IJ SOLN
1.0000 mg | Freq: Once | INTRAMUSCULAR | Status: AC
  Administered 2021-10-07: 1 mg via INTRAVENOUS
  Filled 2021-10-07: qty 1

## 2021-10-07 MED ORDER — DIPHENHYDRAMINE HCL 25 MG PO CAPS
25.0000 mg | ORAL_CAPSULE | ORAL | Status: DC | PRN
  Administered 2021-10-07: 25 mg via ORAL
  Filled 2021-10-07: qty 1

## 2021-10-07 MED ORDER — SODIUM CHLORIDE 0.45 % IV SOLN: INTRAVENOUS | Status: DC

## 2021-10-07 NOTE — Discharge Instructions (Signed)
You were seen in the emergency department today for pain.  We are able to get your pain under control.  Please continue taking your medications as prescribed.  Please follow-up with Dr. Doreene Burke as you are scheduled.  Please return for any worsening symptoms ?

## 2021-10-07 NOTE — ED Triage Notes (Signed)
Pt presents with sickle cell pain crisis that started 2 days ago. Pt takes 800 mg ibuprofen and 5 mg oxycodone at home without relief. Pt denies CP, ShOB, or abd pain.  ?

## 2021-10-07 NOTE — ED Provider Notes (Signed)
?[facility] EMERGENCY DEPARTMENT ?Provider Note ? ? ?CSN: [id number] ?Arrival date & time: 10/07/21  1535 ? ?  ? ?History ? ?Chief Complaint  ?Patient presents with  ? Sickle Cell Pain Crisis  ? ? ?William Jennings is a 31 y.o. male.  With past medical history of sickle cell disease, complicated by anemia of chronic disease who presents to the emergency department with sickle cell pain crisis. ? ?Patient states that symptoms have been ongoing for about 48 hours.  He describes having bilateral leg pain and achiness as well as pain in his bilateral thighs on the left more than the right.  He states that he has been using 800 mg ibuprofen as well as 5 mg oxycodone without relief of his symptoms.  He states that he is having more difficulty walking so decided to present to the emergency department.  He denies any redness to the joints or joint swelling.  He denies any numbness or tingling to his legs.  Denies fevers, shortness of breath, chest pain, abdominal pain, headache, dysuria.  Denies any aggravating factors such as hot/cold change, exercise, recent illnesses. ? ? ?Sickle Cell Pain Crisis ? ?  ? ?Home Medications ?Prior to Admission medications   ?Medication Sig Start Date End Date Taking? Authorizing Provider  ?albuterol (VENTOLIN HFA) 108 (90 Base) MCG/ACT inhaler Inhale 2 puffs into the lungs every 6 (six) hours as needed for wheezing or shortness of breath. 06/17/20   Massie Maroon, FNP  ?folic acid (FOLVITE) 1 MG tablet Take 1 tablet (1 mg total) by mouth daily. 03/06/19   Massie Maroon, FNP  ?ibuprofen (ADVIL) 800 MG tablet Take 1 tablet (800 mg total) by mouth every 6 (six) hours as needed for moderate pain. 08/26/21   Massie Maroon, FNP  ?oxyCODONE-acetaminophen (PERCOCET/ROXICET) 5-325 MG tablet Take 1 tablet by mouth every 6 (six) hours as needed for severe pain. 08/26/21   Massie Maroon, FNP  ?   ? ?Allergies    ?Shellfish allergy   ? ?Review of Systems   ?Review of Systems   ?Musculoskeletal:  Positive for arthralgias, gait problem and myalgias.  ?All other systems reviewed and are negative. ? ?Physical Exam ?Updated Vital Signs ?BP 112/71 (BP Location: Left Arm)   Pulse 97   Temp 98.5 ?F (36.9 ?C) (Oral)   Resp 20   Ht 5\' 8"  (1.727 m)   Wt 70.3 kg   SpO2 96%   BMI 23.57 kg/m?  ?Physical Exam ?Vitals and nursing note reviewed.  ?Constitutional:   ?   General: He is not in acute distress. ?   Appearance: Normal appearance. He is normal weight. He is not ill-appearing or toxic-appearing.  ?HENT:  ?   Head: Normocephalic and atraumatic.  ?   Mouth/Throat:  ?   Mouth: Mucous membranes are moist.  ?   Pharynx: Oropharynx is clear.  ?Eyes:  ?   General: No scleral icterus. ?   Extraocular Movements: Extraocular movements intact.  ?Cardiovascular:  ?   Rate and Rhythm: Normal rate and regular rhythm.  ?   Pulses: Normal pulses.  ?   Heart sounds: Normal heart sounds. No murmur heard. ?Pulmonary:  ?   Effort: Pulmonary effort is normal. No respiratory distress.  ?   Breath sounds: Normal breath sounds. No wheezing, rhonchi or rales.  ?Abdominal:  ?   General: Bowel sounds are normal. There is no distension.  ?   Palpations: Abdomen is soft.  ?  Tenderness: There is no abdominal tenderness.  ?Musculoskeletal:     ?   General: Tenderness present. No swelling or signs of injury. Normal range of motion.  ?   Cervical back: Normal range of motion and neck supple.  ?   Right lower leg: No edema.  ?   Left lower leg: No edema.  ?   Comments: Tenderness to palpation of bilateral lower extremities ?The hip, knee, ankle bilaterally have no redness or swelling concerning for septic joint ?DP pulse 2+ bilaterally  ?Skin: ?   General: Skin is warm and dry.  ?   Capillary Refill: Capillary refill takes less than 2 seconds.  ?   Findings: No bruising or erythema.  ?Neurological:  ?   General: No focal deficit present.  ?   Mental Status: He is alert and oriented to person, place, and time. Mental  status is at baseline.  ?   Motor: No weakness.  ?Psychiatric:     ?   Mood and Affect: Mood normal.     ?   Behavior: Behavior normal.     ?   Thought Content: Thought content normal.     ?   Judgment: Judgment normal.  ? ? ?ED Results / Procedures / Treatments   ?Labs ?(all labs ordered are listed, but only abnormal results are displayed) ?Labs Reviewed  ?CBC WITH DIFFERENTIAL/PLATELET - Abnormal; Notable for the following components:  ?    Result Value  ? RBC 3.89 (*)   ? Hemoglobin 11.2 (*)   ? HCT 30.1 (*)   ? MCV 77.4 (*)   ? MCHC 37.2 (*)   ? nRBC 1.6 (*)   ? All other components within normal limits  ?RETICULOCYTES - Abnormal; Notable for the following components:  ? Retic Ct Pct 4.4 (*)   ? RBC. 3.88 (*)   ? Immature Retic Fract 33.0 (*)   ? All other components within normal limits  ?COMPREHENSIVE METABOLIC PANEL - Abnormal; Notable for the following components:  ? Calcium 8.6 (*)   ? Total Bilirubin 1.4 (*)   ? All other components within normal limits  ? ?EKG ?None ? ?Radiology ?No results found. ? ?Procedures ?Procedures  ? ?Medications Ordered in ED ?Medications  ?0.45 % sodium chloride infusion ( Intravenous New Bag/Given 10/07/21 1622)  ?diphenhydrAMINE (BENADRYL) capsule 25-50 mg (25 mg Oral Given 10/07/21 1627)  ?ondansetron (ZOFRAN) injection 4 mg (4 mg Intravenous Given 10/07/21 1624)  ?ketorolac (TORADOL) 15 MG/ML injection 15 mg (15 mg Intravenous Given 10/07/21 1624)  ?HYDROmorphone (DILAUDID) injection 1 mg (1 mg Intravenous Given 10/07/21 1756)  ?HYDROmorphone (DILAUDID) injection 1 mg (1 mg Intravenous Given 10/07/21 1927)  ? ? ?ED Course/ Medical Decision Making/ A&P ?  ?                        ?Medical Decision Making ?Amount and/or Complexity of Data Reviewed ?Labs: ordered. ? ?Risk ?Prescription drug management. ? ?This patient presents to the ED for concern of sickle cell pain crisis, this involves an extensive number of treatment options, and is a complaint that carries with it a high risk  of complications and morbidity.  The differential diagnosis includes ACS, PE, aplastic crisis, sequestration crisis, septic joint, AVN, etc.  ? ?Co morbidities that complicate the patient evaluation ?Sickle cell disease, ACD ? ?Additional history obtained:  ?Additional history obtained from: none  ?External records from outside source obtained and reviewed including: most recent 5 ED  provider notes ? ?EKG: ?Not indicated  ? ?Cardiac Monitoring: ?Not indicated  ? ?Lab Results: ?I personally ordered, reviewed, and interpreted labs. ?Pertinent results include: ?CBC ?Reticulocytes  ?CMP ? ?Imaging Studies ordered:  ?None required ? ?Medications  ?I ordered medication including IVF, toradol, benadryl, zofran, dilaudid for pain  ?Reevaluation of the patient after medication shows that patient improved ?-I reviewed the patient's home medications and did not make adjustments. ?-I did not prescribe new home medications. ? ?Tests Considered: ?X-ray left hip - PE unremarkable, doubt septic joint or AVN at this time  ? ?Critical Interventions: ?Pain management, fluids ? ?Consultations: ?Not indicated ? ?SDH ?None identified  ? ?ED Course: ?31 year old male who presents to the emergency department with complaint of sickle cell crisis. ? ?He identifies pain in bilateral legs. PE is unremarkable. There is not exquisite tenderness to palpation or PE concern for septic joint. Doubt AVN.  ?He denies have chest pain or shortness of breath. VSS. No hypoxia or fever. Doubt ACS, PE, PTX, pneumonia or other intrathoracic abnormalities. ?No abdominal pain, doubt ischemic bowel. ?Retic elevated, doubt aplastic crisis ?Remainder of labs within normal limits, anemia is baseline. Doubt sequestration crisis. ? ?Given fluids, pain medication with improvement in symptoms.  He feels much improved.  He feels that he is able to go home at this time.  We will have him follow-up with Dr. Hyman Hopes who is his sickle cell physician.  He is to return for  worsening symptoms. ? ?After consideration of the diagnostic results and the patients response to treatment, I feel that the patent would benefit from discharge. ?The patient has been appropriately medically screened and/

## 2021-10-21 ENCOUNTER — Encounter (HOSPITAL_BASED_OUTPATIENT_CLINIC_OR_DEPARTMENT_OTHER): Payer: Self-pay

## 2021-10-21 ENCOUNTER — Emergency Department (HOSPITAL_BASED_OUTPATIENT_CLINIC_OR_DEPARTMENT_OTHER)
Admission: EM | Admit: 2021-10-21 | Discharge: 2021-10-21 | Disposition: A | Discharge status: Home / Self Care | Payer: No Typology Code available for payment source | Attending: Emergency Medicine | Admitting: Emergency Medicine

## 2021-10-21 ENCOUNTER — Other Ambulatory Visit: Payer: Self-pay

## 2021-10-21 DIAGNOSIS — D5700 Hb-SS disease with crisis, unspecified: Secondary | ICD-10-CM | POA: Diagnosis present

## 2021-10-21 LAB — COMPREHENSIVE METABOLIC PANEL
ALT: 31 U/L (ref 0–44)
AST: 36 U/L (ref 15–41)
Albumin: 4.1 g/dL (ref 3.5–5.0)
Alkaline Phosphatase: 94 U/L (ref 38–126)
Anion gap: 4 — ABNORMAL LOW (ref 5–15)
BUN: 8 mg/dL (ref 6–20)
CO2: 24 mmol/L (ref 22–32)
Calcium: 9 mg/dL (ref 8.9–10.3)
Chloride: 112 mmol/L — ABNORMAL HIGH (ref 98–111)
Creatinine, Ser: 0.86 mg/dL (ref 0.61–1.24)
GFR, Estimated: >60 mL/min (ref >60)
Glucose, Bld: 92 mg/dL (ref 70–99)
Potassium: 4.2 mmol/L (ref 3.5–5.1)
Sodium: 140 mmol/L (ref 135–145)
Total Bilirubin: 1.3 mg/dL — ABNORMAL HIGH (ref 0.3–1.2)
Total Protein: 7.2 g/dL (ref 6.5–8.1)

## 2021-10-21 LAB — RETICULOCYTES
Immature Retic Fract: 42.2 % — ABNORMAL HIGH (ref 2.3–15.9)
RBC.: 4.38 MIL/uL (ref 4.22–5.81)
Retic Count, Absolute: 214.6 10*3/uL — ABNORMAL HIGH (ref 19.0–186.0)
Retic Ct Pct: 4.9 % — ABNORMAL HIGH (ref 0.4–3.1)

## 2021-10-21 LAB — CBC WITH DIFFERENTIAL/PLATELET
Abs Immature Granulocytes: 0.02 10*3/uL (ref 0.00–0.07)
Basophils Absolute: 0 10*3/uL (ref 0.0–0.1)
Basophils Relative: 0 %
Eosinophils Absolute: 0 10*3/uL (ref 0.0–0.5)
Eosinophils Relative: 1 %
HCT: 33.9 % — ABNORMAL LOW (ref 39.0–52.0)
Hemoglobin: 12.3 g/dL — ABNORMAL LOW (ref 13.0–17.0)
Immature Granulocytes: 0 %
Lymphocytes Relative: 29 %
Lymphs Abs: 1.8 10*3/uL (ref 0.7–4.0)
MCH: 28.1 pg (ref 26.0–34.0)
MCHC: 36.3 g/dL — ABNORMAL HIGH (ref 30.0–36.0)
MCV: 77.4 fL — ABNORMAL LOW (ref 80.0–100.0)
Monocytes Absolute: 0.9 10*3/uL (ref 0.1–1.0)
Monocytes Relative: 14 %
Neutro Abs: 3.5 10*3/uL (ref 1.7–7.7)
Neutrophils Relative %: 56 %
Platelets: 176 10*3/uL (ref 150–400)
RBC: 4.38 MIL/uL (ref 4.22–5.81)
RDW: 14.9 % (ref 11.5–15.5)
WBC: 6.3 10*3/uL (ref 4.0–10.5)
nRBC: 1.7 % — ABNORMAL HIGH (ref 0.0–0.2)

## 2021-10-21 MED ORDER — HYDROMORPHONE HCL 1 MG/ML IJ SOLN
1.0000 mg | INTRAMUSCULAR | Status: AC
  Administered 2021-10-21: 1 mg via INTRAVENOUS
  Filled 2021-10-21: qty 1

## 2021-10-21 MED ORDER — OXYCODONE HCL 5 MG PO TABS
15.0000 mg | ORAL_TABLET | Freq: Once | ORAL | Status: AC
  Administered 2021-10-21: 15 mg via ORAL
  Filled 2021-10-21: qty 3

## 2021-10-21 MED ORDER — KETOROLAC TROMETHAMINE 30 MG/ML IJ SOLN
15.0000 mg | Freq: Once | INTRAMUSCULAR | Status: AC
  Administered 2021-10-21: 15 mg via INTRAVENOUS
  Filled 2021-10-21: qty 1

## 2021-10-21 NOTE — ED Notes (Signed)
Provided snack

## 2021-10-21 NOTE — ED Triage Notes (Signed)
Sickle Cell pain to right leg x 3 days. Taking 5mg  oxy & 800mg  ibuprofen with some relief but pain returns. ?

## 2021-10-21 NOTE — ED Provider Notes (Addendum)
?[facility] EMERGENCY DEPARTMENT ?Provider Note ? ? ?CSN: [id number] ?Arrival date & time: 10/21/21  1111 ? ?  ? ?History ? ?Chief Complaint  ?Patient presents with  ? Sickle Cell Pain Crisis  ? ? ?William Jennings is a 31 y.o. male. ? ?HPI ? ?  ? ?31 year old male comes in with chief complaint of sickle cell pain.  Indicates that he has been having pain over the right leg for the last 2 or 3 days.  He has been taking oxycodone for his breakthrough pain along with oxycodone without any relief.  He has no specific triggers associated with this pain.  Specifically, no nausea, vomiting, fevers, chills, dehydration, increased stress, lack of sleep.  Patient states that in general his sickle cell is well controlled, however over the last 2 or 3 months he has had multiple sickle cell pain crisis that is not typical. ? ?Home Medications ?Prior to Admission medications   ?Medication Sig Start Date End Date Taking? Authorizing Provider  ?albuterol (VENTOLIN HFA) 108 (90 Base) MCG/ACT inhaler Inhale 2 puffs into the lungs every 6 (six) hours as needed for wheezing or shortness of breath. 06/17/20   Massie Maroon, FNP  ?folic acid (FOLVITE) 1 MG tablet Take 1 tablet (1 mg total) by mouth daily. 03/06/19   Massie Maroon, FNP  ?ibuprofen (ADVIL) 800 MG tablet Take 1 tablet (800 mg total) by mouth every 6 (six) hours as needed for moderate pain. 08/26/21   Massie Maroon, FNP  ?oxyCODONE-acetaminophen (PERCOCET/ROXICET) 5-325 MG tablet Take 1 tablet by mouth every 6 (six) hours as needed for severe pain. 08/26/21   Massie Maroon, FNP  ?   ? ?Allergies    ?Shellfish allergy   ? ?Review of Systems   ?Review of Systems  ?All other systems reviewed and are negative. ? ?Physical Exam ?Updated Vital Signs ?BP 98/70   Pulse 65   Temp 98.4 ?F (36.9 ?C) (Oral)   Resp 16   Ht 5\' 8"  (1.727 m)   Wt 70.3 kg   SpO2 100%   BMI 23.57 kg/m?  ?Physical Exam ?Vitals and nursing note reviewed.  ?Constitutional:   ?    Appearance: He is well-developed.  ?HENT:  ?   Head: Atraumatic.  ?Eyes:  ?   General: No scleral icterus. ?   Pupils: Pupils are equal, round, and reactive to light.  ?Cardiovascular:  ?   Rate and Rhythm: Normal rate.  ?Pulmonary:  ?   Effort: Pulmonary effort is normal.  ?Musculoskeletal:  ?   Cervical back: Neck supple.  ?Skin: ?   General: Skin is warm.  ?Neurological:  ?   Mental Status: He is alert and oriented to person, place, and time.  ? ? ?ED Results / Procedures / Treatments   ?Labs ?(all labs ordered are listed, but only abnormal results are displayed) ?Labs Reviewed  ?COMPREHENSIVE METABOLIC PANEL - Abnormal; Notable for the following components:  ?    Result Value  ? Chloride 112 (*)   ? Total Bilirubin 1.3 (*)   ? Anion gap 4 (*)   ? All other components within normal limits  ?CBC WITH DIFFERENTIAL/PLATELET - Abnormal; Notable for the following components:  ? Hemoglobin 12.3 (*)   ? HCT 33.9 (*)   ? MCV 77.4 (*)   ? MCHC 36.3 (*)   ? nRBC 1.7 (*)   ? All other components within normal limits  ?RETICULOCYTES - Abnormal; Notable for the following components:  ?  Retic Ct Pct 4.9 (*)   ? Retic Count, Absolute 214.6 (*)   ? Immature Retic Fract 42.2 (*)   ? All other components within normal limits  ? ? ?EKG ?None ? ?Radiology ?No results found. ? ?Procedures ?Procedures  ? ? ?Medications Ordered in ED ?Medications  ?ketorolac (TORADOL) 30 MG/ML injection 15 mg (has no administration in time range)  ?HYDROmorphone (DILAUDID) injection 1 mg (1 mg Intravenous Given 10/21/21 1243)  ?HYDROmorphone (DILAUDID) injection 1 mg (1 mg Intravenous Given 10/21/21 1354)  ?HYDROmorphone (DILAUDID) injection 1 mg (1 mg Intravenous Given 10/21/21 1436)  ?oxyCODONE (Oxy IR/ROXICODONE) immediate release tablet 15 mg (15 mg Oral Given 10/21/21 1502)  ? ? ?ED Course/ Medical Decision Making/ A&P ?Clinical Course as of 10/21/21 1513  ?Wed Oct 21, 2021  ?1512 Patient reassessed.  Lab results are reassuring.  He indicates that his  pain is responding and improving.  He has received 3 rounds of hydromorphone and oral oxycodone now.  He feels comfortable going home.  IV Toradol ordered.  Stable for discharge.  Still not pain-free, therefore strict ER return precautions discussed. ? ?Admission considered, however patient is feeling better and is comfortable going home, therefore we will proceed with discharge to home. [AN]  ?  ?Clinical Course User Index ?[AN] Derwood Kaplan, MD  ? ?                        ?Medical Decision Making ?Amount and/or Complexity of Data Reviewed ?Labs: ordered. ? ?Risk ?Prescription drug management. ? ? ?Pt comes in with sickle cell related pain.  Patient has history of sickle cell anemia with complications from it including vaso-occlusive crisis in the past. ?We have reviewed patient's previous ED visits, recent lab results and recent imaging results for this encounter. ? ?Differential diagnosis includes vaso-occlusive pain, occult infection, dehydration, electrolyte abnormality, hyperbilirubinemia, aplastic anemia. ? ?VSS and WNL -  hemodynamically stable.  History and exam is not indicative of any specific source of infection at this time. ? ?Pain appears vaso-occlusive type and typical of previous pain. ?Appropriate labs ordered. ? ?Pain control and symptom management initiated while in the ED with parenteral medication. We will reassess patient after multiple doses of analgesics. Goal is to break the pain and see if patient feels comfortable going home. ? ?Currently, there is no signs of severe decompensation clinically. Will continue to monitor closely.  ?Admission has been considered for this patient, and we will proceed with this request If we are unable to control the pain, we will admit the patient. ? ? ?Final Clinical Impression(s) / ED Diagnoses ?Final diagnoses:  ?Sickle cell pain crisis (HCC)  ? ? ?Rx / DC Orders ?ED Discharge Orders   ? ? None  ? ?  ? ? ?  ?Derwood Kaplan, MD ?10/21/21 1338 ? ?   ?Derwood Kaplan, MD ?10/21/21 1513 ? ?

## 2021-10-21 NOTE — Discharge Instructions (Addendum)
You are seen in the ER for sickle cell pain. ? ?Fortunately, your pain has responded to some of the medication here.   ? ?Please continue to take ibuprofen and Tylenol every 4-6 hours for pain control and oxycodone in between if needed. ? ?Ensure that you are hydrating well. ?

## 2021-10-28 ENCOUNTER — Ambulatory Visit: Payer: No Typology Code available for payment source | Admitting: Nurse Practitioner

## 2021-11-18 ENCOUNTER — Ambulatory Visit (INDEPENDENT_AMBULATORY_CARE_PROVIDER_SITE_OTHER): Payer: No Typology Code available for payment source | Admitting: Nurse Practitioner

## 2021-11-18 ENCOUNTER — Other Ambulatory Visit: Payer: Self-pay | Admitting: Family Medicine

## 2021-11-18 ENCOUNTER — Encounter: Payer: Self-pay | Admitting: Nurse Practitioner

## 2021-11-18 VITALS — BP 117/68 | HR 91 | Temp 97.3°F | Ht 68.0 in | Wt 154.2 lb

## 2021-11-18 DIAGNOSIS — Z0000 Encounter for general adult medical examination without abnormal findings: Secondary | ICD-10-CM

## 2021-11-18 DIAGNOSIS — U071 COVID-19: Secondary | ICD-10-CM

## 2021-11-18 DIAGNOSIS — D5700 Hb-SS disease with crisis, unspecified: Secondary | ICD-10-CM

## 2021-11-18 DIAGNOSIS — J1282 Pneumonia due to coronavirus disease 2019: Secondary | ICD-10-CM

## 2021-11-18 DIAGNOSIS — H9192 Unspecified hearing loss, left ear: Secondary | ICD-10-CM | POA: Diagnosis not present

## 2021-11-18 DIAGNOSIS — R0602 Shortness of breath: Secondary | ICD-10-CM

## 2021-11-18 LAB — POCT URINALYSIS DIP (CLINITEK)
Bilirubin, UA: NEGATIVE
Blood, UA: NEGATIVE
Glucose, UA: NEGATIVE mg/dL
Ketones, POC UA: NEGATIVE mg/dL
Nitrite, UA: NEGATIVE
POC PROTEIN,UA: NEGATIVE
Spec Grav, UA: 1.015 (ref 1.010–1.025)
Urobilinogen, UA: 1 E.U./dL
pH, UA: 7 (ref 5.0–8.0)

## 2021-11-18 MED ORDER — OXYCODONE-ACETAMINOPHEN 10-325 MG PO TABS: 1.0000 | ORAL_TABLET | Freq: Four times a day (QID) | ORAL | 0 refills | Status: DC | PRN

## 2021-11-18 MED ORDER — IBUPROFEN 800 MG PO TABS: 800.0000 mg | ORAL_TABLET | Freq: Four times a day (QID) | ORAL | 0 refills | Status: DC | PRN

## 2021-11-18 MED ORDER — ALBUTEROL SULFATE HFA 108 (90 BASE) MCG/ACT IN AERS: 2.0000 | INHALATION_SPRAY | Freq: Four times a day (QID) | RESPIRATORY_TRACT | 0 refills | Status: AC | PRN

## 2021-11-18 NOTE — Patient Instructions (Signed)
1. Sickle cell pain crisis (HCC)  - ibuprofen (ADVIL) 800 MG tablet; Take 1 tablet (800 mg total) by mouth every 6 (six) hours as needed for moderate pain.  Dispense: 21 tablet; Refill: 0 - [id number] 11+Oxyco+Alc+Crt-Bund - Sickle Cell Panel - Urine Culture  3. Hearing loss of left ear, unspecified hearing loss type  - Ambulatory referral to ENT  4. Shortness of breath  - albuterol (VENTOLIN HFA) 108 (90 Base) MCG/ACT inhaler; Inhale 2 puffs into the lungs every 6 (six) hours as needed for wheezing or shortness of breath.  Dispense: 8 g; Refill: 0  5. Routine health maintenance  - Lipid Panel  Follow up:  Follow up in 3 months with [place]

## 2021-11-18 NOTE — Assessment & Plan Note (Signed)
-   ibuprofen (ADVIL) 800 MG tablet; Take 1 tablet (800 mg total) by mouth every 6 (six) hours as needed for moderate pain.  Dispense: 21 tablet; Refill: 0 - [id number] 11+Oxyco+Alc+Crt-Bund - Sickle Cell Panel - Urine Culture  3. Hearing loss of left ear, unspecified hearing loss type  - Ambulatory referral to ENT  4. Shortness of breath  - albuterol (VENTOLIN HFA) 108 (90 Base) MCG/ACT inhaler; Inhale 2 puffs into the lungs every 6 (six) hours as needed for wheezing or shortness of breath.  Dispense: 8 g; Refill: 0  5. Routine health maintenance  - Lipid Panel  Follow up:  Follow up in 3 months with [place]

## 2021-11-18 NOTE — Progress Notes (Signed)
Reviewed PDMP substance reporting system prior to prescribing opiate medications. No inconsistencies noted.  Meds ordered this encounter  Medications   oxyCODONE-acetaminophen (PERCOCET) 10-325 MG tablet    Sig: Take 1 tablet by mouth every 6 (six) hours as needed for pain.    Dispense:  60 tablet    Refill:  0    Order Specific Question:   Supervising Provider    Answer:   JEGEDE, OLUGBEMIGA E [[id number]]   Sylina Henion Moore Madalyne Husk  APRN, MSN, FNP-C [facility] [address]  [place], [place] [address] [phone]  

## 2021-11-18 NOTE — Progress Notes (Signed)
@Patient  ID: , male    DOB: 25-Mar-1991, 31 y.o.   MRN: [id number]  Chief Complaint  Patient presents with   Annual Exam    Pt is here for physical. Pt is requesting to increase his Oxycodone what he has now is not helping his pain also needs a refill on all medications.    Referring provider: [id number], FNP   HPI  Patient presents today for routine physical.  He states that he has had increased number of sickle cell crisis over the past few months.  He states that he is having pain in his left leg that radiates into his right leg.  He is currently on oxycodone but feels that this may need to be increased.  This was discussed with Massie Maroon Hollis and she is agreeable to increase his dose of oxycodone I will send in a refill for that for him today.  Patient is also concerned about hearing loss to his left ear.  Upon exam his ear canal is clear.  Patient is noted to have decreased hearing to left ear.  We discussed that we will place a referral to ENT for him.  Denies f/c/s, n/v/d, hemoptysis, PND, leg swelling Denies chest pain or edema     Allergies  Allergen Reactions   Shellfish Allergy Anaphylaxis    Immunization History  Administered Date(s) Administered   Tdap 08/05/2016    Past Medical History:  Diagnosis Date   Chronic prescription opiate use    Sickle cell anemia (HCC)    Sickle cell disease (HCC)     Tobacco History: Social History   Tobacco Use  Smoking Status Former  Smokeless Tobacco Never   Counseling given: Not Answered   Outpatient Encounter Medications as of 11/18/2021  Medication Sig   [DISCONTINUED] albuterol (VENTOLIN HFA) 108 (90 Base) MCG/ACT inhaler Inhale 2 puffs into the lungs every 6 (six) hours as needed for wheezing or shortness of breath.   [DISCONTINUED] ibuprofen (ADVIL) 800 MG tablet Take 1 tablet (800 mg total) by mouth every 6 (six) hours as needed for moderate pain.   [DISCONTINUED] oxyCODONE-acetaminophen  (PERCOCET/ROXICET) 5-325 MG tablet Take 1 tablet by mouth every 6 (six) hours as needed for severe pain.   albuterol (VENTOLIN HFA) 108 (90 Base) MCG/ACT inhaler Inhale 2 puffs into the lungs every 6 (six) hours as needed for wheezing or shortness of breath.   folic acid (FOLVITE) 1 MG tablet Take 1 tablet (1 mg total) by mouth daily. (Patient not taking: Reported on 11/18/2021)   ibuprofen (ADVIL) 800 MG tablet Take 1 tablet (800 mg total) by mouth every 6 (six) hours as needed for moderate pain.   No facility-administered encounter medications on file as of 11/18/2021.     Review of Systems  Review of Systems  Constitutional: Negative.   HENT: Negative.    Cardiovascular: Negative.   Gastrointestinal: Negative.   Allergic/Immunologic: Negative.   Neurological: Negative.   Psychiatric/Behavioral: Negative.        Physical Exam  BP 117/68 (BP Location: Right Arm, Patient Position: Sitting, Cuff Size: Normal)   Pulse 91   Temp (!) 97.3 F (36.3 C)   Ht 5\' 8"  (1.727 m)   Wt 154 lb 3.2 oz (69.9 kg)   SpO2 100%   BMI 23.45 kg/m   Wt Readings from Last 5 Encounters:  11/18/21 154 lb 3.2 oz (69.9 kg)  10/21/21 155 lb (70.3 kg)  10/07/21 155 lb (70.3 kg)  08/23/21 157 lb (71.2  kg)  05/04/21 150 lb (68 kg)     Physical Exam Vitals and nursing note reviewed.  Constitutional:      General: He is not in acute distress.    Appearance: He is well-developed.  Cardiovascular:     Rate and Rhythm: Normal rate and regular rhythm.  Pulmonary:     Effort: Pulmonary effort is normal.     Breath sounds: Normal breath sounds.  Skin:    General: Skin is warm and dry.  Neurological:     Mental Status: He is alert and oriented to person, place, and time.      Assessment & Plan:   Sickle cell pain crisis (HCC) - ibuprofen (ADVIL) 800 MG tablet; Take 1 tablet (800 mg total) by mouth every 6 (six) hours as needed for moderate pain.  Dispense: 21 tablet; Refill: 0 - [id number]  11+Oxyco+Alc+Crt-Bund - Sickle Cell Panel - Urine Culture  3. Hearing loss of left ear, unspecified hearing loss type  - Ambulatory referral to ENT  4. Shortness of breath  - albuterol (VENTOLIN HFA) 108 (90 Base) MCG/ACT inhaler; Inhale 2 puffs into the lungs every 6 (six) hours as needed for wheezing or shortness of breath.  Dispense: 8 g; Refill: 0  5. Routine health maintenance  - Lipid Panel  Follow up:  Follow up in 3 months with [place]     Jerrol Helmers S Rien Marland, NP 11/18/2021

## 2021-11-19 ENCOUNTER — Other Ambulatory Visit: Payer: Self-pay | Admitting: Nurse Practitioner

## 2021-11-19 ENCOUNTER — Telehealth: Payer: Self-pay

## 2021-11-19 ENCOUNTER — Other Ambulatory Visit: Payer: Self-pay | Admitting: Family Medicine

## 2021-11-19 DIAGNOSIS — E559 Vitamin D deficiency, unspecified: Secondary | ICD-10-CM

## 2021-11-19 DIAGNOSIS — D5700 Hb-SS disease with crisis, unspecified: Secondary | ICD-10-CM

## 2021-11-19 LAB — CMP14+CBC/D/PLT+FER+RETIC+V...
ALT: 20 IU/L (ref 0–44)
AST: 30 IU/L (ref 0–40)
Albumin/Globulin Ratio: 1.7 (ref 1.2–2.2)
Albumin: 4.5 g/dL (ref 4.0–5.0)
Alkaline Phosphatase: 106 IU/L (ref 44–121)
BUN/Creatinine Ratio: 10 (ref 9–20)
BUN: 10 mg/dL (ref 6–20)
Basophils Absolute: 0 10*3/uL (ref 0.0–0.2)
Basos: 1 %
Bilirubin Total: 1.1 mg/dL (ref 0.0–1.2)
CO2: 24 mmol/L (ref 20–29)
Calcium: 9.2 mg/dL (ref 8.7–10.2)
Chloride: 106 mmol/L (ref 96–106)
Creatinine, Ser: 1.05 mg/dL (ref 0.76–1.27)
EOS (ABSOLUTE): 0 10*3/uL (ref 0.0–0.4)
Eos: 1 %
Ferritin: 302 ng/mL (ref 30–400)
Globulin, Total: 2.7 g/dL (ref 1.5–4.5)
Glucose: 70 mg/dL (ref 70–99)
Hematocrit: 37.5 % (ref 37.5–51.0)
Hemoglobin: 12.6 g/dL — ABNORMAL LOW (ref 13.0–17.7)
Immature Grans (Abs): 0 10*3/uL (ref 0.0–0.1)
Immature Granulocytes: 0 %
Lymphocytes Absolute: 2.5 10*3/uL (ref 0.7–3.1)
Lymphs: 49 %
MCH: 28.3 pg (ref 26.6–33.0)
MCHC: 33.6 g/dL (ref 31.5–35.7)
MCV: 84 fL (ref 79–97)
Monocytes Absolute: 0.6 10*3/uL (ref 0.1–0.9)
Monocytes: 12 %
NRBC: 1 % — ABNORMAL HIGH (ref 0–0)
Neutrophils Absolute: 1.9 10*3/uL (ref 1.4–7.0)
Neutrophils: 37 %
Platelets: 176 10*3/uL (ref 150–450)
Potassium: 4 mmol/L (ref 3.5–5.2)
RBC: 4.45 x10E6/uL (ref 4.14–5.80)
RDW: 15.5 % — ABNORMAL HIGH (ref 11.6–15.4)
Retic Ct Pct: 3.1 % — ABNORMAL HIGH (ref 0.6–2.6)
Sodium: 142 mmol/L (ref 134–144)
Total Protein: 7.2 g/dL (ref 6.0–8.5)
Vit D, 25-Hydroxy: 8.9 ng/mL — ABNORMAL LOW (ref 30.0–100.0)
WBC: 5.1 10*3/uL (ref 3.4–10.8)
eGFR: 96 mL/min/{1.73_m2} (ref >59)

## 2021-11-19 LAB — LIPID PANEL
Chol/HDL Ratio: 2.6 ratio (ref 0.0–5.0)
Cholesterol, Total: 103 mg/dL (ref 100–199)
HDL: 40 mg/dL (ref >39)
LDL Chol Calc (NIH): 51 mg/dL (ref 0–99)
Triglycerides: 46 mg/dL (ref 0–149)
VLDL Cholesterol Cal: 12 mg/dL (ref 5–40)

## 2021-11-19 MED ORDER — OXYCODONE HCL 10 MG PO TABS: 10.0000 mg | ORAL_TABLET | Freq: Four times a day (QID) | ORAL | 0 refills | Status: DC | PRN

## 2021-11-19 MED ORDER — VITAMIN D (ERGOCALCIFEROL) 1.25 MG (50000 UNIT) PO CAPS: 50000.0000 [IU] | ORAL_CAPSULE | ORAL | 1 refills | Status: AC

## 2021-11-19 NOTE — Progress Notes (Signed)
Reviewed PDMP substance reporting system prior to prescribing opiate medications. No inconsistencies noted.  Meds ordered this encounter  Medications   Oxycodone HCl 10 MG TABS    Sig: Take 1 tablet (10 mg total) by mouth every 6 (six) hours as needed.    Dispense:  60 tablet    Refill:  0    Order Specific Question:   Supervising Provider    Answer:   JEGEDE, OLUGBEMIGA E [[id number]]   William Viernes Moore Mairin Lindsley  APRN, MSN, FNP-C [facility] [address]  [place], [place] [address] [phone]  

## 2021-11-20 LAB — URINE CULTURE

## 2021-11-23 LAB — DRUG SCREEN 764883 11+OXYCO+ALC+CRT-BUND
Amphetamines, Urine: NEGATIVE ng/mL
BENZODIAZ UR QL: NEGATIVE ng/mL
Barbiturate: NEGATIVE ng/mL
Cocaine (Metabolite): NEGATIVE ng/mL
Creatinine: 61.7 mg/dL (ref 20.0–300.0)
Ethanol: NEGATIVE %
Meperidine: NEGATIVE ng/mL
Methadone Screen, Urine: NEGATIVE ng/mL
OPIATE SCREEN URINE: NEGATIVE ng/mL
Oxycodone/Oxymorphone, Urine: NEGATIVE ng/mL
Phencyclidine: NEGATIVE ng/mL
Propoxyphene: NEGATIVE ng/mL
Tramadol: NEGATIVE ng/mL
pH, Urine: 6.9 (ref 4.5–8.9)

## 2021-11-23 LAB — CANNABINOID CONFIRMATION, UR
CANNABINOIDS: POSITIVE — AB
Carboxy THC GC/MS Conf: 187 ng/mL

## 2021-11-24 NOTE — Telephone Encounter (Signed)
No additional notes needed  

## 2022-02-23 ENCOUNTER — Telehealth (INDEPENDENT_AMBULATORY_CARE_PROVIDER_SITE_OTHER): Payer: Self-pay | Admitting: Family Medicine

## 2022-02-23 DIAGNOSIS — D5700 Hb-SS disease with crisis, unspecified: Secondary | ICD-10-CM

## 2022-02-23 DIAGNOSIS — H3500 Unspecified background retinopathy: Secondary | ICD-10-CM

## 2022-02-23 DIAGNOSIS — E559 Vitamin D deficiency, unspecified: Secondary | ICD-10-CM

## 2022-02-24 ENCOUNTER — Other Ambulatory Visit: Payer: Self-pay

## 2022-02-24 ENCOUNTER — Other Ambulatory Visit: Payer: Self-pay | Admitting: Family Medicine

## 2022-02-24 DIAGNOSIS — D5700 Hb-SS disease with crisis, unspecified: Secondary | ICD-10-CM

## 2022-02-24 DIAGNOSIS — E559 Vitamin D deficiency, unspecified: Secondary | ICD-10-CM

## 2022-02-24 NOTE — Progress Notes (Signed)
Orders Placed This Encounter  Procedures   Sickle Cell Panel   Berdell Hostetler Moore Aaisha Sliter  APRN, MSN, FNP-C [facility] [address]  [place], [place] [address] [phone]  

## 2022-02-25 LAB — CMP14+CBC/D/PLT+FER+RETIC+V...
ALT: 20 IU/L (ref 0–44)
AST: 23 IU/L (ref 0–40)
Albumin/Globulin Ratio: 1.5 (ref 1.2–2.2)
Albumin: 4.2 g/dL (ref 4.1–5.1)
Alkaline Phosphatase: 153 IU/L — ABNORMAL HIGH (ref 44–121)
BUN/Creatinine Ratio: 7 — ABNORMAL LOW (ref 9–20)
BUN: 6 mg/dL (ref 6–20)
Basophils Absolute: 0 10*3/uL (ref 0.0–0.2)
Basos: 1 %
Bilirubin Total: 1.2 mg/dL (ref 0.0–1.2)
CO2: 24 mmol/L (ref 20–29)
Calcium: 9.3 mg/dL (ref 8.7–10.2)
Chloride: 103 mmol/L (ref 96–106)
Creatinine, Ser: 0.82 mg/dL (ref 0.76–1.27)
EOS (ABSOLUTE): 0 10*3/uL (ref 0.0–0.4)
Eos: 1 %
Ferritin: 828 ng/mL — ABNORMAL HIGH (ref 30–400)
Globulin, Total: 2.8 g/dL (ref 1.5–4.5)
Glucose: 102 mg/dL — ABNORMAL HIGH (ref 70–99)
Hematocrit: 28.7 % — ABNORMAL LOW (ref 37.5–51.0)
Hemoglobin: 9.6 g/dL — ABNORMAL LOW (ref 13.0–17.7)
Immature Grans (Abs): 0 10*3/uL (ref 0.0–0.1)
Immature Granulocytes: 1 %
Lymphocytes Absolute: 2.8 10*3/uL (ref 0.7–3.1)
Lymphs: 42 %
MCH: 28.2 pg (ref 26.6–33.0)
MCHC: 33.4 g/dL (ref 31.5–35.7)
MCV: 84 fL (ref 79–97)
Monocytes Absolute: 0.7 10*3/uL (ref 0.1–0.9)
Monocytes: 10 %
NRBC: 3 % — ABNORMAL HIGH (ref 0–0)
Neutrophils Absolute: 3.1 10*3/uL (ref 1.4–7.0)
Neutrophils: 45 %
Platelets: 299 10*3/uL (ref 150–450)
Potassium: 4.1 mmol/L (ref 3.5–5.2)
RBC: 3.4 x10E6/uL — ABNORMAL LOW (ref 4.14–5.80)
RDW: 17.7 % — ABNORMAL HIGH (ref 11.6–15.4)
Retic Ct Pct: 6.8 % — ABNORMAL HIGH (ref 0.6–2.6)
Sodium: 139 mmol/L (ref 134–144)
Total Protein: 7 g/dL (ref 6.0–8.5)
Vit D, 25-Hydroxy: 9.6 ng/mL — ABNORMAL LOW (ref 30.0–100.0)
WBC: 6.6 10*3/uL (ref 3.4–10.8)
eGFR: 119 mL/min/{1.73_m2} (ref >59)

## 2022-03-04 ENCOUNTER — Telehealth: Payer: Self-pay

## 2022-03-04 NOTE — Telephone Encounter (Signed)
Hydrocodone

## 2022-03-05 ENCOUNTER — Other Ambulatory Visit: Payer: Self-pay | Admitting: Family Medicine

## 2022-03-05 DIAGNOSIS — D5700 Hb-SS disease with crisis, unspecified: Secondary | ICD-10-CM

## 2022-03-05 MED ORDER — OXYCODONE HCL 10 MG PO TABS: 10.0000 mg | ORAL_TABLET | Freq: Four times a day (QID) | ORAL | 0 refills | Status: DC | PRN

## 2022-03-05 NOTE — Progress Notes (Signed)
Reviewed PDMP substance reporting system prior to prescribing opiate medications. No inconsistencies noted.  Meds ordered this encounter  Medications   Oxycodone HCl 10 MG TABS    Sig: Take 1 tablet (10 mg total) by mouth every 6 (six) hours as needed.    Dispense:  60 tablet    Refill:  0    Order Specific Question:   Supervising Provider    Answer:   JEGEDE, OLUGBEMIGA E [[id number]]   Myley Bahner Moore Carlynn Leduc  APRN, MSN, FNP-C [facility] [address]  [place], [place] [address] [phone]  

## 2022-03-14 ENCOUNTER — Encounter: Payer: Self-pay | Admitting: Family Medicine

## 2022-03-14 NOTE — Progress Notes (Signed)
Virtual Visit via Telephone Note  I connected with William Jennings on 03/14/22 at  1:00 PM EDT by telephone and verified that I am speaking with the correct person using two identifiers.  Location: Patient: Home Provider: [facility]   I discussed the limitations, risks, security and privacy concerns of performing an evaluation and management service by telephone and the availability of in person appointments. I also discussed with the patient that there may be a patient responsible charge related to this service. The patient expressed understanding and agreed to proceed.   History of Present Illness: William Jennings is a 31 year old male with a medical history significant for sickle cell disease, chronic pain syndrome, opiate dependence and tolerance, vitamin D deficiency, and anemia of chronic disease presents via telephone for posthospital follow-up.  Patient states that he was hospitalized 1 week ago and [place] for sickle cell pain crisis at [facility].  He states that during this crisis he experienced acute vision loss.  He has been under the care of ophthalmology at [facility] for this problem.  Patient's follow-up appointment with ophthalmology as scheduled on 03/04/2022 for stage III retinopathy.  Patient states that vision has mostly returned specialization. Patient continues to have pain primarily to low back and lower extremities since hot realizations.  He states that pain has been poorly controlled on current medication regimen.  He rates pain as 6/10, and aching.  He states that he has been hydrating consistently and taking folic acid.  Patient is not on any disease modifying agents at this time he is not under the care of hematology.  He has mostly had well-controlled sickle cell disease with infrequent pain crisis.  He denies any headache, chest pain, shortness of breath, fatigue, urinary symptoms, nausea, vomiting  Past Medical  History:  Diagnosis Date   Chronic prescription opiate use    Sickle cell anemia (HCC)    Sickle cell disease (HCC)     Social History   Socioeconomic History   Marital status: Single    Spouse name: Not on file   Number of children: Not on file   Years of education: Not on file   Highest education level: Not on file  Occupational History   Not on file  Tobacco Use   Smoking status: Former   Smokeless tobacco: Never  Vaping Use   Vaping Use: Never used  Substance and Sexual Activity   Alcohol use: Yes    Comment: occ   Drug use: No   Sexual activity: Not on file  Other Topics Concern   Not on file  Social History Narrative   Not on file   Social Determinants of Health   Financial Resource Strain: Not on file  Food Insecurity: Not on file  Transportation Needs: Not on file  Physical Activity: Not on file  Stress: Not on file  Social Connections: Not on file  Intimate Partner Violence: Not on file   Immunization History  Administered Date(s) Administered   Tdap 08/05/2016      Observations/Objective:   Assessment and Plan: 1. Sickle cell anemia with pain [facility]) Patient is requesting form completion that will be faxed over, will review forms as they become available. Recommend that patient reports to clinic within the next 3 days for labs.  He will call back to schedule a lab appointment. - Sickle Cell Panel; Future  2. Vitamin D deficiency  - Sickle Cell Panel; Future  3.  Retinopathy of both eyes Follow-up with the neurology at [facility] as scheduled.  We will review notes as they become available   Follow Up Instructions:    I discussed the assessment and treatment plan with the patient. The patient was provided an opportunity to ask questions and all were answered. The patient agreed with the plan and demonstrated an understanding of the instructions.   The patient was advised to call back or seek an in-person evaluation if the symptoms  worsen or if the condition fails to improve as anticipated.  I provided 10 minutes of non-face-to-face time during this encounter.   William Pounds  APRN, MSN, FNP-C [name] [address] [place], [place] [address] [phone]

## 2022-03-31 ENCOUNTER — Other Ambulatory Visit: Payer: Self-pay | Admitting: Family Medicine

## 2022-03-31 DIAGNOSIS — E559 Vitamin D deficiency, unspecified: Secondary | ICD-10-CM

## 2022-03-31 MED ORDER — ERGOCALCIFEROL 1.25 MG (50000 UT) PO CAPS: 50000.0000 [IU] | ORAL_CAPSULE | ORAL | 3 refills | Status: DC

## 2022-04-23 ENCOUNTER — Other Ambulatory Visit: Payer: Self-pay | Admitting: Family Medicine

## 2022-04-23 ENCOUNTER — Telehealth: Payer: Self-pay | Admitting: Family Medicine

## 2022-04-23 DIAGNOSIS — D5700 Hb-SS disease with crisis, unspecified: Secondary | ICD-10-CM

## 2022-04-23 MED ORDER — OXYCODONE HCL 10 MG PO TABS: 10.0000 mg | ORAL_TABLET | Freq: Four times a day (QID) | ORAL | 0 refills | Status: DC | PRN

## 2022-04-23 NOTE — Telephone Encounter (Signed)
Oxycodone 10mg

## 2022-04-23 NOTE — Progress Notes (Signed)
Reviewed PDMP substance reporting system prior to prescribing opiate medications. No inconsistencies noted.  Meds ordered this encounter  Medications   Oxycodone HCl 10 MG TABS    Sig: Take 1 tablet (10 mg total) by mouth every 6 (six) hours as needed.    Dispense:  60 tablet    Refill:  0    Order Specific Question:   Supervising Provider    Answer:   JEGEDE, OLUGBEMIGA E [[id number]]   Persephonie Hegwood Moore Mckynzie Liwanag  APRN, MSN, FNP-C [facility] [address]  [place], [place] [address] [phone]  

## 2022-06-03 ENCOUNTER — Telehealth: Payer: Self-pay | Admitting: Family Medicine

## 2022-06-03 ENCOUNTER — Other Ambulatory Visit: Payer: Self-pay | Admitting: Family Medicine

## 2022-06-03 DIAGNOSIS — D5700 Hb-SS disease with crisis, unspecified: Secondary | ICD-10-CM

## 2022-06-03 MED ORDER — OXYCODONE HCL 10 MG PO TABS: 10.0000 mg | ORAL_TABLET | Freq: Four times a day (QID) | ORAL | 0 refills | Status: DC | PRN

## 2022-06-03 NOTE — Progress Notes (Signed)
Reviewed PDMP substance reporting system prior to prescribing opiate medications. No inconsistencies noted.  Meds ordered this encounter  Medications   Oxycodone HCl 10 MG TABS    Sig: Take 1 tablet (10 mg total) by mouth every 6 (six) hours as needed.    Dispense:  60 tablet    Refill:  0    Order Specific Question:   Supervising Provider    Answer:   JEGEDE, OLUGBEMIGA E [[id number]]   Chandra Asher Moore Kym Fenter  APRN, MSN, FNP-C [facility] [address]  [place], [place] [address] [phone]  

## 2022-06-03 NOTE — Telephone Encounter (Signed)
Caller & Relationship to patient:  MRN #  [id number]   Call Back Number:   Date of Last Office Visit: 04/23/2022     Date of Next Office Visit: 06/22/2022    Medication(s) to be Refilled: Oxycodone   Preferred Pharmacy:   ** Please notify patient to allow 48-72 hours to process** **Let patient know to contact pharmacy at the end of the day to make sure medication is ready. ** **If patient has not been seen in a year or longer, book an appointment **Advise to use MyChart for refill requests OR to contact their pharmacy

## 2022-06-22 ENCOUNTER — Ambulatory Visit: Payer: Self-pay | Admitting: Family Medicine

## 2022-07-30 ENCOUNTER — Emergency Department (HOSPITAL_BASED_OUTPATIENT_CLINIC_OR_DEPARTMENT_OTHER)
Admission: EM | Admit: 2022-07-30 | Discharge: 2022-07-30 | Disposition: A | Discharge status: Home / Self Care | Payer: Self-pay | Attending: Emergency Medicine | Admitting: Emergency Medicine

## 2022-07-30 ENCOUNTER — Other Ambulatory Visit: Payer: Self-pay

## 2022-07-30 ENCOUNTER — Encounter (HOSPITAL_BASED_OUTPATIENT_CLINIC_OR_DEPARTMENT_OTHER): Payer: Self-pay

## 2022-07-30 DIAGNOSIS — R112 Nausea with vomiting, unspecified: Secondary | ICD-10-CM | POA: Insufficient documentation

## 2022-07-30 DIAGNOSIS — Z1152 Encounter for screening for COVID-19: Secondary | ICD-10-CM | POA: Insufficient documentation

## 2022-07-30 DIAGNOSIS — K529 Noninfective gastroenteritis and colitis, unspecified: Secondary | ICD-10-CM

## 2022-07-30 DIAGNOSIS — D72829 Elevated white blood cell count, unspecified: Secondary | ICD-10-CM | POA: Insufficient documentation

## 2022-07-30 DIAGNOSIS — J45909 Unspecified asthma, uncomplicated: Secondary | ICD-10-CM | POA: Insufficient documentation

## 2022-07-30 LAB — BASIC METABOLIC PANEL
Anion gap: 4 — ABNORMAL LOW (ref 5–15)
BUN: 11 mg/dL (ref 6–20)
CO2: 22 mmol/L (ref 22–32)
Calcium: 9 mg/dL (ref 8.9–10.3)
Chloride: 109 mmol/L (ref 98–111)
Creatinine, Ser: 0.86 mg/dL (ref 0.61–1.24)
GFR, Estimated: >60 mL/min (ref >60)
Glucose, Bld: 125 mg/dL — ABNORMAL HIGH (ref 70–99)
Potassium: 4.1 mmol/L (ref 3.5–5.1)
Sodium: 135 mmol/L (ref 135–145)

## 2022-07-30 LAB — CBC WITH DIFFERENTIAL/PLATELET
Abs Immature Granulocytes: 0.07 10*3/uL (ref 0.00–0.07)
Basophils Absolute: 0 10*3/uL (ref 0.0–0.1)
Basophils Relative: 0 %
Eosinophils Absolute: 0 10*3/uL (ref 0.0–0.5)
Eosinophils Relative: 0 %
HCT: 35 % — ABNORMAL LOW (ref 39.0–52.0)
Hemoglobin: 12.9 g/dL — ABNORMAL LOW (ref 13.0–17.0)
Immature Granulocytes: 1 %
Lymphocytes Relative: 3 %
Lymphs Abs: 0.5 10*3/uL — ABNORMAL LOW (ref 0.7–4.0)
MCH: 28.5 pg (ref 26.0–34.0)
MCHC: 36.9 g/dL — ABNORMAL HIGH (ref 30.0–36.0)
MCV: 77.4 fL — ABNORMAL LOW (ref 80.0–100.0)
Monocytes Absolute: 0.7 10*3/uL (ref 0.1–1.0)
Monocytes Relative: 4 %
Neutro Abs: 13.9 10*3/uL — ABNORMAL HIGH (ref 1.7–7.7)
Neutrophils Relative %: 92 %
Platelets: 216 10*3/uL (ref 150–400)
RBC: 4.52 MIL/uL (ref 4.22–5.81)
RDW: 14.4 % (ref 11.5–15.5)
WBC: 15.1 10*3/uL — ABNORMAL HIGH (ref 4.0–10.5)
nRBC: 0.3 % — ABNORMAL HIGH (ref 0.0–0.2)

## 2022-07-30 LAB — RESP PANEL BY RT-PCR (RSV, FLU A&B, COVID)  RVPGX2
Influenza A by PCR: NEGATIVE
Influenza B by PCR: NEGATIVE
Resp Syncytial Virus by PCR: NEGATIVE
SARS Coronavirus 2 by RT PCR: NEGATIVE

## 2022-07-30 MED ORDER — KETOROLAC TROMETHAMINE 30 MG/ML IJ SOLN
30.0000 mg | Freq: Once | INTRAMUSCULAR | Status: AC
  Administered 2022-07-30: 30 mg via INTRAVENOUS
  Filled 2022-07-30: qty 1

## 2022-07-30 MED ORDER — ONDANSETRON HCL 4 MG/2ML IJ SOLN
4.0000 mg | Freq: Once | INTRAMUSCULAR | Status: AC
  Administered 2022-07-30: 4 mg via INTRAVENOUS
  Filled 2022-07-30: qty 2

## 2022-07-30 MED ORDER — SODIUM CHLORIDE 0.9 % IV BOLUS
1000.0000 mL | Freq: Once | INTRAVENOUS | Status: AC
  Administered 2022-07-30: 1000 mL via INTRAVENOUS

## 2022-07-30 MED ORDER — ONDANSETRON 8 MG PO TBDP: ORAL_TABLET | ORAL | 0 refills | Status: DC

## 2022-07-30 NOTE — Discharge Instructions (Addendum)
Begin taking Zofran as prescribed as needed for nausea.  Clear liquid diet for the next 12 hours, then slowly advance to normal as tolerated.  Return to the emergency department if you develop severe abdominal pain, high fevers, bloody stools, or for other new and concerning symptoms.

## 2022-07-30 NOTE — ED Provider Notes (Signed)
[place] [facility] Provider Note   CSN: [id number] Arrival date & time: 07/30/22  [id number]     History  Chief Complaint  Patient presents with   Emesis    William Jennings is a 32 y.o. male.  Patient is a 32 year old male with history of sickle cell disease and asthma.  Patient presenting today for evaluation of nausea and vomiting.  He also describes body aches and chills.  This started acutely approximately 2 hours prior to presentation.  He went to bed feeling well, then woke up and has vomited multiple times.  He denies any diarrhea or abdominal pain.  He denies having consumed any undercooked or suspicious foods.  He does report that his daughter tested positive for COVID yesterday.  The history is provided by the patient.       Home Medications Prior to Admission medications   Medication Sig Start Date End Date Taking? Authorizing Provider  albuterol (VENTOLIN HFA) 108 (90 Base) MCG/ACT inhaler Inhale 2 puffs into the lungs every 6 (six) hours as needed for wheezing or shortness of breath. 11/18/21   Fenton Foy, NP  ergocalciferol (VITAMIN D2) 1.25 MG (50000 UT) capsule Take 1 capsule (50,000 Units total) by mouth once a week. 03/31/22   Dorena Dew, FNP  folic acid (FOLVITE) 1 MG tablet Take 1 tablet (1 mg total) by mouth daily. Patient not taking: Reported on 11/18/2021 03/06/19   Dorena Dew, FNP  ibuprofen (ADVIL) 800 MG tablet Take 1 tablet (800 mg total) by mouth every 6 (six) hours as needed for moderate pain. 11/18/21   Fenton Foy, NP  Oxycodone HCl 10 MG TABS Take 1 tablet (10 mg total) by mouth every 6 (six) hours as needed. 06/03/22   Dorena Dew, FNP      Allergies    Shellfish allergy    Review of Systems   Review of Systems  All other systems reviewed and are negative.   Physical Exam Updated Vital Signs BP 125/78 (BP Location: Right Arm)   Pulse (!) 102   Temp 99.1 F (37.3 C) (Oral)   Resp  20   Ht 5' 8"$  (1.727 m)   Wt 68 kg   SpO2 100%   BMI 22.81 kg/m  Physical Exam Vitals and nursing note reviewed.  Constitutional:      General: He is not in acute distress.    Appearance: He is well-developed. He is not diaphoretic.  HENT:     Head: Normocephalic and atraumatic.  Cardiovascular:     Rate and Rhythm: Normal rate and regular rhythm.     Heart sounds: No murmur heard.    No friction rub.  Pulmonary:     Effort: Pulmonary effort is normal. No respiratory distress.     Breath sounds: Normal breath sounds. No wheezing or rales.  Abdominal:     General: Bowel sounds are normal. There is no distension.     Palpations: Abdomen is soft.     Tenderness: There is no abdominal tenderness.  Musculoskeletal:        General: Normal range of motion.     Cervical back: Normal range of motion and neck supple.  Skin:    General: Skin is warm and dry.  Neurological:     Mental Status: He is alert and oriented to person, place, and time.     Coordination: Coordination normal.     ED Results / Procedures / Treatments  Labs (all labs ordered are listed, but only abnormal results are displayed) Labs Reviewed - No data to display  EKG None  Radiology No results found.  Procedures Procedures    Medications Ordered in ED Medications  sodium chloride 0.9 % bolus 1,000 mL (has no administration in time range)  ondansetron (ZOFRAN) injection 4 mg (has no administration in time range)  ketorolac (TORADOL) 30 MG/ML injection 30 mg (has no administration in time range)    ED Course/ Medical Decision Making/ A&P  Patient presenting with complaints of nausea and vomiting that started acutely this evening.  He arrives here with stable vital signs and physical examination which is basically unremarkable.  His abdominal exam is benign.  Laboratory studies obtained including CBC and basic metabolic panel.  He has a slight leukocytosis with white count of 15,000, but is  otherwise unremarkable.  COVID/influenza/RSV testing all negative.  Patient was given normal saline along with Toradol and Zofran for his abdominal issues.  He is now feeling significantly improved.  He has been able to tolerate oral intake and I believe can safely be discharged.  Symptoms most likely related to a stomach virus or possibly foodborne illness.  Either way, feels that this will be self-limited and improve in the next 1 to 2 days.  Patient will be given Zofran he can take for his nausea and is to return as needed for any problems.  I doubt surgical issue such as cholecystitis or appendicitis.  Final Clinical Impression(s) / ED Diagnoses Final diagnoses:  None    Rx / DC Orders ED Discharge Orders     None         Veryl Speak, MD 07/30/22 [phone]

## 2022-07-30 NOTE — ED Triage Notes (Signed)
Pt with acute onset vomiting x2 hours with constant nausea. Pt recently in contact with daughter who was dx with COVID yesterday. No other complaints at this time.

## 2022-08-01 ENCOUNTER — Emergency Department (HOSPITAL_BASED_OUTPATIENT_CLINIC_OR_DEPARTMENT_OTHER)
Admission: EM | Admit: 2022-08-01 | Discharge: 2022-08-01 | Disposition: A | Discharge status: Home / Self Care | Payer: Self-pay | Attending: Emergency Medicine | Admitting: Emergency Medicine

## 2022-08-01 ENCOUNTER — Emergency Department (HOSPITAL_BASED_OUTPATIENT_CLINIC_OR_DEPARTMENT_OTHER): Payer: Self-pay

## 2022-08-01 ENCOUNTER — Other Ambulatory Visit: Payer: Self-pay

## 2022-08-01 ENCOUNTER — Encounter (HOSPITAL_BASED_OUTPATIENT_CLINIC_OR_DEPARTMENT_OTHER): Payer: Self-pay | Admitting: Emergency Medicine

## 2022-08-01 DIAGNOSIS — R066 Hiccough: Secondary | ICD-10-CM | POA: Insufficient documentation

## 2022-08-01 LAB — BASIC METABOLIC PANEL
Anion gap: 6 (ref 5–15)
BUN: 8 mg/dL (ref 6–20)
CO2: 22 mmol/L (ref 22–32)
Calcium: 8.5 mg/dL — ABNORMAL LOW (ref 8.9–10.3)
Chloride: 105 mmol/L (ref 98–111)
Creatinine, Ser: 0.88 mg/dL (ref 0.61–1.24)
GFR, Estimated: >60 mL/min (ref >60)
Glucose, Bld: 106 mg/dL — ABNORMAL HIGH (ref 70–99)
Potassium: 3.4 mmol/L — ABNORMAL LOW (ref 3.5–5.1)
Sodium: 133 mmol/L — ABNORMAL LOW (ref 135–145)

## 2022-08-01 LAB — CBC WITH DIFFERENTIAL/PLATELET
Abs Immature Granulocytes: 0.02 10*3/uL (ref 0.00–0.07)
Basophils Absolute: 0 10*3/uL (ref 0.0–0.1)
Basophils Relative: 0 %
Eosinophils Absolute: 0 10*3/uL (ref 0.0–0.5)
Eosinophils Relative: 0 %
HCT: 33.2 % — ABNORMAL LOW (ref 39.0–52.0)
Hemoglobin: 12.3 g/dL — ABNORMAL LOW (ref 13.0–17.0)
Immature Granulocytes: 0 %
Lymphocytes Relative: 20 %
Lymphs Abs: 1.9 10*3/uL (ref 0.7–4.0)
MCH: 28.9 pg (ref 26.0–34.0)
MCHC: 37 g/dL — ABNORMAL HIGH (ref 30.0–36.0)
MCV: 78.1 fL — ABNORMAL LOW (ref 80.0–100.0)
Monocytes Absolute: 1 10*3/uL (ref 0.1–1.0)
Monocytes Relative: 10 %
Neutro Abs: 6.7 10*3/uL (ref 1.7–7.7)
Neutrophils Relative %: 70 %
Platelets: 144 10*3/uL — ABNORMAL LOW (ref 150–400)
RBC: 4.25 MIL/uL (ref 4.22–5.81)
RDW: 14.3 % (ref 11.5–15.5)
WBC: 9.7 10*3/uL (ref 4.0–10.5)
nRBC: 0.2 % (ref 0.0–0.2)

## 2022-08-01 LAB — TROPONIN I (HIGH SENSITIVITY): Troponin I (High Sensitivity): <2 ng/L (ref <18)

## 2022-08-01 MED ORDER — PANTOPRAZOLE SODIUM 40 MG PO TBEC: 40.0000 mg | DELAYED_RELEASE_TABLET | Freq: Every day | ORAL | 0 refills | Status: DC

## 2022-08-01 MED ORDER — PROCHLORPERAZINE MALEATE 10 MG PO TABS
10.0000 mg | ORAL_TABLET | Freq: Once | ORAL | Status: DC
  Administered 2022-08-01: 10 mg via ORAL
  Filled 2022-08-01: qty 1

## 2022-08-01 MED ORDER — LIDOCAINE VISCOUS HCL 2 % MT SOLN
15.0000 mL | Freq: Once | OROMUCOSAL | Status: AC
  Administered 2022-08-01: 15 mL via ORAL
  Filled 2022-08-01: qty 15

## 2022-08-01 MED ORDER — PROCHLORPERAZINE EDISYLATE 10 MG/2ML IJ SOLN: 10.0000 mg | Freq: Once | INTRAMUSCULAR | Status: DC

## 2022-08-01 MED ORDER — PANTOPRAZOLE SODIUM 40 MG PO TBEC
40.0000 mg | DELAYED_RELEASE_TABLET | Freq: Once | ORAL | Status: AC
  Administered 2022-08-01: 40 mg via ORAL
  Filled 2022-08-01: qty 1

## 2022-08-01 MED ORDER — DIPHENHYDRAMINE HCL 50 MG/ML IJ SOLN
25.0000 mg | Freq: Once | INTRAMUSCULAR | Status: AC
  Administered 2022-08-01: 25 mg via INTRAVENOUS
  Filled 2022-08-01: qty 1

## 2022-08-01 MED ORDER — PROCHLORPERAZINE MALEATE 10 MG PO TABS: 10.0000 mg | ORAL_TABLET | Freq: Two times a day (BID) | ORAL | 0 refills | Status: DC | PRN

## 2022-08-01 MED ORDER — ALUM & MAG HYDROXIDE-SIMETH 200-200-20 MG/5ML PO SUSP
30.0000 mL | Freq: Once | ORAL | Status: AC
  Administered 2022-08-01: 30 mL via ORAL
  Filled 2022-08-01: qty 30

## 2022-08-01 NOTE — Discharge Instructions (Signed)
Compazine and Protonix as prescribed. Benadryl as needed. Follow up with your PCP for recheck.

## 2022-08-01 NOTE — ED Notes (Signed)
No other hiccups he states for a while

## 2022-08-01 NOTE — ED Provider Notes (Signed)
[place] [facility] Provider Note   CSN: [id number] Arrival date & time: 08/01/22  1108     History  Chief Complaint  Patient presents with   Hiccups    NASHAUN VIVERITO is a 32 y.o. male.  32 year old male with past medical history of sickle cell anemia presents with complaint of hiccups x 48 hours.  States that he has hiccups lasting 20 to 40 minutes, occurring about every hour.  Was here in the emergency room 48 hours ago with URI symptoms concerning for COVID, tested negative.  States in between episodes of hiccups he feels well, denies chest pain or difficulty breathing.  Reports 3 episodes of nonbloody, nonbilious emesis 48 hours ago and questions if he may have injured something to cause the hiccups.  No other complaints or concerns.       Home Medications Prior to Admission medications   Medication Sig Start Date End Date Taking? Authorizing Provider  pantoprazole (PROTONIX) 40 MG tablet Take 1 tablet (40 mg total) by mouth daily. 08/01/22  Yes Tacy Learn, PA-C  prochlorperazine (COMPAZINE) 10 MG tablet Take 1 tablet (10 mg total) by mouth 2 (two) times daily as needed for nausea or vomiting. 08/01/22  Yes Tacy Learn, PA-C  albuterol (VENTOLIN HFA) 108 (90 Base) MCG/ACT inhaler Inhale 2 puffs into the lungs every 6 (six) hours as needed for wheezing or shortness of breath. 11/18/21   Fenton Foy, NP  ergocalciferol (VITAMIN D2) 1.25 MG (50000 UT) capsule Take 1 capsule (50,000 Units total) by mouth once a week. 03/31/22   Dorena Dew, FNP  folic acid (FOLVITE) 1 MG tablet Take 1 tablet (1 mg total) by mouth daily. Patient not taking: Reported on 11/18/2021 03/06/19   Dorena Dew, FNP  ibuprofen (ADVIL) 800 MG tablet Take 1 tablet (800 mg total) by mouth every 6 (six) hours as needed for moderate pain. 11/18/21   Fenton Foy, NP  ondansetron (ZOFRAN-ODT) 8 MG disintegrating tablet 38m ODT q4 hours prn nausea 07/30/22    DVeryl Speak MD  Oxycodone HCl 10 MG TABS Take 1 tablet (10 mg total) by mouth every 6 (six) hours as needed. 06/03/22   HDorena Dew FNP      Allergies    Shellfish allergy    Review of Systems   Review of Systems Negative except as per HPI Physical Exam Updated Vital Signs BP 114/73 (BP Location: Right Arm)   Pulse 94   Temp 98.7 F (37.1 C) (Oral)   Resp 14   Ht 5' 8"$  (1.727 m)   Wt 68 kg   SpO2 100%   BMI 22.81 kg/m  Physical Exam Vitals and nursing note reviewed.  Constitutional:      General: He is not in acute distress.    Appearance: He is well-developed. He is not diaphoretic.  HENT:     Head: Normocephalic and atraumatic.     Right Ear: Tympanic membrane and ear canal normal.     Left Ear: Tympanic membrane and ear canal normal.     Nose: Nose normal.     Mouth/Throat:     Mouth: Mucous membranes are moist.     Pharynx: No oropharyngeal exudate or posterior oropharyngeal erythema.  Eyes:     Conjunctiva/sclera: Conjunctivae normal.  Cardiovascular:     Rate and Rhythm: Normal rate and regular rhythm.     Heart sounds: Normal heart sounds.  Pulmonary:  Effort: Pulmonary effort is normal.     Breath sounds: Normal breath sounds.  Musculoskeletal:     Cervical back: Neck supple.  Lymphadenopathy:     Cervical: No cervical adenopathy.  Skin:    General: Skin is warm and dry.     Findings: No erythema or rash.  Neurological:     Mental Status: He is alert and oriented to person, place, and time.  Psychiatric:        Behavior: Behavior normal.     ED Results / Procedures / Treatments   Labs (all labs ordered are listed, but only abnormal results are displayed) Labs Reviewed  CBC WITH DIFFERENTIAL/PLATELET - Abnormal; Notable for the following components:      Result Value   Hemoglobin 12.3 (*)    HCT 33.2 (*)    MCV 78.1 (*)    MCHC 37.0 (*)    Platelets 144 (*)    All other components within normal limits  BASIC METABOLIC PANEL -  Abnormal; Notable for the following components:   Sodium 133 (*)    Potassium 3.4 (*)    Glucose, Bld 106 (*)    Calcium 8.5 (*)    All other components within normal limits  TROPONIN I (HIGH SENSITIVITY)    EKG EKG Interpretation  Date/Time:  Sunday August 01 2022 13:10:50 EST Ventricular Rate:  85 PR Interval:  123 QRS Duration: 78 QT Interval:  347 QTC Calculation: 413 R Axis:   59 Text Interpretation: Sinus rhythm Borderline T wave abnormalities Borderline ST elevation, anterior leads Since prior ECG, TW inversions are new Confirmed by Gareth Morgan [phone]) on 08/01/2022 3:56:51 PM  Radiology DG Chest 2 View  Result Date: 08/01/2022 CLINICAL DATA:  Provided history: Hiccups. EXAM: CHEST - 2 VIEW COMPARISON:  Prior chest radiographs 08/23/2021 and earlier. FINDINGS: Heart size within normal limits. No appreciable airspace consolidation. No evidence of pleural effusion or pneumothorax. Dextrocurvature of the thoracic spine. Multilevel smooth vertebral endplate deformities consistent with sequelae of endplate avascular necrosis in this patient with a history of sickle cell anemia. 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 IMPRESSION: No evidence of acute cardiopulmonary abnormality. Osseous sequelae of sickle cell anemia, as described. Electronically Signed   By: Kellie Simmering D.O.   On: 08/01/2022 13:08    Procedures Procedures    Medications Ordered in ED Medications  prochlorperazine (COMPAZINE) injection 10 mg (0 mg Intravenous Hold 08/01/22 1613)  alum & mag hydroxide-simeth (MAALOX/MYLANTA) 200-200-20 MG/5ML suspension 30 mL (30 mLs Oral Given 08/01/22 1313)    And  lidocaine (XYLOCAINE) 2 % viscous mouth solution 15 mL (15 mLs Oral Given 08/01/22 1313)  pantoprazole (PROTONIX) EC tablet 40 mg (40 mg Oral Given 08/01/22 1532)  diphenhydrAMINE (BENADRYL) injection 25 mg (25 mg Intravenous Given 08/01/22 1611)     ED Course/ Medical Decision Making/ A&P                             Medical Decision Making Amount and/or Complexity of Data Reviewed Labs: ordered. Radiology: ordered.  Risk OTC drugs. Prescription drug management.   This patient presents to the ED for concern of hiccups, this involves an extensive number of treatment options, and is a complaint that carries with it a high risk of complications and morbidity.  The differential diagnosis includes GERD, diaphragm irritation secondary to GERD, arrhythmia, metabolic derangement   Co morbidities that complicate the  patient evaluation  Sickle cell anemia   Additional history obtained:  Additional history obtained from family at bedside who contributes to history as above External records from outside source obtained and reviewed including prior labs on file obtained through ER visit 2 days ago.   Lab Tests:  I Ordered, and personally interpreted labs.  The pertinent results include: CBC without significant findings compared to prior on file.  BMP without significant findings compared to prior on file.  Troponin less than 2   Imaging Studies ordered:  I ordered imaging studies including chest x-ray I independently visualized and interpreted imaging which showed no acute abnormality I agree with the radiologist interpretation   Cardiac Monitoring: / EKG:  The patient was maintained on a cardiac monitor.  I personally viewed and interpreted the cardiac monitored which showed an underlying rhythm of: Sinus rhythm, rate 85, borderline T wave abnormality   Consultations Obtained:  I requested consultation with the ER attending, Dr. Billy Fischer,  and discussed lab and imaging findings as well as pertinent plan - they recommend: Labs and troponin, if troponin is negative, may discharge.  Recommends Compazine, Benadryl, Protonix   Problem List / ED Course / Critical interventions / Medication management  32 year old male  presents with concern for intractable hiccups x 48 hours.  Reports episodes lasting 20 to 40 minutes occurring about every hour.  Onset of symptoms after vomiting illness.  On exam, is well-appearing, no hiccups.  Given GI cocktail, obtain EKG and chest x-ray.  EKG with slightly changed T waves compared to prior on file from 1 year ago, followed with labs which are reassuring.  Chest x-ray is unremarkable.  Symptoms did not improve with GI cocktail, provided with Benadryl, Compazine, Phenergan with resolution of symptoms for the time being.  Patient is discharged with Protonix and Compazine.  Advised can add Benadryl if needed with plan to recheck with PCP. I ordered medication including GI cocktail, Protonix, Compazine, Benadryl for hiccups Reevaluation of the patient after these medicines showed that the patient resolved I have reviewed the patients home medicines and have made adjustments as needed   Social Determinants of Health:  Lives with family, has PCP   Test / Admission - Considered:  Further workup deferred to PCP pending resolution with provided medications.         Final Clinical Impression(s) / ED Diagnoses Final diagnoses:  Hiccups    Rx / DC Orders ED Discharge Orders          Ordered    pantoprazole (PROTONIX) 40 MG tablet  Daily        08/01/22 1633    prochlorperazine (COMPAZINE) 10 MG tablet  2 times daily PRN        08/01/22 1633              Tacy Learn, PA-C 08/01/22 1730    Gareth Morgan, MD 08/01/22 2354

## 2022-08-01 NOTE — ED Triage Notes (Signed)
Pt c/o hiccups every hour x 48 hours.

## 2022-08-03 ENCOUNTER — Ambulatory Visit (INDEPENDENT_AMBULATORY_CARE_PROVIDER_SITE_OTHER): Payer: Self-pay | Admitting: Family Medicine

## 2022-08-03 ENCOUNTER — Encounter: Payer: Self-pay | Admitting: Family Medicine

## 2022-08-03 ENCOUNTER — Non-Acute Institutional Stay (HOSPITAL_COMMUNITY)
Admission: RE | Admit: 2022-08-03 | Discharge: 2022-08-03 | Disposition: A | Discharge status: Home / Self Care | Payer: Self-pay | Source: Ambulatory Visit | Attending: Internal Medicine | Admitting: Internal Medicine

## 2022-08-03 VITALS — BP 109/63 | HR 99 | Temp 98.1°F | Ht 68.0 in | Wt 143.0 lb

## 2022-08-03 DIAGNOSIS — R638 Other symptoms and signs concerning food and fluid intake: Secondary | ICD-10-CM | POA: Insufficient documentation

## 2022-08-03 DIAGNOSIS — E559 Vitamin D deficiency, unspecified: Secondary | ICD-10-CM | POA: Insufficient documentation

## 2022-08-03 DIAGNOSIS — D5700 Hb-SS disease with crisis, unspecified: Secondary | ICD-10-CM

## 2022-08-03 LAB — CBC WITH DIFFERENTIAL/PLATELET
Abs Immature Granulocytes: 0.05 10*3/uL (ref 0.00–0.07)
Basophils Absolute: 0 10*3/uL (ref 0.0–0.1)
Basophils Relative: 0 %
Eosinophils Absolute: 0 10*3/uL (ref 0.0–0.5)
Eosinophils Relative: 0 %
HCT: 33.1 % — ABNORMAL LOW (ref 39.0–52.0)
Hemoglobin: 12 g/dL — ABNORMAL LOW (ref 13.0–17.0)
Immature Granulocytes: 0 %
Lymphocytes Relative: 9 %
Lymphs Abs: 1.2 10*3/uL (ref 0.7–4.0)
MCH: 28.4 pg (ref 26.0–34.0)
MCHC: 36.3 g/dL — ABNORMAL HIGH (ref 30.0–36.0)
MCV: 78.4 fL — ABNORMAL LOW (ref 80.0–100.0)
Monocytes Absolute: 1.5 10*3/uL — ABNORMAL HIGH (ref 0.1–1.0)
Monocytes Relative: 11 %
Neutro Abs: 10.3 10*3/uL — ABNORMAL HIGH (ref 1.7–7.7)
Neutrophils Relative %: 80 %
Platelets: 154 10*3/uL (ref 150–400)
RBC: 4.22 MIL/uL (ref 4.22–5.81)
RDW: 14.1 % (ref 11.5–15.5)
WBC: 13.1 10*3/uL — ABNORMAL HIGH (ref 4.0–10.5)
nRBC: 0 % (ref 0.0–0.2)

## 2022-08-03 LAB — COMPREHENSIVE METABOLIC PANEL
ALT: 28 U/L (ref 0–44)
AST: 42 U/L — ABNORMAL HIGH (ref 15–41)
Albumin: 4.6 g/dL (ref 3.5–5.0)
Alkaline Phosphatase: 82 U/L (ref 38–126)
Anion gap: 7 (ref 5–15)
BUN: 9 mg/dL (ref 6–20)
CO2: 24 mmol/L (ref 22–32)
Calcium: 8.6 mg/dL — ABNORMAL LOW (ref 8.9–10.3)
Chloride: 104 mmol/L (ref 98–111)
Creatinine, Ser: 0.86 mg/dL (ref 0.61–1.24)
GFR, Estimated: >60 mL/min (ref >60)
Glucose, Bld: 93 mg/dL (ref 70–99)
Potassium: 3.9 mmol/L (ref 3.5–5.1)
Sodium: 135 mmol/L (ref 135–145)
Total Bilirubin: 1.9 mg/dL — ABNORMAL HIGH (ref 0.3–1.2)
Total Protein: 8.5 g/dL — ABNORMAL HIGH (ref 6.5–8.1)

## 2022-08-03 LAB — RETICULOCYTES
Immature Retic Fract: 26.7 % — ABNORMAL HIGH (ref 2.3–15.9)
RBC.: 4.31 MIL/uL (ref 4.22–5.81)
Retic Count, Absolute: 151.3 10*3/uL (ref 19.0–186.0)
Retic Ct Pct: 3.5 % — ABNORMAL HIGH (ref 0.4–3.1)

## 2022-08-03 MED ORDER — ONDANSETRON HCL 4 MG/2ML IJ SOLN
4.0000 mg | Freq: Once | INTRAMUSCULAR | Status: AC
  Administered 2022-08-03: 4 mg via INTRAVENOUS
  Filled 2022-08-03: qty 2

## 2022-08-03 MED ORDER — SODIUM CHLORIDE 0.45 % IV BOLUS
1000.0000 mL | Freq: Once | INTRAVENOUS | Status: AC
  Administered 2022-08-03: 1000 mL via INTRAVENOUS

## 2022-08-03 MED ORDER — KETOROLAC TROMETHAMINE 15 MG/ML IJ SOLN
15.0000 mg | Freq: Once | INTRAMUSCULAR | Status: AC
  Administered 2022-08-03: 15 mg via INTRAVENOUS
  Filled 2022-08-03: qty 1

## 2022-08-03 NOTE — Progress Notes (Signed)
PATIENT [facility]ARE CENTER NOTE   Diagnosis: Dehydration symptoms [R63.8]    Provider: [place], [place], FNP   Procedure: IV fluids bolus and lab draw   Note: Patient received 1 liter bolus of 0.45% NS via PIV. Labs drawn (CBC w/diff, CMP and Retic). Patient given 4 mg Zofran IV and 15 mg Toradol IV. Patient tolerated infusion well. Vital signs stable. Discharge instructions given. Patient alert, oriented and ambulatory at discharge.

## 2022-08-03 NOTE — Progress Notes (Signed)
Established Patient Office Visit  Subjective   Patient ID: William Jennings, male    DOB: 1990/12/21  Age: 32 y.o. MRN: [id number]  Chief Complaint  Patient presents with   Follow-up    Vitamin d check and sickle cell    William Jennings is a 32 year old male with a medical history significant for sickle cell disease, chronic pain syndrome, opiate dependence and tolerance, and anemia of chronic disease presents with complaints of fatigue.  Patient was treated and evaluated over the past several days for nausea, vomiting, and hiccups.  He states that hiccups have persisted despite treatment.  He says that he was told he had a "GI bug".  He has not had any sick contacts.  He remembers eating pepperoni pizza prior to nausea and vomiting.  Patient has not had nausea nor vomiting over the past several hours.  No hematemesis or hematochezia.  No urinary symptoms.  No fever, chills, or diarrhea.  No dizziness or shortness of breath.  Patient reports allover body aches and feelings of dehydration.  He reports some dry mouth.    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   Fever 06/29/2012   Leukocytosis 06/29/2012   Past Medical History:  Diagnosis Date   Chronic prescription opiate use    Sickle cell anemia (HCC)    Sickle cell disease ([place])    Past Surgical History:  Procedure Laterality Date   CHOLECYSTECTOMY     Social History   Tobacco Use   Smoking status: Former   Smokeless  tobacco: Never  Scientific laboratory technician Use: Never used  Substance Use Topics   Alcohol use: Not Currently    Comment: occ   Drug use: No   Social History   Socioeconomic History   Marital status: Single    Spouse name: Not on file   Number of children: Not on file   Years of education: Not on file   Highest education level: Not on file  Occupational History   Not on file  Tobacco Use   Smoking status: Former   Smokeless tobacco: Never  Vaping Use   Vaping Use: Never used  Substance and Sexual Activity   Alcohol use: Not Currently    Comment: occ   Drug use: No   Sexual activity: Not on file  Other Topics Concern   Not on file  Social History Narrative   Not on file   Social Determinants of Health   Financial Resource Strain: Not on file  Food Insecurity: Not on file  Transportation Needs: Not on file  Physical Activity: Not on file  Stress: Not on file  Social Connections: Not on file  Intimate Partner Violence: Not on file   Family Status  Relation Name Status   Mother  Alive   Father  Alive   Sister  Alive   Brother  Alive   Sister  Alive   Family History  Family history unknown: Yes   Allergies  Allergen Reactions   Shellfish Allergy Anaphylaxis      Review of Systems  Constitutional:  Positive for malaise/fatigue.  HENT: Negative.    Eyes: Negative.   Respiratory: Negative.    Cardiovascular: Negative.   Gastrointestinal: Negative.   Genitourinary: Negative.   Musculoskeletal:  Positive for back pain.  Skin: Negative.   Neurological: Negative.   Psychiatric/Behavioral: Negative.        Objective:     BP 109/63   Pulse 99   Temp 98.1 F (36.7 C)   Ht 5' 8"$  (1.727 m)   Wt 143 lb (64.9 kg)   SpO2 100%   BMI 21.74 kg/m  BP Readings from Last 3 Encounters:  08/03/22 109/63  08/01/22 114/73  07/30/22 109/78   Wt Readings from Last 3 Encounters:  08/03/22 143 lb (64.9 kg)  08/01/22 150 lb (68 kg)  07/30/22 150 lb (68 kg)       Physical Exam Constitutional:      Appearance: Normal appearance.  Eyes:     Pupils: Pupils are equal, round, and reactive to light.  Cardiovascular:     Rate and Rhythm: Normal rate and regular rhythm.     Pulses: Normal pulses.  Pulmonary:     Effort: Pulmonary effort is normal.  Abdominal:     General: Bowel sounds are normal.  Musculoskeletal:        General: Normal range of motion.  Skin:    General: Skin is warm.  Neurological:     General: No focal deficit present.     Mental Status: He is alert. Mental status is at baseline.  Psychiatric:        Mood and Affect: Mood normal.        Thought Content: Thought content normal.        Judgment: Judgment normal.      No results found for any visits on 08/03/22.  Last CBC Lab Results  Component Value Date   WBC 9.7 08/01/2022   HGB 12.3 (L) 08/01/2022   HCT 33.2 (L) 08/01/2022   MCV 78.1 (L) 08/01/2022   MCH 28.9 08/01/2022   RDW 14.3 08/01/2022   PLT 144 (L) 123456   Last metabolic panel Lab Results  Component Value Date   GLUCOSE 106 (H) 08/01/2022   NA 133 (L) 08/01/2022   K 3.4 (L) 08/01/2022   CL 105 08/01/2022   CO2 22 08/01/2022   BUN 8 08/01/2022   CREATININE 0.88 08/01/2022   GFRNONAA >60 08/01/2022   CALCIUM 8.5 (L) 08/01/2022   PROT 7.0 02/24/2022   ALBUMIN 4.2 02/24/2022   LABGLOB 2.8 02/24/2022   AGRATIO 1.5 02/24/2022   BILITOT 1.2 02/24/2022   ALKPHOS 153 (H) 02/24/2022   AST 23 02/24/2022   ALT 20 02/24/2022   ANIONGAP 6 08/01/2022   Last lipids Lab Results  Component Value Date   CHOL 103 11/18/2021   HDL 40 11/18/2021   LDLCALC 51 11/18/2021   TRIG 46 11/18/2021   CHOLHDL 2.6 11/18/2021   Last hemoglobin A1c No results found for: "HGBA1C" Last thyroid functions No results found for: "TSH", "T3TOTAL", "T4TOTAL", "THYROIDAB" Last vitamin D Lab Results  Component Value Date   VD25OH 9.6 (L) 02/24/2022   Last vitamin B12 and Folate Lab Results  Component Value  Date   VITAMINB12 257 02/01/2010   FOLATE  02/01/2010    9.3 (NOTE)  Reference Ranges        Deficient:  0.4 - 3.3 ng/mL        Indeterminate:   3.4 - 5.4 ng/mL        Normal:              > 5.4 ng/mL     The ASCVD Risk score (Arnett DK, et al., 2019) failed to calculate for the following reasons:   The 2019 ASCVD risk score is only valid for ages 36 to 27    Assessment & Plan:   Problem List Items Addressed This Visit       Other   Vitamin D deficiency   Sickle-cell disease with pain ([place])   Dehydration symptoms - Primary   1. Sickle-cell disease with pain [facility]) Patient transition to sickle cell day infusion clinic.  Toradol 15 mg IV and IV fluids. Review CBC with differential, reticulocytes, and complete metabolic panel as results become available. 2. Dehydration symptoms Patient transition to day infusion center for 1 L bolus of 0.45% saline  3. Vitamin D deficiency Will review labs as results become available Return in about 3 months (around 11/01/2022).     [name]  APRN, MSN, FNP-C Patient [facility] [address] [place], [place] [address] [phone]

## 2022-08-05 ENCOUNTER — Telehealth: Payer: Self-pay

## 2022-08-07 ENCOUNTER — Other Ambulatory Visit: Payer: Self-pay

## 2022-08-07 ENCOUNTER — Encounter (HOSPITAL_BASED_OUTPATIENT_CLINIC_OR_DEPARTMENT_OTHER): Payer: Self-pay | Admitting: Emergency Medicine

## 2022-08-07 ENCOUNTER — Emergency Department (HOSPITAL_BASED_OUTPATIENT_CLINIC_OR_DEPARTMENT_OTHER): Payer: Self-pay

## 2022-08-07 ENCOUNTER — Emergency Department (HOSPITAL_BASED_OUTPATIENT_CLINIC_OR_DEPARTMENT_OTHER)
Admission: EM | Admit: 2022-08-07 | Discharge: 2022-08-07 | Disposition: A | Discharge status: Home / Self Care | Payer: Self-pay | Attending: Emergency Medicine | Admitting: Emergency Medicine

## 2022-08-07 DIAGNOSIS — M549 Dorsalgia, unspecified: Secondary | ICD-10-CM | POA: Insufficient documentation

## 2022-08-07 DIAGNOSIS — R079 Chest pain, unspecified: Secondary | ICD-10-CM | POA: Insufficient documentation

## 2022-08-07 DIAGNOSIS — D5700 Hb-SS disease with crisis, unspecified: Secondary | ICD-10-CM | POA: Insufficient documentation

## 2022-08-07 DIAGNOSIS — M79606 Pain in leg, unspecified: Secondary | ICD-10-CM | POA: Insufficient documentation

## 2022-08-07 LAB — CBC WITH DIFFERENTIAL/PLATELET
Abs Immature Granulocytes: 0.52 10*3/uL — ABNORMAL HIGH (ref 0.00–0.07)
Basophils Absolute: 0.1 10*3/uL (ref 0.0–0.1)
Basophils Relative: 1 %
Eosinophils Absolute: 0.1 10*3/uL (ref 0.0–0.5)
Eosinophils Relative: 1 %
HCT: 29.9 % — ABNORMAL LOW (ref 39.0–52.0)
Hemoglobin: 11 g/dL — ABNORMAL LOW (ref 13.0–17.0)
Immature Granulocytes: 4 %
Lymphocytes Relative: 31 %
Lymphs Abs: 4.1 10*3/uL — ABNORMAL HIGH (ref 0.7–4.0)
MCH: 28.3 pg (ref 26.0–34.0)
MCHC: 36.8 g/dL — ABNORMAL HIGH (ref 30.0–36.0)
MCV: 76.9 fL — ABNORMAL LOW (ref 80.0–100.0)
Monocytes Absolute: 1.2 10*3/uL — ABNORMAL HIGH (ref 0.1–1.0)
Monocytes Relative: 9 %
Neutro Abs: 7.2 10*3/uL (ref 1.7–7.7)
Neutrophils Relative %: 54 %
Platelets: 205 10*3/uL (ref 150–400)
RBC: 3.89 MIL/uL — ABNORMAL LOW (ref 4.22–5.81)
RDW: 14.9 % (ref 11.5–15.5)
Smear Review: NORMAL
WBC Morphology: >10
WBC: 13.5 10*3/uL — ABNORMAL HIGH (ref 4.0–10.5)
nRBC: 1.9 % — ABNORMAL HIGH (ref 0.0–0.2)

## 2022-08-07 LAB — COMPREHENSIVE METABOLIC PANEL
ALT: 28 U/L (ref 0–44)
AST: 51 U/L — ABNORMAL HIGH (ref 15–41)
Albumin: 3.9 g/dL (ref 3.5–5.0)
Alkaline Phosphatase: 100 U/L (ref 38–126)
Anion gap: 3 — ABNORMAL LOW (ref 5–15)
BUN: 8 mg/dL (ref 6–20)
CO2: 26 mmol/L (ref 22–32)
Calcium: 8.5 mg/dL — ABNORMAL LOW (ref 8.9–10.3)
Chloride: 108 mmol/L (ref 98–111)
Creatinine, Ser: 1.18 mg/dL (ref 0.61–1.24)
GFR, Estimated: >60 mL/min (ref >60)
Glucose, Bld: 82 mg/dL (ref 70–99)
Potassium: 4.7 mmol/L (ref 3.5–5.1)
Sodium: 137 mmol/L (ref 135–145)
Total Bilirubin: 1.2 mg/dL (ref 0.3–1.2)
Total Protein: 7.6 g/dL (ref 6.5–8.1)

## 2022-08-07 LAB — TROPONIN I (HIGH SENSITIVITY): Troponin I (High Sensitivity): 2 ng/L (ref <18)

## 2022-08-07 LAB — RETICULOCYTES
Immature Retic Fract: 44.8 % — ABNORMAL HIGH (ref 2.3–15.9)
RBC.: 3.95 MIL/uL — ABNORMAL LOW (ref 4.22–5.81)
Retic Count, Absolute: 173 10*3/uL (ref 19.0–186.0)
Retic Ct Pct: 4.4 % — ABNORMAL HIGH (ref 0.4–3.1)

## 2022-08-07 MED ORDER — ONDANSETRON HCL 4 MG/2ML IJ SOLN
4.0000 mg | INTRAMUSCULAR | Status: DC | PRN
  Administered 2022-08-07: 4 mg via INTRAVENOUS
  Filled 2022-08-07: qty 2

## 2022-08-07 MED ORDER — HYDROMORPHONE HCL 1 MG/ML IJ SOLN
2.0000 mg | INTRAMUSCULAR | Status: AC
  Administered 2022-08-07: 2 mg via INTRAVENOUS
  Filled 2022-08-07: qty 2

## 2022-08-07 MED ORDER — DEXTROSE-NACL 5-0.45 % IV SOLN: INTRAVENOUS | Status: DC

## 2022-08-07 NOTE — ED Provider Notes (Signed)
[facility] Provider Note   CSN: [id number] Arrival date & time: 08/07/22  1634     History {Add pertinent medical, surgical, social history, OB history to HPI:1} Chief Complaint  Patient presents with   Sickle Cell Pain Crisis    William Jennings is a 32 y.o. male.  HPI     32 year old male with history of sickle cell disease presents to concern for neck, back, left arm and left leg pain.  Reports this is times began last night.  Reports having similar symptoms with sickle cell pain crises in the past, including presence of chest pain, back pain and leg pain.  Pain is worse with movement, palpation.  Tried taking his home medications without relief.  Denies any shortness of breath, cough, fever.  The pain was 8 out of 10.  Past Medical History:  Diagnosis Date   Chronic prescription opiate use    Sickle cell anemia (HCC)    Sickle cell disease ([place])      Home Medications Prior to Admission medications   Medication Sig Start Date End Date Taking? Authorizing Provider  albuterol (VENTOLIN HFA) 108 (90 Base) MCG/ACT inhaler Inhale 2 puffs into the lungs every 6 (six) hours as needed for wheezing or shortness of breath. Patient not taking: Reported on 08/03/2022 11/18/21   Fenton Foy, NP  ergocalciferol (VITAMIN D2) 1.25 MG (50000 UT) capsule Take 1 capsule (50,000 Units total) by mouth once a week. Patient not taking: Reported on 08/03/2022 03/31/22   Dorena Dew, FNP  folic acid (FOLVITE) 1 MG tablet Take 1 tablet (1 mg total) by mouth daily. Patient not taking: Reported on 11/18/2021 03/06/19   Dorena Dew, FNP  ibuprofen (ADVIL) 800 MG tablet Take 1 tablet (800 mg total) by mouth every 6 (six) hours as needed for moderate pain. 11/18/21   Fenton Foy, NP  Oxycodone HCl 10 MG TABS Take 1 tablet (10 mg total) by mouth every 6 (six) hours as needed. 06/03/22   Dorena Dew, FNP      Allergies    Shellfish  allergy    Review of Systems   Review of Systems  Physical Exam Updated Vital Signs BP 109/76 (BP Location: Left Arm)   Pulse (!) 107   Temp (!) 97.4 F (36.3 C) (Oral)   Resp 18   Ht '5\' 8"'$  (1.727 m)   Wt 64.9 kg   SpO2 100%   BMI 21.74 kg/m  Physical Exam  ED Results / Procedures / Treatments   Labs (all labs ordered are listed, but only abnormal results are displayed) Labs Reviewed  CBC WITH DIFFERENTIAL/PLATELET  COMPREHENSIVE METABOLIC PANEL  RETICULOCYTES    EKG None  Radiology No results found.  Procedures Procedures  {Document cardiac monitor, telemetry assessment procedure when appropriate:1}  Medications Ordered in ED Medications  dextrose 5 %-0.45 % sodium chloride infusion (has no administration in time range)  HYDROmorphone (DILAUDID) injection 2 mg (has no administration in time range)  HYDROmorphone (DILAUDID) injection 2 mg (has no administration in time range)  HYDROmorphone (DILAUDID) injection 2 mg (has no administration in time range)  ondansetron (ZOFRAN) injection 4 mg (has no administration in time range)    ED Course/ Medical Decision Making/ A&P   {   Click here for ABCD2, HEART and other calculatorsREFRESH Note before signing :1}  32 year old male with history of sickle cell disease presents to concern for neck, back, left arm and left leg pain.  Differential diagnosis includes sickle cell pain crisis, ACS, aortic dissection, pneumothorax, acute chest syndrome, pneumonia.  Labs completed and personally read interpreted by me show a mild leukocytosis, stable hemoglobin, no clinically significant electrolyte abnormalities.  Troponin is within normal limits, and have low suspicion for ACS.  Have low suspicion for PE given similar symptoms with pain crises in the past, no dyspnea, no hypoxia, no asymmetric leg swelling.  He has normal pulses in the upper and lower extremities, similar pain with prior pain crises,  not colicky pain, have low suspicion for aortic dissection.  Chest x-ray was completed and personally about interpreted by me and showed no evidence of pneumonia, pneumothorax, or acute chest syndrome.  EKG personally read by me and shows no changes to suggest pericarditis.  Suspect pain related to sickle cell pain crisis.  He is given IV fluid, Dilaudid, Zofran with***       {Document critical care time when appropriate:1} {Document review of labs and clinical decision tools ie heart score, Chads2Vasc2 etc:1}  {Document your independent review of radiology images, and any outside records:1} {Document your discussion with family members, caretakers, and with consultants:1} {Document social determinants of health affecting pt's care:1} {Document your decision making why or why not admission, treatments were needed:1} Final Clinical Impression(s) / ED Diagnoses Final diagnoses:  None    Rx / DC Orders ED Discharge Orders     None

## 2022-08-07 NOTE — ED Triage Notes (Signed)
Pt reports sickle cell crisis with neck, back, L arm, L leg pain, and slight chest pain. Pt reports pain began last night. No emesis. Tried home medications with no relief

## 2022-08-08 ENCOUNTER — Emergency Department (HOSPITAL_BASED_OUTPATIENT_CLINIC_OR_DEPARTMENT_OTHER)
Admission: EM | Admit: 2022-08-08 | Discharge: 2022-08-08 | Disposition: A | Discharge status: Home / Self Care | Payer: Self-pay | Attending: Emergency Medicine | Admitting: Emergency Medicine

## 2022-08-08 ENCOUNTER — Encounter (HOSPITAL_BASED_OUTPATIENT_CLINIC_OR_DEPARTMENT_OTHER): Payer: Self-pay | Admitting: Emergency Medicine

## 2022-08-08 DIAGNOSIS — D5700 Hb-SS disease with crisis, unspecified: Secondary | ICD-10-CM | POA: Insufficient documentation

## 2022-08-08 MED ORDER — OXYCODONE HCL 5 MG PO TABS
10.0000 mg | ORAL_TABLET | Freq: Once | ORAL | Status: AC
  Administered 2022-08-08: 10 mg via ORAL
  Filled 2022-08-08: qty 2

## 2022-08-08 MED ORDER — HYDROMORPHONE HCL 1 MG/ML IJ SOLN
2.0000 mg | INTRAMUSCULAR | Status: AC
  Administered 2022-08-08: 2 mg via INTRAVENOUS
  Filled 2022-08-08: qty 2

## 2022-08-08 MED ORDER — KETOROLAC TROMETHAMINE 15 MG/ML IJ SOLN
15.0000 mg | Freq: Once | INTRAMUSCULAR | Status: AC
  Administered 2022-08-08: 15 mg via INTRAVENOUS
  Filled 2022-08-08: qty 1

## 2022-08-08 MED ORDER — DIPHENHYDRAMINE HCL 50 MG/ML IJ SOLN
12.5000 mg | Freq: Once | INTRAMUSCULAR | Status: AC
  Administered 2022-08-08: 12.5 mg via INTRAVENOUS
  Filled 2022-08-08: qty 1

## 2022-08-08 NOTE — ED Triage Notes (Signed)
Pt c/o sickle cell crisis pain to bilateral legs x 2 days. Pt seen for the same yesterday.

## 2022-08-08 NOTE — ED Provider Notes (Signed)
[facility] Provider Note   CSN: [id number] Arrival date & time: 08/08/22  [id number]     History No chief complaint on file.   William Jennings is a 32 y.o. male presenting for sickle cell pain crisis.  He reports that it is worse than when he was seen yesterday.  When he left yesterday he was 100% feeling better but it came back overnight.  Has tried his oxycodone and ibuprofen without relief.  Localizes the pain to the bilateral legs and some of his upper left arm.  Full range of motion.  No falls.  No saddle anesthesia, leg weakness or bowel/bladder dysfunction.  HPI     Home Medications Prior to Admission medications   Medication Sig Start Date End Date Taking? Authorizing Provider  albuterol (VENTOLIN HFA) 108 (90 Base) MCG/ACT inhaler Inhale 2 puffs into the lungs every 6 (six) hours as needed for wheezing or shortness of breath. Patient not taking: Reported on 08/03/2022 11/18/21   Fenton Foy, NP  ergocalciferol (VITAMIN D2) 1.25 MG (50000 UT) capsule Take 1 capsule (50,000 Units total) by mouth once a week. Patient not taking: Reported on 08/03/2022 03/31/22   Dorena Dew, FNP  folic acid (FOLVITE) 1 MG tablet Take 1 tablet (1 mg total) by mouth daily. Patient not taking: Reported on 11/18/2021 03/06/19   Dorena Dew, FNP  ibuprofen (ADVIL) 800 MG tablet Take 1 tablet (800 mg total) by mouth every 6 (six) hours as needed for moderate pain. 11/18/21   Fenton Foy, NP  Oxycodone HCl 10 MG TABS Take 1 tablet (10 mg total) by mouth every 6 (six) hours as needed. 06/03/22   Dorena Dew, FNP      Allergies    Shellfish allergy    Review of Systems   Review of Systems  Physical Exam Updated Vital Signs There were no vitals taken for this visit. Physical Exam Vitals and nursing note reviewed.  Constitutional:      Appearance: Normal appearance.  HENT:     Head: Normocephalic and atraumatic.  Eyes:     General:  No scleral icterus.    Conjunctiva/sclera: Conjunctivae normal.  Pulmonary:     Effort: Pulmonary effort is normal. No respiratory distress.  Musculoskeletal:     Comments: Full range of motion of all extremities.  Strong DP and radial pulses.  Normal finger grip and plantarflexion.  Skin:    Findings: No rash.  Neurological:     Mental Status: He is alert.  Psychiatric:        Mood and Affect: Mood normal.     ED Results / Procedures / Treatments   Labs (all labs ordered are listed, but only abnormal results are displayed) Labs Reviewed - No data to display  EKG None  Radiology   Procedures Procedures   Medications Ordered in ED Medications  HYDROmorphone (DILAUDID) injection 2 mg (2 mg Intravenous Given 08/08/22 0920)  HYDROmorphone (DILAUDID) injection 2 mg (2 mg Intravenous Given 08/08/22 0951)  HYDROmorphone (DILAUDID) injection 2 mg (2 mg Intravenous Given 08/08/22 1024)  ketorolac (TORADOL) 15 MG/ML injection 15 mg (15 mg Intravenous Given 08/08/22 0920)  diphenhydrAMINE (BENADRYL) injection 12.5 mg (12.5 mg Intravenous Given 08/08/22 1005)  oxyCODONE (Oxy IR/ROXICODONE) immediate release tablet 10 mg (10 mg Oral Given 08/08/22 1053)    ED Course/ Medical Decision Making/ A&P Clinical Course as of 08/08/22 1106  Sun Aug 08, 2022  1003 Reevaluated to Dilaudid  doses.  Reports itching but pain is coming down.  Reports since around 6 [MR]  1050 Evaluated after 3 doses.  Patient still has pain and would like to ambulate the department to see if he feels comfortable going home.  Given home oxycodone [MR]    Clinical Course User Index [MR] Shenise Wolgamott, Cecilio Asper, PA-C                             Medical Decision Making Risk Prescription drug management.     Past Medical History / Co-morbidities / Social History: Sickle cell   Additional history: Patient seen yesterday   Physical Exam: Pertinent physical exam findings include TTP bilateral lower legs and left  upper arm.  Not localized to any joint  Lab Tests: Considered however patient had workup yesterday   Imaging Studies: Negative chest x-ray yesterday   Medications: Dilaudid x 3 and Toradol Benadryl for itching   On reevaluation patient reports feeling better   MDM/Disposition: This is a 32 year old male presenting today for sickle cell pain crises.  Second visit in the last 24 hours.  After 3 rounds of Dilaudid, home oxycodone and Toradol patient reports he would prefer to be discharged home then admitted to the hospital.  Return precautions were given and it was suggested that if he needs further treatment he go to [facility] where he would need to be admitted.    Final Clinical Impression(s) / ED Diagnoses Final diagnoses:  Sickle cell pain crisis ([place])    Rx / DC Orders ED Discharge Orders     None      Results and diagnoses were explained to the patient. Return precautions discussed in full. Patient had no additional questions and expressed complete understanding.   This chart was dictated using voice recognition software.  Despite best efforts to proofread,  errors can occur which can change the documentation meaning.    Rhae Hammock, PA-C 08/08/22 1107    Lennice Sites, DO 08/08/22 1239

## 2022-08-08 NOTE — Discharge Instructions (Signed)
Continue with your ibuprofen and oxycodone at home.  If you have any worsening symptoms please return to the [facility] emergency department.  Otherwise follow-up with [place] in the sickle cell clinic.

## 2022-08-09 NOTE — Telephone Encounter (Signed)
Done KH 

## 2022-08-20 ENCOUNTER — Telehealth: Payer: Self-pay | Admitting: Family Medicine

## 2022-08-20 NOTE — Telephone Encounter (Signed)
Caller & Relationship to patient:  MRN #  [id number]   Call Back Number:   Date of Last Office Visit: 08/05/2022     Date of Next Office Visit: 11/02/2022    Medication(s) to be Refilled: ibuprofen and oxycodone   Preferred Pharmacy:   ** Please notify patient to allow 48-72 hours to process** **Let patient know to contact pharmacy at the end of the day to make sure medication is ready. ** **If patient has not been seen in a year or longer, book an appointment **Advise to use MyChart for refill requests OR to contact their pharmacy

## 2022-08-23 ENCOUNTER — Other Ambulatory Visit: Payer: Self-pay | Admitting: Family Medicine

## 2022-08-23 DIAGNOSIS — D5700 Hb-SS disease with crisis, unspecified: Secondary | ICD-10-CM

## 2022-08-23 MED ORDER — IBUPROFEN 800 MG PO TABS: 800.0000 mg | ORAL_TABLET | Freq: Four times a day (QID) | ORAL | 0 refills | Status: DC | PRN

## 2022-08-23 MED ORDER — OXYCODONE HCL 10 MG PO TABS: 10.0000 mg | ORAL_TABLET | Freq: Four times a day (QID) | ORAL | 0 refills | Status: DC | PRN

## 2022-08-23 NOTE — Progress Notes (Signed)
Reviewed PDMP substance reporting system prior to prescribing opiate medications. No inconsistencies noted.  Meds ordered this encounter  Medications   ibuprofen (ADVIL) 800 MG tablet    Sig: Take 1 tablet (800 mg total) by mouth every 6 (six) hours as needed for moderate pain.    Dispense:  21 tablet    Refill:  0    Order Specific Question:   Supervising Provider    Answer:   Tresa Garter [id number]   Oxycodone HCl 10 MG TABS    Sig: Take 1 tablet (10 mg total) by mouth every 6 (six) hours as needed.    Dispense:  60 tablet    Refill:  0    Order Specific Question:   Supervising Provider    Answer:   Tresa Garter [id number]   [name]  APRN, MSN, FNP-C Patient [facility] [address] [place], [place] [address] [phone]

## 2022-10-13 ENCOUNTER — Telehealth: Payer: Self-pay | Admitting: Family Medicine

## 2022-10-13 ENCOUNTER — Other Ambulatory Visit: Payer: Self-pay | Admitting: Family Medicine

## 2022-10-13 DIAGNOSIS — D5700 Hb-SS disease with crisis, unspecified: Secondary | ICD-10-CM

## 2022-10-13 MED ORDER — OXYCODONE HCL 10 MG PO TABS: 10.0000 mg | ORAL_TABLET | Freq: Four times a day (QID) | ORAL | 0 refills | Status: DC | PRN

## 2022-10-13 NOTE — Telephone Encounter (Signed)
Caller & Relationship to patient:  MRN #  [id number]   Call Back Number:   Date of Last Office Visit: 08/20/2022     Date of Next Office Visit: 12/14/2022    Medication(s) to be Refilled: Oxycodone   Preferred Pharmacy:   ** Please notify patient to allow 48-72 hours to process** **Let patient know to contact pharmacy at the end of the day to make sure medication is ready. ** **If patient has not been seen in a year or longer, book an appointment **Advise to use MyChart for refill requests OR to contact their pharmacy

## 2022-10-13 NOTE — Progress Notes (Signed)
Reviewed PDMP substance reporting system prior to prescribing opiate medications. No inconsistencies noted.  Meds ordered this encounter  Medications   Oxycodone HCl 10 MG TABS    Sig: Take 1 tablet (10 mg total) by mouth every 6 (six) hours as needed.    Dispense:  60 tablet    Refill:  0    Order Specific Question:   Supervising Provider    Answer:   JEGEDE, OLUGBEMIGA E [[id number]]   Shana Younge Moore Gerri Acre  APRN, MSN, FNP-C [facility] [address]  [place],  [address] [phone]  

## 2022-11-02 ENCOUNTER — Ambulatory Visit: Payer: Self-pay | Admitting: Family Medicine

## 2022-11-25 ENCOUNTER — Other Ambulatory Visit: Payer: Self-pay | Admitting: Family Medicine

## 2022-11-25 ENCOUNTER — Telehealth: Payer: Self-pay | Admitting: Family Medicine

## 2022-11-25 ENCOUNTER — Other Ambulatory Visit: Payer: Self-pay

## 2022-11-25 DIAGNOSIS — D5700 Hb-SS disease with crisis, unspecified: Secondary | ICD-10-CM

## 2022-11-25 MED ORDER — OXYCODONE HCL 10 MG PO TABS: 10.0000 mg | ORAL_TABLET | Freq: Four times a day (QID) | ORAL | 0 refills | Status: DC | PRN

## 2022-11-25 NOTE — Progress Notes (Signed)
Reviewed PDMP substance reporting system prior to prescribing opiate medications. No inconsistencies noted.  Meds ordered this encounter  Medications   Oxycodone HCl 10 MG TABS    Sig: Take 1 tablet (10 mg total) by mouth every 6 (six) hours as needed.    Dispense:  60 tablet    Refill:  0    Order Specific Question:   Supervising Provider    Answer:   JEGEDE, OLUGBEMIGA E [[id number]   William Gucciardo Moore Candi Profit  APRN, MSN, FNP-C [facility] [address]  [place],  [address] [phone]  

## 2022-11-25 NOTE — Telephone Encounter (Signed)
Sent to provider. KH 

## 2022-11-25 NOTE — Telephone Encounter (Signed)
Caller & Relationship to patient:  MRN #  [id number]   Call Back Number:   Date of Last Office Visit: 10/13/2022     Date of Next Office Visit: 12/14/2022    Medication(s) to be Refilled: Oxycodone   Preferred Pharmacy:   ** Please notify patient to allow 48-72 hours to process** **Let patient know to contact pharmacy at the end of the day to make sure medication is ready. ** **If patient has not been seen in a year or longer, book an appointment **Advise to use MyChart for refill requests OR to contact their pharmacy

## 2022-11-25 NOTE — Telephone Encounter (Signed)
Please advise KH 

## 2022-11-29 ENCOUNTER — Other Ambulatory Visit: Payer: Self-pay

## 2022-11-29 ENCOUNTER — Emergency Department (HOSPITAL_BASED_OUTPATIENT_CLINIC_OR_DEPARTMENT_OTHER)
Admission: EM | Admit: 2022-11-29 | Discharge: 2022-11-29 | Disposition: A | Discharge status: Home / Self Care | Payer: Self-pay | Attending: Emergency Medicine | Admitting: Emergency Medicine

## 2022-11-29 DIAGNOSIS — D5700 Hb-SS disease with crisis, unspecified: Secondary | ICD-10-CM

## 2022-11-29 DIAGNOSIS — D57219 Sickle-cell/Hb-C disease with crisis, unspecified: Secondary | ICD-10-CM | POA: Insufficient documentation

## 2022-11-29 LAB — CBC WITH DIFFERENTIAL/PLATELET
Abs Immature Granulocytes: 0.56 10*3/uL — ABNORMAL HIGH (ref 0.00–0.07)
Basophils Absolute: 0.1 10*3/uL (ref 0.0–0.1)
Basophils Relative: 1 %
Eosinophils Absolute: 0.1 10*3/uL (ref 0.0–0.5)
Eosinophils Relative: 1 %
HCT: 29.4 % — ABNORMAL LOW (ref 39.0–52.0)
Hemoglobin: 10.8 g/dL — ABNORMAL LOW (ref 13.0–17.0)
Immature Granulocytes: 6 %
Lymphocytes Relative: 41 %
Lymphs Abs: 4 10*3/uL (ref 0.7–4.0)
MCH: 27.7 pg (ref 26.0–34.0)
MCHC: 36.7 g/dL — ABNORMAL HIGH (ref 30.0–36.0)
MCV: 75.4 fL — ABNORMAL LOW (ref 80.0–100.0)
Monocytes Absolute: 0.9 10*3/uL (ref 0.1–1.0)
Monocytes Relative: 9 %
Neutro Abs: 4 10*3/uL (ref 1.7–7.7)
Neutrophils Relative %: 42 %
Platelets: 215 10*3/uL (ref 150–400)
RBC: 3.9 MIL/uL — ABNORMAL LOW (ref 4.22–5.81)
RDW: 15.1 % (ref 11.5–15.5)
Smear Review: NORMAL
WBC: 9.7 10*3/uL (ref 4.0–10.5)
nRBC: 1.7 % — ABNORMAL HIGH (ref 0.0–0.2)

## 2022-11-29 LAB — BASIC METABOLIC PANEL
Anion gap: 4 — ABNORMAL LOW (ref 5–15)
BUN: 9 mg/dL (ref 6–20)
CO2: 24 mmol/L (ref 22–32)
Calcium: 8.7 mg/dL — ABNORMAL LOW (ref 8.9–10.3)
Chloride: 108 mmol/L (ref 98–111)
Creatinine, Ser: 0.79 mg/dL (ref 0.61–1.24)
GFR, Estimated: >60 mL/min (ref >60)
Glucose, Bld: 100 mg/dL — ABNORMAL HIGH (ref 70–99)
Potassium: 3.4 mmol/L — ABNORMAL LOW (ref 3.5–5.1)
Sodium: 136 mmol/L (ref 135–145)

## 2022-11-29 MED ORDER — KETOROLAC TROMETHAMINE 30 MG/ML IJ SOLN
30.0000 mg | Freq: Once | INTRAMUSCULAR | Status: AC
  Administered 2022-11-29: 30 mg via INTRAVENOUS
  Filled 2022-11-29: qty 1

## 2022-11-29 MED ORDER — HYDROMORPHONE HCL 1 MG/ML IJ SOLN
1.0000 mg | Freq: Once | INTRAMUSCULAR | Status: AC
  Administered 2022-11-29: 1 mg via INTRAVENOUS
  Filled 2022-11-29: qty 1

## 2022-11-29 MED ORDER — SODIUM CHLORIDE 0.9 % IV BOLUS
1000.0000 mL | Freq: Once | INTRAVENOUS | Status: AC
  Administered 2022-11-29: 1000 mL via INTRAVENOUS

## 2022-11-29 MED ORDER — DIPHENHYDRAMINE HCL 50 MG/ML IJ SOLN
12.5000 mg | Freq: Once | INTRAMUSCULAR | Status: AC
  Administered 2022-11-29: 12.5 mg via INTRAVENOUS
  Filled 2022-11-29: qty 1

## 2022-11-29 MED ORDER — HYDROMORPHONE HCL 1 MG/ML IJ SOLN
2.0000 mg | Freq: Once | INTRAMUSCULAR | Status: AC
  Administered 2022-11-29: 2 mg via INTRAVENOUS
  Filled 2022-11-29: qty 2

## 2022-11-29 MED ORDER — ONDANSETRON HCL 4 MG/2ML IJ SOLN
4.0000 mg | Freq: Once | INTRAMUSCULAR | Status: AC
  Administered 2022-11-29: 4 mg via INTRAVENOUS
  Filled 2022-11-29: qty 2

## 2022-11-29 NOTE — ED Triage Notes (Signed)
Pt arrives with c/o sickle cell crisis that started this morning. Pt c/o right leg and lower back pain. Per pt, oxycodone from home is not working. Pt denies CP.

## 2022-11-29 NOTE — Discharge Instructions (Addendum)
Labs today are at your baseline.  Follow-up with their primary team if needed.  Return if needed.

## 2022-11-29 NOTE — ED Provider Notes (Signed)
[place] [facility] Provider Note   CSN: [id number] Arrival date & time: 11/29/22  1646     History  Chief Complaint  Patient presents with   Sickle Cell Pain Crisis    William Jennings is a 32 y.o. male.  Patient here with pain crisis.  History of sickle cell.  Feels like his typical pain crisis.  No fever chills or chest pain or shortness of breath or abdominal pain.  Pain mostly in his back and legs.  Denies any weakness or chills.  The history is provided by the patient.       Home Medications Prior to Admission medications   Medication Sig Start Date End Date Taking? Authorizing Provider  folic acid (FOLVITE) 1 MG tablet Take 1 tablet (1 mg total) by mouth daily. 03/06/19  Yes William Maroon, FNP  albuterol (VENTOLIN HFA) 108 (90 Base) MCG/ACT inhaler Inhale 2 puffs into the lungs every 6 (six) hours as needed for wheezing or shortness of breath. Patient not taking: Reported on 08/03/2022 11/18/21   William Andrew, NP  ergocalciferol (VITAMIN D2) 1.25 MG (50000 UT) capsule Take 1 capsule (50,000 Units total) by mouth once a week. Patient not taking: Reported on 08/03/2022 03/31/22   William Maroon, FNP  ibuprofen (ADVIL) 800 MG tablet Take 1 tablet (800 mg total) by mouth every 6 (six) hours as needed for moderate pain. 08/23/22   William Maroon, FNP  Oxycodone HCl 10 MG TABS Take 1 tablet (10 mg total) by mouth every 6 (six) hours as needed. 11/25/22   William Maroon, FNP      Allergies    Shellfish allergy    Review of Systems   Review of Systems  Physical Exam Updated Vital Signs BP 97/62   Pulse 92   Temp 98.4 F (36.9 C)   Resp 20   Ht 5\' 8"  (1.727 m)   Wt 70.3 kg   SpO2 97%   BMI 23.57 kg/m  Physical Exam Vitals and nursing note reviewed.  Constitutional:      General: He is not in acute distress.    Appearance: He is well-developed. He is not ill-appearing.  HENT:     Head: Normocephalic and  atraumatic.  Eyes:     Extraocular Movements: Extraocular movements intact.     Conjunctiva/sclera: Conjunctivae normal.     Pupils: Pupils are equal, round, and reactive to light.  Cardiovascular:     Rate and Rhythm: Normal rate and regular rhythm.     Pulses: Normal pulses.     Heart sounds: Normal heart sounds. No murmur heard. Pulmonary:     Effort: Pulmonary effort is normal. No respiratory distress.     Breath sounds: Normal breath sounds.  Abdominal:     Palpations: Abdomen is soft.     Tenderness: There is no abdominal tenderness.  Musculoskeletal:        General: No swelling.     Cervical back: Normal range of motion and neck supple.  Skin:    General: Skin is warm and dry.     Capillary Refill: Capillary refill takes less than 2 seconds.  Neurological:     Mental Status: He is alert.  Psychiatric:        Mood and Affect: Mood normal.     ED Results / Procedures / Treatments   Labs (all labs ordered are listed, but only abnormal results are displayed) Labs Reviewed  CBC WITH DIFFERENTIAL/PLATELET -  Abnormal; Notable for the following components:      Result Value   RBC 3.90 (*)    Hemoglobin 10.8 (*)    HCT 29.4 (*)    MCV 75.4 (*)    MCHC 36.7 (*)    nRBC 1.7 (*)    All other components within normal limits  BASIC METABOLIC PANEL - Abnormal; Notable for the following components:   Potassium 3.4 (*)    Glucose, Bld 100 (*)    Calcium 8.7 (*)    Anion gap 4 (*)    All other components within normal limits    EKG None  Radiology No results found.  Procedures Procedures    Medications Ordered in ED Medications  HYDROmorphone (DILAUDID) injection 1 mg (has no administration in time range)  HYDROmorphone (DILAUDID) injection 2 mg (2 mg Intravenous Given 11/29/22 1802)  diphenhydrAMINE (BENADRYL) injection 12.5 mg (12.5 mg Intravenous Given 11/29/22 1801)  ondansetron (ZOFRAN) injection 4 mg (4 mg Intravenous Given 11/29/22 1801)  sodium chloride 0.9  % bolus 1,000 mL (1,000 mLs Intravenous New Bag/Given 11/29/22 1806)  ketorolac (TORADOL) 30 MG/ML injection 30 mg (30 mg Intravenous Given 11/29/22 1801)    ED Course/ Medical Decision Making/ A&P                             Medical Decision Making Amount and/or Complexity of Data Reviewed Labs: ordered.  Risk Prescription drug management.   [name] is here for pain crises.  Normal vitals.  No fever.  Lab work done unremarkable.  Hemoglobin is 10.8 and baseline.  He still much better after IV Dilaudid, fluids, Toradol.  Discharged in good condition.  Recommend follow-up with his primary doctor.  Understands return precautions.  Have no concern for complicated sickle cell process at this time.  This chart was dictated using voice recognition software.  Despite best efforts to proofread,  errors can occur which can change the documentation meaning.         Final Clinical Impression(s) / ED Diagnoses Final diagnoses:  Sickle cell pain crisis [facility])    Rx / DC Orders ED Discharge Orders     None         Virgina Norfolk, DO 11/29/22 1846

## 2022-11-30 ENCOUNTER — Telehealth (HOSPITAL_COMMUNITY): Payer: Self-pay | Admitting: *Deleted

## 2022-11-30 ENCOUNTER — Encounter (HOSPITAL_COMMUNITY): Payer: Self-pay | Admitting: Family Medicine

## 2022-11-30 ENCOUNTER — Inpatient Hospital Stay (HOSPITAL_COMMUNITY)
Admission: AD | Admit: 2022-11-30 | Discharge: 2022-12-07 | DRG: 812 | Disposition: A | Discharge status: Home / Self Care | Payer: Self-pay | Source: Ambulatory Visit | Attending: Internal Medicine | Admitting: Internal Medicine

## 2022-11-30 DIAGNOSIS — R509 Fever, unspecified: Secondary | ICD-10-CM | POA: Diagnosis not present

## 2022-11-30 DIAGNOSIS — G894 Chronic pain syndrome: Secondary | ICD-10-CM | POA: Diagnosis present

## 2022-11-30 DIAGNOSIS — Z91013 Allergy to seafood: Secondary | ICD-10-CM

## 2022-11-30 DIAGNOSIS — Z9049 Acquired absence of other specified parts of digestive tract: Secondary | ICD-10-CM

## 2022-11-30 DIAGNOSIS — D72829 Elevated white blood cell count, unspecified: Secondary | ICD-10-CM | POA: Diagnosis present

## 2022-11-30 DIAGNOSIS — K047 Periapical abscess without sinus: Secondary | ICD-10-CM | POA: Diagnosis present

## 2022-11-30 DIAGNOSIS — D638 Anemia in other chronic diseases classified elsewhere: Secondary | ICD-10-CM | POA: Diagnosis present

## 2022-11-30 DIAGNOSIS — D5720 Sickle-cell/Hb-C disease without crisis: Secondary | ICD-10-CM

## 2022-11-30 DIAGNOSIS — M545 Low back pain, unspecified: Secondary | ICD-10-CM | POA: Diagnosis present

## 2022-11-30 DIAGNOSIS — D57219 Sickle-cell/Hb-C disease with crisis, unspecified: Principal | ICD-10-CM

## 2022-11-30 DIAGNOSIS — D5700 Hb-SS disease with crisis, unspecified: Principal | ICD-10-CM | POA: Diagnosis present

## 2022-11-30 DIAGNOSIS — Z87891 Personal history of nicotine dependence: Secondary | ICD-10-CM

## 2022-11-30 DIAGNOSIS — Z79899 Other long term (current) drug therapy: Secondary | ICD-10-CM

## 2022-11-30 DIAGNOSIS — K029 Dental caries, unspecified: Secondary | ICD-10-CM | POA: Diagnosis present

## 2022-11-30 DIAGNOSIS — Z79891 Long term (current) use of opiate analgesic: Secondary | ICD-10-CM

## 2022-11-30 LAB — RETICULOCYTES
Immature Retic Fract: 39.5 % — ABNORMAL HIGH (ref 2.3–15.9)
RBC.: 3.9 MIL/uL — ABNORMAL LOW (ref 4.22–5.81)
Retic Count, Absolute: 165.4 10*3/uL (ref 19.0–186.0)
Retic Ct Pct: 4.2 % — ABNORMAL HIGH (ref 0.4–3.1)

## 2022-11-30 LAB — CBC WITH DIFFERENTIAL/PLATELET
Abs Immature Granulocytes: 2.41 10*3/uL — ABNORMAL HIGH (ref 0.00–0.07)
Basophils Absolute: 0.1 10*3/uL (ref 0.0–0.1)
Basophils Relative: 1 %
Eosinophils Absolute: 0.1 10*3/uL (ref 0.0–0.5)
Eosinophils Relative: 0 %
HCT: 30.6 % — ABNORMAL LOW (ref 39.0–52.0)
Hemoglobin: 11.2 g/dL — ABNORMAL LOW (ref 13.0–17.0)
Immature Granulocytes: 13 %
Lymphocytes Relative: 21 %
Lymphs Abs: 3.9 10*3/uL (ref 0.7–4.0)
MCH: 28.5 pg (ref 26.0–34.0)
MCHC: 36.6 g/dL — ABNORMAL HIGH (ref 30.0–36.0)
MCV: 77.9 fL — ABNORMAL LOW (ref 80.0–100.0)
Monocytes Absolute: 1.5 10*3/uL — ABNORMAL HIGH (ref 0.1–1.0)
Monocytes Relative: 8 %
Neutro Abs: 10.1 10*3/uL — ABNORMAL HIGH (ref 1.7–7.7)
Neutrophils Relative %: 57 %
Platelets: 224 10*3/uL (ref 150–400)
RBC: 3.93 MIL/uL — ABNORMAL LOW (ref 4.22–5.81)
RDW: 15.1 % (ref 11.5–15.5)
WBC: 18.1 10*3/uL — ABNORMAL HIGH (ref 4.0–10.5)
nRBC: 2.4 % — ABNORMAL HIGH (ref 0.0–0.2)

## 2022-11-30 LAB — COMPREHENSIVE METABOLIC PANEL
ALT: 20 U/L (ref 0–44)
AST: 37 U/L (ref 15–41)
Albumin: 4.2 g/dL (ref 3.5–5.0)
Alkaline Phosphatase: 125 U/L (ref 38–126)
Anion gap: 7 (ref 5–15)
BUN: 9 mg/dL (ref 6–20)
CO2: 23 mmol/L (ref 22–32)
Calcium: 9 mg/dL (ref 8.9–10.3)
Chloride: 108 mmol/L (ref 98–111)
Creatinine, Ser: 0.95 mg/dL (ref 0.61–1.24)
GFR, Estimated: >60 mL/min (ref >60)
Glucose, Bld: 109 mg/dL — ABNORMAL HIGH (ref 70–99)
Potassium: 4 mmol/L (ref 3.5–5.1)
Sodium: 138 mmol/L (ref 135–145)
Total Bilirubin: 1.8 mg/dL — ABNORMAL HIGH (ref 0.3–1.2)
Total Protein: 7.9 g/dL (ref 6.5–8.1)

## 2022-11-30 MED ORDER — SODIUM CHLORIDE 0.9% FLUSH: 9.0000 mL | INTRAVENOUS | Status: DC | PRN

## 2022-11-30 MED ORDER — SODIUM CHLORIDE 0.45 % IV SOLN: INTRAVENOUS | Status: DC

## 2022-11-30 MED ORDER — ONDANSETRON HCL 4 MG/2ML IJ SOLN
4.0000 mg | Freq: Four times a day (QID) | INTRAMUSCULAR | Status: DC | PRN
  Administered 2022-11-30: 4 mg via INTRAVENOUS
  Filled 2022-11-30: qty 2

## 2022-11-30 MED ORDER — POLYETHYLENE GLYCOL 3350 17 G PO PACK
17.0000 g | PACK | Freq: Every day | ORAL | Status: DC | PRN
  Administered 2022-12-02 – 2022-12-05 (×3): 17 g via ORAL
  Filled 2022-11-30 (×3): qty 1

## 2022-11-30 MED ORDER — SENNOSIDES-DOCUSATE SODIUM 8.6-50 MG PO TABS
1.0000 | ORAL_TABLET | Freq: Two times a day (BID) | ORAL | Status: DC
  Administered 2022-11-30 – 2022-12-06 (×10): 1 via ORAL
  Filled 2022-11-30 (×12): qty 1

## 2022-11-30 MED ORDER — ACETAMINOPHEN 500 MG PO TABS
1000.0000 mg | ORAL_TABLET | Freq: Once | ORAL | Status: AC
  Administered 2022-11-30: 1000 mg via ORAL
  Filled 2022-11-30: qty 2

## 2022-11-30 MED ORDER — ACETAMINOPHEN 500 MG PO TABS
1000.0000 mg | ORAL_TABLET | Freq: Three times a day (TID) | ORAL | Status: DC | PRN
  Administered 2022-11-30 – 2022-12-05 (×5): 1000 mg via ORAL
  Filled 2022-11-30 (×5): qty 2

## 2022-11-30 MED ORDER — KETOROLAC TROMETHAMINE 30 MG/ML IJ SOLN
15.0000 mg | Freq: Once | INTRAMUSCULAR | Status: AC
  Administered 2022-11-30: 15 mg via INTRAVENOUS
  Filled 2022-11-30: qty 1

## 2022-11-30 MED ORDER — HYDROMORPHONE 1 MG/ML IV SOLN
INTRAVENOUS | Status: DC
  Administered 2022-11-30 (×2): 2.5 mg via INTRAVENOUS
  Administered 2022-11-30: 7.5 mg via INTRAVENOUS
  Administered 2022-11-30 – 2022-12-01 (×2): 30 mg via INTRAVENOUS
  Administered 2022-12-01: 1.5 mg via INTRAVENOUS
  Administered 2022-12-01: 1 mg via INTRAVENOUS
  Administered 2022-12-01 (×2): 4 mg via INTRAVENOUS
  Administered 2022-12-01: 2 mg via INTRAVENOUS
  Administered 2022-12-01: 0.5 mg via INTRAVENOUS
  Administered 2022-12-02: 3.5 mg via INTRAVENOUS
  Administered 2022-12-02: 5 mg via INTRAVENOUS
  Administered 2022-12-02: 1 mg via INTRAVENOUS
  Administered 2022-12-02: 3 mg via INTRAVENOUS
  Administered 2022-12-02: 1.5 mg via INTRAVENOUS
  Administered 2022-12-02: 3.5 mg via INTRAVENOUS
  Administered 2022-12-03: 30 mg via INTRAVENOUS
  Administered 2022-12-03: 3 mg via INTRAVENOUS
  Administered 2022-12-03: 2.5 mg via INTRAVENOUS
  Administered 2022-12-03: 1.5 mg via INTRAVENOUS
  Administered 2022-12-03: 4 mg via INTRAVENOUS
  Administered 2022-12-03 – 2022-12-04 (×2): 1 mg via INTRAVENOUS
  Administered 2022-12-04: 2 mg via INTRAVENOUS
  Administered 2022-12-04: 4 mg via INTRAVENOUS
  Administered 2022-12-04: 5 mg via INTRAVENOUS
  Administered 2022-12-04: 2 mg via INTRAVENOUS
  Administered 2022-12-04: 4 mg via INTRAVENOUS
  Administered 2022-12-05: 3 mg via INTRAVENOUS
  Administered 2022-12-05: 3.5 mg via INTRAVENOUS
  Administered 2022-12-05: 4.16 mg via INTRAVENOUS
  Administered 2022-12-05: 1.5 mg via INTRAVENOUS
  Administered 2022-12-05: 2 mg via INTRAVENOUS
  Administered 2022-12-05: 30 mg via INTRAVENOUS
  Administered 2022-12-06: 3 mg via INTRAVENOUS
  Administered 2022-12-06: 4.5 mg via INTRAVENOUS
  Administered 2022-12-06: 2.5 mg via INTRAVENOUS
  Filled 2022-11-30 (×4): qty 30

## 2022-11-30 MED ORDER — ENOXAPARIN SODIUM 40 MG/0.4ML IJ SOSY
40.0000 mg | PREFILLED_SYRINGE | INTRAMUSCULAR | Status: DC
  Administered 2022-11-30 – 2022-12-05 (×6): 40 mg via SUBCUTANEOUS
  Filled 2022-11-30 (×7): qty 0.4

## 2022-11-30 MED ORDER — FOLIC ACID 1 MG PO TABS
1.0000 mg | ORAL_TABLET | Freq: Every day | ORAL | Status: DC
  Administered 2022-11-30 – 2022-12-07 (×8): 1 mg via ORAL
  Filled 2022-11-30 (×8): qty 1

## 2022-11-30 MED ORDER — KETOROLAC TROMETHAMINE 15 MG/ML IJ SOLN
15.0000 mg | Freq: Four times a day (QID) | INTRAMUSCULAR | Status: DC
  Administered 2022-11-30 – 2022-12-05 (×20): 15 mg via INTRAVENOUS
  Filled 2022-11-30 (×19): qty 1

## 2022-11-30 MED ORDER — DIPHENHYDRAMINE HCL 12.5 MG/5ML PO ELIX
25.0000 mg | ORAL_SOLUTION | Freq: Once | ORAL | Status: AC
  Administered 2022-11-30: 25 mg via ORAL
  Filled 2022-11-30 (×2): qty 10

## 2022-11-30 MED ORDER — NALOXONE HCL 0.4 MG/ML IJ SOLN: 0.4000 mg | INTRAMUSCULAR | Status: DC | PRN

## 2022-11-30 MED ORDER — DIPHENHYDRAMINE HCL 25 MG PO CAPS
25.0000 mg | ORAL_CAPSULE | ORAL | Status: DC | PRN
  Filled 2022-11-30: qty 1

## 2022-11-30 NOTE — Progress Notes (Signed)
Patient admitted to day hospital today for sickle cell pain treatment. On arrival, pt reports 10/10 generalized pain. Pt received Dilaudid PCA, IV Toradol, and hydrated with IV fluids via PIV. Pt also received PO Tylenol and PO Benadryl. Due to ongoing pain, pt is being admitted to [address] by [place] Hollis, FNP. At time of transfer, pt rates pain 7/10. PIV remains infusing IV fluids and PCA at time of transfer. PCA settings verified with Danella Maiers, RN. Report given to [place], [place]. Pt is alert, oriented, and transferred in wheel chair to [address], Bed 1 by RN.

## 2022-11-30 NOTE — H&P (Signed)
[facility] History and Physical   Date: 11/30/2022  Patient name: William Jennings Medical record number: [id number] Date of birth: 1991/04/30 Age: 32 y.o. Gender: male PCP: Massie Maroon, FNP  Attending physician: Quentin Angst, MD  Chief Complaint: Sickle cell pain   History of Present Illness: William Jennings is a 32 year old male with a medical history significant for sickle cell disease, chronic pain syndrome, and history of anemia of chronic disease presents with complaint of upper extremity and low back pain for 1 day.  Patient was treated and evaluated in the emergency department on 11/29/2022 for this problem.  He states that pain was somewhat controlled at ER discharge, but returned shortly after returning home.  He has not identified any inciting factors.  He states that he awakened on yesterday with intense pain primarily to low back and lower extremities.  He has been taking his home oxycodone and ibuprofen without very much relief.  He last had oxycodone this a.m.  He denies any fever, chills, chest pain, shortness of breath, urinary symptoms, nausea, vomiting, or diarrhea.  He rates pain as 9/10.  He states that his low back is constant and throbbing.  He has not had any sick contacts, recent travel.  Sickle cell center: Complete metabolic panel shows total bilirubin elevated at 1.8, otherwise unremarkable.  Complete blood count notable for WBCs 18.1, hemoglobin 11.2, and platelets 224,000.  Patient's pain persists despite IV fluids, IV Dilaudid, Toradol, and Tylenol.  Patient will be admitted to [facility] in observation for further management of his sickle cell pain crisis.  Meds: Medications Prior to Admission  Medication Sig Dispense Refill Last Dose   albuterol (VENTOLIN HFA) 108 (90 Base) MCG/ACT inhaler Inhale 2 puffs into the lungs every 6 (six) hours as needed for wheezing or shortness of breath. (Patient not taking: Reported on 08/03/2022)  8 g 0    ergocalciferol (VITAMIN D2) 1.25 MG (50000 UT) capsule Take 1 capsule (50,000 Units total) by mouth once a week. (Patient not taking: Reported on 08/03/2022) 12 capsule 3    folic acid (FOLVITE) 1 MG tablet Take 1 tablet (1 mg total) by mouth daily. 90 tablet 1    ibuprofen (ADVIL) 800 MG tablet Take 1 tablet (800 mg total) by mouth every 6 (six) hours as needed for moderate pain. 21 tablet 0    Oxycodone HCl 10 MG TABS Take 1 tablet (10 mg total) by mouth every 6 (six) hours as needed. 60 tablet 0     Allergies: Shellfish allergy Past Medical History:  Diagnosis Date   Chronic prescription opiate use    Sickle cell anemia (HCC)    Sickle cell disease (HCC)    Past Surgical History:  Procedure Laterality Date   CHOLECYSTECTOMY     Family History  Family history unknown: Yes   Social History   Socioeconomic History   Marital status: Single    Spouse name: Not on file   Number of children: Not on file   Years of education: Not on file   Highest education level: Not on file  Occupational History   Not on file  Tobacco Use   Smoking status: Former   Smokeless tobacco: Never  Vaping Use   Vaping Use: Never used  Substance and Sexual Activity   Alcohol use: Not Currently    Comment: occ   Drug use: No   Sexual activity: Not on file  Other Topics Concern   Not  on file  Social History Narrative   Not on file   Social Determinants of Health   Financial Resource Strain: Not on file  Food Insecurity: Not on file  Transportation Needs: Not on file  Physical Activity: Not on file  Stress: Not on file  Social Connections: Not on file  Intimate Partner Violence: Not on file   Review of Systems  Constitutional: Negative.   HENT: Negative.    Eyes: Negative.   Respiratory: Negative.    Cardiovascular: Negative.   Gastrointestinal: Negative.   Genitourinary: Negative.   Musculoskeletal:  Positive for back pain and joint pain.  Skin: Negative.     Physical  Exam: Blood pressure 122/69, pulse 64, temperature 98.4 F (36.9 C), temperature source Temporal, resp. rate 16, SpO2 100 %. Physical Exam Constitutional:      Appearance: Normal appearance.  Eyes:     Pupils: Pupils are equal, round, and reactive to light.  Cardiovascular:     Rate and Rhythm: Normal rate and regular rhythm.     Pulses: Normal pulses.  Pulmonary:     Effort: Pulmonary effort is normal.  Abdominal:     General: Bowel sounds are normal.  Musculoskeletal:        General: Normal range of motion.  Skin:    General: Skin is warm.  Neurological:     General: No focal deficit present.     Mental Status: He is alert. Mental status is at baseline.      Lab results: Results for orders placed or performed during the hospital encounter of 11/29/22 (from the past 24 hour(s))  CBC with Differential     Status: Abnormal   Collection Time: 11/29/22  5:09 PM  Result Value Ref Range   WBC 9.7 4.0 - 10.5 K/uL   RBC 3.90 (L) 4.22 - 5.81 MIL/uL   Hemoglobin 10.8 (L) 13.0 - 17.0 g/dL   HCT 09.8 (L) 11.9 - 14.7 %   MCV 75.4 (L) 80.0 - 100.0 fL   MCH 27.7 26.0 - 34.0 pg   MCHC 36.7 (H) 30.0 - 36.0 g/dL   RDW 82.9 56.2 - 13.0 %   Platelets 215 150 - 400 K/uL   nRBC 1.7 (H) 0.0 - 0.2 %   Neutrophils Relative % 42 %   Neutro Abs 4.0 1.7 - 7.7 K/uL   Lymphocytes Relative 41 %   Lymphs Abs 4.0 0.7 - 4.0 K/uL   Monocytes Relative 9 %   Monocytes Absolute 0.9 0.1 - 1.0 K/uL   Eosinophils Relative 1 %   Eosinophils Absolute 0.1 0.0 - 0.5 K/uL   Basophils Relative 1 %   Basophils Absolute 0.1 0.0 - 0.1 K/uL   WBC Morphology MORPHOLOGY UNREMARKABLE    Smear Review Normal platelet morphology    Immature Granulocytes 6 %   Abs Immature Granulocytes 0.56 (H) 0.00 - 0.07 K/uL   Polychromasia PRESENT    Target Cells PRESENT   Basic metabolic panel     Status: Abnormal   Collection Time: 11/29/22  5:09 PM  Result Value Ref Range   Sodium 136 135 - 145 mmol/L   Potassium 3.4 (L)  3.5 - 5.1 mmol/L   Chloride 108 98 - 111 mmol/L   CO2 24 22 - 32 mmol/L   Glucose, Bld 100 (H) 70 - 99 mg/dL   BUN 9 6 - 20 mg/dL   Creatinine, Ser 8.65 0.61 - 1.24 mg/dL   Calcium 8.7 (L) 8.9 - 10.3 mg/dL   GFR,  Estimated >60 >60 mL/min   Anion gap 4 (L) 5 - 15    Imaging results:  No results found.   Assessment & Plan: Sickle cell disease with pain crisis: Admit to [facility].  Will continue IV Dilaudid PCA with settings of 0.5 mg, 10-minute lockout, and 3 mg/h.  Patient is opiate tolerant.  Toradol 15 mg IV every 6 hours Tylenol 650 mg every 4 hours as needed. Oxycodone 10 mg every 6 hours as needed for severe breakthrough pain IV fluids, 0.45% saline at 100 mL/h.  Anemia of chronic disease: Hemoglobin is 11.2 g/dL, consistent with patient's baseline.  There is no clinical indication for blood transfusion at this time.  Continue to monitor closely.  Chronic pain: Will continue oxycodone 10 mg every 6 hours as needed.  Leukocytosis: WBCs elevated at 18.1.  More than likely reactive secondary to sickle cell crisis.  Will continue to monitor closely.  Labs in AM.    VTE: Lovenox and SCDs   [name]  APRN, MSN, FNP-C Patient Care [facility] [address] [place], [place] [address] [phone]  11/30/2022, 9:54 AM

## 2022-11-30 NOTE — Telephone Encounter (Signed)
Patient called requesting to come to the day hospital for sickle cell pain. Patient reports back and bilateral leg pain rated 10/10. Reports taking Oxycodone and Ibuprofen at 4:00 am. Patient admits to some chest pain that radiates from his back. Denies fever, nausea, vomiting, diarrhea, abdominal pain and priapism. Admits to having transportation without driving self. [place], FNP notified. Patient can come to the day hospital for pain management. Patient advised and expresses an understanding.

## 2022-12-01 ENCOUNTER — Inpatient Hospital Stay (HOSPITAL_COMMUNITY): Payer: Self-pay

## 2022-12-01 LAB — BASIC METABOLIC PANEL
Anion gap: 7 (ref 5–15)
BUN: 8 mg/dL (ref 6–20)
CO2: 23 mmol/L (ref 22–32)
Calcium: 8.6 mg/dL — ABNORMAL LOW (ref 8.9–10.3)
Chloride: 104 mmol/L (ref 98–111)
Creatinine, Ser: 0.84 mg/dL (ref 0.61–1.24)
GFR, Estimated: >60 mL/min (ref >60)
Glucose, Bld: 107 mg/dL — ABNORMAL HIGH (ref 70–99)
Potassium: 3.8 mmol/L (ref 3.5–5.1)
Sodium: 134 mmol/L — ABNORMAL LOW (ref 135–145)

## 2022-12-01 LAB — CBC
HCT: 28 % — ABNORMAL LOW (ref 39.0–52.0)
Hemoglobin: 10 g/dL — ABNORMAL LOW (ref 13.0–17.0)
MCH: 27.8 pg (ref 26.0–34.0)
MCHC: 35.7 g/dL (ref 30.0–36.0)
MCV: 77.8 fL — ABNORMAL LOW (ref 80.0–100.0)
Platelets: 143 10*3/uL — ABNORMAL LOW (ref 150–400)
RBC: 3.6 MIL/uL — ABNORMAL LOW (ref 4.22–5.81)
RDW: 15.1 % (ref 11.5–15.5)
WBC: 17.2 10*3/uL — ABNORMAL HIGH (ref 4.0–10.5)
nRBC: 13.7 % — ABNORMAL HIGH (ref 0.0–0.2)

## 2022-12-01 LAB — HIV ANTIBODY (ROUTINE TESTING W REFLEX): HIV Screen 4th Generation wRfx: NONREACTIVE

## 2022-12-01 MED ORDER — OXYCODONE HCL 5 MG PO TABS
10.0000 mg | ORAL_TABLET | Freq: Four times a day (QID) | ORAL | Status: DC | PRN
  Administered 2022-12-01 – 2022-12-05 (×7): 10 mg via ORAL
  Filled 2022-12-01 (×7): qty 2

## 2022-12-01 MED ORDER — BUTALBITAL-APAP-CAFFEINE 50-325-40 MG PO TABS
1.0000 | ORAL_TABLET | ORAL | Status: DC | PRN
  Administered 2022-12-02 (×2): 1 via ORAL
  Filled 2022-12-01 (×2): qty 1

## 2022-12-01 MED ORDER — DIPHENHYDRAMINE HCL 12.5 MG/5ML PO ELIX
25.0000 mg | ORAL_SOLUTION | ORAL | Status: DC | PRN
  Administered 2022-12-01: 25 mg via ORAL
  Filled 2022-12-01: qty 10

## 2022-12-01 MED ORDER — SODIUM CHLORIDE 0.9 % IV SOLN
25.0000 mg | Freq: Once | INTRAVENOUS | Status: AC | PRN
  Administered 2022-12-01: 25 mg via INTRAVENOUS
  Filled 2022-12-01: qty 25

## 2022-12-01 NOTE — Progress Notes (Signed)
Subjective: William Jennings is a 32 year old male with a medical history significant for sickle cell disease, chronic pain syndrome, and anemia of chronic disease was admitted for sickle cell pain crisis. Patient states that pain intensity has improved slightly overnight.  He rates pain as 7/10.  He denies any headache, chest pain, shortness of breath, urinary symptoms, nausea, vomiting, or diarrhea.  Objective:  Vital signs in last 24 hours:  Vitals:   12/02/22 0757 12/02/22 0951 12/02/22 1155 12/02/22 1335  BP:  121/85  (!) 120/94  Pulse:  89    Resp: 17 18  20   Temp:  98.2 F (36.8 C)  98.7 F (37.1 C)  TempSrc:      SpO2:  97% 99% 100%  Weight:      Height:        Intake/Output from previous day:   Intake/Output Summary (Last 24 hours) at 12/02/2022 1509 Last data filed at 12/02/2022 0951 Gross per 24 hour  Intake 2079.17 ml  Output 1375 ml  Net 704.17 ml    Physical Exam: General: Alert, awake, oriented x3, in no acute distress.  HEENT: [place]/AT PEERL, EOMI Neck: Trachea midline,  no masses, no thyromegal,y no JVD, no carotid bruit OROPHARYNX:  Moist, No exudate/ erythema/lesions.  Heart: Regular rate and rhythm, without murmurs, rubs, gallops, PMI non-displaced, no heaves or thrills on palpation.  Lungs: Clear to auscultation, no wheezing or rhonchi noted. No increased vocal fremitus resonant to percussion  Abdomen: Soft, nontender, nondistended, positive bowel sounds, no masses no hepatosplenomegaly noted..  Neuro: No focal neurological deficits noted cranial nerves II through XII grossly intact. DTRs 2+ bilaterally upper and lower extremities. Strength 5 out of 5 in bilateral upper and lower extremities. Musculoskeletal: No warm swelling or erythema around joints, no spinal tenderness noted. Psychiatric: Patient alert and oriented x3, good insight and cognition, good recent to remote recall. Lymph node survey: No cervical axillary or inguinal lymphadenopathy noted.  Lab  Results:  Basic Metabolic Panel:    Component Value Date/Time   NA 132 (L) 12/02/2022 0549   NA 139 02/24/2022 1435   K 3.6 12/02/2022 0549   CL 104 12/02/2022 0549   CO2 23 12/02/2022 0549   BUN 9 12/02/2022 0549   BUN 6 02/24/2022 1435   CREATININE 0.91 12/02/2022 0549   GLUCOSE 110 (H) 12/02/2022 0549   CALCIUM 8.5 (L) 12/02/2022 0549   CBC:    Component Value Date/Time   WBC 14.9 (H) 12/02/2022 0549   HGB 10.4 (L) 12/02/2022 0549   HGB 9.6 (L) 02/24/2022 1435   HCT 28.8 (L) 12/02/2022 0549   HCT 28.7 (L) 02/24/2022 1435   PLT 137 (L) 12/02/2022 0549   PLT 299 02/24/2022 1435   MCV 78.0 (L) 12/02/2022 0549   MCV 84 02/24/2022 1435   NEUTROABS 10.1 (H) 11/30/2022 0905   NEUTROABS 3.1 02/24/2022 1435   LYMPHSABS 3.9 11/30/2022 0905   LYMPHSABS 2.8 02/24/2022 1435   MONOABS 1.5 (H) 11/30/2022 0905   EOSABS 0.1 11/30/2022 0905   EOSABS 0.0 02/24/2022 1435   BASOSABS 0.1 11/30/2022 0905   BASOSABS 0.0 02/24/2022 1435    No results found for this or any previous visit (from the past 240 hour(s)).  Studies/Results: CT HEAD WO CONTRAST ( )  Result Date: 12/01/2022 CLINICAL DATA:  Headache with neuro deficit.  Numbness. EXAM: CT HEAD WITHOUT CONTRAST TECHNIQUE: Contiguous axial images were obtained from the base of the skull through the vertex without intravenous contrast. RADIATION DOSE REDUCTION: This exam  was performed according to the departmental dose-optimization program which includes automated exposure control, adjustment of the mA and/or kV according to patient size and/or use of iterative reconstruction technique. COMPARISON:  Head CT 08/14/2012.  MRI head 08/14/2012. FINDINGS: Brain: No evidence of acute infarction, hemorrhage, hydrocephalus, extra-axial collection or mass lesion/mass effect. Vascular: No hyperdense vessel or unexpected calcification. Skull: Normal. Negative for fracture or focal lesion. Sinuses/Orbits: No acute finding. Old right medial orbital  wall fracture. Other: None. IMPRESSION: No acute intracranial abnormality. Electronically Signed   By: Darliss Cheney M.D.   On: 12/01/2022 15:21    Medications: Scheduled Meds:  enoxaparin (LOVENOX) injection  40 mg Subcutaneous Q24H   folic acid  1 mg Oral Daily   HYDROmorphone   Intravenous Q4H   ketorolac  15 mg Intravenous Q6H   senna-docusate  1 tablet Oral BID   Continuous Infusions:  sodium chloride 10 mL/hr at 12/02/22 0512   PRN Meds:.acetaminophen, butalbital-acetaminophen-caffeine, diphenhydrAMINE, naloxone **AND** sodium chloride flush, ondansetron (ZOFRAN) IV, oxyCODONE, polyethylene glycol  Consultants: none  Procedures: none  Antibiotics: none  Assessment/Plan: Principal Problem:   Sickle cell pain crisis (HCC) Active Problems:   Leukocytosis   Chronic pain syndrome   Anemia of chronic disease  Sickle cell disease with pain crisis: Continue IV Dilaudid PCA without any changes Continue oxycodone 10 mg every 6 hours as needed for severe breakthrough pain Toradol 15 mg IV every 6 hours Tylenol 650 mg every 6 hours as needed Continue IV fluids at [facility] Monitor vital signs very closely, reevaluate pain scale regularly, and supplemental oxygen as needed.  Chronic pain syndrome: Restart home medications  Anemia of chronic disease: Hemoglobin is stable and consistent with patient's baseline.  There is no clinical indication for blood transfusion at this time.  Continue to monitor closely.  Leukocytosis: Stable.  No signs of acute infection.  Continue to monitor closely.  Labs in AM. Code Status: Full Code Family Communication: N/A Disposition Plan: Not yet ready for discharge  [name]  APRN, MSN, FNP-C Patient Care [facility] [address] [place], [place] [address] [phone]  If 7PM-7AM, please contact night-coverage.  12/02/2022, 3:09 PM  LOS: 1 day

## 2022-12-02 LAB — BASIC METABOLIC PANEL
Anion gap: 5 (ref 5–15)
BUN: 9 mg/dL (ref 6–20)
CO2: 23 mmol/L (ref 22–32)
Calcium: 8.5 mg/dL — ABNORMAL LOW (ref 8.9–10.3)
Chloride: 104 mmol/L (ref 98–111)
Creatinine, Ser: 0.91 mg/dL (ref 0.61–1.24)
GFR, Estimated: >60 mL/min (ref >60)
Glucose, Bld: 110 mg/dL — ABNORMAL HIGH (ref 70–99)
Potassium: 3.6 mmol/L (ref 3.5–5.1)
Sodium: 132 mmol/L — ABNORMAL LOW (ref 135–145)

## 2022-12-02 LAB — CBC
HCT: 28.8 % — ABNORMAL LOW (ref 39.0–52.0)
Hemoglobin: 10.4 g/dL — ABNORMAL LOW (ref 13.0–17.0)
MCH: 28.2 pg (ref 26.0–34.0)
MCHC: 36.1 g/dL — ABNORMAL HIGH (ref 30.0–36.0)
MCV: 78 fL — ABNORMAL LOW (ref 80.0–100.0)
Platelets: 137 10*3/uL — ABNORMAL LOW (ref 150–400)
RBC: 3.69 MIL/uL — ABNORMAL LOW (ref 4.22–5.81)
RDW: 15.2 % (ref 11.5–15.5)
WBC: 14.9 10*3/uL — ABNORMAL HIGH (ref 4.0–10.5)
nRBC: 9.4 % — ABNORMAL HIGH (ref 0.0–0.2)

## 2022-12-02 NOTE — Progress Notes (Signed)
Subjective: William Jennings is a 32 year old male with a medical history significant for sickle cell disease, chronic pain syndrome, and anemia of chronic disease was admitted for sickle cell pain crisis. Patient continues to have severe pain primarily to his low back and lower extremities.  Patient rates pain at 9/10 despite IV Dilaudid PCA. He denies any headache, chest pain, shortness of breath, urinary symptoms, nausea, vomiting, or diarrhea.  Objective:  Vital signs in last 24 hours:  Vitals:   12/02/22 0504 12/02/22 0553 12/02/22 0757 12/02/22 0951  BP:  122/82  121/85  Pulse:  (!) 104  89  Resp: 18 16 17 18   Temp:  99.6 F (37.6 C)  98.2 F (36.8 C)  TempSrc:  Oral    SpO2: 95% 97%  97%  Weight:      Height:        Intake/Output from previous day:   Intake/Output Summary (Last 24 hours) at 12/02/2022 1102 Last data filed at 12/02/2022 0951 Gross per 24 hour  Intake 2079.17 ml  Output 2125 ml  Net -45.83 ml    Physical Exam: General: Alert, awake, oriented x3, in no acute distress.  HEENT: [place]/AT [place], EOMI Neck: Trachea midline,  no masses, no thyromegal,y no JVD, no carotid bruit OROPHARYNX:  Moist, No exudate/ erythema/lesions.  Heart: Regular rate and rhythm, without murmurs, rubs, gallops, PMI non-displaced, no heaves or thrills on palpation.  Lungs: Clear to auscultation, no wheezing or rhonchi noted. No increased vocal fremitus resonant to percussion  Abdomen: Soft, nontender, nondistended, positive bowel sounds, no masses no hepatosplenomegaly noted..  Neuro: No focal neurological deficits noted cranial nerves II through XII grossly intact. DTRs 2+ bilaterally upper and lower extremities. Strength 5 out of 5 in bilateral upper and lower extremities. Musculoskeletal: No warm swelling or erythema around joints, no spinal tenderness noted. Psychiatric: Patient alert and oriented x3, good insight and cognition, good recent to remote recall. Lymph node survey: No  cervical axillary or inguinal lymphadenopathy noted.  Lab Results:  Basic Metabolic Panel:    Component Value Date/Time   NA 132 (L) 12/02/2022 0549   NA 139 02/24/2022 1435   K 3.6 12/02/2022 0549   CL 104 12/02/2022 0549   CO2 23 12/02/2022 0549   BUN 9 12/02/2022 0549   BUN 6 02/24/2022 1435   CREATININE 0.91 12/02/2022 0549   GLUCOSE 110 (H) 12/02/2022 0549   CALCIUM 8.5 (L) 12/02/2022 0549   CBC:    Component Value Date/Time   WBC 14.9 (H) 12/02/2022 0549   HGB 10.4 (L) 12/02/2022 0549   HGB 9.6 (L) 02/24/2022 1435   HCT 28.8 (L) 12/02/2022 0549   HCT 28.7 (L) 02/24/2022 1435   PLT 137 (L) 12/02/2022 0549   PLT 299 02/24/2022 1435   MCV 78.0 (L) 12/02/2022 0549   MCV 84 02/24/2022 1435   NEUTROABS 10.1 (H) 11/30/2022 0905   NEUTROABS 3.1 02/24/2022 1435   LYMPHSABS 3.9 11/30/2022 0905   LYMPHSABS 2.8 02/24/2022 1435   MONOABS 1.5 (H) 11/30/2022 0905   EOSABS 0.1 11/30/2022 0905   EOSABS 0.0 02/24/2022 1435   BASOSABS 0.1 11/30/2022 0905   BASOSABS 0.0 02/24/2022 1435    No results found for this or any previous visit (from the past 240 hour(s)).  Studies/Results: CT HEAD WO CONTRAST ( )  Result Date: 12/01/2022 CLINICAL DATA:  Headache with neuro deficit.  Numbness. EXAM: CT HEAD WITHOUT CONTRAST TECHNIQUE: Contiguous axial images were obtained from the base of the skull through the vertex  without intravenous contrast. RADIATION DOSE REDUCTION: This exam was performed according to the departmental dose-optimization program which includes automated exposure control, adjustment of the mA and/or kV according to patient size and/or use of iterative reconstruction technique. COMPARISON:  Head CT 08/14/2012.  MRI head 08/14/2012. FINDINGS: Brain: No evidence of acute infarction, hemorrhage, hydrocephalus, extra-axial collection or mass lesion/mass effect. Vascular: No hyperdense vessel or unexpected calcification. Skull: Normal. Negative for fracture or focal lesion.  Sinuses/Orbits: No acute finding. Old right medial orbital wall fracture. Other: None. IMPRESSION: No acute intracranial abnormality. Electronically Signed   By: Darliss Cheney M.D.   On: 12/01/2022 15:21    Medications: Scheduled Meds:  enoxaparin (LOVENOX) injection  40 mg Subcutaneous Q24H   folic acid  1 mg Oral Daily   HYDROmorphone   Intravenous Q4H   ketorolac  15 mg Intravenous Q6H   senna-docusate  1 tablet Oral BID   Continuous Infusions:  sodium chloride 10 mL/hr at 12/02/22 0512   PRN Meds:.acetaminophen, butalbital-acetaminophen-caffeine, diphenhydrAMINE, naloxone **AND** sodium chloride flush, ondansetron (ZOFRAN) IV, oxyCODONE, polyethylene glycol  Consultants: none  Procedures: none  Antibiotics: none  Assessment/Plan: Principal Problem:   Sickle cell pain crisis (HCC) Active Problems:   Leukocytosis   Chronic pain syndrome   Anemia of chronic disease  Sickle cell disease with pain crisis: Continue IV Dilaudid PCA without any changes Restart oxycodone 10 mg every 6 hours as needed for severe breakthrough pain Toradol 15 mg IV every 6 hours Tylenol 650 mg every 6 hours as needed Continue IV fluids at [facility] Monitor vital signs very closely, reevaluate pain scale regularly, and supplemental oxygen as needed.  Chronic pain syndrome: Restart home medications  Anemia of chronic disease: Hemoglobin is stable and consistent with patient's baseline.  There is no clinical indication for blood transfusion at this time.  Continue to monitor closely.  Leukocytosis: Stable.  No signs of acute infection.  Continue to monitor closely.  Labs in AM. Code Status: Full Code Family Communication: N/A Disposition Plan: Not yet ready for discharge  [name]  APRN, MSN, FNP-C [facility] [address] [place], [place] [address] [phone]  If 7PM-7AM, please contact night-coverage.  12/02/2022, 11:02 AM  LOS: 1 day

## 2022-12-02 NOTE — Progress Notes (Signed)
   12/02/22 2049  TOC Brief Assessment  Insurance and Status Lapsed  Patient has primary care physician Yes  Home environment has been reviewed yes from home  Prior level of function: Independent  Prior/Current Home Services No current home services  Social Determinants of Health Reivew SDOH reviewed no interventions necessary  Readmission risk has been reviewed Yes  Transition of care needs no transition of care needs at this time   Brief assessment completed. No TOC needs

## 2022-12-03 NOTE — Progress Notes (Signed)
Subjective: William Jennings is a 32 year old male with a medical history significant for sickle cell disease, chronic pain syndrome, and anemia of chronic disease was admitted for sickle cell pain crisis. Patient states that pain intensity has improved some overnight.  He rates pain as 7/10 primarily to his low back. Patient is also complaining of right-sided dental pain.  He feels that his "wisdom teeth are bothering him".  Patient also reported some numbness to his lip and chin.  He underwent a CT of his head 2 days prior, which was unremarkable.  He denies any headache, chest pain, shortness of breath, urinary symptoms, nausea, vomiting, or diarrhea.  Objective:  Vital signs in last 24 hours:  Vitals:   12/03/22 0616 12/03/22 0707 12/03/22 0721 12/03/22 0950  BP: 105/67   114/76  Pulse: 100   88  Resp: 16 11    Temp: 99.7 F (37.6 C)  98.6 F (37 C) 98.5 F (36.9 C)  TempSrc:    Oral  SpO2: 96%   94%  Weight:      Height:        Intake/Output from previous day:   Intake/Output Summary (Last 24 hours) at 12/03/2022 1128 Last data filed at 12/03/2022 0235 Gross per 24 hour  Intake 240 ml  Output 700 ml  Net -460 ml    Physical Exam: General: Alert, awake, oriented x3, in no acute distress.  HEENT: [place]/AT [place], EOMI Neck: Trachea midline,  no masses, no thyromegal,y no JVD, no carotid bruit OROPHARYNX:  Moist, No exudate/ erythema/lesions.  Heart: Regular rate and rhythm, without murmurs, rubs, gallops, PMI non-displaced, no heaves or thrills on palpation.  Lungs: Clear to auscultation, no wheezing or rhonchi noted. No increased vocal fremitus resonant to percussion  Abdomen: Soft, nontender, nondistended, positive bowel sounds, no masses no hepatosplenomegaly noted..  Neuro: No focal neurological deficits noted cranial nerves II through XII grossly intact. DTRs 2+ bilaterally upper and lower extremities. Strength 5 out of 5 in bilateral upper and lower  extremities. Musculoskeletal: No warm swelling or erythema around joints, no spinal tenderness noted. Psychiatric: Patient alert and oriented x3, good insight and cognition, good recent to remote recall. Lymph node survey: No cervical axillary or inguinal lymphadenopathy noted.  Lab Results:  Basic Metabolic Panel:    Component Value Date/Time   NA 132 (L) 12/02/2022 0549   NA 139 02/24/2022 1435   K 3.6 12/02/2022 0549   CL 104 12/02/2022 0549   CO2 23 12/02/2022 0549   BUN 9 12/02/2022 0549   BUN 6 02/24/2022 1435   CREATININE 0.91 12/02/2022 0549   GLUCOSE 110 (H) 12/02/2022 0549   CALCIUM 8.5 (L) 12/02/2022 0549   CBC:    Component Value Date/Time   WBC 14.9 (H) 12/02/2022 0549   HGB 10.4 (L) 12/02/2022 0549   HGB 9.6 (L) 02/24/2022 1435   HCT 28.8 (L) 12/02/2022 0549   HCT 28.7 (L) 02/24/2022 1435   PLT 137 (L) 12/02/2022 0549   PLT 299 02/24/2022 1435   MCV 78.0 (L) 12/02/2022 0549   MCV 84 02/24/2022 1435   NEUTROABS 10.1 (H) 11/30/2022 0905   NEUTROABS 3.1 02/24/2022 1435   LYMPHSABS 3.9 11/30/2022 0905   LYMPHSABS 2.8 02/24/2022 1435   MONOABS 1.5 (H) 11/30/2022 0905   EOSABS 0.1 11/30/2022 0905   EOSABS 0.0 02/24/2022 1435   BASOSABS 0.1 11/30/2022 0905   BASOSABS 0.0 02/24/2022 1435    No results found for this or any previous visit (from the past  240 hour(s)).  Studies/Results: CT HEAD WO CONTRAST ( )  Result Date: 12/01/2022 CLINICAL DATA:  Headache with neuro deficit.  Numbness. EXAM: CT HEAD WITHOUT CONTRAST TECHNIQUE: Contiguous axial images were obtained from the base of the skull through the vertex without intravenous contrast. RADIATION DOSE REDUCTION: This exam was performed according to the departmental dose-optimization program which includes automated exposure control, adjustment of the mA and/or kV according to patient size and/or use of iterative reconstruction technique. COMPARISON:  Head CT 08/14/2012.  MRI head 08/14/2012. FINDINGS:  Brain: No evidence of acute infarction, hemorrhage, hydrocephalus, extra-axial collection or mass lesion/mass effect. Vascular: No hyperdense vessel or unexpected calcification. Skull: Normal. Negative for fracture or focal lesion. Sinuses/Orbits: No acute finding. Old right medial orbital wall fracture. Other: None. IMPRESSION: No acute intracranial abnormality. Electronically Signed   By: Darliss Cheney M.D.   On: 12/01/2022 15:21    Medications: Scheduled Meds:  enoxaparin (LOVENOX) injection  40 mg Subcutaneous Q24H   folic acid  1 mg Oral Daily   HYDROmorphone   Intravenous Q4H   ketorolac  15 mg Intravenous Q6H   senna-docusate  1 tablet Oral BID   Continuous Infusions:  sodium chloride 10 mL/hr at 12/02/22 0512   PRN Meds:.acetaminophen, butalbital-acetaminophen-caffeine, diphenhydrAMINE, naloxone **AND** sodium chloride flush, ondansetron (ZOFRAN) IV, oxyCODONE, polyethylene glycol  Consultants: none  Procedures: none  Antibiotics: none  Assessment/Plan: Principal Problem:   Sickle cell pain crisis (HCC) Active Problems:   Leukocytosis   Chronic pain syndrome   Anemia of chronic disease  Sickle cell disease with pain crisis: Continue IV Dilaudid PCA without any changes Continue oxycodone 10 mg every 6 hours as needed for severe breakthrough pain Toradol 15 mg IV every 6 hours Tylenol 650 mg every 6 hours as needed Continue IV fluids at [facility] Monitor vital signs very closely, reevaluate pain scale regularly, and supplemental oxygen as needed.  Chronic pain syndrome: Restart home medications  Anemia of chronic disease: Hemoglobin is stable and consistent with patient's baseline.  There is no clinical indication for blood transfusion at this time.  Continue to monitor closely.  Leukocytosis: Stable.  No signs of acute infection.  Continue to monitor closely.  Labs in AM. Code Status: Full Code Family Communication: N/A Disposition Plan: Not yet ready for discharge.   Discharge planned for 12/04/2022  [name]  APRN, MSN, FNP-C Patient [facility] [address] [place], [place] [address] [phone]  If 7PM-7AM, please contact night-coverage.  12/03/2022, 11:28 AM  LOS: 2 days

## 2022-12-04 DIAGNOSIS — D57219 Sickle-cell/Hb-C disease with crisis, unspecified: Secondary | ICD-10-CM

## 2022-12-04 LAB — CBC
HCT: 24.3 % — ABNORMAL LOW (ref 39.0–52.0)
Hemoglobin: 8.8 g/dL — ABNORMAL LOW (ref 13.0–17.0)
MCH: 28.1 pg (ref 26.0–34.0)
MCHC: 36.2 g/dL — ABNORMAL HIGH (ref 30.0–36.0)
MCV: 77.6 fL — ABNORMAL LOW (ref 80.0–100.0)
Platelets: 111 10*3/uL — ABNORMAL LOW (ref 150–400)
RBC: 3.13 MIL/uL — ABNORMAL LOW (ref 4.22–5.81)
RDW: 14.9 % (ref 11.5–15.5)
WBC: 11 10*3/uL — ABNORMAL HIGH (ref 4.0–10.5)
nRBC: 4.6 % — ABNORMAL HIGH (ref 0.0–0.2)

## 2022-12-04 NOTE — Progress Notes (Signed)
Patient ID: FERRIS FIELDEN, male   DOB: 1991/02/21, 32 y.o.   MRN: [id number] Subjective: William Jennings is a 32 year old male with a medical history significant for sickle cell disease, chronic pain syndrome, and anemia of chronic disease was admitted for sickle cell pain crisis.   Patient claims he is still in significant pain especially in his lower back and lower extremities.  He continues to endorse dental ache and some numbness around his chin area.  He denies any headache, visual blurriness, chest pain, shortness of breath, nausea, vomiting or diarrhea.  No urinary symptoms.  Objective:  Vital signs in last 24 hours:  Vitals:   12/04/22 0412 12/04/22 0542 12/04/22 0741 12/04/22 1029  BP:  (!) 115/92  117/79  Pulse:  (!) 107  92  Resp: 14 18 13 10   Temp:  99.2 F (37.3 C)  98.4 F (36.9 C)  TempSrc:  Oral  Oral  SpO2: 98% 100% 98% 100%  Weight:      Height:        Intake/Output from previous day:   Intake/Output Summary (Last 24 hours) at 12/04/2022 1324 Last data filed at 12/04/2022 0004 Gross per 24 hour  Intake --  Output 500 ml  Net -500 ml    Physical Exam: General: Alert, awake, oriented x3, in no acute distress.  Dental caries around the molar on the right.  No significant redness or swelling. HEENT: [place]/AT [place]ER[place], EOMI Neck: Trachea midline,  no masses, no thyromegal,y no JVD, no carotid bruit OROPHARYNX:  Moist, No exudate/ erythema/lesions.  Heart: Regular rate and rhythm, without murmurs, rubs, gallops, PMI non-displaced, no heaves or thrills on palpation.  Lungs: Clear to auscultation, no wheezing or rhonchi noted. No increased vocal fremitus resonant to percussion  Abdomen: Soft, nontender, nondistended, positive bowel sounds, no masses no hepatosplenomegaly noted..  Neuro: No focal neurological deficits noted cranial nerves II through XII grossly intact. DTRs 2+ bilaterally upper and lower extremities. Strength 5 out of 5 in bilateral upper and lower  extremities. Musculoskeletal: No warm swelling or erythema around joints, no spinal tenderness noted. Psychiatric: Patient alert and oriented x3, good insight and cognition, good recent to remote recall. Lymph node survey: No cervical axillary or inguinal lymphadenopathy noted.  Lab Results:  Basic Metabolic Panel:    Component Value Date/Time   NA 132 (L) 12/02/2022 0549   NA 139 02/24/2022 1435   K 3.6 12/02/2022 0549   CL 104 12/02/2022 0549   CO2 23 12/02/2022 0549   BUN 9 12/02/2022 0549   BUN 6 02/24/2022 1435   CREATININE 0.91 12/02/2022 0549   GLUCOSE 110 (H) 12/02/2022 0549   CALCIUM 8.5 (L) 12/02/2022 0549   CBC:    Component Value Date/Time   WBC 11.0 (H) 12/04/2022 0734   HGB 8.8 (L) 12/04/2022 0734   HGB 9.6 (L) 02/24/2022 1435   HCT 24.3 (L) 12/04/2022 0734   HCT 28.7 (L) 02/24/2022 1435   PLT 111 (L) 12/04/2022 0734   PLT 299 02/24/2022 1435   MCV 77.6 (L) 12/04/2022 0734   MCV 84 02/24/2022 1435   NEUTROABS 10.1 (H) 11/30/2022 0905   NEUTROABS 3.1 02/24/2022 1435   LYMPHSABS 3.9 11/30/2022 0905   LYMPHSABS 2.8 02/24/2022 1435   MONOABS 1.5 (H) 11/30/2022 0905   EOSABS 0.1 11/30/2022 0905   EOSABS 0.0 02/24/2022 1435   BASOSABS 0.1 11/30/2022 0905   BASOSABS 0.0 02/24/2022 1435    No results found for this or any previous visit (from the  past 240 hour(s)).  Studies/Results: No results found.  Medications: Scheduled Meds:  enoxaparin (LOVENOX) injection  40 mg Subcutaneous Q24H   folic acid  1 mg Oral Daily   HYDROmorphone   Intravenous Q4H   ketorolac  15 mg Intravenous Q6H   senna-docusate  1 tablet Oral BID   Continuous Infusions:  sodium chloride 10 mL/hr at 12/02/22 0512   PRN Meds:.acetaminophen, butalbital-acetaminophen-caffeine, diphenhydrAMINE, naloxone **AND** sodium chloride flush, ondansetron (ZOFRAN) IV, oxyCODONE, polyethylene glycol  Consultants: None  Procedures: None  Antibiotics: None  Assessment/Plan: Principal  Problem:   Sickle cell pain crisis (HCC) Active Problems:   Leukocytosis   Chronic pain syndrome   Anemia of chronic disease  Hb Sickle Cell Disease with Pain crisis: Continue IVF at [facility], continue weight based Dilaudid PCA at current dose setting, continue IV Toradol 15 mg Q 6 H for total of 5 days, continue oral pain medications as ordered.  Monitor vitals very closely, Re-evaluate pain scale regularly, 2 L of Oxygen by [place].  Patient encouraged to ambulate today. Leukocytosis: Stable.  No evidence of infections or inflammations.  Will continue to monitor closely without antibiotics. Anemia of Chronic Disease: Hemoglobin remained stable although slightly decline but not at threshold for blood transfusion today.  Will repeat labs in a.m., monitor closely and transfuse as appropriate. Chronic pain Syndrome: Continue home medications as ordered.  Code Status: Full Code Family Communication: N/A Disposition Plan: For possible discharge tomorrow 12/05/2022.  Japhet Morgenthaler  If 7PM-7AM, please contact night-coverage.  12/04/2022, 1:24 PM  LOS: 3 days

## 2022-12-05 LAB — CBC
HCT: 22 % — ABNORMAL LOW (ref 39.0–52.0)
Hemoglobin: 8 g/dL — ABNORMAL LOW (ref 13.0–17.0)
MCH: 28.4 pg (ref 26.0–34.0)
MCHC: 36.4 g/dL — ABNORMAL HIGH (ref 30.0–36.0)
MCV: 78 fL — ABNORMAL LOW (ref 80.0–100.0)
Platelets: 107 10*3/uL — ABNORMAL LOW (ref 150–400)
RBC: 2.82 MIL/uL — ABNORMAL LOW (ref 4.22–5.81)
RDW: 14.8 % (ref 11.5–15.5)
WBC: 9.6 10*3/uL (ref 4.0–10.5)
nRBC: 4.6 % — ABNORMAL HIGH (ref 0.0–0.2)

## 2022-12-05 LAB — BASIC METABOLIC PANEL
Anion gap: 7 (ref 5–15)
BUN: 6 mg/dL (ref 6–20)
CO2: 28 mmol/L (ref 22–32)
Calcium: 8.4 mg/dL — ABNORMAL LOW (ref 8.9–10.3)
Chloride: 97 mmol/L — ABNORMAL LOW (ref 98–111)
Creatinine, Ser: 0.86 mg/dL (ref 0.61–1.24)
GFR, Estimated: >60 mL/min (ref >60)
Glucose, Bld: 126 mg/dL — ABNORMAL HIGH (ref 70–99)
Potassium: 3 mmol/L — ABNORMAL LOW (ref 3.5–5.1)
Sodium: 132 mmol/L — ABNORMAL LOW (ref 135–145)

## 2022-12-05 MED ORDER — IBUPROFEN 200 MG PO TABS
600.0000 mg | ORAL_TABLET | Freq: Four times a day (QID) | ORAL | Status: DC | PRN
  Administered 2022-12-06: 600 mg via ORAL
  Filled 2022-12-05: qty 3

## 2022-12-05 MED ORDER — POTASSIUM CHLORIDE CRYS ER 20 MEQ PO TBCR
40.0000 meq | EXTENDED_RELEASE_TABLET | Freq: Once | ORAL | Status: AC
  Administered 2022-12-05: 40 meq via ORAL
  Filled 2022-12-05: qty 2

## 2022-12-05 MED ORDER — SODIUM CHLORIDE 0.9 % IV SOLN
3.0000 g | Freq: Four times a day (QID) | INTRAVENOUS | Status: DC
  Administered 2022-12-05 – 2022-12-07 (×9): 3 g via INTRAVENOUS
  Filled 2022-12-05 (×10): qty 8

## 2022-12-05 NOTE — Progress Notes (Signed)
Patient ID: KAELEM BRACH, male   DOB: 11/19/1990, 32 y.o.   MRN: [id number] Subjective: William Jennings is a 32 year old male with a medical history significant for sickle cell disease, chronic pain syndrome, and anemia of chronic disease was admitted for sickle cell pain crisis.   Patient continues to endorse significant pain in his mouth especially on the right side around the TMJ, and numbness of his gum and the chin.  For this reason he has not been able to eat because of pain.  He also felt like his gum is swollen especially on the right side.  He spiked low-grade fever twice yesterday and overnight.  Although he feels his sickle cell pain is slightly improved today, he is able to ambulate on the hallway.  He denies any headache, blurry vision, cough, chest pain, shortness of breath, nausea, vomiting or diarrhea.  No urinary symptoms.  Objective:  Vital signs in last 24 hours:  Vitals:   12/05/22 0011 12/05/22 0439 12/05/22 0506 12/05/22 0959  BP: 113/71 108/71  122/80  Pulse: (!) 104 95  94  Resp: 16 14 14 20   Temp: 98.7 F (37.1 C) (!) 100.4 F (38 C)  98.8 F (37.1 C)  TempSrc: Oral Oral  Oral  SpO2: 99% 98% 98% 100%  Weight:      Height:        Intake/Output from previous day:   Intake/Output Summary (Last 24 hours) at 12/05/2022 1315 Last data filed at 12/05/2022 1001 Gross per 24 hour  Intake 240 ml  Output 500 ml  Net -260 ml     Physical Exam: General: Alert, awake, oriented x3, in no acute distress.  Dental caries around the molar on the right.  Some redness and slight swelling. HEENT: [place]/AT PEERL, EOMI Neck: Trachea midline,  no masses, no thyromegal,y no JVD, no carotid bruit OROPHARYNX:  Moist, No exudate/ erythema/lesions.  Heart: Regular rate and rhythm, without murmurs, rubs, gallops, PMI non-displaced, no heaves or thrills on palpation.  Lungs: Clear to auscultation, no wheezing or rhonchi noted. No increased vocal fremitus resonant to percussion   Abdomen: Soft, nontender, nondistended, positive bowel sounds, no masses no hepatosplenomegaly noted..  Neuro: No focal neurological deficits noted cranial nerves II through XII grossly intact. DTRs 2+ bilaterally upper and lower extremities. Strength 5 out of 5 in bilateral upper and lower extremities. Musculoskeletal: No warm swelling or erythema around joints, no spinal tenderness noted. Psychiatric: Patient alert and oriented x3, good insight and cognition, good recent to remote recall. Lymph node survey: No cervical axillary or inguinal lymphadenopathy noted.  Lab Results:  Basic Metabolic Panel:    Component Value Date/Time   NA 132 (L) 12/05/2022 0652   NA 139 02/24/2022 1435   K 3.0 (L) 12/05/2022 0652   CL 97 (L) 12/05/2022 0652   CO2 28 12/05/2022 0652   BUN 6 12/05/2022 0652   BUN 6 02/24/2022 1435   CREATININE 0.86 12/05/2022 0652   GLUCOSE 126 (H) 12/05/2022 0652   CALCIUM 8.4 (L) 12/05/2022 0652   CBC:    Component Value Date/Time   WBC 9.6 12/05/2022 0652   HGB 8.0 (L) 12/05/2022 0652   HGB 9.6 (L) 02/24/2022 1435   HCT 22.0 (L) 12/05/2022 0652   HCT 28.7 (L) 02/24/2022 1435   PLT 107 (L) 12/05/2022 0652   PLT 299 02/24/2022 1435   MCV 78.0 (L) 12/05/2022 0652   MCV 84 02/24/2022 1435   NEUTROABS 10.1 (H) 11/30/2022 0905   NEUTROABS 3.1  02/24/2022 1435   LYMPHSABS 3.9 11/30/2022 0905   LYMPHSABS 2.8 02/24/2022 1435   MONOABS 1.5 (H) 11/30/2022 0905   EOSABS 0.1 11/30/2022 0905   EOSABS 0.0 02/24/2022 1435   BASOSABS 0.1 11/30/2022 0905   BASOSABS 0.0 02/24/2022 1435    No results found for this or any previous visit (from the past 240 hour(s)).  Studies/Results: No results found.  Medications: Scheduled Meds:  enoxaparin (LOVENOX) injection  40 mg Subcutaneous Q24H   folic acid  1 mg Oral Daily   HYDROmorphone   Intravenous Q4H   ketorolac  15 mg Intravenous Q6H   potassium chloride SA  40 mEq Oral Once   senna-docusate  1 tablet Oral BID    Continuous Infusions:  sodium chloride 10 mL/hr at 12/02/22 0512   ampicillin-sulbactam (UNASYN) IV     PRN Meds:.acetaminophen, butalbital-acetaminophen-caffeine, diphenhydrAMINE, naloxone **AND** sodium chloride flush, ondansetron (ZOFRAN) IV, oxyCODONE, polyethylene glycol  Consultants: None  Procedures: None  Antibiotics: IV Unasyn  Assessment/Plan: Principal Problem:   Sickle cell pain crisis (HCC) Active Problems:   Leukocytosis   Chronic pain syndrome   Anemia of chronic disease  Dental caries and possible abscess: Start IV Unasyn in view of slight swelling and redness close low-grade fever.  Patient will need dental work after discharge from the hospital.  Continue pain management. Hb Sickle Cell Disease with Pain crisis: Continue IVF at [facility], continue weight based Dilaudid PCA at current dose setting, continue IV Toradol 15 mg Q 6 H for total of 5 days, continue oral pain medications as ordered.  Monitor vitals very closely, Re-evaluate pain scale regularly, 2 L of Oxygen by [place].  Patient encouraged to ambulate today. Leukocytosis: Stable.  Patient has been started on IV antibiotics as noted above. Anemia of Chronic Disease: Hemoglobin remained stable above transfusion threshold. Will repeat labs in a.m., monitor closely and transfuse as appropriate. Chronic pain Syndrome: Continue home medications as ordered.  Code Status: Full Code Family Communication: N/A Disposition Plan: For possible discharge tomorrow 12/06/2022.  Katurah Karapetian  If 7PM-7AM, please contact night-coverage.  12/05/2022, 1:15 PM  LOS: 4 days

## 2022-12-06 LAB — BASIC METABOLIC PANEL
Anion gap: 13 (ref 5–15)
BUN: 5 mg/dL — ABNORMAL LOW (ref 6–20)
CO2: 26 mmol/L (ref 22–32)
Calcium: 9.1 mg/dL (ref 8.9–10.3)
Chloride: 98 mmol/L (ref 98–111)
Creatinine, Ser: 0.76 mg/dL (ref 0.61–1.24)
GFR, Estimated: >60 mL/min (ref >60)
Glucose, Bld: 126 mg/dL — ABNORMAL HIGH (ref 70–99)
Potassium: 3.5 mmol/L (ref 3.5–5.1)
Sodium: 137 mmol/L (ref 135–145)

## 2022-12-06 LAB — CBC
HCT: 23 % — ABNORMAL LOW (ref 39.0–52.0)
Hemoglobin: 8.1 g/dL — ABNORMAL LOW (ref 13.0–17.0)
MCH: 27.6 pg (ref 26.0–34.0)
MCHC: 35.2 g/dL (ref 30.0–36.0)
MCV: 78.2 fL — ABNORMAL LOW (ref 80.0–100.0)
Platelets: 127 10*3/uL — ABNORMAL LOW (ref 150–400)
RBC: 2.94 MIL/uL — ABNORMAL LOW (ref 4.22–5.81)
RDW: 15 % (ref 11.5–15.5)
WBC: 10.2 10*3/uL (ref 4.0–10.5)
nRBC: 1.9 % — ABNORMAL HIGH (ref 0.0–0.2)

## 2022-12-06 MED ORDER — OXYCODONE HCL 5 MG PO TABS
10.0000 mg | ORAL_TABLET | ORAL | Status: DC | PRN
  Administered 2022-12-06 – 2022-12-07 (×4): 10 mg via ORAL
  Filled 2022-12-06 (×4): qty 2

## 2022-12-06 NOTE — Progress Notes (Signed)
Subjective: William Jennings is a 32 year old male with a medical history significant for sickle cell disease, chronic pain syndrome, and anemia of chronic disease was admitted for sickle cell pain crisis. Patient states that pain intensity has improved some overnight.  He rates pain as 7/10 primarily to his low back. Patient is also complaining of right-sided dental pain.  He feels that his "wisdom teeth are bothering him".  Patient also reported some numbness to his lip and chin.  He underwent a CT of his head 2 days prior, which was unremarkable.  He denies any headache, chest pain, shortness of breath, urinary symptoms, nausea, vomiting, or diarrhea.  Objective:  Vital signs in last 24 hours:  Vitals:   12/06/22 0013 12/06/22 0337 12/06/22 0342 12/06/22 0720  BP:  105/61    Pulse:  96    Resp: 20  20 18   Temp:      TempSrc:      SpO2: (!) 1% 99% 99% 98%  Weight:      Height:        Intake/Output from previous day:   Intake/Output Summary (Last 24 hours) at 12/06/2022 1424 Last data filed at 12/06/2022 1138 Gross per 24 hour  Intake 1376.83 ml  Output 900 ml  Net 476.83 ml    Physical Exam: General: Alert, awake, oriented x3, in no acute distress.  HEENT: [place]/AT PEERL, EOMI Neck: Trachea midline,  no masses, no thyromegal,y no JVD, no carotid bruit OROPHARYNX:  Moist, No exudate/ erythema/lesions.  Heart: Regular rate and rhythm, without murmurs, rubs, gallops, PMI non-displaced, no heaves or thrills on palpation.  Lungs: Clear to auscultation, no wheezing or rhonchi noted. No increased vocal fremitus resonant to percussion  Abdomen: Soft, nontender, nondistended, positive bowel sounds, no masses no hepatosplenomegaly noted..  Neuro: No focal neurological deficits noted cranial nerves II through XII grossly intact. DTRs 2+ bilaterally upper and lower extremities. Strength 5 out of 5 in bilateral upper and lower extremities. Musculoskeletal: No warm swelling or erythema around  joints, no spinal tenderness noted. Psychiatric: Patient alert and oriented x3, good insight and cognition, good recent to remote recall. Lymph node survey: No cervical axillary or inguinal lymphadenopathy noted.  Lab Results:  Basic Metabolic Panel:    Component Value Date/Time   NA 137 12/06/2022 0457   NA 139 02/24/2022 1435   K 3.5 12/06/2022 0457   CL 98 12/06/2022 0457   CO2 26 12/06/2022 0457   BUN 5 (L) 12/06/2022 0457   BUN 6 02/24/2022 1435   CREATININE 0.76 12/06/2022 0457   GLUCOSE 126 (H) 12/06/2022 0457   CALCIUM 9.1 12/06/2022 0457   CBC:    Component Value Date/Time   WBC 10.2 12/06/2022 0457   HGB 8.1 (L) 12/06/2022 0457   HGB 9.6 (L) 02/24/2022 1435   HCT 23.0 (L) 12/06/2022 0457   HCT 28.7 (L) 02/24/2022 1435   PLT 127 (L) 12/06/2022 0457   PLT 299 02/24/2022 1435   MCV 78.2 (L) 12/06/2022 0457   MCV 84 02/24/2022 1435   NEUTROABS 10.1 (H) 11/30/2022 0905   NEUTROABS 3.1 02/24/2022 1435   LYMPHSABS 3.9 11/30/2022 0905   LYMPHSABS 2.8 02/24/2022 1435   MONOABS 1.5 (H) 11/30/2022 0905   EOSABS 0.1 11/30/2022 0905   EOSABS 0.0 02/24/2022 1435   BASOSABS 0.1 11/30/2022 0905   BASOSABS 0.0 02/24/2022 1435    No results found for this or any previous visit (from the past 240 hour(s)).  Studies/Results: No results found.  Medications: Scheduled  Meds:  enoxaparin (LOVENOX) injection  40 mg Subcutaneous Q24H   folic acid  1 mg Oral Daily   senna-docusate  1 tablet Oral BID   Continuous Infusions:  sodium chloride 10 mL/hr at 12/06/22 1138   ampicillin-sulbactam (UNASYN) IV 3 g (12/06/22 1303)   PRN Meds:.acetaminophen, butalbital-acetaminophen-caffeine, diphenhydrAMINE, ibuprofen, oxyCODONE, polyethylene glycol  Consultants: none  Procedures: none  Antibiotics: none  Assessment/Plan: Principal Problem:   Sickle cell pain crisis (HCC) Active Problems:   Leukocytosis   Chronic pain syndrome   Anemia of chronic disease  Sickle cell  disease with pain crisis: Continue IV Dilaudid PCA without any changes Continue oxycodone 10 mg every 6 hours as needed for severe breakthrough pain Toradol 15 mg IV every 6 hours Tylenol 650 mg every 6 hours as needed Continue IV fluids at [facility] Monitor vital signs very closely, reevaluate pain scale regularly, and supplemental oxygen as needed.  Chronic pain syndrome: Restart home medications  Anemia of chronic disease: Hemoglobin is stable and consistent with patient's baseline.  There is no clinical indication for blood transfusion at this time.  Continue to monitor closely.  Leukocytosis: Stable.  No signs of acute infection.  Continue to monitor closely.  Labs in AM. Code Status: Full Code Family Communication: N/A Disposition Plan: Not yet ready for discharge.  Discharge planned for 12/04/2022  [name]  APRN, MSN, FNP-C Patient Care [facility] [address] [place], [place] [address] [phone]  If 7PM-7AM, please contact night-coverage.  12/06/2022, 2:24 PM  LOS: 5 days

## 2022-12-06 NOTE — Progress Notes (Signed)
Patient states that he has been having visual and auditory hallucinations. He states that this happens whenever he gets placed on a PCA pump but that it usually happens after he goes home. He states that this has been happening since last night. [place] Hollis, FNP notified during rounding and orders were placed to discontinue PCA and start patient on PRN oxycodone. Patient in agreement with plan and PCA pump has been discontinued.

## 2022-12-07 MED ORDER — AMOXICILLIN 500 MG PO CAPS: 500.0000 mg | ORAL_CAPSULE | Freq: Two times a day (BID) | ORAL | 0 refills | Status: AC

## 2022-12-07 MED ORDER — FOLIC ACID 1 MG PO TABS: 1.0000 mg | ORAL_TABLET | Freq: Every day | ORAL | 1 refills | Status: DC

## 2022-12-07 MED ORDER — IBUPROFEN 800 MG PO TABS: 800.0000 mg | ORAL_TABLET | Freq: Four times a day (QID) | ORAL | 0 refills | Status: DC | PRN

## 2022-12-07 NOTE — Progress Notes (Signed)
Patient complains that he feels like he is detoxing. He states that he was having vivid nightmares and cold sweats all night. He states that he has body aches and headache as well and reports feeling overall weak.

## 2022-12-07 NOTE — Progress Notes (Signed)
William Jennings to be D/C'd per MD order. Discussed with the patient and all questions fully answered. ? VSS, Skin clean, dry and intact without evidence of skin break down, no evidence of skin tears noted. ? IV catheter discontinued intact. Site without signs and symptoms of complications. Dressing and pressure applied. ? An After Visit Summary was printed and given to the patient. Patient informed where to pickup prescriptions. ? D/c education completed with patient/family including follow up instructions, medication list, d/c activities limitations if indicated, with other d/c instructions as indicated by MD - patient able to verbalize understanding, all questions fully answered.  ? Patient instructed to return to ED, call 911, or call MD for any changes in condition.  ? Patient to be escorted via WC, and D/C home via private auto.  Patient belongings all returned to patient including clothing and cell phone.

## 2022-12-07 NOTE — Discharge Summary (Signed)
Physician Discharge Summary  William Jennings ZOX:[id number] DOB: 02-12-1991 DOA: 11/30/2022  PCP: Massie Maroon, FNP  Admit date: 11/30/2022  Discharge date: 12/07/2022  Discharge Diagnoses:  Principal Problem:   Sickle cell pain crisis (HCC) Active Problems:   Leukocytosis   Chronic pain syndrome   Anemia of chronic disease   Discharge Condition: Stable  Disposition:   Follow-up Information     Massie Maroon, FNP Follow up.   Specialty: Family Medicine Contact information: [address] [place] [address] [phone]                Pt is discharged home in good condition and is to follow up with Massie Maroon, FNP this week to have labs evaluated. William Jennings is instructed to increase activity slowly and balance with rest for the next few days, and use prescribed medication to complete treatment of pain  Diet: Regular Wt Readings from Last 3 Encounters:  11/30/22 70.3 kg  11/29/22 70.3 kg  08/08/22 64.9 kg   History of Present Illness: William Jennings is a 32 year old male with a medical history significant for sickle cell disease, chronic pain syndrome, and history of anemia of chronic disease presents with complaint of upper extremity and low back pain for 1 day.  Patient was treated and evaluated in the emergency department on 11/29/2022 for this problem.  He states that pain was somewhat controlled at ER discharge, but returned shortly after returning home.  He has not identified any inciting factors.  He states that he awakened on yesterday with intense pain primarily to low back and lower extremities.  He has been taking his home oxycodone and ibuprofen without very much relief.  He last had oxycodone this a.m.  He denies any fever, chills, chest pain, shortness of breath, urinary symptoms, nausea, vomiting, or diarrhea.  He rates pain as 9/10.  He states that his low back is constant and throbbing.  He has not had any sick contacts,  recent travel.   Sickle cell center: Complete metabolic panel shows total bilirubin elevated at 1.8, otherwise unremarkable.  Complete blood count notable for WBCs 18.1, hemoglobin 11.2, and platelets 224,000.  Patient's pain persists despite IV fluids, IV Dilaudid, Toradol, and Tylenol.  Patient will be admitted to [facility] in observation for further management of his sickle cell pain crisis.    Hospital Course:  Sickle cell disease with pain crisis: Patient was admitted for sickle cell pain crisis and managed appropriately with IVF, IV Dilaudid via PCA and IV Toradol, as well as other adjunct therapies per sickle cell pain management protocols.  Pain intensity decreased throughout admission and IV Dilaudid PCA was weaned appropriately.  Patient was transition to his home medications of oxycodone 10 mg every 6 hours as needed.  Patient will follow-up with his PCP for medication management.  Pain intensity is decreased to 3/10 and patient will be due discharge home today.  Dental abscess: During admission, patient was noted to have multiple dental caries with erythema to gums.  IV antibiotics were initiated.  Patient will discharge home with amoxicillin 500 mg 3 times daily for an additional 5 days. Patient advised to follow-up with his dentist, first available appointment.  Patient will resume his home medications.  Patient advised to hydrate consistently vital signs are stable.  Patient is alert and oriented.  Patient was therefore discharged home today in a hemodynamically stable condition.   William Jennings will follow-up with PCP  within 1 week of this discharge. William Jennings was counseled extensively about nonpharmacologic means of pain management, patient verbalized understanding and was appreciative of  the care received during this admission.   We discussed the need for good hydration, monitoring of hydration status, avoidance of heat, cold, stress, and infection triggers. We discussed  the need to be adherent with taking Hydrea and other home medications. Patient was reminded of the need to seek medical attention immediately if any symptom of bleeding, anemia, or infection occurs.  Discharge Exam: Vitals:   12/07/22 0614 12/07/22 0948  BP: (!) 108/55 112/74  Pulse: 90 95  Resp: 16 12  Temp: 98.5 F (36.9 C) 98.6 F (37 C)  SpO2: 100% 100%   Vitals:   12/06/22 2111 12/07/22 0155 12/07/22 0614 12/07/22 0948  BP: 119/78 123/66 (!) 108/55 112/74  Pulse: 98 86 90 95  Resp: 16 14 16 12   Temp: 98.7 F (37.1 C) 99.1 F (37.3 C) 98.5 F (36.9 C) 98.6 F (37 C)  TempSrc: Oral Oral Oral Oral  SpO2: 100% 100% 100% 100%  Weight:      Height:        General appearance : Awake, alert, not in any distress. Speech Clear. Not toxic looking HEENT: Atraumatic and Normocephalic, pupils equally reactive to light and accomodation Neck: Supple, no JVD. No cervical lymphadenopathy.  Chest: Good air entry bilaterally, no added sounds  CVS: S1 S2 regular, no murmurs.  Abdomen: Bowel sounds present, Non tender and not distended with no gaurding, rigidity or rebound. Extremities: B/L Lower Ext shows no edema, both legs are warm to touch Neurology: Awake alert, and oriented X 3, CN II-XII intact, Non focal Skin: No Rash  Discharge Instructions  Discharge Instructions     Discharge patient   Complete by: As directed    Discharge disposition: 01-Home or Self Care   Discharge patient date: 12/07/2022      Allergies as of 12/07/2022       Reactions   Shellfish Allergy Anaphylaxis        Medication List     TAKE these medications    albuterol 108 (90 Base) MCG/ACT inhaler Commonly known as: VENTOLIN HFA Inhale 2 puffs into the lungs every 6 (six) hours as needed for wheezing or shortness of breath.   amoxicillin 500 MG capsule Commonly known as: AMOXIL Take 1 capsule (500 mg total) by mouth 2 (two) times daily for 5 days.   ergocalciferol 1.25 MG (50000 UT)  capsule Commonly known as: VITAMIN D2 Take 1 capsule (50,000 Units total) by mouth once a week.   folic acid 1 MG tablet Commonly known as: FOLVITE Take 1 tablet (1 mg total) by mouth daily.   ibuprofen 800 MG tablet Commonly known as: ADVIL Take 1 tablet (800 mg total) by mouth every 6 (six) hours as needed for moderate pain.   Oxycodone HCl 10 MG Tabs Take 1 tablet (10 mg total) by mouth every 6 (six) hours as needed. What changed: reasons to take this        The results of significant diagnostics from this hospitalization (including imaging, microbiology, ancillary and laboratory) are listed below for reference.    Significant Diagnostic Studies: CT HEAD WO CONTRAST ( )  Result Date: 12/01/2022 CLINICAL DATA:  Headache with neuro deficit.  Numbness. EXAM: CT HEAD WITHOUT CONTRAST TECHNIQUE: Contiguous axial images were obtained from the base of the skull through the vertex without intravenous contrast. RADIATION DOSE REDUCTION: This exam was performed according to  the departmental dose-optimization program which includes automated exposure control, adjustment of the mA and/or kV according to patient size and/or use of iterative reconstruction technique. COMPARISON:  Head CT 08/14/2012.  MRI head 08/14/2012. FINDINGS: Brain: No evidence of acute infarction, hemorrhage, hydrocephalus, extra-axial collection or mass lesion/mass effect. Vascular: No hyperdense vessel or unexpected calcification. Skull: Normal. Negative for fracture or focal lesion. Sinuses/Orbits: No acute finding. Old right medial orbital wall fracture. Other: None. IMPRESSION: No acute intracranial abnormality. Electronically Signed   By: Darliss Cheney M.D.   On: 12/01/2022 15:21    Microbiology: No results found for this or any previous visit (from the past 240 hour(s)).   Labs: Basic Metabolic Panel: Recent Labs  Lab 12/01/22 0606 12/02/22 0549 12/05/22 0652 12/06/22 0457  NA 134* 132* 132* 137  K 3.8 3.6  3.0* 3.5  CL 104 104 97* 98  CO2 23 23 28 26   GLUCOSE 107* 110* 126* 126*  BUN 8 9 6  5*  CREATININE 0.84 0.91 0.86 0.76  CALCIUM 8.6* 8.5* 8.4* 9.1   Liver Function Tests: No results for input(s): "AST", "ALT", "ALKPHOS", "BILITOT", "PROT", "ALBUMIN" in the last 168 hours. No results for input(s): "LIPASE", "AMYLASE" in the last 168 hours. No results for input(s): "AMMONIA" in the last 168 hours. CBC: Recent Labs  Lab 12/01/22 0606 12/02/22 0549 12/04/22 0734 12/05/22 0652 12/06/22 0457  WBC 17.2* 14.9* 11.0* 9.6 10.2  HGB 10.0* 10.4* 8.8* 8.0* 8.1*  HCT 28.0* 28.8* 24.3* 22.0* 23.0*  MCV 77.8* 78.0* 77.6* 78.0* 78.2*  PLT 143* 137* 111* 107* 127*   Cardiac Enzymes: No results for input(s): "CKTOTAL", "CKMB", "CKMBINDEX", "TROPONINI" in the last 168 hours. BNP: Invalid input(s): "POCBNP" CBG: No results for input(s): "GLUCAP" in the last 168 hours.  Time coordinating discharge: 30 minutes  Signed: [name]  APRN, MSN, FNP-C Patient Care [facility] [address] [place], [place] [address] [phone]  [facility] 12/07/2022, 1:00 PM

## 2022-12-08 ENCOUNTER — Telehealth: Payer: Self-pay

## 2022-12-08 ENCOUNTER — Encounter: Payer: Self-pay | Admitting: Family Medicine

## 2022-12-08 NOTE — Transitions of Care (Post Inpatient/ED Visit) (Signed)
   12/08/2022  Name: William Jennings MRN: [id number] DOB: 1991/05/25  Today's TOC FU Call Status: Today's TOC FU Call Status:: Unsuccessul Call (1st Attempt) Unsuccessful Call (1st Attempt) Date: 12/08/22  Attempted to reach the patient regarding the most recent Inpatient/ED visit.  Follow Up Plan: Additional outreach attempts will be made to reach the patient to complete the Transitions of Care (Post Inpatient/ED visit) call.   Signature   Woodfin Ganja LPN [facility] Nurse Health Advisor Direct Dial [phone]

## 2022-12-09 NOTE — Transitions of Care (Post Inpatient/ED Visit) (Deleted)
   12/09/2022  Name: William Jennings MRN: [id number] DOB: Nov 03, 1990  {[id number]}

## 2022-12-09 NOTE — Transitions of Care (Post Inpatient/ED Visit) (Signed)
   12/09/2022  Name: William Jennings MRN: [id number] DOB: [birth year]  Today's TOC FU Call Status: Today's TOC FU Call Status:: Unsuccessful Call (2nd Attempt) Unsuccessful Call (1st Attempt) Date: 12/08/22  Attempted to reach the patient regarding the most recent Inpatient/ED visit.  Follow Up Plan: Additional outreach attempts will be made to reach the patient to complete the Transitions of Care (Post Inpatient/ED visit) call.   Signature   Woodfin Ganja LPN [facility] Nurse Health Advisor Direct Dial [phone]

## 2022-12-10 NOTE — Telephone Encounter (Signed)
Caller & Relationship to patient:  MRN #  [id number]   Call Back Number:   Date of Last Office Visit: 11/25/2022     Date of Next Office Visit: 12/14/2022    Medication(s) to be Refilled: Oxycodone    Preferred Pharmacy:   ** Please notify patient to allow 48-72 hours to process** **Let patient know to contact pharmacy at the end of the day to make sure medication is ready. ** **If patient has not been seen in a year or longer, book an appointment **Advise to use MyChart for refill requests OR to contact their pharmacy

## 2022-12-14 ENCOUNTER — Ambulatory Visit: Payer: Self-pay | Admitting: Family Medicine

## 2022-12-14 NOTE — Transitions of Care (Post Inpatient/ED Visit) (Signed)
   12/14/2022  Name: William Jennings MRN: [id number] DOB: [birth year]  Today's TOC FU Call Status: Today's TOC FU Call Status:: Unsuccessful Call (2nd Attempt) Unsuccessful Call (1st Attempt) Date: 12/08/22  Attempted to reach the patient regarding the most recent Inpatient/ED visit.  Follow Up Plan: No further outreach attempts will be made at this time. We have been unable to contact the patient. Pt had ov today 12-14-22  Signature   Woodfin Ganja LPN [facility] Nurse Health Advisor Direct Dial [phone]

## 2022-12-21 ENCOUNTER — Other Ambulatory Visit: Payer: Self-pay | Admitting: Family Medicine

## 2022-12-21 DIAGNOSIS — D5700 Hb-SS disease with crisis, unspecified: Secondary | ICD-10-CM

## 2022-12-21 MED ORDER — OXYCODONE HCL 10 MG PO TABS
10.0000 mg | ORAL_TABLET | Freq: Four times a day (QID) | ORAL | 0 refills | Status: DC | PRN
Start: 2022-12-21 — End: 2023-02-16

## 2022-12-21 NOTE — Telephone Encounter (Signed)
Caller & Relationship to patient:  MRN #  [id number]   Call Back Number:   Date of Last Office Visit: 11/25/2022     Date of Next Office Visit: 12/08/2022    Medication(s) to be Refilled: Oxycodone   Preferred Pharmacy:   ** Please notify patient to allow 48-72 hours to process** **Let patient know to contact pharmacy at the end of the day to make sure medication is ready. ** **If patient has not been seen in a year or longer, book an appointment **Advise to use MyChart for refill requests OR to contact their pharmacy

## 2022-12-21 NOTE — Progress Notes (Signed)
Reviewed PDMP substance reporting system prior to prescribing opiate medications. No inconsistencies noted.  Meds ordered this encounter  Medications   Oxycodone HCl 10 MG TABS    Sig: Take 1 tablet (10 mg total) by mouth every 6 (six) hours as needed.    Dispense:  60 tablet    Refill:  0    Order Specific Question:   Supervising Provider    Answer:   JEGEDE, OLUGBEMIGA E [[id number]]   William Mcmeen Moore Brodie Correll  APRN, MSN, FNP-C [facility] [address]  [place], [place] [address] [phone]  

## 2023-01-28 ENCOUNTER — Telehealth: Payer: Self-pay | Admitting: Family Medicine

## 2023-01-28 NOTE — Telephone Encounter (Signed)
Caller & Relationship to patient:  MRN #  [id number]   Call Back Number:   Date of Last Office Visit: 12/14/2022     Date of Next Office Visit: 02/22/2023    Medication(s) to be Refilled: oxycodone   Preferred Pharmacy:   ** Please notify patient to allow 48-72 hours to process** **Let patient know to contact pharmacy at the end of the day to make sure medication is ready. ** **If patient has not been seen in a year or longer, book an appointment **Advise to use MyChart for refill requests OR to contact their pharmacy

## 2023-02-16 ENCOUNTER — Other Ambulatory Visit: Payer: Self-pay | Admitting: Family Medicine

## 2023-02-16 DIAGNOSIS — D5700 Hb-SS disease with crisis, unspecified: Secondary | ICD-10-CM

## 2023-02-16 MED ORDER — OXYCODONE HCL 10 MG PO TABS
10.0000 mg | ORAL_TABLET | Freq: Four times a day (QID) | ORAL | 0 refills | Status: DC | PRN
Start: 2023-02-16 — End: 2023-05-03

## 2023-02-16 NOTE — Telephone Encounter (Signed)
Caller & Relationship to patient:  MRN #  [id number]   Call Back Number:   Date of Last Office Visit: 01/28/2023     Date of Next Office Visit: 02/22/2023    Medication(s) to be Refilled: Oxycodone   Preferred Pharmacy:   ** Please notify patient to allow 48-72 hours to process** **Let patient know to contact pharmacy at the end of the day to make sure medication is ready. ** **If patient has not been seen in a year or longer, book an appointment **Advise to use MyChart for refill requests OR to contact their pharmacy

## 2023-02-16 NOTE — Progress Notes (Signed)
Reviewed PDMP substance reporting system prior to prescribing opiate medications. No inconsistencies noted.  Meds ordered this encounter  Medications   Oxycodone HCl 10 MG TABS    Sig: Take 1 tablet (10 mg total) by mouth every 6 (six) hours as needed.    Dispense:  60 tablet    Refill:  0    Order Specific Question:   Supervising Provider    Answer:   JEGEDE, OLUGBEMIGA E [[id number]   [name] Hollis  APRN, MSN, FNP-C Patient Care [facility] [address]  [place], [place] [address] [phone]  

## 2023-02-22 ENCOUNTER — Encounter: Payer: Self-pay | Admitting: Family Medicine

## 2023-02-22 ENCOUNTER — Ambulatory Visit (INDEPENDENT_AMBULATORY_CARE_PROVIDER_SITE_OTHER): Payer: Self-pay | Admitting: Family Medicine

## 2023-02-22 VITALS — BP 104/51 | HR 62 | Temp 97.2°F | Wt 150.4 lb

## 2023-02-22 DIAGNOSIS — D5700 Hb-SS disease with crisis, unspecified: Secondary | ICD-10-CM

## 2023-02-22 DIAGNOSIS — E559 Vitamin D deficiency, unspecified: Secondary | ICD-10-CM

## 2023-02-22 MED ORDER — HYDROXYUREA 100 MG/ML ORAL SUSPENSION
500.0000 mg | Freq: Every day | ORAL | 2 refills | Status: AC
Start: 2023-02-22 — End: 2023-05-23

## 2023-02-22 NOTE — Progress Notes (Signed)
Established Patient Office Visit  Subjective   Patient ID: William Jennings, male    DOB: [birth year]  Age: 32 y.o. MRN: [id number]  Chief Complaint  Patient presents with   Hospitalization Follow-up    Sickle cell    William Jennings is a very pleasant 32 year old male with a medical history significant for sickle cell disease, chronic pain syndrome, and anemia of chronic disease that presents for a follow-up of chronic conditions.  Patient states that he has been doing very well and is without complaint.  He is inquiring about disease modifying agents today.  Patient typically has very well-controlled sickle cell disease with infrequent pain crises.  He is not having any pain on today.  He denies any headache, blurry vision, urinary symptoms, nausea, vomiting, or diarrhea.    Patient Active Problem List   Diagnosis Date Noted   Sickle cell pain crisis (HCC) 11/30/2022   Dehydration symptoms 08/03/2022   Vitamin D deficiency 08/03/2022   Cough    Chronic pain syndrome 08/14/2018   Anemia of chronic disease 08/14/2018   Sickle-cell disease with pain (HCC) 05/29/2018   Sickle cell anemia with pain (HCC) 04/05/2016   Acute chest syndrome due to sickle cell crisis (HCC)    Sickle cell anemia with crisis (HCC) 02/21/2014   Sickle cell anemia (HCC) 08/10/2013   Chest pain 08/10/2013   Dehydration 08/08/2013   Sickle cell crisis (HCC) 08/06/2013   Sickle-cell/Hb-C disease with crisis (HCC) 01/22/2013   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   Fever 06/29/2012   Leukocytosis 06/29/2012   Past Medical History:  Diagnosis Date   Chronic prescription opiate use    Sickle cell anemia (HCC)    Sickle cell disease (HCC)    Past Surgical History:  Procedure Laterality Date   CHOLECYSTECTOMY     Social History   Tobacco Use   Smoking status: Former   Smokeless tobacco: Never  Vaping Use   Vaping status: Never Used  Substance Use Topics   Alcohol use: Not Currently    Comment: occ    Drug use: No   Social History   Socioeconomic History   Marital status: Single    Spouse name: Not on file   Number of children: Not on file   Years of education: Not on file   Highest education level: Some college, no degree  Occupational History   Not on file  Tobacco Use   Smoking status: Former   Smokeless tobacco: Never  Vaping Use   Vaping status: Never Used  Substance and Sexual Activity   Alcohol use: Not Currently    Comment: occ   Drug use: No   Sexual activity: Not on file  Other Topics Concern   Not on file  Social History Narrative   Not on file   Social Determinants of Health   Financial Resource Strain: Medium Risk (12/10/2022)   Overall Financial Resource Strain (CARDIA)    Difficulty of Paying Living Expenses: Somewhat hard  Food Insecurity: Food Insecurity Present (12/10/2022)   Hunger Vital Sign    Worried About Running Out of Food in the Last Year: Sometimes true    Ran Out of Food in the Last Year: Sometimes true  Transportation Needs: No Transportation Needs (12/10/2022)   PRAPARE - Administrator, Civil Service (Medical): No    Lack of Transportation (Non-Medical): No  Physical Activity: Insufficiently Active (12/10/2022)   Exercise Vital Sign    Days of Exercise per Week:  2 days    Minutes of Exercise per Session: 20 min  Stress: No Stress Concern Present (12/10/2022)   [employer] of Occupational Health - Occupational Stress Questionnaire    Feeling of Stress : Only a little  Social Connections: Moderately Isolated (12/10/2022)   Social Connection and Isolation Panel [NHANES]    Frequency of Communication with Friends and Family: Twice a week    Frequency of Social Gatherings with Friends and Family: Once a week    Attends Religious Services: More than 4 times per year    Active Member of [employer] or Organizations: No    Attends Banker Meetings: Not on file    Marital Status: Never married  Intimate Partner  Violence: Not At Risk (11/30/2022)   Humiliation, Afraid, Rape, and Kick questionnaire    Fear of Current or Ex-Partner: No    Emotionally Abused: No    Physically Abused: No    Sexually Abused: No   Family Status  Relation Name Status   Mother  Alive   Father  Alive   Sister  Alive   Brother  Alive   Sister  Alive  No partnership data on file   Family History  Family history unknown: Yes   Allergies  Allergen Reactions   Shellfish Allergy Anaphylaxis    Review of Systems  Constitutional: Negative.   HENT: Negative.    Respiratory: Negative.    Cardiovascular: Negative.   Genitourinary: Negative.   Musculoskeletal:  Positive for back pain and joint pain.  Psychiatric/Behavioral: Negative.        Objective:     BP (!) 104/51   Pulse 62   Temp (!) 97.2 F (36.2 C)   Wt 150 lb 6.4 oz (68.2 kg)   SpO2 100%   BMI 22.87 kg/m  BP Readings from Last 3 Encounters:  02/22/23 (!) 104/51  12/07/22 112/74  11/29/22 (!) 115/95   Wt Readings from Last 3 Encounters:  02/22/23 150 lb 6.4 oz (68.2 kg)  11/30/22 154 lb 15.7 oz (70.3 kg)  11/29/22 155 lb (70.3 kg)      Physical Exam Eyes:     Pupils: Pupils are equal, round, and reactive to light.  Cardiovascular:     Pulses: Normal pulses.  Pulmonary:     Effort: Pulmonary effort is normal.  Abdominal:     General: Bowel sounds are normal.  Skin:    General: Skin is warm.  Neurological:     Mental Status: Mental status is at baseline.  Psychiatric:        Mood and Affect: Mood normal.        Behavior: Behavior normal.        Thought Content: Thought content normal.        Judgment: Judgment normal.      Results for orders placed or performed in visit on 02/22/23  Sickle Cell Panel  Result Value Ref Range   Glucose 92 70 - 99 mg/dL   BUN 7 6 - 20 mg/dL   Creatinine, Ser 1.61 0.76 - 1.27 mg/dL   eGFR 096 >04 VW/UJW/1.19   BUN/Creatinine Ratio 8 (L) 9 - 20   Sodium 142 134 - 144 mmol/L   Potassium 4.9  3.5 - 5.2 mmol/L   Chloride 106 96 - 106 mmol/L   CO2 23 20 - 29 mmol/L   Calcium 9.2 8.7 - 10.2 mg/dL   Total Protein 7.4 6.0 - 8.5 g/dL   Albumin 4.6 4.1 - 5.1 g/dL  Globulin, Total 2.8 1.5 - 4.5 g/dL   Bilirubin Total 1.4 (H) 0.0 - 1.2 mg/dL   Alkaline Phosphatase 142 (H) 44 - 121 IU/L   AST 30 0 - 40 IU/L   ALT 15 0 - 44 IU/L   Ferritin 200 30 - 400 ng/mL   Vit D, 25-Hydroxy 12.7 (L) 30.0 - 100.0 ng/mL   WBC 4.4 3.4 - 10.8 x10E3/uL   RBC 4.67 4.14 - 5.80 x10E6/uL   Hemoglobin 13.1 13.0 - 17.7 g/dL   Hematocrit 54.0 98.1 - 51.0 %   MCV 88 79 - 97 fL   MCH 28.1 26.6 - 33.0 pg   MCHC 31.9 31.5 - 35.7 g/dL   RDW 19.1 (H) 47.8 - 29.5 %   Platelets 185 150 - 450 x10E3/uL   Neutrophils 35 Not Estab. %   Lymphs 51 Not Estab. %   Monocytes 12 Not Estab. %   Eos 1 Not Estab. %   Basos 1 Not Estab. %   Neutrophils Absolute 1.6 1.4 - 7.0 x10E3/uL   Lymphocytes Absolute 2.3 0.7 - 3.1 x10E3/uL   Monocytes Absolute 0.5 0.1 - 0.9 x10E3/uL   EOS (ABSOLUTE) 0.0 0.0 - 0.4 x10E3/uL   Basophils Absolute 0.0 0.0 - 0.2 x10E3/uL   Immature Granulocytes 0 Not Estab. %   Immature Grans (Abs) 0.0 0.0 - 0.1 x10E3/uL   NRBC 1 (H) 0 - 0 %   Retic Ct Pct 2.9 (H) 0.6 - 2.6 %    Last CBC Lab Results  Component Value Date   WBC 4.4 02/22/2023   HGB 13.1 02/22/2023   HCT 41.1 02/22/2023   MCV 88 02/22/2023   MCH 28.1 02/22/2023   RDW 15.8 (H) 02/22/2023   PLT 185 02/22/2023   Last metabolic panel Lab Results  Component Value Date   GLUCOSE 92 02/22/2023   NA 142 02/22/2023   K 4.9 02/22/2023   CL 106 02/22/2023   CO2 23 02/22/2023   BUN 7 02/22/2023   CREATININE 0.92 02/22/2023   GFRNONAA >60 12/06/2022   CALCIUM 9.2 02/22/2023   PROT 7.4 02/22/2023   ALBUMIN 4.6 02/22/2023   LABGLOB 2.8 02/22/2023   AGRATIO 1.5 02/24/2022   BILITOT 1.4 (H) 02/22/2023   ALKPHOS 142 (H) 02/22/2023   AST 30 02/22/2023   ALT 15 02/22/2023   ANIONGAP 13 12/06/2022   Last lipids Lab Results   Component Value Date   CHOL 103 11/18/2021   HDL 40 11/18/2021   LDLCALC 51 11/18/2021   TRIG 46 11/18/2021   CHOLHDL 2.6 11/18/2021   Last hemoglobin A1c No results found for: "HGBA1C" Last thyroid functions No results found for: "TSH", "T3TOTAL", "T4TOTAL", "THYROIDAB" Last vitamin D Lab Results  Component Value Date   VD25OH 12.7 (L) 02/22/2023   Last vitamin B12 and Folate Lab Results  Component Value Date   VITAMINB12 257 02/01/2010   FOLATE  02/01/2010    9.3 (NOTE)  Reference Ranges        Deficient:       0.4 - 3.3 ng/mL        Indeterminate:   3.4 - 5.4 ng/mL        Normal:              > 5.4 ng/mL      The ASCVD Risk score (Arnett DK, et al., 2019) failed to calculate for the following reasons:   The 2019 ASCVD risk score is only valid for ages 72 to 10  Assessment & Plan:   Problem List Items Addressed This Visit       Other   Vitamin D deficiency   Relevant Orders   Sickle Cell Panel (Completed)   Sickle cell anemia with pain (HCC) - Primary   Relevant Medications   hydroxyurea (HYDREA) 100 mg/mL SUSP   Other Relevant Orders   Sickle Cell Panel (Completed)   1. Sickle cell anemia with pain (HCC) Initiate hydroxyurea.  We discussed the need for good hydration, monitoring of hydration status, avoidance of heat, cold, stress, and infection triggers. We discussed the risks and benefits of Hydrea, including bone marrow suppression, the possibility of GI upset, skin ulcers, hair thinning, and teratogenicity. The patient was reminded of the need to seek medical attention of any symptoms of bleeding, anemia, or infection. Continue folic acid 1 mg daily to prevent aplastic bone marrow crises.   - Sickle Cell Panel - hydroxyurea (HYDREA) 100 mg/mL SUSP; Take 5 mLs (500 mg total) by mouth daily.  Dispense: 150 mL; Refill: 2  2. Vitamin D deficiency  - Sickle Cell Panel  Return in about 3 months (around 05/24/2023) for sickle cell anemia.   [name]  APRN, MSN, FNP-C Patient Care [facility] [address] [place], [place] [address] [phone]

## 2023-02-23 LAB — CMP14+CBC/D/PLT+FER+RETIC+V...
ALT: 15 IU/L (ref 0–44)
AST: 30 IU/L (ref 0–40)
Albumin: 4.6 g/dL (ref 4.1–5.1)
Alkaline Phosphatase: 142 IU/L — ABNORMAL HIGH (ref 44–121)
BUN/Creatinine Ratio: 8 — ABNORMAL LOW (ref 9–20)
BUN: 7 mg/dL (ref 6–20)
Basophils Absolute: 0 10*3/uL (ref 0.0–0.2)
Basos: 1 %
Bilirubin Total: 1.4 mg/dL — ABNORMAL HIGH (ref 0.0–1.2)
CO2: 23 mmol/L (ref 20–29)
Calcium: 9.2 mg/dL (ref 8.7–10.2)
Chloride: 106 mmol/L (ref 96–106)
Creatinine, Ser: 0.92 mg/dL (ref 0.76–1.27)
EOS (ABSOLUTE): 0 10*3/uL (ref 0.0–0.4)
Eos: 1 %
Ferritin: 200 ng/mL (ref 30–400)
Globulin, Total: 2.8 g/dL (ref 1.5–4.5)
Glucose: 92 mg/dL (ref 70–99)
Hematocrit: 41.1 % (ref 37.5–51.0)
Hemoglobin: 13.1 g/dL (ref 13.0–17.7)
Immature Grans (Abs): 0 10*3/uL (ref 0.0–0.1)
Immature Granulocytes: 0 %
Lymphocytes Absolute: 2.3 10*3/uL (ref 0.7–3.1)
Lymphs: 51 %
MCH: 28.1 pg (ref 26.6–33.0)
MCHC: 31.9 g/dL (ref 31.5–35.7)
MCV: 88 fL (ref 79–97)
Monocytes Absolute: 0.5 10*3/uL (ref 0.1–0.9)
Monocytes: 12 %
NRBC: 1 % — ABNORMAL HIGH (ref 0–0)
Neutrophils Absolute: 1.6 10*3/uL (ref 1.4–7.0)
Neutrophils: 35 %
Platelets: 185 10*3/uL (ref 150–450)
Potassium: 4.9 mmol/L (ref 3.5–5.2)
RBC: 4.67 x10E6/uL (ref 4.14–5.80)
RDW: 15.8 % — ABNORMAL HIGH (ref 11.6–15.4)
Retic Ct Pct: 2.9 % — ABNORMAL HIGH (ref 0.6–2.6)
Sodium: 142 mmol/L (ref 134–144)
Total Protein: 7.4 g/dL (ref 6.0–8.5)
Vit D, 25-Hydroxy: 12.7 ng/mL — ABNORMAL LOW (ref 30.0–100.0)
WBC: 4.4 10*3/uL (ref 3.4–10.8)
eGFR: 112 mL/min/{1.73_m2} (ref >59)

## 2023-03-04 ENCOUNTER — Other Ambulatory Visit: Payer: Self-pay | Admitting: Family Medicine

## 2023-03-04 DIAGNOSIS — E559 Vitamin D deficiency, unspecified: Secondary | ICD-10-CM

## 2023-03-04 MED ORDER — ERGOCALCIFEROL 1.25 MG (50000 UT) PO CAPS: 50000.0000 [IU] | ORAL_CAPSULE | ORAL | 3 refills | Status: DC

## 2023-03-04 NOTE — Progress Notes (Signed)
Reviewed labs. Vitamin D level markedly decreased. Inform patient that vitamin D level is decreased, which is consistent with vitamin d deficiency. Will start drisdol 50,000 IU weekly. Recommend a vitamin D fortified diet (yogurt, mackerel, almond milk, salmon, etc.)  Will recheck level in 12 weeks.  [name]  APRN, MSN, FNP-C Patient Care [facility] [address] [place], [place] [address] [phone]

## 2023-04-22 DIAGNOSIS — D5700 Hb-SS disease with crisis, unspecified: Secondary | ICD-10-CM

## 2023-05-03 MED ORDER — IBUPROFEN 800 MG PO TABS
800.0000 mg | ORAL_TABLET | Freq: Four times a day (QID) | ORAL | 5 refills | Status: DC | PRN
Start: 2023-05-03 — End: 2023-09-21

## 2023-05-03 MED ORDER — OXYCODONE HCL 10 MG PO TABS
10.0000 mg | ORAL_TABLET | Freq: Four times a day (QID) | ORAL | 0 refills | Status: DC | PRN
Start: 2023-05-03 — End: 2023-07-11

## 2023-05-03 NOTE — Telephone Encounter (Signed)
Caller & Relationship to patient:  MRN #  [id number]   Call Back Number:   Date of Last Office Visit: 02/22/2023     Date of Next Office Visit: 05/24/2023    Medication(s) to be Refilled: Oxy and Ibuprofen   Preferred Pharmacy:   ** Please notify patient to allow 48-72 hours to process** **Let patient know to contact pharmacy at the end of the day to make sure medication is ready. ** **If patient has not been seen in a year or longer, book an appointment **Advise to use MyChart for refill requests OR to contact their pharmacy

## 2023-05-03 NOTE — Telephone Encounter (Signed)
Reviewed PDMP substance reporting system prior to prescribing opiate medications. No inconsistencies noted.  Meds ordered this encounter  Medications   Oxycodone HCl 10 MG TABS    Sig: Take 1 tablet (10 mg total) by mouth every 6 (six) hours as needed.    Dispense:  60 tablet    Refill:  0    Order Specific Question:   Supervising Provider    Answer:   Quentin Angst [[id number]]   ibuprofen (ADVIL) 800 MG tablet    Sig: Take 1 tablet (800 mg total) by mouth every 6 (six) hours as needed for moderate pain (pain score 4-6).    Dispense:  30 tablet    Refill:  5    Order Specific Question:   Supervising Provider    Answer:   Quentin Angst [[id number]]   [name]  APRN, MSN, FNP-C Patient Care [facility] [address] [place], [place] [address] [phone]

## 2023-05-24 ENCOUNTER — Ambulatory Visit: Payer: Self-pay | Admitting: Family Medicine

## 2023-07-11 ENCOUNTER — Other Ambulatory Visit: Payer: Self-pay | Admitting: Family Medicine

## 2023-07-11 DIAGNOSIS — D5700 Hb-SS disease with crisis, unspecified: Secondary | ICD-10-CM

## 2023-07-11 NOTE — Telephone Encounter (Signed)
Copied from CRM [id number]. Topic: Clinical - Medication Refill >> Jul 11, 2023  9:52 AM Phill Myron wrote: Most Recent Primary Care Visit:  Provider: Massie Maroon  Department: [facility]  Visit Type: OFFICE VISIT  Date: 02/22/2023  Medication: Oxycodone HCl 10 MG TABS.Marland Kitchen   Has the patient contacted their pharmacy? No (Agent: If no, request that the patient contact the pharmacy for the refill. If patient does not wish to contact the pharmacy document the reason why and proceed with request.) (Agent: If yes, when and what did the pharmacy advise?)  Is this the correct pharmacy for this prescription? Yes If no, delete pharmacy and type the correct one.  This is the patient's preferred pharmacy:  [id number] [place], [place] - [address] [place] [address] Phone: [phone] Fax: [phone]    Has the prescription been filled recently? Yes  Is the patient out of the medication? Yes  Has the patient been seen for an appointment in the last year OR does the patient have an upcoming appointment? Yes  Can we respond through MyChart? no  Agent: Please be advised that Rx refills may take up to 3 business days. We ask that you follow-up with your pharmacy.

## 2023-07-13 MED ORDER — OXYCODONE HCL 10 MG PO TABS
10.0000 mg | ORAL_TABLET | Freq: Four times a day (QID) | ORAL | 0 refills | Status: DC | PRN
Start: 2023-07-13 — End: 2023-09-21

## 2023-08-09 ENCOUNTER — Ambulatory Visit: Payer: Self-pay | Admitting: Nurse Practitioner

## 2023-09-12 ENCOUNTER — Other Ambulatory Visit: Payer: Self-pay | Admitting: Family Medicine

## 2023-09-12 DIAGNOSIS — D5700 Hb-SS disease with crisis, unspecified: Secondary | ICD-10-CM

## 2023-09-12 NOTE — Telephone Encounter (Signed)
 Copied from CRM [id number]. Topic: Clinical - Medication Refill >> Sep 12, 2023  3:52 PM Everette C wrote: Most Recent Primary Care Visit:  Provider: Massie Maroon  Department: [facility]  Visit Type: OFFICE VISIT  Date: 02/22/2023  Medication: Oxycodone HCl 10 MG TABS [[id number]]  ibuprofen (ADVIL) 800 MG tablet [[id number]]  Has the patient contacted their pharmacy? No (Agent: If no, request that the patient contact the pharmacy for the refill. If patient does not wish to contact the pharmacy document the reason why and proceed with request.) (Agent: If yes, when and what did the pharmacy advise?)  Is this the correct pharmacy for this prescription? Yes If no, delete pharmacy and type the correct one.  This is the patient's preferred pharmacy:  [id number] [place] [address], [place] - [address] [place] [address] Phone: [phone] Fax: [phone]   Has the prescription been filled recently? Yes  Is the patient out of the medication? Yes  Has the patient been seen for an appointment in the last year OR does the patient have an upcoming appointment? Yes  Can we respond through MyChart? No  Agent: Please be advised that Rx refills may take up to 3 business days. We ask that you follow-up with your pharmacy.

## 2023-09-21 ENCOUNTER — Other Ambulatory Visit: Payer: Self-pay | Admitting: Family Medicine

## 2023-09-21 DIAGNOSIS — D5700 Hb-SS disease with crisis, unspecified: Secondary | ICD-10-CM

## 2023-09-21 MED ORDER — OXYCODONE HCL 10 MG PO TABS: 10.0000 mg | ORAL_TABLET | Freq: Four times a day (QID) | ORAL | 0 refills | Status: DC | PRN

## 2023-09-21 MED ORDER — IBUPROFEN 800 MG PO TABS
800.0000 mg | ORAL_TABLET | Freq: Four times a day (QID) | ORAL | 5 refills | Status: DC | PRN
Start: 2023-09-21 — End: 2023-10-10

## 2023-09-21 NOTE — Telephone Encounter (Signed)
 Please advise Due to Pt not establishing with F[facility] yet. Thank you . KH

## 2023-09-21 NOTE — Telephone Encounter (Signed)
 Copied from CRM [id number]. Topic: Clinical - Medication Refill >> Sep 21, 2023  8:18 AM Gery Pray wrote: Most Recent Primary Care Visit:  Provider: Massie Maroon  Department: [facility]  Visit Type: OFFICE VISIT  Date: 02/22/2023  Medication:  ibuprofen (ADVIL) 800 MG tablet  Oxycodone HCl 10 MG TABS  Has the patient contacted their pharmacy? Yes (Agent: If no, request that the patient contact the pharmacy for the refill. If patient does not wish to contact the pharmacy document the reason why and proceed with request.) (Agent: If yes, when and what did the pharmacy advise?) Pharmacy stated they have not received a response  Is this the correct pharmacy for this prescription? Yes If no, delete pharmacy and type the correct one.  This is the patient's preferred pharmacy:  [id number] [place] [address], [place] - [address] [place] [address] Phone: [phone] Fax: [phone]    Has the prescription been filled recently? No  Is the patient out of the medication? Yes  Has the patient been seen for an appointment in the last year OR does the patient have an upcoming appointment? Yes  Can we respond through MyChart? Yes  Agent: Please be advised that Rx refills may take up to 3 business days. We ask that you follow-up with your pharmacy.

## 2023-10-10 ENCOUNTER — Other Ambulatory Visit: Payer: Self-pay | Admitting: Nurse Practitioner

## 2023-10-10 DIAGNOSIS — D5700 Hb-SS disease with crisis, unspecified: Secondary | ICD-10-CM

## 2023-10-10 MED ORDER — OXYCODONE HCL 10 MG PO TABS
10.0000 mg | ORAL_TABLET | Freq: Four times a day (QID) | ORAL | 0 refills | Status: DC | PRN
Start: 2023-10-10 — End: 2023-12-07

## 2023-10-10 MED ORDER — IBUPROFEN 800 MG PO TABS: 800.0000 mg | ORAL_TABLET | Freq: Four times a day (QID) | ORAL | 5 refills | Status: DC | PRN

## 2023-10-10 NOTE — Progress Notes (Signed)
 Medication [facility]. PDMP website reviewed.

## 2023-10-26 ENCOUNTER — Emergency Department (HOSPITAL_BASED_OUTPATIENT_CLINIC_OR_DEPARTMENT_OTHER)
Admission: EM | Admit: 2023-10-26 | Discharge: 2023-10-26 | Disposition: A | Discharge status: Home / Self Care | Payer: Self-pay | Attending: Emergency Medicine | Admitting: Emergency Medicine

## 2023-10-26 ENCOUNTER — Emergency Department (HOSPITAL_BASED_OUTPATIENT_CLINIC_OR_DEPARTMENT_OTHER): Payer: Self-pay

## 2023-10-26 ENCOUNTER — Other Ambulatory Visit: Payer: Self-pay

## 2023-10-26 DIAGNOSIS — R079 Chest pain, unspecified: Secondary | ICD-10-CM

## 2023-10-26 DIAGNOSIS — Z720 Tobacco use: Secondary | ICD-10-CM | POA: Insufficient documentation

## 2023-10-26 DIAGNOSIS — D57219 Sickle-cell/Hb-C disease with crisis, unspecified: Secondary | ICD-10-CM | POA: Insufficient documentation

## 2023-10-26 DIAGNOSIS — D5700 Hb-SS disease with crisis, unspecified: Secondary | ICD-10-CM

## 2023-10-26 LAB — BASIC METABOLIC PANEL WITH GFR
Anion gap: 9 (ref 5–15)
BUN: 8 mg/dL (ref 6–20)
CO2: 25 mmol/L (ref 22–32)
Calcium: 9 mg/dL (ref 8.9–10.3)
Chloride: 106 mmol/L (ref 98–111)
Creatinine, Ser: 0.93 mg/dL (ref 0.61–1.24)
GFR, Estimated: >60 mL/min (ref >60)
Glucose, Bld: 101 mg/dL — ABNORMAL HIGH (ref 70–99)
Potassium: 4 mmol/L (ref 3.5–5.1)
Sodium: 140 mmol/L (ref 135–145)

## 2023-10-26 LAB — CBC
HCT: 32.3 % — ABNORMAL LOW (ref 39.0–52.0)
Hemoglobin: 11.9 g/dL — ABNORMAL LOW (ref 13.0–17.0)
MCH: 28 pg (ref 26.0–34.0)
MCHC: 36.8 g/dL — ABNORMAL HIGH (ref 30.0–36.0)
MCV: 76 fL — ABNORMAL LOW (ref 80.0–100.0)
Platelets: 221 10*3/uL (ref 150–400)
RBC: 4.25 MIL/uL (ref 4.22–5.81)
RDW: 14.7 % (ref 11.5–15.5)
WBC: 6 10*3/uL (ref 4.0–10.5)
nRBC: 1 % — ABNORMAL HIGH (ref 0.0–0.2)

## 2023-10-26 LAB — RETICULOCYTES
Immature Retic Fract: 35.2 % — ABNORMAL HIGH (ref 2.3–15.9)
RBC.: 4.25 MIL/uL (ref 4.22–5.81)
Retic Count, Absolute: 167 10*3/uL (ref 19.0–186.0)
Retic Ct Pct: 3.9 % — ABNORMAL HIGH (ref 0.4–3.1)

## 2023-10-26 LAB — TROPONIN T, HIGH SENSITIVITY: Troponin T High Sensitivity: <15 ng/L (ref <19)

## 2023-10-26 MED ORDER — KETOROLAC TROMETHAMINE 15 MG/ML IJ SOLN
15.0000 mg | Freq: Once | INTRAMUSCULAR | Status: AC
Start: 2023-10-26 — End: 2023-10-26
  Administered 2023-10-26: 15 mg via INTRAVENOUS
  Filled 2023-10-26: qty 1

## 2023-10-26 MED ORDER — HYDROMORPHONE HCL 1 MG/ML IJ SOLN
1.0000 mg | Freq: Once | INTRAMUSCULAR | Status: AC
  Administered 2023-10-26: 1 mg via INTRAVENOUS
  Filled 2023-10-26: qty 1

## 2023-10-26 NOTE — Discharge Instructions (Addendum)
 Your pain is likely due to sickle cell crisis.  Please resume taking your home medication for pain control but follow-up closely with your doctor for further care.  Fortunately no evidence of acute chest syndrome or signs of heart injury.

## 2023-10-26 NOTE — ED Provider Notes (Signed)
 [facility] Provider Note   CSN: [id number] Arrival date & time: 10/26/23  1016     History  No chief complaint on file.   William Jennings is a 33 y.o. male.  The history is provided by the patient and medical records. No language interpreter was used.     33 year old male history of sickle cell disease, chronic pain syndrome presenting to the ER with complaints of chest pain.  Patient noticing pain to the right side of his chest that started this morning.  Pain is described as a pressure and sharp sensation that been persistent and quite intense.  He endorsed some mild shortness of breath.  He does not endorse any fever or chills no nausea vomiting diarrhea no lightheadedness no dizziness no diaphoresis.  Pain felt somewhat similar to prior sickle cell pain.  He denies any significant cardiac history.  He does use tobacco on occasion.  He denies any recent travel and no history of PE or DVT.  He did not take his usual medication this morning.  Home Medications Prior to Admission medications   Medication Sig Start Date End Date Taking? Authorizing Provider  albuterol  (VENTOLIN  HFA) 108 (90 Base) MCG/ACT inhaler Inhale 2 puffs into the lungs every 6 (six) hours as needed for wheezing or shortness of breath. Patient not taking: Reported on 08/03/2022 11/18/21   Jerrlyn Morel, NP  ergocalciferol  (VITAMIN D2) 1.25 MG (50000 UT) capsule Take 1 capsule (50,000 Units total) by mouth once a week. 03/04/23   Sigurd Driver, FNP  folic acid  (FOLVITE ) 1 MG tablet Take 1 tablet (1 mg total) by mouth daily. 12/07/22   Sigurd Driver, FNP  ibuprofen  (ADVIL ) 800 MG tablet Take 1 tablet (800 mg total) by mouth every 6 (six) hours as needed for moderate pain (pain score 4-6). 10/10/23   Ijaola, Onyeje M, NP  Oxycodone  HCl 10 MG TABS Take 1 tablet (10 mg total) by mouth every 6 (six) hours as needed. 10/10/23   Lorel Roes, NP      Allergies     Shellfish allergy    Review of Systems   Review of Systems  All other systems reviewed and are negative.   Physical Exam Updated Vital Signs BP 115/71   Pulse 80   Temp 98.5 F (36.9 C) (Oral)   Resp (!) 24   SpO2 100%  Physical Exam Constitutional:      General: He is not in acute distress.    Appearance: He is well-developed.  HENT:     Head: Atraumatic.  Eyes:     Conjunctiva/sclera: Conjunctivae normal.  Cardiovascular:     Rate and Rhythm: Normal rate and regular rhythm.     Pulses: Normal pulses.     Heart sounds: Normal heart sounds.  Pulmonary:     Effort: Pulmonary effort is normal.     Breath sounds: Normal breath sounds. No wheezing, rhonchi or rales.  Chest:     Chest wall: Tenderness (Mild tenderness to right anterior chest wall without any overlying skin changes) present.  Abdominal:     Palpations: Abdomen is soft.     Tenderness: There is no abdominal tenderness.  Musculoskeletal:     Cervical back: Normal range of motion and neck supple.  Skin:    Findings: No rash.  Neurological:     Mental Status: He is alert.     ED Results / Procedures / Treatments   Labs (all  labs ordered are listed, but only abnormal results are displayed) Labs Reviewed  BASIC METABOLIC PANEL WITH GFR - Abnormal; Notable for the following components:      Result Value   Glucose, Bld 101 (*)    All other components within normal limits  CBC - Abnormal; Notable for the following components:   Hemoglobin 11.9 (*)    HCT 32.3 (*)    MCV 76.0 (*)    MCHC 36.8 (*)    nRBC 1.0 (*)    All other components within normal limits  RETICULOCYTES - Abnormal; Notable for the following components:   Retic Ct Pct 3.9 (*)    Immature Retic Fract 35.2 (*)    All other components within normal limits  TROPONIN T, HIGH SENSITIVITY    EKG None  Date: 10/26/2023  Rate: 74  Rhythm: normal sinus rhythm  QRS Axis: normal  Intervals: normal  ST/T Wave abnormalities: normal   Conduction Disutrbances: none  Narrative Interpretation: early repol  Old EKG Reviewed: No significant changes noted    Radiology DG Chest 2 View Result Date: 10/26/2023 CLINICAL DATA:  Chest pain. EXAM: CHEST - 2 VIEW COMPARISON:  August 07, 2022. FINDINGS: The heart size and mediastinal contours are within normal limits. Both lungs are clear. The visualized skeletal structures are unremarkable. IMPRESSION: No active cardiopulmonary disease. Electronically Signed   By: Rosalene Colon M.D.   On: 10/26/2023 11:00    Procedures Procedures    Medications Ordered in ED Medications  ketorolac  (TORADOL ) 15 MG/ML injection 15 mg (15 mg Intravenous Given 10/26/23 1122)  HYDROmorphone  (DILAUDID ) injection 1 mg (1 mg Intravenous Given 10/26/23 1258)    ED Course/ Medical Decision Making/ A&P                                 Medical Decision Making Amount and/or Complexity of Data Reviewed Labs: ordered. Radiology: ordered.  Risk Prescription drug management.   BP 115/71   Pulse 80   Temp 98.5 F (36.9 C) (Oral)   Resp (!) 24   SpO2 100%   75:87 AM  33 year old male history of sickle cell disease, chronic pain syndrome presenting to the ER with complaints of chest pain.  Patient noticing pain to the right side of his chest that started this morning.  Pain is described as a pressure and sharp sensation that been persistent and quite intense.  He endorsed some mild shortness of breath.  He does not endorse any fever or chills no nausea vomiting diarrhea no lightheadedness no dizziness no diaphoresis.  Pain felt somewhat similar to prior sickle cell pain.  He denies any significant cardiac history.  He does use tobacco on occasion.  He denies any recent travel and no history of PE or DVT.  He did not take his usual medication this morning.  Exam overall reassuring patient is nontoxic in appearance.  Mild tenderness to the anterior chest wall on the right side without overlying skin  changes.  Workup today overall reassuring.  Suspect chest pain is likely sickle cell pain crisis and less likely to be acute chest syndrome or ACS.  Heart score of 1.  -Labs ordered, independently viewed and interpreted by me.  Labs remarkable for reassuring labs value.  Normal troponin -The patient was maintained on a cardiac monitor.  I personally viewed and interpreted the cardiac monitored which showed an underlying rhythm of: NSR -Imaging independently viewed and  interpreted by me and I agree with radiologist's interpretation.  Result remarkable for cxr unremarkable -This patient presents to the ED for concern of chest pain, this involves an extensive number of treatment options, and is a complaint that carries with it a high risk of complications and morbidity.  The differential diagnosis includes sickle cell pain, acute chest syndrome, acs, costochondritis, pneumonia, pe, gerd -Co morbidities that complicate the patient evaluation includes sickle cell disease -Treatment includes toradol  and dilaudid  -Reevaluation of the patient after these medicines showed that the patient improved -PCP office notes or outside notes reviewed -Escalation to admission/observation considered: patients feels much better, is comfortable with discharge, and will follow up with PCP -Prescription medication considered, patient comfortable with home medication -Social Determinant of Health considered which includes tobacco use           Final Clinical Impression(s) / ED Diagnoses Final diagnoses:  Sickle cell pain crisis (HCC)  Nonspecific chest pain    Rx / DC Orders ED Discharge Orders     None         Debbra Fairy, PA-C 10/26/23 1405    Hershel Los, MD 10/26/23 1417

## 2023-10-26 NOTE — ED Triage Notes (Signed)
 Pt awoke this morning with a "severe pressure" in his chest along with SOB even at rest.  Pt states he has felt pains like this before and that he has waited things out in the past and they've resolved on their own, but that his PMD has advised him to seek evaluation next time he felt the chest pains or pressure.  Pt reports pressure is constant since 830am.  Denies n/v  AAOx4 in triage.  Able to ambulate to room without distress.

## 2023-11-21 ENCOUNTER — Other Ambulatory Visit: Payer: Self-pay | Admitting: Family Medicine

## 2023-11-21 ENCOUNTER — Encounter: Payer: Self-pay | Admitting: Nurse Practitioner

## 2023-11-21 ENCOUNTER — Other Ambulatory Visit: Payer: Self-pay | Admitting: Nurse Practitioner

## 2023-11-21 DIAGNOSIS — D5700 Hb-SS disease with crisis, unspecified: Secondary | ICD-10-CM

## 2023-11-21 MED ORDER — IBUPROFEN 800 MG PO TABS: 800.0000 mg | ORAL_TABLET | Freq: Four times a day (QID) | ORAL | 5 refills | Status: DC | PRN

## 2023-11-21 NOTE — Telephone Encounter (Signed)
 Copied from CRM [id number]. Topic: Clinical - Medication Refill >> Nov 21, 2023  1:28 PM Everette C wrote: Medication: Oxycodone  HCl 10 MG TABS [[id number]]   ibuprofen  (ADVIL ) 800 MG tablet [[id number]]   Has the patient contacted their pharmacy? Yes (Agent: If no, request that the patient contact the pharmacy for the refill. If patient does not wish to contact the pharmacy document the reason why and proceed with request.) (Agent: If yes, when and what did the pharmacy advise?)  This is the patient's preferred pharmacy:  [id number] [address] [address], [place] - [address] [place] [address] Phone: [phone] Fax: [phone]  Is this the correct pharmacy for this prescription? Yes If no, delete pharmacy and type the correct one.   Has the prescription been filled recently? Yes  Is the patient out of the medication? No  Has the patient been seen for an appointment in the last year OR does the patient have an upcoming appointment? Yes  Can we respond through MyChart? No  Agent: Please be advised that Rx refills may take up to 3 business days. We ask that you follow-up with your pharmacy.

## 2023-12-07 ENCOUNTER — Telehealth: Payer: Self-pay

## 2023-12-07 ENCOUNTER — Other Ambulatory Visit: Payer: Self-pay

## 2023-12-07 ENCOUNTER — Other Ambulatory Visit: Payer: Self-pay | Admitting: Internal Medicine

## 2023-12-07 DIAGNOSIS — D5700 Hb-SS disease with crisis, unspecified: Secondary | ICD-10-CM

## 2023-12-07 MED ORDER — OXYCODONE HCL 10 MG PO TABS: 10.0000 mg | ORAL_TABLET | Freq: Four times a day (QID) | ORAL | 0 refills | Status: DC | PRN

## 2023-12-07 NOTE — Telephone Encounter (Signed)
 Copied from CRM [id number]. Topic: Clinical - Medication Question >> Dec 07, 2023 12:03 PM Silvana PARAS wrote: Reason for CRM: Pt calling to f/u on medication refill request submitted 11/21/2023. Callback number is [phone].

## 2024-01-04 ENCOUNTER — Ambulatory Visit: Payer: Self-pay | Admitting: Nurse Practitioner

## 2024-02-22 ENCOUNTER — Other Ambulatory Visit: Payer: Self-pay | Admitting: Internal Medicine

## 2024-02-22 DIAGNOSIS — D5700 Hb-SS disease with crisis, unspecified: Secondary | ICD-10-CM

## 2024-02-22 NOTE — Telephone Encounter (Unsigned)
 Copied from CRM #[id number]. Topic: Clinical - Medication Refill >> Feb 22, 2024  5:20 PM DeAngela L wrote: Medication: Oxycodone  HCl 10 MG TABS  Has the patient contacted their pharmacy? Yes  (Agent: If no, request that the patient contact the pharmacy for the refill. If patient does not wish to contact the pharmacy document the reason why and proceed with request.) (Agent: If yes, when and what did the pharmacy advise?)  This is the patient's preferred pharmacy:  [id number] [address], [place] - [address] [place] [address] Phone: [phone] Fax: [phone]  Is this the correct pharmacy for this prescription? Yes  If no, delete pharmacy and type the correct one.   Has the prescription been filled recently? Yes   Is the patient out of the medication? Yes   Has the patient been seen for an appointment in the last year OR does the patient have an upcoming appointment? No   Can we respond through MyChart? No   Agent: Please be advised that Rx refills may take up to 3 business days. We ask that you follow-up with your pharmacy.

## 2024-02-23 NOTE — Telephone Encounter (Signed)
 Pt has no showed last two appt. Kh

## 2024-05-14 ENCOUNTER — Telehealth: Payer: Self-pay | Admitting: Family Medicine

## 2024-05-14 NOTE — Telephone Encounter (Signed)
 Copied from CRM #[id number]. Topic: Appointments - Appointment Info/Confirmation >> May 14, 2024  1:13 PM [name] wrote: Patient/patient representative is calling for information regarding an appointment.  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 He would like come in this week any time. Please call him at [phone] to schedule. He also has [employer] and he will find the card. Thanks

## 2024-05-16 ENCOUNTER — Ambulatory Visit: Payer: Self-pay | Admitting: Nurse Practitioner

## 2024-05-28 ENCOUNTER — Encounter (HOSPITAL_BASED_OUTPATIENT_CLINIC_OR_DEPARTMENT_OTHER): Payer: Self-pay

## 2024-05-28 ENCOUNTER — Emergency Department (HOSPITAL_BASED_OUTPATIENT_CLINIC_OR_DEPARTMENT_OTHER)
Admission: EM | Admit: 2024-05-28 | Discharge: 2024-05-28 | Disposition: A | Discharge status: Home / Self Care | Payer: Self-pay | Attending: Emergency Medicine | Admitting: Emergency Medicine

## 2024-05-28 ENCOUNTER — Other Ambulatory Visit: Payer: Self-pay

## 2024-05-28 DIAGNOSIS — D5700 Hb-SS disease with crisis, unspecified: Secondary | ICD-10-CM

## 2024-05-28 LAB — CBC WITH DIFFERENTIAL/PLATELET
Abs Immature Granulocytes: 0.03 K/uL (ref 0.00–0.07)
Basophils Absolute: 0 K/uL (ref 0.0–0.1)
Basophils Relative: 0 %
Eosinophils Absolute: 0.1 K/uL (ref 0.0–0.5)
Eosinophils Relative: 1 %
HCT: 33.2 % — ABNORMAL LOW (ref 39.0–52.0)
Hemoglobin: 12.1 g/dL — ABNORMAL LOW (ref 13.0–17.0)
Immature Granulocytes: 1 %
Lymphocytes Relative: 38 %
Lymphs Abs: 2.2 K/uL (ref 0.7–4.0)
MCH: 28.4 pg (ref 26.0–34.0)
MCHC: 36.4 g/dL — ABNORMAL HIGH (ref 30.0–36.0)
MCV: 77.9 fL — ABNORMAL LOW (ref 80.0–100.0)
Monocytes Absolute: 0.9 K/uL (ref 0.1–1.0)
Monocytes Relative: 16 %
Neutro Abs: 2.4 K/uL (ref 1.7–7.7)
Neutrophils Relative %: 44 %
Platelets: 200 K/uL (ref 150–400)
RBC: 4.26 MIL/uL (ref 4.22–5.81)
RDW: 14 % (ref 11.5–15.5)
WBC: 5.7 K/uL (ref 4.0–10.5)
nRBC: 1.9 % — ABNORMAL HIGH (ref 0.0–0.2)

## 2024-05-28 LAB — COMPREHENSIVE METABOLIC PANEL WITH GFR
ALT: 22 U/L (ref 0–44)
AST: 37 U/L (ref 15–41)
Albumin: 4.3 g/dL (ref 3.5–5.0)
Alkaline Phosphatase: 97 U/L (ref 38–126)
Anion gap: 10 (ref 5–15)
BUN: 10 mg/dL (ref 6–20)
CO2: 25 mmol/L (ref 22–32)
Calcium: 8.9 mg/dL (ref 8.9–10.3)
Chloride: 108 mmol/L (ref 98–111)
Creatinine, Ser: 0.87 mg/dL (ref 0.61–1.24)
GFR, Estimated: >60 mL/min (ref >60)
Glucose, Bld: 104 mg/dL — ABNORMAL HIGH (ref 70–99)
Potassium: 4.2 mmol/L (ref 3.5–5.1)
Sodium: 142 mmol/L (ref 135–145)
Total Bilirubin: 1.3 mg/dL — ABNORMAL HIGH (ref 0.0–1.2)
Total Protein: 6.9 g/dL (ref 6.5–8.1)

## 2024-05-28 LAB — RETICULOCYTES
Immature Retic Fract: 34.5 % — ABNORMAL HIGH (ref 2.3–15.9)
RBC.: 4.26 MIL/uL (ref 4.22–5.81)
Retic Count, Absolute: 157.2 K/uL (ref 19.0–186.0)
Retic Ct Pct: 3.7 % — ABNORMAL HIGH (ref 0.4–3.1)

## 2024-05-28 MED ORDER — HYDROMORPHONE HCL 1 MG/ML IJ SOLN
1.0000 mg | INTRAMUSCULAR | Status: AC
  Administered 2024-05-28: 10:00:00 1 mg via INTRAVENOUS
  Filled 2024-05-28: qty 1

## 2024-05-28 MED ORDER — HYDROMORPHONE HCL 1 MG/ML IJ SOLN
2.0000 mg | INTRAMUSCULAR | Status: AC
  Administered 2024-05-28: 08:00:00 2 mg via INTRAVENOUS
  Filled 2024-05-28: qty 2

## 2024-05-28 MED ORDER — DIPHENHYDRAMINE HCL 25 MG PO CAPS
25.0000 mg | ORAL_CAPSULE | ORAL | Status: DC | PRN
  Filled 2024-05-28: qty 1

## 2024-05-28 MED ORDER — HYDROMORPHONE HCL 1 MG/ML IJ SOLN
1.0000 mg | INTRAMUSCULAR | Status: AC
  Administered 2024-05-28: 09:00:00 1 mg via INTRAVENOUS
  Filled 2024-05-28: qty 1

## 2024-05-28 MED ORDER — ONDANSETRON HCL 4 MG/2ML IJ SOLN
4.0000 mg | INTRAMUSCULAR | Status: DC | PRN
  Administered 2024-05-28: 08:00:00 4 mg via INTRAVENOUS
  Filled 2024-05-28: qty 2

## 2024-05-28 NOTE — ED Triage Notes (Addendum)
 Woke up with sickle cell crisis and bilateral lower extremities.  Takes ibuprofen  800mg  and oxycodone  10mg , states is completely out of both.

## 2024-05-28 NOTE — ED Provider Notes (Signed)
 [place] [facility] Provider Note   CSN: [id number] Arrival date & time: 05/28/24  9242     Patient presents with: Sickle Cell Pain Crisis   William Jennings is a 33 y.o. male.   33 yo M with a chief complaints of a sickle cell pain crisis.  Thinks it started about 4 hours ago.  Bilateral legs.  Feels like his typical sickle cell crisis.  Denies any new areas of pain.  Denies chest pain denies fevers denies cough.  He thinks the weather is the cause of his symptoms.   Sickle Cell Pain Crisis      Prior to Admission medications  Medication Sig Start Date End Date Taking? Authorizing Provider  albuterol  (VENTOLIN  HFA) 108 (90 Base) MCG/ACT inhaler Inhale 2 puffs into the lungs every 6 (six) hours as needed for wheezing or shortness of breath. Patient not taking: Reported on 08/03/2022 11/18/21   Oley Bascom RAMAN, NP  ergocalciferol  (VITAMIN D2) 1.25 MG (50000 UT) capsule Take 1 capsule (50,000 Units total) by mouth once a week. 03/04/23   Tilford Bertram HERO, FNP  folic acid  (FOLVITE ) 1 MG tablet Take 1 tablet (1 mg total) by mouth daily. 12/07/22   Tilford Bertram HERO, FNP  ibuprofen  (ADVIL ) 800 MG tablet Take 1 tablet (800 mg total) by mouth every 6 (six) hours as needed for moderate pain (pain score 4-6). 11/21/23   Paseda, Folashade R, FNP  Oxycodone  HCl 10 MG TABS Take 1 tablet (10 mg total) by mouth every 6 (six) hours as needed. 12/07/23   Jegede, Olugbemiga E, MD    Allergies: Shellfish allergy    Review of Systems  Updated Vital Signs BP 106/67   Pulse 81   Temp 97.6 F (36.4 C) (Oral)   Resp 20   Ht 5' 8 (1.727 m)   Wt 68 kg   SpO2 98%   BMI 22.81 kg/m   Physical Exam Vitals and nursing note reviewed.  Constitutional:      Appearance: He is well-developed.  HENT:     Head: Normocephalic and atraumatic.  Eyes:     Pupils: Pupils are equal, round, and reactive to light.  Neck:     Vascular: No JVD.  Cardiovascular:     Rate  and Rhythm: Normal rate and regular rhythm.     Heart sounds: No murmur heard.    No friction rub. No gallop.  Pulmonary:     Effort: No respiratory distress.     Breath sounds: No wheezing.  Abdominal:     General: There is no distension.     Tenderness: There is no abdominal tenderness. There is no guarding or rebound.  Musculoskeletal:        General: Normal range of motion.     Cervical back: Normal range of motion and neck supple.  Skin:    Coloration: Skin is not pale.     Findings: No rash.  Neurological:     Mental Status: He is alert and oriented to person, place, and time.  Psychiatric:        Behavior: Behavior normal.     (all labs ordered are listed, but only abnormal results are displayed) Labs Reviewed  CBC WITH DIFFERENTIAL/PLATELET - Abnormal; Notable for the following components:      Result Value   Hemoglobin 12.1 (*)    HCT 33.2 (*)    MCV 77.9 (*)    MCHC 36.4 (*)    nRBC 1.9 (*)  All other components within normal limits  RETICULOCYTES - Abnormal; Notable for the following components:   Retic Ct Pct 3.7 (*)    Immature Retic Fract 34.5 (*)    All other components within normal limits  COMPREHENSIVE METABOLIC PANEL WITH GFR - Abnormal; Notable for the following components:   Glucose, Bld 104 (*)    Total Bilirubin 1.3 (*)    All other components within normal limits    EKG: None  Radiology: No results found.   Procedures   Medications Ordered in the ED  diphenhydrAMINE  (BENADRYL ) capsule 25-50 mg (has no administration in time range)  ondansetron  (ZOFRAN ) injection 4 mg (4 mg Intravenous Given 05/28/24 0827)  HYDROmorphone  (DILAUDID ) injection 2 mg (2 mg Intravenous Given 05/28/24 0827)  HYDROmorphone  (DILAUDID ) injection 1 mg (1 mg Intravenous Given 05/28/24 0917)  HYDROmorphone  (DILAUDID ) injection 1 mg (1 mg Intravenous Given 05/28/24 0955)                                    Medical Decision Making Amount and/or Complexity of  Data Reviewed Labs: ordered.  Risk Prescription drug management.   33 yo M with a chief complaint of a sickle cell pain crisis.  Started about 4 hours ago.  Out of his home medications.  Will treat aggressively.  Reassess.  No acute anemia no significant electrolyte abnormalities.  Patient feeling better after 3 doses of IV narcotics.  Will discharge home.  PCP follow-up.  10:53 AM:  I have discussed the diagnosis/risks/treatment options with the patient.  Evaluation and diagnostic testing in the emergency department does not suggest an emergent condition requiring admission or immediate intervention beyond what has been performed at this time.  They will follow up with PCP. We also discussed returning to the ED immediately if new or worsening sx occur. We discussed the sx which are most concerning (e.g., sudden worsening pain, fever, inability to tolerate by mouth) that necessitate immediate return. Medications administered to the patient during their visit and any new prescriptions provided to the patient are listed below.  Medications given during this visit Medications  diphenhydrAMINE  (BENADRYL ) capsule 25-50 mg (has no administration in time range)  ondansetron  (ZOFRAN ) injection 4 mg (4 mg Intravenous Given 05/28/24 0827)  HYDROmorphone  (DILAUDID ) injection 2 mg (2 mg Intravenous Given 05/28/24 0827)  HYDROmorphone  (DILAUDID ) injection 1 mg (1 mg Intravenous Given 05/28/24 0917)  HYDROmorphone  (DILAUDID ) injection 1 mg (1 mg Intravenous Given 05/28/24 0955)     The patient appears reasonably screen and/or stabilized for discharge and I doubt any other medical condition or other [facility] requiring further screening, evaluation, or treatment in the ED at this time prior to discharge.       Final diagnoses:  Sickle cell pain crisis [facility])    ED Discharge Orders     None          Emil Share, DO 05/28/24 1053

## 2024-05-28 NOTE — Discharge Instructions (Signed)
 Follow up with your sickle cell clinic.  Return for worsening pain.

## 2024-05-31 ENCOUNTER — Other Ambulatory Visit: Payer: Self-pay | Admitting: Family Medicine

## 2024-05-31 DIAGNOSIS — D5700 Hb-SS disease with crisis, unspecified: Secondary | ICD-10-CM

## 2024-05-31 NOTE — Telephone Encounter (Unsigned)
 Copied from CRM #[id number]. Topic: Clinical - Medication Refill >> May 31, 2024  8:42 AM Suzen RAMAN wrote: Medication: Oxycodone  HCl 10 MG TABS   Has the patient contacted their pharmacy? Yes   This is the patient's preferred pharmacy:  [id number] [address], [place] - [address] [place] [address] Phone: [phone] Fax: [phone]  Is this the correct pharmacy for this prescription? Yes If no, delete pharmacy and type the correct one.   Has the prescription been filled recently? No  Is the patient out of the medication? Yes  Has the patient been seen for an appointment in the last year OR does the patient have an upcoming appointment? Yes  Can we respond through MyChart? Yes  Agent: Please be advised that Rx refills may take up to 3 business days. We ask that you follow-up with your pharmacy.

## 2024-06-01 NOTE — Telephone Encounter (Signed)
 Caller & Relationship to patient:   MRN #  [id number]   Call Back Number:   Date of Last Office Visit: 05/31/2024     Date of Next Office Visit: 07/23/2024    Medication(s) to be Refilled: Oxycodone   Preferred Pharmacy: [id number] [address], [place] - [address] [place] [address] Phone: [phone] Fax: [phone]  ** Please notify patient to allow 48-72 hours to process** **Let patient know to contact pharmacy at the end of the day to make sure medication is ready. ** **If patient has not been seen in a year or longer, book an appointment **Advise to use MyChart for refill requests OR to contact their pharmacy

## 2024-06-11 ENCOUNTER — Inpatient Hospital Stay: Payer: Self-pay | Admitting: Nurse Practitioner

## 2024-06-25 ENCOUNTER — Ambulatory Visit: Payer: Self-pay | Admitting: Nurse Practitioner

## 2024-06-25 NOTE — Telephone Encounter (Signed)
 FYI Only or Action Required?: Action required by provider: medication refill request, clinical question for provider, and update on patient condition.  Patient was last seen in primary care on 02/22/2023 by Tilford Bertram HERO, FNP.  Called Nurse Triage reporting Pain.  Symptoms began several days ago.  Interventions attempted: Prescription medications: ibuprofen  800mg .  Symptoms are: gradually worsening.  Triage Disposition: Call PCP Within 24 Hours  Patient/caregiver understands and will follow disposition?: No, wishes to speak with PCP      Copied from CRM #[id number]. Topic: Clinical - Red Word Triage >> Jun 25, 2024  5:13 PM China J wrote: [facility] that prompted transfer to Nurse Triage: Patient is having extreme pain in his left leg due to being without oxycodone . Patient said that pain started 48 hours ago. Reason for Disposition  [1] MODERATE sickle cell pain AND [2] does NOT have a pain management plan AND [3] NOT better after taking OTC acetaminophen  (Tylenol ) or ibuprofen  (Motrin )  Answer Assessment - Initial Assessment Questions Pt states he called in December and has not ever received a call back. He states this is his second crisis since then. He states he is taking his ibuprofen  800mg . He would rather not go back to the ER because he doesn't want any dilaudid . He states he is having left leg pain. He states last crisis was both legs. He states it is his upper knee cap and into his left thigh. RN did give info on next appt and read messages to him. He states he was seeing Dr. Jegede. Rn advised if pain continues and not able to see him any sooner to go to the ER for relief. Pt stated understanding.     1. ONSET: When did the pain begin?      About 2 days ago 2. LOCATION: Where does it hurt?      Left knee and thigh 3. SEVERITY: How bad is the pain?  (e.g., Scale 1-10; mild, moderate, or severe)     Moderate to severe 4. PATTERN: Is the pain constant? (e.g., yes,  no; constant, intermittent)      yes 5. CAUSE:  What do you think is causing the pain? Is this pain similar to the acute pain attacks you have had in the past? (e.g., similar location, quality, intensity)     Sickle cell 6. PAIN MEDICINES:      Only taking ibuprofen  7. OTHER SYMPTOMS: Do you have any other symptoms? (e.g., fever, chest pain, shortness of breath, new areas of swelling or redness)     Denies any other symptoms  Protocols used: Sickle Cell Disease - Acute Pain Episode (Crisis)-A-AH

## 2024-06-26 NOTE — Telephone Encounter (Signed)
 Pt was an appt 06/27/2024. KH

## 2024-06-27 ENCOUNTER — Ambulatory Visit (INDEPENDENT_AMBULATORY_CARE_PROVIDER_SITE_OTHER): Payer: Self-pay | Admitting: Nurse Practitioner

## 2024-06-27 ENCOUNTER — Encounter: Payer: Self-pay | Admitting: Nurse Practitioner

## 2024-06-27 VITALS — BP 104/78 | HR 88 | Wt 159.0 lb

## 2024-06-27 DIAGNOSIS — D571 Sickle-cell disease without crisis: Secondary | ICD-10-CM | POA: Diagnosis not present

## 2024-06-27 DIAGNOSIS — Z0289 Encounter for other administrative examinations: Secondary | ICD-10-CM | POA: Diagnosis not present

## 2024-06-27 DIAGNOSIS — E559 Vitamin D deficiency, unspecified: Secondary | ICD-10-CM | POA: Diagnosis not present

## 2024-06-27 DIAGNOSIS — H3689 Other retinal disorders in diseases classified elsewhere: Secondary | ICD-10-CM | POA: Insufficient documentation

## 2024-06-27 DIAGNOSIS — G894 Chronic pain syndrome: Secondary | ICD-10-CM

## 2024-06-27 DIAGNOSIS — G44209 Tension-type headache, unspecified, not intractable: Secondary | ICD-10-CM | POA: Diagnosis not present

## 2024-06-27 DIAGNOSIS — D5720 Sickle-cell/Hb-C disease without crisis: Secondary | ICD-10-CM | POA: Insufficient documentation

## 2024-06-27 DIAGNOSIS — F17200 Nicotine dependence, unspecified, uncomplicated: Secondary | ICD-10-CM | POA: Insufficient documentation

## 2024-06-27 LAB — POCT URINALYSIS DIP (CLINITEK)
Bilirubin, UA: NEGATIVE
Blood, UA: NEGATIVE
Glucose, UA: NEGATIVE mg/dL
Ketones, POC UA: NEGATIVE mg/dL
Leukocytes, UA: NEGATIVE
Nitrite, UA: NEGATIVE
POC PROTEIN,UA: NEGATIVE
Spec Grav, UA: 1.015
Urobilinogen, UA: 1 U/dL
pH, UA: 6.5

## 2024-06-27 MED ORDER — ERGOCALCIFEROL 1.25 MG (50000 UT) PO CAPS: 50000.0000 [IU] | ORAL_CAPSULE | ORAL | 3 refills | Status: AC

## 2024-06-27 MED ORDER — FOLIC ACID 1 MG PO TABS: 1.0000 mg | ORAL_TABLET | Freq: Every day | ORAL | 1 refills | Status: AC

## 2024-06-27 MED ORDER — OXYCODONE HCL 10 MG PO TABS: 10.0000 mg | ORAL_TABLET | Freq: Four times a day (QID) | ORAL | 0 refills | Status: AC | PRN

## 2024-06-27 MED ORDER — IBUPROFEN 800 MG PO TABS: 800.0000 mg | ORAL_TABLET | Freq: Four times a day (QID) | ORAL | 5 refills | Status: AC | PRN

## 2024-06-27 NOTE — Assessment & Plan Note (Signed)
 Chronic pain syndrome Chronic pain managed with ibuprofen  and oxycodone . Recent exacerbation due to pain crisis. - Continue current pain management regimen with ibuprofen  and oxycodone  as needed. - Monitor for excessive sedation and report if it occurs. - Encouraged hydration and increased intake of vegetables to prevent constipation.

## 2024-06-27 NOTE — Assessment & Plan Note (Signed)
" °  Current smoker with occasional vaping. Discussed negative impact on heart and lung health. - Encouraged smoking cessation and discussed potential benefits of quitting.  "

## 2024-06-27 NOTE — Assessment & Plan Note (Signed)
 Tension-type headache New onset headaches possibly related to stress from recent job change, affecting concentration. - Encouraged stress management techniques and adequate rest. - Recommended maintaining hydration with at least 64 ounces of water daily. - Advised ensuring 7-8 hours of sleep nightly. On ibuprofen  800 mg 3 times daily as needed

## 2024-06-27 NOTE — Assessment & Plan Note (Signed)
 We discussed the need for good hydration, monitoring of hydration status, avoidance of heat, cold, stress, and infection triggers. We discussed the need to be adherent with taking folic acid  and other home medications. Patient was reminded of the need to seek medical attention immediately if any symptom of bleeding, anemia, or infection occurs.   Continue ibuprofen  800 mg 3 times daily as needed mild pain, oxycodone  1 tablet every 6 hours as needed for moderate pain I reminded the patient that all patients receiving Schedule II narcotics must be seen for follow within the 3 months in the office.  We reviewed the terms of our pain agreement, including the need to keep medicines in a safe locked location away from children or pets, and the need to report excess sedation or constipation, measures to avoid constipation, and policies related to early refills and stolen prescriptions. According to the [place] [employer] program, we have reviewed details related to analgesia, adverse effects, aberrant behaviors.     Up to date on tetanus vaccine. Declines flu vaccine due to adverse reactions. Discussed importance of regular eye exams and maintaining up-to-date vaccinations. - Encouraged regular eye exams to screen for retinopathy. - Discussed pneumonia and hepatitis B vaccines, will follow up on this at next visit

## 2024-06-27 NOTE — Assessment & Plan Note (Signed)
 Pain management contract signed today

## 2024-06-27 NOTE — Assessment & Plan Note (Deleted)
 We discussed the need for good hydration, monitoring of hydration status, avoidance of heat, cold, stress, and infection triggers. We discussed the need to be adherent with taking folic acid  and other home medications. Patient was reminded of the need to seek medical attention immediately if any symptom of bleeding, anemia, or infection occurs.   Continue ibuprofen  800 mg 3 times daily as needed mild pain, oxycodone  1 tablet every 6 hours as needed for moderate pain I reminded the patient that all patients receiving Schedule II narcotics must be seen for follow within the 3 months in the office.  We reviewed the terms of our pain agreement, including the need to keep medicines in a safe locked location away from children or pets, and the need to report excess sedation or constipation, measures to avoid constipation, and policies related to early refills and stolen prescriptions. According to the [place] [employer] program, we have reviewed details related to analgesia, adverse effects, aberrant behaviors.     Up to date on tetanus vaccine. Declines flu vaccine due to adverse reactions. Discussed importance of regular eye exams and maintaining up-to-date vaccinations. - Encouraged regular eye exams to screen for retinopathy. - Discussed pneumonia and hepatitis B vaccines, will follow up on this at next visit

## 2024-06-27 NOTE — Assessment & Plan Note (Signed)
 Last vitamin D  Lab Results  Component Value Date   VD25OH 12.7 (L) 02/22/2023   Vitamin D  level to be checked to determine need for supplementation. - Ordered vitamin D  level to assess need for supplementation.

## 2024-06-27 NOTE — Addendum Note (Signed)
 Addended by: VICTORY IHA on: 06/27/2024 04:33 PM   Modules accepted: Orders

## 2024-06-27 NOTE — Patient Instructions (Signed)
 1. Chronic pain syndrome (Primary) - ToxAssure Flex 15, Ur  2. Vitamin D  deficiency - Sickle Cell Panel - ergocalciferol  (VITAMIN D2) 1.25 MG (50000 UT) capsule; Take 1 capsule (50,000 Units total) by mouth once a week.  Dispense: 12 capsule; Refill: 3  3. Sickle cell pain crisis (HCC)  4. Sickle cell-hemoglobin C disease without crisis (HCC) - ToxAssure Flex 15, Ur - Sickle Cell Panel - POCT URINALYSIS DIP (CLINITEK) - Oxycodone  HCl 10 MG TABS; Take 1 tablet (10 mg total) by mouth every 6 (six) hours as needed.  Dispense: 60 tablet; Refill: 0 - ibuprofen  (ADVIL ) 800 MG tablet; Take 1 tablet (800 mg total) by mouth every 6 (six) hours as needed for moderate pain (pain score 4-6).  Dispense: 30 tablet; Refill: 5 - folic acid  (FOLVITE ) 1 MG tablet; Take 1 tablet (1 mg total) by mouth daily.  Dispense: 90 tablet; Refill: 1    It is important that you exercise regularly at least 30 minutes 5 times a week as tolerated  Think about what you will eat, plan ahead. Choose  clean, green, fresh or frozen over canned, processed or packaged foods which are more sugary, salty and fatty. 70 to 75% of food eaten should be vegetables and fruit. Three meals at set times with snacks allowed between meals, but they must be fruit or vegetables. Aim to eat over a 12 hour period , example 7 am to 7 pm, and STOP after  your last meal of the day. Drink water,generally about 64 ounces per day, no other drink is as healthy. Fruit juice is best enjoyed in a healthy way, by EATING the fruit.  Thanks for choosing [facility] we consider it a privelige to serve you.

## 2024-06-27 NOTE — Progress Notes (Signed)
 "  New Patient Office Visit  Subjective:  Patient ID: William Jennings, male    DOB: [birth year]  Age: 34 y.o. MRN: [id number]  CC:  Chief Complaint  Patient presents with   Sickle Cell Anemia    HPI .  Discussed the use of AI scribe software for clinical note transcription with the patient, who gave verbal consent to proceed.  History of Present Illness William Jennings is a 34 year old male  has a past medical history of Chronic prescription opiate use, Sickle cell anemia (HCC), and Sickle cell disease (HCC).  Patient presents to establish care for his chronic medical conditions.  Previous PCP Bertram Bald NP     He has been experiencing multiple pain crises over the past month, primarily affecting his legs. He ran out of his medication and visited the emergency room for treatment, although he prefers to manage his condition at home. He uses ibuprofen  800 mg every six hours as needed for mild pain and oxycodone  10 mg every six hours as needed for moderate to severe pain. During his last two crises, he quickly exhausted his supply of ibuprofen . He has not been hospitalized in the past year but has had two emergency room visits.  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 The headaches are intense, affecting the back and front of his head, and are sometimes so severe that he cannot think straight. He feels overstimulated and needs to unwind by turning off the TV. These headaches occur intermittently and are not present every day.  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 He recently saw an eye doctor on December 10th, who provided a new prescription for glasses and confirmed that everything looked good with his eyes.  He is currently not taking folic acid  or vitamin D  and requires refills for  these medications. He previously used hydroxyurea  but discontinued it due to increased frequency of crises.  He smokes 'blacks' and occasionally vapes. He lives alone and has two daughters. He works long hours, sometimes up to thirteen days straight, which he believes contributes to his stress and headaches.  He feels more tired than usual.  No fever, chills, chest pain, shortness of breath  Assessment & Plan        Past Medical History:  Diagnosis Date   Chronic prescription opiate use    Sickle cell anemia (HCC)    Sickle cell disease (HCC)     Past Surgical History:  Procedure Laterality Date   CHOLECYSTECTOMY      Family History  Problem Relation Age of Onset   Diabetes Mother    Sickle cell trait Mother    Sickle cell trait Father    Sickle cell anemia Sister     Social History   Socioeconomic History   Marital status: Single    Spouse name: Not on file   Number of children: 2   Years of education: Not on file   Highest education level: Some college, no degree  Occupational History   Not on file  Tobacco Use   Smoking status: Some Days    Types: Cigarettes   Smokeless tobacco: Never  Vaping Use   Vaping status: Never Used  Substance and Sexual Activity   Alcohol use: Yes  Comment: occ   Drug use: No   Sexual activity: Not on file  Other Topics Concern   Not on file  Social History Narrative   Lives home alone    Social Drivers of Health   Tobacco Use: High Risk (06/27/2024)   Patient History    Smoking Tobacco Use: Some Days    Smokeless Tobacco Use: Never    Passive Exposure: Not on file  Financial Resource Strain: Medium Risk (12/10/2022)   Overall Financial Resource Strain (CARDIA)    Difficulty of Paying Living Expenses: Somewhat hard  Food Insecurity: No Food Insecurity (06/27/2024)   Epic    Worried About Programme Researcher, Broadcasting/film/video in the Last Year: Never true    Ran Out of Food in the Last Year: Never true  Transportation Needs: No  Transportation Needs (06/27/2024)   Epic    Lack of Transportation (Medical): No    Lack of Transportation (Non-Medical): No  Physical Activity: Insufficiently Active (12/10/2022)   Exercise Vital Sign    Days of Exercise per Week: 2 days    Minutes of Exercise per Session: 20 min  Stress: No Stress Concern Present (12/10/2022)   [employer] of Occupational Health - Occupational Stress Questionnaire    Feeling of Stress : Only a little  Social Connections: Moderately Isolated (12/10/2022)   Social Connection and Isolation Panel    Frequency of Communication with Friends and Family: Twice a week    Frequency of Social Gatherings with Friends and Family: Once a week    Attends Religious Services: More than 4 times per year    Active Member of [employer] or Organizations: No    Attends Engineer, Structural: Not on file    Marital Status: Never married  Intimate Partner Violence: Not At Risk (06/27/2024)   Epic    Fear of Current or Ex-Partner: No    Emotionally Abused: No    Physically Abused: No    Sexually Abused: No  Depression (PHQ2-9): Low Risk (06/27/2024)   Depression (PHQ2-9)    PHQ-2 Score: 0  Alcohol Screen: Not on file  Housing: Low Risk (06/27/2024)   Epic    Unable to Pay for Housing in the Last Year: No    Number of Times Moved in the Last Year: 0    Homeless in the Last Year: No  Utilities: Not At Risk (06/27/2024)   Epic    Threatened with loss of utilities: No  Health Literacy: Not on file    ROS Review of Systems  Constitutional:  Negative for appetite change, chills, fatigue and fever.  HENT:  Negative for congestion, postnasal drip, rhinorrhea and sneezing.   Respiratory:  Negative for cough, shortness of breath and wheezing.   Cardiovascular:  Negative for chest pain, palpitations and leg swelling.  Gastrointestinal:  Negative for abdominal pain, constipation, nausea and vomiting.  Genitourinary:  Negative for difficulty urinating, dysuria, flank  pain and frequency.  Musculoskeletal:  Positive for arthralgias. Negative for back pain, joint swelling and myalgias.  Skin:  Negative for color change, pallor, rash and wound.  Neurological:  Negative for dizziness, facial asymmetry, weakness, numbness and headaches.  Psychiatric/Behavioral:  Negative for behavioral problems, confusion, self-injury and suicidal ideas.     Objective:   Today's Vitals: BP 104/78   Pulse 88   Wt 159 lb (72.1 kg)   SpO2 100%   BMI 24.18 kg/m   Physical Exam Vitals and nursing note reviewed.  Constitutional:  General: He is not in acute distress.    Appearance: Normal appearance. He is not ill-appearing, toxic-appearing or diaphoretic.  HENT:     Mouth/Throat:     Mouth: Mucous membranes are moist.     Pharynx: No oropharyngeal exudate or posterior oropharyngeal erythema.  Eyes:     General: Scleral icterus present.        Right eye: No discharge.        Left eye: No discharge.     Extraocular Movements: Extraocular movements intact.  Cardiovascular:     Rate and Rhythm: Normal rate and regular rhythm.     Pulses: Normal pulses.     Heart sounds: Normal heart sounds. No murmur heard.    No friction rub. No gallop.  Pulmonary:     Effort: Pulmonary effort is normal. No respiratory distress.     Breath sounds: Normal breath sounds. No stridor. No wheezing, rhonchi or rales.  Chest:     Chest wall: No tenderness.  Abdominal:     General: There is no distension.     Palpations: Abdomen is soft.     Tenderness: There is no abdominal tenderness. There is no right CVA tenderness, left CVA tenderness or guarding.  Musculoskeletal:        General: No swelling, tenderness, deformity or signs of injury.     Right lower leg: No edema.     Left lower leg: No edema.  Skin:    General: Skin is warm and dry.     Capillary Refill: Capillary refill takes less than 2 seconds.     Coloration: Skin is not jaundiced or pale.     Findings: No bruising,  erythema or lesion.  Neurological:     Mental Status: He is alert and oriented to person, place, and time.     Motor: No weakness.     Coordination: Coordination normal.     Gait: Gait normal.  Psychiatric:        Mood and Affect: Mood normal.        Behavior: Behavior normal.        Thought Content: Thought content normal.        Judgment: Judgment normal.     Assessment & Plan:   Problem List Items Addressed This Visit       Other   Chronic pain syndrome - Primary   Chronic pain syndrome Chronic pain managed with ibuprofen  and oxycodone . Recent exacerbation due to pain crisis. - Continue current pain management regimen with ibuprofen  and oxycodone  as needed. - Monitor for excessive sedation and report if it occurs. - Encouraged hydration and increased intake of vegetables to prevent constipation.       Relevant Medications   Oxycodone  HCl 10 MG TABS   ibuprofen  (ADVIL ) 800 MG tablet   Other Relevant Orders   ToxAssure Flex 15, Ur   Vitamin D  deficiency   Last vitamin D  Lab Results  Component Value Date   VD25OH 12.7 (L) 02/22/2023   Vitamin D  level to be checked to determine need for supplementation. - Ordered vitamin D  level to assess need for supplementation.       Relevant Medications   ergocalciferol  (VITAMIN D2) 1.25 MG (50000 UT) capsule   Other Relevant Orders   Sickle Cell Panel   Pain management contract signed   Pain management contract signed today      Tension headache   Tension-type headache New onset headaches possibly related to stress from recent job change, affecting concentration. -  Encouraged stress management techniques and adequate rest. - Recommended maintaining hydration with at least 64 ounces of water daily. - Advised ensuring 7-8 hours of sleep nightly. On ibuprofen  800 mg 3 times daily as needed        Relevant Medications   Oxycodone  HCl 10 MG TABS   ibuprofen  (ADVIL ) 800 MG tablet   Tobacco use disorder    Current  smoker with occasional vaping. Discussed negative impact on heart and lung health. - Encouraged smoking cessation and discussed potential benefits of quitting.       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 We discussed the need to be adherent with taking folic acid  and other home medications. Patient was reminded of the need to seek medical attention immediately if any symptom of bleeding, anemia, or infection occurs.   Continue ibuprofen  800 mg 3 times daily as needed mild pain, oxycodone  1 tablet every 6 hours as needed for moderate pain I reminded the patient that all patients receiving Schedule II narcotics must be seen for follow within the 3 months in the office.  We reviewed the terms of our pain agreement, including the need to keep medicines in a safe locked location away from children or pets, and the need to report excess sedation or constipation, measures to avoid constipation, and policies related to early refills and stolen prescriptions. According to the [place] [facility] program, we have reviewed details related to analgesia, adverse effects, aberrant behaviors.     Up to date on tetanus vaccine. Declines flu vaccine due to adverse reactions. Discussed importance of regular eye exams and maintaining up-to-date vaccinations. - Encouraged regular eye exams to screen for retinopathy. - Discussed pneumonia and hepatitis B vaccines, will follow up on this at next visit       Relevant Medications   Oxycodone  HCl 10 MG TABS   ibuprofen  (ADVIL ) 800 MG tablet   folic acid  (FOLVITE ) 1 MG tablet   Other Relevant Orders   ToxAssure Flex 15, Ur   Sickle Cell Panel   POCT URINALYSIS DIP (CLINITEK)    Outpatient Encounter Medications as of 06/27/2024  Medication Sig   [DISCONTINUED] ibuprofen  (ADVIL ) 800 MG tablet  Take 1 tablet (800 mg total) by mouth every 6 (six) hours as needed for moderate pain (pain score 4-6).   [DISCONTINUED] Oxycodone  HCl 10 MG TABS Take 1 tablet (10 mg total) by mouth every 6 (six) hours as needed.   albuterol  (VENTOLIN  HFA) 108 (90 Base) MCG/ACT inhaler Inhale 2 puffs into the lungs every 6 (six) hours as needed for wheezing or shortness of breath. (Patient not taking: Reported on 06/27/2024)   ergocalciferol  (VITAMIN D2) 1.25 MG (50000 UT) capsule Take 1 capsule (50,000 Units total) by mouth once a week.   folic acid  (FOLVITE ) 1 MG tablet Take 1 tablet (1 mg total) by mouth daily.   ibuprofen  (ADVIL ) 800 MG tablet Take 1 tablet (800 mg total) by mouth every 6 (six) hours as needed for moderate pain (pain score 4-6).   Oxycodone  HCl 10 MG TABS Take 1 tablet (10 mg total) by mouth every 6 (six) hours as needed.   [DISCONTINUED] ergocalciferol  (VITAMIN D2) 1.25 MG (50000 UT) capsule Take 1 capsule (50,000 Units total) by mouth once a week. (Patient not taking: Reported on 06/27/2024)   [DISCONTINUED] folic acid  (FOLVITE ) 1 MG tablet Take  1 tablet (1 mg total) by mouth daily. (Patient not taking: Reported on 06/27/2024)   No facility-administered encounter medications on file as of 06/27/2024.    Follow-up: Return in about 3 months (around 09/25/2024) for CPE.   Jullien Granquist R Brent Noto, FNP "

## 2024-06-28 LAB — CMP14+CBC/D/PLT+FER+RETIC+V...
ALT: 18 IU/L (ref 0–44)
AST: 30 IU/L (ref 0–40)
Albumin: 4.5 g/dL (ref 4.1–5.1)
Alkaline Phosphatase: 111 IU/L (ref 47–123)
BUN/Creatinine Ratio: 9 (ref 9–20)
BUN: 8 mg/dL (ref 6–20)
Basophils Absolute: 0 x10E3/uL (ref 0.0–0.2)
Basos: 1 %
Bilirubin Total: 1.5 mg/dL — ABNORMAL HIGH (ref 0.0–1.2)
CO2: 22 mmol/L (ref 20–29)
Calcium: 9.1 mg/dL (ref 8.7–10.2)
Chloride: 105 mmol/L (ref 96–106)
Creatinine, Ser: 0.88 mg/dL (ref 0.76–1.27)
EOS (ABSOLUTE): 0 x10E3/uL (ref 0.0–0.4)
Eos: 0 %
Ferritin: 300 ng/mL (ref 30–400)
Globulin, Total: 2.8 g/dL (ref 1.5–4.5)
Glucose: 86 mg/dL (ref 70–99)
Hematocrit: 40.1 % (ref 37.5–51.0)
Hemoglobin: 12.9 g/dL — ABNORMAL LOW (ref 13.0–17.7)
Immature Grans (Abs): 0 x10E3/uL (ref 0.0–0.1)
Immature Granulocytes: 0 %
Lymphocytes Absolute: 2.1 x10E3/uL (ref 0.7–3.1)
Lymphs: 45 %
MCH: 28.5 pg (ref 26.6–33.0)
MCHC: 32.2 g/dL (ref 31.5–35.7)
MCV: 89 fL (ref 79–97)
Monocytes Absolute: 0.6 x10E3/uL (ref 0.1–0.9)
Monocytes: 14 %
NRBC: 2 % — ABNORMAL HIGH (ref 0–0)
Neutrophils Absolute: 1.7 x10E3/uL (ref 1.4–7.0)
Neutrophils: 39 %
Platelets: 192 x10E3/uL (ref 150–450)
Potassium: 4.8 mmol/L (ref 3.5–5.2)
RBC: 4.52 x10E6/uL (ref 4.14–5.80)
RDW: 14 % (ref 11.6–15.4)
Retic Ct Pct: 2.7 % — ABNORMAL HIGH (ref 0.6–2.6)
Sodium: 140 mmol/L (ref 134–144)
Total Protein: 7.3 g/dL (ref 6.0–8.5)
Vit D, 25-Hydroxy: 5.2 ng/mL — ABNORMAL LOW (ref 30.0–100.0)
WBC: 4.4 x10E3/uL (ref 3.4–10.8)
eGFR: 115 mL/min/1.73

## 2024-06-29 ENCOUNTER — Ambulatory Visit: Payer: Self-pay | Admitting: Nurse Practitioner

## 2024-06-29 NOTE — Telephone Encounter (Signed)
 Copied from CRM #[id number]. Topic: Clinical - Lab/Test Results >> Jun 29, 2024 11:47 AM Thersia BROCKS wrote: Reason for CRM: Patient called in regarding missed call, relayed lab results patient stated he understood and have no further questions

## 2024-07-02 LAB — TOXASSURE FLEX 15, UR
6-ACETYLMORPHINE IA: NEGATIVE ng/mL
7-aminoclonazepam: NOT DETECTED ng/mg{creat}
AMPHETAMINES IA: NEGATIVE ng/mL
Alpha-hydroxyalprazolam: NOT DETECTED ng/mg{creat}
Alpha-hydroxymidazolam: NOT DETECTED ng/mg{creat}
Alpha-hydroxytriazolam: NOT DETECTED ng/mg{creat}
Alprazolam: NOT DETECTED ng/mg{creat}
BARBITURATES IA: NEGATIVE ng/mL
Buprenorphine: NOT DETECTED ng/mg{creat}
COCAINE METABOLITE IA: NEGATIVE ng/mL
Clonazepam: NOT DETECTED ng/mg{creat}
Creatinine: 143 mg/dL
Desalkylflurazepam: NOT DETECTED ng/mg{creat}
Desmethyldiazepam: NOT DETECTED ng/mg{creat}
Desmethylflunitrazepam: NOT DETECTED ng/mg{creat}
Diazepam: NOT DETECTED ng/mg{creat}
ETHYL ALCOHOL Enzymatic: NEGATIVE g/dL
Fentanyl: NOT DETECTED ng/mg{creat}
Flunitrazepam: NOT DETECTED ng/mg{creat}
Lorazepam: NOT DETECTED ng/mg{creat}
METHADONE IA: NEGATIVE ng/mL
METHADONE MTB IA: NEGATIVE ng/mL
Midazolam: NOT DETECTED ng/mg{creat}
Norbuprenorphine: NOT DETECTED ng/mg{creat}
Norfentanyl: NOT DETECTED ng/mg{creat}
OPIATE CLASS IA: NEGATIVE ng/mL
OXYCODONE CLASS IA: NEGATIVE ng/mL
Oxazepam: NOT DETECTED ng/mg{creat}
PHENCYCLIDINE IA: NEGATIVE ng/mL
TAPENTADOL, IA: NEGATIVE ng/mL
TRAMADOL IA: NEGATIVE ng/mL
Temazepam: NOT DETECTED ng/mg{creat}

## 2024-07-02 LAB — CANNABINOIDS, MS, UR RFX
Cannabinoids Confirmation: POSITIVE
Carboxy-THC: 252 ng/mg{creat}

## 2024-07-23 ENCOUNTER — Encounter: Payer: Self-pay | Admitting: Nurse Practitioner

## 2024-09-28 ENCOUNTER — Encounter: Payer: Self-pay | Admitting: Nurse Practitioner
